# Patient Record
Sex: Female | Born: 1950 | Race: Asian | Hispanic: No | Marital: Married | Smoking: Never smoker
Health system: Southern US, Community
[De-identification: ages and names within clinical notes are randomized; demographics above are authoritative.]

## PROBLEM LIST (undated history)

## (undated) DIAGNOSIS — I272 Pulmonary hypertension, unspecified: Secondary | ICD-10-CM

## (undated) DIAGNOSIS — I2510 Atherosclerotic heart disease of native coronary artery without angina pectoris: Secondary | ICD-10-CM

## (undated) DIAGNOSIS — IMO0002 Reserved for concepts with insufficient information to code with codable children: Secondary | ICD-10-CM

## (undated) DIAGNOSIS — I5022 Chronic systolic (congestive) heart failure: Secondary | ICD-10-CM

## (undated) DIAGNOSIS — I10 Essential (primary) hypertension: Secondary | ICD-10-CM

## (undated) DIAGNOSIS — E1142 Type 2 diabetes mellitus with diabetic polyneuropathy: Secondary | ICD-10-CM

## (undated) DIAGNOSIS — D696 Thrombocytopenia, unspecified: Secondary | ICD-10-CM

## (undated) DIAGNOSIS — D649 Anemia, unspecified: Secondary | ICD-10-CM

## (undated) DIAGNOSIS — R609 Edema, unspecified: Secondary | ICD-10-CM

## (undated) DIAGNOSIS — I38 Endocarditis, valve unspecified: Secondary | ICD-10-CM

## (undated) DIAGNOSIS — R188 Other ascites: Secondary | ICD-10-CM

## (undated) DIAGNOSIS — I509 Heart failure, unspecified: Secondary | ICD-10-CM

## (undated) DIAGNOSIS — E46 Unspecified protein-calorie malnutrition: Secondary | ICD-10-CM

## (undated) DIAGNOSIS — E1165 Type 2 diabetes mellitus with hyperglycemia: Secondary | ICD-10-CM

## (undated) DIAGNOSIS — I472 Ventricular tachycardia: Secondary | ICD-10-CM

## (undated) DIAGNOSIS — N390 Urinary tract infection, site not specified: Secondary | ICD-10-CM

## (undated) DIAGNOSIS — N184 Chronic kidney disease, stage 4 (severe): Secondary | ICD-10-CM

## (undated) DIAGNOSIS — Z659 Problem related to unspecified psychosocial circumstances: Secondary | ICD-10-CM

## (undated) DIAGNOSIS — Z609 Problem related to social environment, unspecified: Secondary | ICD-10-CM

## (undated) DIAGNOSIS — N189 Chronic kidney disease, unspecified: Secondary | ICD-10-CM

## (undated) DIAGNOSIS — I255 Ischemic cardiomyopathy: Secondary | ICD-10-CM

## (undated) DIAGNOSIS — J189 Pneumonia, unspecified organism: Secondary | ICD-10-CM

## (undated) HISTORY — PX: TUBAL LIGATION: SHX77

---

## 2011-04-10 ENCOUNTER — Emergency Department (HOSPITAL_COMMUNITY)
Admission: EM | Admit: 2011-04-10 | Discharge: 2011-04-10 | Disposition: A | Discharge status: Home / Self Care | Payer: Self-pay | Attending: Emergency Medicine | Admitting: Emergency Medicine

## 2011-04-10 ENCOUNTER — Encounter (HOSPITAL_COMMUNITY): Payer: Self-pay | Admitting: Emergency Medicine

## 2011-04-10 ENCOUNTER — Emergency Department (HOSPITAL_COMMUNITY): Payer: Self-pay

## 2011-04-10 DIAGNOSIS — E119 Type 2 diabetes mellitus without complications: Secondary | ICD-10-CM | POA: Insufficient documentation

## 2011-04-10 DIAGNOSIS — J40 Bronchitis, not specified as acute or chronic: Secondary | ICD-10-CM | POA: Insufficient documentation

## 2011-04-10 DIAGNOSIS — B349 Viral infection, unspecified: Secondary | ICD-10-CM

## 2011-04-10 DIAGNOSIS — R05 Cough: Secondary | ICD-10-CM | POA: Insufficient documentation

## 2011-04-10 DIAGNOSIS — R739 Hyperglycemia, unspecified: Secondary | ICD-10-CM

## 2011-04-10 DIAGNOSIS — B9789 Other viral agents as the cause of diseases classified elsewhere: Secondary | ICD-10-CM | POA: Insufficient documentation

## 2011-04-10 DIAGNOSIS — R059 Cough, unspecified: Secondary | ICD-10-CM | POA: Insufficient documentation

## 2011-04-10 DIAGNOSIS — R0602 Shortness of breath: Secondary | ICD-10-CM | POA: Insufficient documentation

## 2011-04-10 DIAGNOSIS — Z794 Long term (current) use of insulin: Secondary | ICD-10-CM | POA: Insufficient documentation

## 2011-04-10 HISTORY — DX: Essential (primary) hypertension: I10

## 2011-04-10 LAB — DIFFERENTIAL
Basophils Absolute: 0.1 10*3/uL (ref 0.0–0.1)
Eosinophils Absolute: 0.1 10*3/uL (ref 0.0–0.7)
Monocytes Absolute: 0.5 10*3/uL (ref 0.1–1.0)
Neutrophils Relative %: 74 % (ref 43–77)

## 2011-04-10 LAB — POCT I-STAT, CHEM 8
Creatinine, Ser: 0.9 mg/dL (ref 0.50–1.10)
HCT: 44 % (ref 36.0–46.0)
Hemoglobin: 15 g/dL (ref 12.0–15.0)
Sodium: 132 mEq/L — ABNORMAL LOW (ref 135–145)
TCO2: 25 mmol/L (ref 0–100)

## 2011-04-10 LAB — CBC
MCH: 29.7 pg (ref 26.0–34.0)
MCHC: 34.9 g/dL (ref 30.0–36.0)
Platelets: 217 10*3/uL (ref 150–400)
RDW: 12 % (ref 11.5–15.5)

## 2011-04-10 LAB — GLUCOSE, CAPILLARY
Glucose-Capillary: 302 mg/dL — ABNORMAL HIGH (ref 70–99)
Glucose-Capillary: 256 mg/dL — ABNORMAL HIGH (ref 70–99)

## 2011-04-10 MED ORDER — METFORMIN HCL 1000 MG PO TABS: 500.0000 mg | ORAL_TABLET | Freq: Every day | ORAL | Status: DC

## 2011-04-10 MED ORDER — ONDANSETRON 4 MG PO TBDP
4.0000 mg | ORAL_TABLET | Freq: Once | ORAL | Status: AC
  Administered 2011-04-10: 4 mg via ORAL
  Filled 2011-04-10: qty 1

## 2011-04-10 MED ORDER — SODIUM CHLORIDE 0.9 % IV SOLN
INTRAVENOUS | Status: DC
  Administered 2011-04-10: 100 mL via INTRAVENOUS

## 2011-04-10 MED ORDER — DM-GUAIFENESIN ER 30-600 MG PO TB12
1.0000 | ORAL_TABLET | Freq: Two times a day (BID) | ORAL | Status: DC
  Administered 2011-04-10 (×2): 1 via ORAL
  Filled 2011-04-10 (×4): qty 1

## 2011-04-10 MED ORDER — IBUPROFEN 200 MG PO TABS
400.0000 mg | ORAL_TABLET | Freq: Once | ORAL | Status: AC
  Administered 2011-04-10: 400 mg via ORAL
  Filled 2011-04-10: qty 2

## 2011-04-10 MED ORDER — SODIUM CHLORIDE 0.9 % IV BOLUS (SEPSIS)
750.0000 mL | INTRAVENOUS | Status: AC
  Administered 2011-04-10: 750 mL via INTRAVENOUS

## 2011-04-10 MED ORDER — INSULIN ASPART 100 UNIT/ML ~~LOC~~ SOLN
8.0000 [IU] | Freq: Once | SUBCUTANEOUS | Status: AC
  Administered 2011-04-10: 8 [IU] via SUBCUTANEOUS
  Filled 2011-04-10: qty 1

## 2011-04-10 NOTE — ED Notes (Signed)
Patient returned from X-ray 

## 2011-04-10 NOTE — ED Notes (Signed)
Pt presented to the ER with c/o shortness of breath, generalize body aches, pt states s/s started couple of days ago. Pt with hx of HTN, not on any medications, states using diet to gain control.

## 2011-04-10 NOTE — ED Provider Notes (Cosign Needed)
History     CSN: [id number]  Arrival date & time 04/10/11  0348   First MD Initiated Contact with Patient 04/10/11 [phone]      Chief Complaint  Patient presents with  . Shortness of Breath  . Cough  . Generalized Body Aches   Patient's son interprets, patient has limited English  (Consider location/radiation/quality/duration/timing/severity/associated sxs/prior treatment) HPI  Patient relates 2 days ago she started having a burning all over her body. She has had a cough with green mucus. She states her chest hurts when she coughs. She also feels short of breath. She denies fever or shortness of breath. She's had nausea and also had some posttussive vomiting. She denies diarrhea. They report her grandson has had similar symptoms recently.  PCP none  Past Medical History  Diagnosis Date  . Diabetes mellitus   . Hypertension     Past Surgical History  Procedure Date  . Cesarean section     History reviewed. No pertinent family history.  History  Substance Use Topics  . Smoking status: Never Smoker   . Smokeless tobacco: Not on file  . Alcohol Use: Yes   lives with spouse  OB History    Grav Para Term Preterm Abortions TAB SAB Ect Mult Living                  Review of Systems  All other systems reviewed and are negative.    Allergies  Review of patient's allergies indicates no known allergies.  Home Medications   Current Outpatient Rx  Name Route Sig Dispense Refill  . METFORMIN HCL 1000 MG PO TABS Oral Take 0.5 tablets (500 mg total) by mouth daily with breakfast. 30 tablet 0    BP 183/89  Pulse 86  Temp(Src) 97.8 F (37.7 C) (Oral)  Resp 18  SpO2 98%  Physical Exam  Nursing note and vitals reviewed. Constitutional: She is oriented to person, place, and time. She appears well-developed and well-nourished.  Non-toxic appearance. She does not appear ill. No distress.       Patient looks like she feels bad she has cough at times  HENT:  Head:  Normocephalic and atraumatic.  Right Ear: External ear normal.  Left Ear: External ear normal.  Nose: Nose normal. No mucosal edema or rhinorrhea.  Mouth/Throat: Oropharynx is clear and moist and mucous membranes are normal. No dental abscesses or uvula swelling.  Eyes: Conjunctivae and EOM are normal. Pupils are equal, round, and reactive to light.  Neck: Normal range of motion and full passive range of motion without pain. Neck supple.  Cardiovascular: Normal rate, regular rhythm and normal heart sounds.  Exam reveals no gallop and no friction rub.   No murmur heard. Pulmonary/Chest: Effort normal and breath sounds normal. No respiratory distress. She has no wheezes. She has no rhonchi. She has no rales. She exhibits no tenderness and no crepitus.  Abdominal: Soft. Normal appearance and bowel sounds are normal. She exhibits no distension. There is no tenderness. There is no rebound and no guarding.  Musculoskeletal: Normal range of motion. She exhibits no edema and no tenderness.       Moves all extremities well.   Neurological: She is alert and oriented to person, place, and time. She has normal strength. No cranial nerve deficit.  Skin: Skin is warm, dry and intact. No rash noted. No erythema. No pallor.  Psychiatric: Her speech is normal and behavior is normal. Her mood appears not anxious.  Flat affect    ED Course  Procedures (including critical care time)   Medications  metFORMIN (GLUCOPHAGE) 1000 MG tablet (not administered)  ondansetron (ZOFRAN-ODT) disintegrating tablet 4 mg (4 mg Oral Given 04/10/11 0520)  ibuprofen (ADVIL,MOTRIN) tablet 400 mg (400 mg Oral Given 04/10/11 0653)  sodium chloride 0.9 % bolus 750 mL (750 mL Intravenous Given 04/10/11 0804)  insulin aspart (novoLOG) injection 8 Units (8 Units Subcutaneous Given 04/10/11 1002)  sodium chloride 0.9 % bolus 750 mL (750 mL Intravenous Given 04/10/11 1001)   Patient reported feeling burning all over. Her temperature was  rechecked and had gone up to 99.8 her blood pressure improved to 153/75.  Pt was diagnosed with diabetes 2 years ago and has only been on diet control.   Pt given IV fluids and ISTAT 8 and CBC pending.   Turned over to Dr Freida Busman at change of shift to stabilize her blood glucose.    Results for orders placed during the hospital encounter of 04/10/11  GLUCOSE, CAPILLARY      Component Value Range   Glucose-Capillary 398 (*) 70 - 99 (mg/dL)   Comment 1 Documented in Chart     Comment 2 Notify RN    GLUCOSE, CAPILLARY      Component Value Range   Glucose-Capillary 407 (*) 70 - 99 (mg/dL)  CBC      Component Value Range   WBC 8.4  4.0 - 10.5 (K/uL)   RBC 4.08  3.87 - 5.11 (MIL/uL)   Hemoglobin 12.1  12.0 - 15.0 (g/dL)   HCT 62.1 (*) 30.8 - 46.0 (%)   MCV 85.0  78.0 - 100.0 (fL)   MCH 29.7  26.0 - 34.0 (pg)   MCHC 34.9  30.0 - 36.0 (g/dL)   RDW 65.7  84.6 - 96.2 (%)   Platelets 217  150 - 400 (K/uL)  DIFFERENTIAL      Component Value Range   Neutrophils Relative 74  43 - 77 (%)   Lymphocytes Relative 18  12 - 46 (%)   Monocytes Relative 6  3 - 12 (%)   Eosinophils Relative 1  0 - 5 (%)   Basophils Relative 1  0 - 1 (%)   Neutro Abs 6.2  1.7 - 7.7 (K/uL)   Lymphs Abs 1.5  0.7 - 4.0 (K/uL)   Monocytes Absolute 0.5  0.1 - 1.0 (K/uL)   Eosinophils Absolute 0.1  0.0 - 0.7 (K/uL)   Basophils Absolute 0.1  0.0 - 0.1 (K/uL)   WBC Morphology TOXIC GRANULATION    POCT I-STAT, CHEM 8      Component Value Range   Sodium 132 (*) 135 - 145 (mEq/L)   Potassium 4.8  3.5 - 5.1 (mEq/L)   Chloride 100  96 - 112 (mEq/L)   BUN 18  6 - 23 (mg/dL)   Creatinine, Ser 9.52  0.50 - 1.10 (mg/dL)   Glucose, Bld 841 (*) 70 - 99 (mg/dL)   Calcium, Ion 3.24  4.01 - 1.32 (mmol/L)   TCO2 25  0 - 100 (mmol/L)   Hemoglobin 15.0  12.0 - 15.0 (g/dL)   HCT 02.7  25.3 - 66.4 (%)  GLUCOSE, CAPILLARY      Component Value Range   Glucose-Capillary 302 (*) 70 - 99 (mg/dL)  GLUCOSE, CAPILLARY      Component  Value Range   Glucose-Capillary 256 (*) 70 - 99 (mg/dL)   Dg Chest 2 View  4/0/3474  *RADIOLOGY REPORT*  Clinical Data: Cough.  CHEST - 2 VIEW  Comparison: None.  Findings: The lungs are well-aerated and clear.  There is no evidence of focal opacification, pleural effusion or pneumothorax.  The heart is borderline normal in size; the mediastinal contour is within normal limits.  No acute osseous abnormalities are seen.  IMPRESSION: No acute cardiopulmonary process seen.  Original Report Authenticated By: Tonia Ghent, M.D.      1. Bronchitis   2. Viral illness   3. Hyperglycemia     Plan probable discharge by Dr Freida Busman after stabilizing her serum glucose  New Prescriptions   METFORMIN (GLUCOPHAGE) 1000 MG TABLET    Take 0.5 tablets (500 mg total) by mouth daily with breakfast.    Devoria Albe, MD, FACEP   MDM          Ward Givens, MD 04/10/11 1800

## 2011-04-10 NOTE — ED Provider Notes (Signed)
Pt signed out to me by dr. Lynelle Doctor, pt given insulin and iv fluids, repeat cbgs show improvement, will start meds for hyperglycemia and she has f/u at [facility]  Toy Baker, MD 04/10/11 1137

## 2011-04-10 NOTE — ED Notes (Signed)
Pt given fluid 

## 2012-08-07 DIAGNOSIS — I509 Heart failure, unspecified: Secondary | ICD-10-CM

## 2012-08-07 DIAGNOSIS — D649 Anemia, unspecified: Secondary | ICD-10-CM

## 2012-08-07 HISTORY — DX: Heart failure, unspecified: I50.9

## 2012-08-07 HISTORY — DX: Anemia, unspecified: D64.9

## 2012-08-22 ENCOUNTER — Emergency Department (HOSPITAL_COMMUNITY): Payer: Self-pay

## 2012-08-22 ENCOUNTER — Inpatient Hospital Stay (HOSPITAL_COMMUNITY)
Admission: EM | Admit: 2012-08-22 | Discharge: 2012-08-23 | DRG: 292 | Disposition: A | Discharge status: Home / Self Care | Payer: Medicaid Other | Attending: Internal Medicine | Admitting: Internal Medicine

## 2012-08-22 ENCOUNTER — Encounter (HOSPITAL_COMMUNITY): Payer: Self-pay | Admitting: Emergency Medicine

## 2012-08-22 DIAGNOSIS — Z9119 Patient's noncompliance with other medical treatment and regimen: Secondary | ICD-10-CM

## 2012-08-22 DIAGNOSIS — E1142 Type 2 diabetes mellitus with diabetic polyneuropathy: Secondary | ICD-10-CM | POA: Diagnosis present

## 2012-08-22 DIAGNOSIS — I509 Heart failure, unspecified: Secondary | ICD-10-CM

## 2012-08-22 DIAGNOSIS — T502X5A Adverse effect of carbonic-anhydrase inhibitors, benzothiadiazides and other diuretics, initial encounter: Secondary | ICD-10-CM | POA: Diagnosis present

## 2012-08-22 DIAGNOSIS — D649 Anemia, unspecified: Secondary | ICD-10-CM | POA: Diagnosis present

## 2012-08-22 DIAGNOSIS — E875 Hyperkalemia: Secondary | ICD-10-CM | POA: Diagnosis present

## 2012-08-22 DIAGNOSIS — I059 Rheumatic mitral valve disease, unspecified: Secondary | ICD-10-CM

## 2012-08-22 DIAGNOSIS — I5033 Acute on chronic diastolic (congestive) heart failure: Principal | ICD-10-CM | POA: Diagnosis present

## 2012-08-22 DIAGNOSIS — E1149 Type 2 diabetes mellitus with other diabetic neurological complication: Secondary | ICD-10-CM | POA: Diagnosis present

## 2012-08-22 DIAGNOSIS — D638 Anemia in other chronic diseases classified elsewhere: Secondary | ICD-10-CM | POA: Diagnosis present

## 2012-08-22 DIAGNOSIS — A498 Other bacterial infections of unspecified site: Secondary | ICD-10-CM | POA: Diagnosis present

## 2012-08-22 DIAGNOSIS — R319 Hematuria, unspecified: Secondary | ICD-10-CM | POA: Diagnosis present

## 2012-08-22 DIAGNOSIS — E119 Type 2 diabetes mellitus without complications: Secondary | ICD-10-CM | POA: Diagnosis present

## 2012-08-22 DIAGNOSIS — N179 Acute kidney failure, unspecified: Secondary | ICD-10-CM

## 2012-08-22 DIAGNOSIS — Z91199 Patient's noncompliance with other medical treatment and regimen due to unspecified reason: Secondary | ICD-10-CM

## 2012-08-22 DIAGNOSIS — N390 Urinary tract infection, site not specified: Secondary | ICD-10-CM

## 2012-08-22 DIAGNOSIS — I10 Essential (primary) hypertension: Secondary | ICD-10-CM | POA: Diagnosis present

## 2012-08-22 HISTORY — DX: Type 2 diabetes mellitus with diabetic polyneuropathy: E11.42

## 2012-08-22 HISTORY — DX: Heart failure, unspecified: I50.9

## 2012-08-22 HISTORY — DX: Urinary tract infection, site not specified: N39.0

## 2012-08-22 HISTORY — DX: Pneumonia, unspecified organism: J18.9

## 2012-08-22 HISTORY — DX: Anemia, unspecified: D64.9

## 2012-08-22 LAB — RETICULOCYTES
Retic Count, Absolute: 95.7 10*3/uL (ref 19.0–186.0)
Retic Ct Pct: 3.3 % — ABNORMAL HIGH (ref 0.4–3.1)

## 2012-08-22 LAB — TROPONIN I: Troponin I: <0.3 ng/mL (ref <0.30)

## 2012-08-22 LAB — FERRITIN: Ferritin: 242 ng/mL (ref 10–291)

## 2012-08-22 LAB — COMPREHENSIVE METABOLIC PANEL
BUN: 22 mg/dL (ref 6–23)
CO2: 23 mEq/L (ref 19–32)
Calcium: 9.4 mg/dL (ref 8.4–10.5)
Chloride: 104 mEq/L (ref 96–112)
Creatinine, Ser: 1.05 mg/dL (ref 0.50–1.10)
GFR calc non Af Amer: 56 mL/min — ABNORMAL LOW (ref >90)
Total Bilirubin: 0.4 mg/dL (ref 0.3–1.2)

## 2012-08-22 LAB — PRO B NATRIURETIC PEPTIDE: Pro B Natriuretic peptide (BNP): 6331 pg/mL — ABNORMAL HIGH (ref 0–125)

## 2012-08-22 LAB — GLUCOSE, CAPILLARY: Glucose-Capillary: 149 mg/dL — ABNORMAL HIGH (ref 70–99)

## 2012-08-22 LAB — CBC
HCT: 25.9 % — ABNORMAL LOW (ref 36.0–46.0)
Hemoglobin: 8.8 g/dL — ABNORMAL LOW (ref 12.0–15.0)
MCH: 29.9 pg (ref 26.0–34.0)
MCHC: 34 g/dL (ref 30.0–36.0)
MCV: 88.1 fL (ref 78.0–100.0)
Platelets: 188 10*3/uL (ref 150–400)
RBC: 2.94 MIL/uL — ABNORMAL LOW (ref 3.87–5.11)
RDW: 14.2 % (ref 11.5–15.5)
WBC: 4.5 10*3/uL (ref 4.0–10.5)

## 2012-08-22 LAB — POCT I-STAT, CHEM 8
Chloride: 109 mEq/L (ref 96–112)
Glucose, Bld: 114 mg/dL — ABNORMAL HIGH (ref 70–99)
HCT: 27 % — ABNORMAL LOW (ref 36.0–46.0)
Hemoglobin: 9.2 g/dL — ABNORMAL LOW (ref 12.0–15.0)
Potassium: 5.2 mEq/L — ABNORMAL HIGH (ref 3.5–5.1)

## 2012-08-22 LAB — URINALYSIS, ROUTINE W REFLEX MICROSCOPIC
Bilirubin Urine: NEGATIVE
Ketones, ur: NEGATIVE mg/dL
Nitrite: POSITIVE — AB
Specific Gravity, Urine: 1.01 (ref 1.005–1.030)
Urobilinogen, UA: 0.2 mg/dL (ref 0.0–1.0)

## 2012-08-22 LAB — SODIUM, URINE, RANDOM: Sodium, Ur: 68 mEq/L

## 2012-08-22 LAB — OCCULT BLOOD, POC DEVICE: Fecal Occult Bld: NEGATIVE

## 2012-08-22 LAB — MAGNESIUM: Magnesium: 1.9 mg/dL (ref 1.5–2.5)

## 2012-08-22 LAB — PHOSPHORUS: Phosphorus: 4.9 mg/dL — ABNORMAL HIGH (ref 2.3–4.6)

## 2012-08-22 LAB — IRON AND TIBC: Iron: 47 ug/dL (ref 42–135)

## 2012-08-22 LAB — VITAMIN B12: Vitamin B-12: 453 pg/mL (ref 211–911)

## 2012-08-22 MED ORDER — DEXTROSE 5 % IV SOLN
1.0000 g | Freq: Once | INTRAVENOUS | Status: AC
  Administered 2012-08-22: 1 g via INTRAVENOUS
  Filled 2012-08-22: qty 10

## 2012-08-22 MED ORDER — ACETAMINOPHEN 650 MG RE SUPP: 650.0000 mg | Freq: Four times a day (QID) | RECTAL | Status: DC | PRN

## 2012-08-22 MED ORDER — ACETAMINOPHEN 325 MG PO TABS: 650.0000 mg | ORAL_TABLET | Freq: Four times a day (QID) | ORAL | Status: DC | PRN

## 2012-08-22 MED ORDER — CEFTRIAXONE SODIUM 1 G IJ SOLR: 1.0000 g | Freq: Once | INTRAMUSCULAR | Status: DC

## 2012-08-22 MED ORDER — ONDANSETRON HCL 4 MG/2ML IJ SOLN: 4.0000 mg | Freq: Four times a day (QID) | INTRAMUSCULAR | Status: DC | PRN

## 2012-08-22 MED ORDER — ONDANSETRON HCL 4 MG PO TABS: 4.0000 mg | ORAL_TABLET | Freq: Four times a day (QID) | ORAL | Status: DC | PRN

## 2012-08-22 MED ORDER — AMLODIPINE BESYLATE 5 MG PO TABS
5.0000 mg | ORAL_TABLET | Freq: Every day | ORAL | Status: DC
  Administered 2012-08-22 – 2012-08-23 (×2): 5 mg via ORAL
  Filled 2012-08-22 (×2): qty 1

## 2012-08-22 MED ORDER — HYDRALAZINE HCL 20 MG/ML IJ SOLN
5.0000 mg | Freq: Once | INTRAMUSCULAR | Status: AC
  Administered 2012-08-22: 5 mg via INTRAVENOUS
  Filled 2012-08-22: qty 1

## 2012-08-22 MED ORDER — INSULIN ASPART 100 UNIT/ML ~~LOC~~ SOLN: 0.0000 [IU] | Freq: Three times a day (TID) | SUBCUTANEOUS | Status: DC

## 2012-08-22 MED ORDER — FUROSEMIDE 10 MG/ML IJ SOLN
40.0000 mg | Freq: Once | INTRAMUSCULAR | Status: AC
  Administered 2012-08-22: 40 mg via INTRAVENOUS
  Filled 2012-08-22: qty 4

## 2012-08-22 MED ORDER — CIPROFLOXACIN HCL 500 MG PO TABS
500.0000 mg | ORAL_TABLET | Freq: Two times a day (BID) | ORAL | Status: DC
  Administered 2012-08-23: 500 mg via ORAL
  Filled 2012-08-22 (×3): qty 1

## 2012-08-22 MED ORDER — FUROSEMIDE 40 MG PO TABS
40.0000 mg | ORAL_TABLET | Freq: Two times a day (BID) | ORAL | Status: DC
  Administered 2012-08-22: 40 mg via ORAL
  Filled 2012-08-22 (×4): qty 1

## 2012-08-22 MED ORDER — ENOXAPARIN SODIUM 40 MG/0.4ML ~~LOC~~ SOLN
40.0000 mg | SUBCUTANEOUS | Status: DC
  Administered 2012-08-22: 40 mg via SUBCUTANEOUS
  Filled 2012-08-22 (×2): qty 0.4

## 2012-08-22 MED ORDER — HYDRALAZINE HCL 20 MG/ML IJ SOLN
10.0000 mg | INTRAMUSCULAR | Status: DC | PRN
  Administered 2012-08-22: 10 mg via INTRAVENOUS
  Filled 2012-08-22: qty 1

## 2012-08-22 MED ORDER — SODIUM CHLORIDE 0.9 % IJ SOLN
3.0000 mL | Freq: Two times a day (BID) | INTRAMUSCULAR | Status: DC
  Administered 2012-08-22: 3 mL via INTRAVENOUS

## 2012-08-22 NOTE — ED Notes (Signed)
PA Gershon Mussel at bedside for hemoccult.

## 2012-08-22 NOTE — ED Notes (Signed)
Internal medicine consult at bedside.

## 2012-08-22 NOTE — ED Notes (Signed)
Late entry: PA Gershon Mussel made aware of pt's BP.

## 2012-08-22 NOTE — ED Notes (Signed)
MD at bedside. 

## 2012-08-22 NOTE — Consult Note (Signed)
CARDIOLOGY CONSULT NOTE  Patient ID: [name] [name], MRN: [id number], DOB/AGE: [birth year] 62 y.o. Admit date: 08/22/2012   Date of Consult: 08/22/2012 Primary Physician: No primary provider on file. Primary Cardiologist: New to [place], being seen by Dr. Elease Hashimoto  Chief Complaint: SOB and cough Reason for Consult: ?CHF  HPI: [name] [name] is a 62 y/o [place] F with a h/o DM and HTN and no prior cardiac history who presents today c/o worsened SOB and cough x 2 weeks. Much of history is provided by patient's daughter due to language barrier; information provided by patient was translated by her adult daughter, which patient consented to.   Her symptoms date back to last year, but have been worse over the last 2 weeks. The patient was seen in ED on 04/2011 c/o productive cough, SOB, and posttussive vomiting. During this visit, DM and HTN were addressed as she was started on Metformin and Lisinopril. Her respiratory symptoms did not improve so soon after the ED visit she went to urgent care and was diagnosed with pneumonia. Apparently since this time she has had intermittent SOB with cough and has experienced difficulty performing ADLs such as grocery shopping dt fatigue and SOB. She continues to take Metformin which daughter pays for, but has not taken Lisinopril in at least 1 year due to financial status. For the past 2 months, she has noted mild LE edema R>L after standing or walking for a period of time. For the last 2 weeks, her SOB and cough have worsened and become more persistent. Walking increases SOB and rest alleviates dyspnea. For past 1 week, patient has had orthopnea. Does not weigh herself at home. Denies chest pain, palpitations, PND, dizziness, abdominal pain, n/v, hematuria, dark urine, or dysuria. Attests to frequent urination (Q 1hr) with only small amount of urine voided, but this is unchanged in past year. Also reports vision changes and sensitivity to light, which daughter attributes to diabetes.  She has not been able to afford to see ophthalmologist or endocrinologist.   Past Medical History  Diagnosis Date  . Diabetes mellitus     Reportedly diagnosed 2011 but no medication initiated until 04/2011 (Metformin)  . Hypertension   . Pneumonia   . Diabetic peripheral neuropathy     Since 2013      Most Recent Cardiac Studies: None   Surgical History:  Past Surgical History  Procedure Laterality Date  . Cesarean section       Home Meds: Prior to Admission medications   Medication Sig Start Date End Date Taking? Authorizing Provider  metFORMIN (GLUCOPHAGE) 1000 MG tablet Take 0.5 tablets (500 mg total) by mouth daily with breakfast. 04/10/11 08/22/12 Yes Toy Baker, MD  OVER THE COUNTER MEDICATION Take 1 tablet by mouth 2 (two) times daily. One a day vitamin for diabetics   Yes Historical Provider, MD  OVER THE COUNTER MEDICATION Take 15 mLs by mouth daily as needed (cough). Over the counter cough syrup   Yes Historical Provider, MD    Inpatient Medications:     Allergies: No Known Allergies  History   Social History  . Marital Status: Married    Spouse Name: N/A    Number of Children: N/A  . Years of Education: N/A   Occupational History  . Not on file.   Social History Main Topics  . Smoking status: Never Smoker   . Smokeless tobacco: Not on file  . Alcohol Use: No  . Drug Use: No  . Sexually Active:  Not on file   Other Topics Concern  . Not on file   Social History Narrative   Patient lives with husband and son. Currently without insurance and therefore tries not seek medical care dt financial concern.      Family History  Problem Relation Age of Onset  . Diabetes Brother     deceased in 20s dt DM complications   Review of Systems: General: negative for chills, fever.  Cardiovascular: +++ orthopnea and SOB. negative for chest pain, palpitations, paroxysmal nocturnal dyspnea. Respiratory: +++ cough. Urologic: negative for hematuria or  dysuria. Abdominal: negative for nausea, vomiting, diarrhea, bright red blood per rectum, melena. Neurologic: negative for syncope or dizziness. All other systems reviewed and are otherwise negative except as noted above.  Labs:  Recent Labs  08/22/12 0930  TROPONINI <0.30   Pro-BNP: 6331.0  Lab Results  Component Value Date   WBC 4.5 08/22/2012   HGB 8.8* 08/22/2012   HCT 25.9* 08/22/2012   MCV 88.1 08/22/2012   PLT 188 08/22/2012     Recent Labs Lab 08/22/12 0917  NA 141  K 5.2*  CL 109  BUN 23  CREATININE 1.30*  GLUCOSE 114*   Radiology/Studies:  Dg Chest 2 View  08/22/2012   *RADIOLOGY REPORT*  Clinical Data: Shortness of breath.  CHEST - 2 VIEW  Comparison: 04/10/2011  Findings: There is evidence of mild acute interstitial edema without significant pleural effusion.  The heart is mildly enlarged.  No focal pulmonary consolidation is seen.  IMPRESSION: Interstitial edema.   Original Report Authenticated By: Irish Lack, M.D.    EKG: NSR 72bpm minimal ST depression II, V5-V6 no prior to compare to  Physical Exam: Blood pressure 209/79, pulse 67, temperature 98.3 F (36.8 C), temperature source Oral, resp. rate 13, height 5' (1.524 m), weight 124 lb (56.246 kg), SpO2 97.00%. General: Well developed, well nourished thin asian F in no acute distress. Head: Normocephalic, atraumatic. Neck: Negative for carotid bruits. Mild JVD. Lungs: Diminished breath sounds at bases with bibasilar rales initially but after lasix has no rales. No wheezing or rhonchi. Breathing is unlabored. Heart: RRR with S1 S2. No murmurs, rubs, or gallops appreciated. Abdomen: Soft, non-tender, non-distended with normoactive bowel sounds. Extremities: No clubbing or cyanosis. No edema.  Distal pedal pulses are 1+ and equal bilaterally. Neuro: Alert and oriented X 3. No facial asymmetry. No focal deficit.  Psych:  Responds to questions appropriately (with daughter translating) with a normal affect.       Assessment and Plan:  1. Acute heart failure, type unknown: Patient to be admitted by internal medicine and cardiology will continue to consult. Suspect dyspnea is multifactorial with anemia also playing a role. Plan echo and improved blood pressure control (?diastolic CHF). She already got 1 dose of IV Lasix in the ER and will follow clinically before dosing additional diuretic. May need ischemic workup depending on echo results. Recommend strict I&O's, daily weights, TSH. Address hypertension with addition of Norvasc today. Avoid beta blocker at present given HR in the 60s - it is actually surprising that she is NOT tachycardic in the setting of her anemia/CHF.  2. HTN: Untreated and likely long-standing. Monitor BP trend after Lasix initiated. Would avoid ACEI acutely given elevated creatinine and hyperkalemia. Add Norvasc as above.  3. Acute anemia: Hgb: 8.8 (was 15g/dL on 02/13/08). Hemoccult negative but moderate Hgb on UA. May be contributing to increased SOB. Anemia panel pending. Consider blood transfusion. Internal medicine to follow. Surprisingly she is  not tachycardic with this, so question slower loss.  4. Acute renal insufficiency: Creatinine 1.3. Watch with diuresis. Further w/u per IM given abnormal UA with proteinuria and hematuria.  5. Hyperkalemia: Potassium 5.2 (was 4.8 on 04/10/11). No peaked T waves on EKG. This will likely correct with diuresis via Lasix. Plan to reassess with BMET. Not sure she is a candidate for ACEI in the future as prior K was also on high range.  6. DM: Appears well-controlled, especially considering glucose on CMET was 433 on last hospital visit 04/10/2011. Daughter reports BG levels at home are 100s-120s. CBG this morning 114. Consider discontinuing Metformin acutely while renal insufficiency is present. Recommend sliding-scale insulin.    7. Abnormal UA: Nitrite positive indicating UTI thus ceftriaxone initiated. Pt also has significant proteinuria as  well as hematuria. Further workup per IM.   Signed, Nyra Jabs PA-S, Patient seen with Ronie Spies PA-C 08/22/2012, 3:39 PM  Attending Note:   The patient was seen and examined.  Agree with assessment and plan as noted above.  Changes made to the above note as needed.  The patient has had progressive DOE for the past year.  In addition, she had become significantly anemic.  She has had HTN for at least a year - I suspect much longer.    Will get the results of the echo.  I expect her to have diastolic dysfunction and possible some systolic dysfunction - she does not have an S3 on exam.    Continue with BP control.  I would not want to necessarily decrease her BP to the  120 range - I suspect she is more accustomed to the 180-200 range.  We should aim for a goal of 150 in the next day or so.    Vesta Mixer, Montez Hageman., MD, [facility] 08/22/2012, 5:38 PM

## 2012-08-22 NOTE — ED Provider Notes (Signed)
History    CSN: [id number] Arrival date & time 08/22/12  2956  First MD Initiated Contact with Patient 08/22/12 [phone]     Chief Complaint  Patient presents with  . Cough  . Shortness of Breath   (Consider location/radiation/quality/duration/timing/severity/associated sxs/prior Treatment) Patient is a 62 y.o. female presenting with cough and shortness of breath. The history is provided by the patient and a relative. The history is limited by a language barrier. Language interpreter used: adult daughter.  Cough Associated symptoms: no chest pain, no chills, no diaphoresis, no fever and no shortness of breath   Shortness of Breath Associated symptoms: cough   Associated symptoms: no chest pain, no diaphoresis, no fever and no vomiting    Pt is a 61yo female with hx of chronic intermittent cough BIB adult daughter who states she has c/o worsening cough over the past 2-3 days and worsening SOB, making sleep difficult at night, orthopnea.  Pt was seen at urgent care 1-29mo ago for same, given cough medicine which has not helped. Daughter states she was advised to try OTC cough syrup which also does not help.  Pt tried medicinal coining last night w/o relief.  Denies fever, nausea, vomiting or diarrhea.  Denies recent travel.  No known specific lung problems besides intermittent chronic cough.  Pt has never smoked.  Pt is diabetic and has hx of HTN.  Daughter states CBG is in 100s-120s.  Pt does not take BP meds.      Past Medical History  Diagnosis Date  . Diabetes mellitus   . Hypertension   . Pneumonia    Past Surgical History  Procedure Laterality Date  . Cesarean section     History reviewed. No pertinent family history. History  Substance Use Topics  . Smoking status: Never Smoker   . Smokeless tobacco: Not on file  . Alcohol Use: No   OB History   Grav Para Term Preterm Abortions TAB SAB Ect Mult Living                 Review of Systems  Constitutional: Negative for  fever, chills, diaphoresis and fatigue.  Respiratory: Positive for cough. Negative for shortness of breath.   Cardiovascular: Negative for chest pain and palpitations.  Gastrointestinal: Negative for nausea, vomiting and diarrhea.  All other systems reviewed and are negative.    Allergies  Review of patient's allergies indicates no known allergies.  Home Medications   Current Outpatient Rx  Name  Route  Sig  Dispense  Refill  . metFORMIN (GLUCOPHAGE) 1000 MG tablet   Oral   Take 0.5 tablets (500 mg total) by mouth daily with breakfast.   30 tablet   0   . OVER THE COUNTER MEDICATION   Oral   Take 1 tablet by mouth 2 (two) times daily. One a day vitamin for diabetics         . OVER THE COUNTER MEDICATION   Oral   Take 15 mLs by mouth daily as needed (cough). Over the counter cough syrup          BP 205/81  Pulse 69  Temp(Src) 98.2 F (36.8 C) (Oral)  Resp 19  Ht 5' (1.524 m)  Wt 124 lb (56.246 kg)  BMI 24.22 kg/m2  SpO2 97% Physical Exam  Nursing note and vitals reviewed. Constitutional: She appears well-developed and well-nourished.  Pt lying comfortably in exam bed. Appears mildly fatigued. NAD  HENT:  Head: Normocephalic and atraumatic.  Right Ear:  Hearing, tympanic membrane, external ear and ear canal normal.  Left Ear: Hearing, tympanic membrane, external ear and ear canal normal.  Nose: Nose normal.  Mouth/Throat: Uvula is midline, oropharynx is clear and moist and mucous membranes are normal. No oropharyngeal exudate, posterior oropharyngeal edema, posterior oropharyngeal erythema or tonsillar abscesses.  Eyes: Conjunctivae are normal. No scleral icterus.  Neck: Normal range of motion. Neck supple.  Cardiovascular: Normal rate, regular rhythm and normal heart sounds.   Pulmonary/Chest: Effort normal and breath sounds normal. No respiratory distress. She has no wheezes. She has no rales. She exhibits no tenderness.  Abdominal: Soft. Bowel sounds are  normal. She exhibits no distension and no mass. There is tenderness ( mild, diffuse). There is no rebound and no guarding.  Genitourinary: Rectum normal. Rectal exam shows no external hemorrhoid, no internal hemorrhoid, no fissure, no mass, no tenderness and anal tone normal. Guaiac negative stool.  Musculoskeletal: Normal range of motion.  Neurological: She is alert.  Skin: Skin is warm and dry.  Diffuse, splotchy erythema and hyerpigmentation on upper back (result of 'coining')     ED Course  Procedures (including critical care time) Labs Reviewed  CBC - Abnormal; Notable for the following:    RBC 2.94 (*)    Hemoglobin 8.8 (*)    HCT 25.9 (*)    All other components within normal limits  GLUCOSE, CAPILLARY - Abnormal; Notable for the following:    Glucose-Capillary 105 (*)    All other components within normal limits  PRO B NATRIURETIC PEPTIDE - Abnormal; Notable for the following:    Pro B Natriuretic peptide (BNP) 6331.0 (*)    All other components within normal limits  RETICULOCYTES - Abnormal; Notable for the following:    Retic Ct Pct 3.3 (*)    RBC. 2.90 (*)    All other components within normal limits  POCT I-STAT, CHEM 8 - Abnormal; Notable for the following:    Potassium 5.2 (*)    Creatinine, Ser 1.30 (*)    Glucose, Bld 114 (*)    Hemoglobin 9.2 (*)    HCT 27.0 (*)    All other components within normal limits  TROPONIN I  OCCULT BLOOD X 1 CARD TO LAB, STOOL  VITAMIN B12  FOLATE  IRON AND TIBC  FERRITIN  OCCULT BLOOD, POC DEVICE   Dg Chest 2 View  08/22/2012   *RADIOLOGY REPORT*  Clinical Data: Shortness of breath.  CHEST - 2 VIEW  Comparison: 04/10/2011  Findings: There is evidence of mild acute interstitial edema without significant pleural effusion.  The heart is mildly enlarged.  No focal pulmonary consolidation is seen.  IMPRESSION: Interstitial edema.   Original Report Authenticated By: Irish Lack, M.D.    Date: 08/22/2012  Rate: 72  Rhythm: normal  sinus rhythm  QRS Axis: normal  Intervals: normal  ST/T Wave abnormalities: normal  Conduction Disutrbances:none  Narrative Interpretation:   Old EKG Reviewed: none available   1. CHF (congestive heart failure), unspecified failure chronicity, unspecified type   2. Hypertension   3. Anemia     MDM  Pt has hx of chronic cough but also reported hx of pneumonia.  Hx not concerning for TB.  Will get CXR.  If negative, will refer to pulmonology due to chronic nature of cough, affecting pt's sleep.  Pt does have elevated BP but has hx of HTN and does not take medication.  CXR: interstitial edema.  Due to reported chronic nature of cough, believe pt needs  to f/u with cardiologist and/or pulmonologist.  Added labs for more thorough cardiac workup.     CBC: Hgb: 8.8 (was 15g/dL on 02/13/08), Hct 96.0, (was 44.0% on 04/10/11)  istat chem 8: potassium 5.2 (was 4.8 on 04/10/11) Troponin: 0.00 BNP: 6331.0 Glucose: 105 Hemoccult: neg   Special anemia labs: see above   Will consult cardiology to admit pt for new onset CHF, likely due to uncontrolled HTN and new onset anemia, placing more strain on pt's heart.  Pt does not have PCP or cardiologist.  Spoke with [facility] cardiology, Dr. Leonard Downing agreed to come see pt.    Pt is being admitted by [facility] cardiology for CHF  Junius Finner, PA-C 08/22/12 1240  Addendum: Pt still being evaluated by [facility] cardiology.  In meantime, was found to have UTI.  Will give 1g Rocephin.  [place] is requesting internal medicine admit pt.  Spoke with Dr. Manson Passey with teaching service who agreed to come see pt,  Agreed to admit.   Junius Finner, PA-C 08/22/12 [phone]

## 2012-08-22 NOTE — Progress Notes (Signed)
Patient transferred from ED via stretcher to room 4E05. Patient and daughter oriented to room and call bell system with the help of daughter translating english to [place]. Daughter states that patient can understand English but can not answer in [place]. Patient complains of no pain or discomfort, will continue to monitor to end of shift.

## 2012-08-22 NOTE — H&P (Signed)
Date: 08/22/2012               Patient Name:  [name] [name] MRN: [id number]  DOB: 09-09-1950 Age / Sex: 62 y.o., female   PCP: No primary provider on file.              Medical Service: Internal Medicine Teaching Service              Attending Physician: Dr. Rocco Serene, MD    First Contact: Dr. Darci Needle Pager: [phone]  Second Contact: Dr. Manson Passey  Pager: [phone]            After Hours (After 5p/  First Contact Pager: [phone]  weekends / holidays): Second Contact Pager: [phone]   Chief Complaint:   Shortness of breath  History of Present Illness:   [name] [name] is a 62 year old [place] female with a history of diabetes and uncontrolled hypertension, who presents with shortness of breath.  Per her daughter, [name] [name] reports progressively worsening, intermittent, dyspnea on exertion with an associated non productive cough, over the past year. She reports an acute decline over the past week, experiencing constant shortness of breath, which precipitated her admission today. Patient reports dsypnea occurs after 5 minutes of activity and is relieved by rest, significantly interfering with daily activities. The daughter confirms a noticeable decline. The dyspnea is worse when lying flat and patient admits to 2 pillow orthopnea as well as paroxysmal nocturnal dyspnea that wakes her up 2-3 times a night.  She reports minimal associated swelling in her feet and legs at the end of the day that resolves by morning.  [name] [name] denies any associated chest pain, sweating, dizziness or nausea/ vomiting or wheezing. She denies abdominal pain or acid reflux. She denies any problems with her sinuses, or post nasal drip, and reports having no allergies. She reports well controlled diabetes, but non compliance with her lisinopril due to financial constraints. Additionally, [name] [name] complains of increased frequency and hesitancy in urination for the past several months, but denies dysuria or hematuria.   On  admission [name] [name] was maintaining systolic pressures in the 200s, diastolic pressures in the 80s.CBC showed decreased Hb 8.8, Hct 25.9, and TIBC 235, with normal levels of iron, ferritin, and B12. BMET showed hyperkalemia K 5.1 and increase in creatinine Cr 1.3. Cardiac workup showed elevated BNP 6331 and EKG with non specific ST depression in leads II, V5,and V6.CXR showed slight cardiac enlargement with evidence of mild interstitial edema in the lungs. Urinalysis was positive for nitrates, leukocyte esterase and 7-10 WBCs.    Meds: Current Facility-Administered Medications  Medication Dose Route Frequency Provider Last Rate Last Dose  . acetaminophen (TYLENOL) tablet 650 mg  650 mg Oral Q6H PRN Linward Headland, MD       Or  . acetaminophen (TYLENOL) suppository 650 mg  650 mg Rectal Q6H PRN Linward Headland, MD      . amLODipine (NORVASC) tablet 5 mg  5 mg Oral Daily Dayna N Dunn, PA-C      . [START ON 08/23/2012] ciprofloxacin (CIPRO) tablet 500 mg  500 mg Oral BID Linward Headland, MD      . enoxaparin (LOVENOX) injection 40 mg  40 mg Subcutaneous Q24H Linward Headland, MD      . hydrALAZINE (APRESOLINE) injection 10 mg  10 mg Intravenous Q4H PRN Linward Headland, MD      . Melene Muller ON 08/23/2012] insulin aspart (novoLOG) injection 0-9 Units  0-9  Units Subcutaneous TID WC Linward Headland, MD      . ondansetron [facility]) tablet 4 mg  4 mg Oral Q6H PRN Linward Headland, MD       Or  . ondansetron [facility]) injection 4 mg  4 mg Intravenous Q6H PRN Linward Headland, MD      . sodium chloride 0.9 % injection 3 mL  3 mL Intravenous Q12H Linward Headland, MD        Allergies: Allergies as of 08/22/2012  . (No Known Allergies)   Past Medical History  Diagnosis Date  . Diabetes mellitus     Reportedly diagnosed 2011 but no medication initiated until 04/2011 (Metformin)  . Hypertension   . Pneumonia   . Diabetic peripheral neuropathy     Since 2013  . CHF (congestive heart failure) 08/2012  . Anemia 08/2012  . UTI (urinary tract  infection) 08/22/2012   Past Surgical History  Procedure Laterality Date  . Cesarean section     Family History  Problem Relation Age of Onset  . Diabetes Brother     deceased in 83s dt DM complications   History   Social History  . Marital Status: Married    Spouse Name: N/A    Number of Children: N/A  . Years of Education: N/A   Occupational History  . Not on file.   Social History Main Topics  . Smoking status: Never Smoker   . Smokeless tobacco: Never Used  . Alcohol Use: No  . Drug Use: No  . Sexually Active: Not on file   Other Topics Concern  . Not on file   Social History Narrative   Patient lives with husband and son. Currently without insurance and therefore tries not seek medical care dt financial concern.     Review of Systems: As per HPI, in addition to  General: decreased appetite and weight over the past year, fatigue and weakness  Head: headache associated with shortness of breath and cough Eyes: changes in vision over the past year, blurriness, mild photosensitivity, no pain or itching Ears: no changes in hearing, objective tinnitus, no vertigo  Nose, sinuses: no rhinorrhea, stuffiness, sneezing or allergy  Cardiac: no chest pain, palpitations, as per HPI Pulmonary: as per HPI Neurologic: no epsiodes of syncope, reports tingling in her feet bilaterally, denies any numbness GI: reports some chronic diarrhea, otherwise no change in color, frequency, no blood in stool  Urinary: as per HPI  Physical Exam: Blood pressure 188/75, pulse 71, temperature 98.2 F (36.8 C), temperature source Oral, resp. rate 18, height 5' (1.524 m), weight 54.3 kg (119 lb 11.4 oz), SpO2 97.00%. General: well appearing, alert and engaged elderly woman, in no acute distress HEENT: pupils round and reactive to light, vision grossly intact, cataracts in both eyes, moist mucous membranes noted Neck:  Left carotid bruit, moderate JVD, JVP +8  Heart: regular rate and rhythm with  strong split S2,  non aortic-stenosis holosystolic murmur heard at the left sternal border, increasing with inspiration, no aortic insufficiency murmur, no gallops or rubs Lungs: mild crackles bilaterally in bases of the lung, normal work or respiration, no use of accessory muscles, no wheezing Abdomen: normal bowel sounds, soft, non tender, no rebound or guarding Extremities: strong bilateral lower extremity pulses, no bilateral edema, no cyanosis or clubbing Neurologic: alert and oriented to person, place and time, cranial nerves II-XII intact, strength and sensation grossly intact   Lab results: @LABTEST @  Imaging results:  Dg Chest  2 View  08/22/2012   *RADIOLOGY REPORT*  Clinical Data: Shortness of breath.  CHEST - 2 VIEW  Comparison: 04/10/2011  Findings: There is evidence of mild acute interstitial edema without significant pleural effusion.  The heart is mildly enlarged.  No focal pulmonary consolidation is seen.  IMPRESSION: Interstitial edema.   Original Report Authenticated By: Irish Lack, M.D.    Other results: EKG: unchanged from previous tracings.  Assessment & Plan by Problem:  [name] [name] is a 62 year old [place] female with a history of diabetes and uncontrolled hypertension, who presents with an acute exacerbation of progressive dyspnea on exertion, 2 pillow orthopnea, PND and associated cough.   #Acute exacerbation of CHF Patient presents with 1 year history of progressively worsening dyspnea on exertion, 2 pillow orthopnea, PND and associated cough, which has acutely worsened. On exam patient shows signs of volume overload such as peripheral edema, elevated JVD/ JVP, non aortic stenosis holosystolic flow murmur, and bilateral basilar crackles. Labs show significantly elevated BNP (6331) and evidence of pre renal azotemia (creat 1.3). CXR reveals mild interstitial edema with slight enlargement of the heart. Constellation of signs and symptoms are most consistent with an  acute exacerbation of CHF. CHF likely caused by history of uncontrolled hypertension however systolic versus diastolic dysfunction should be eluicidated. Other diagnoses on the differential include chronic lung disease, such as COPD, and asthma. Although these are both chronic and intermittent in nature, they are less likely due to progression of symptoms. Additionally, the patient does not have strong risk factors associated with such diagnoses. The patient is a never smoker and reports no environmental triggers associated with the episodes. Infectious cause, such as pneumonia, can be ruled out based on chronic nature of symptoms, absence of systemic symptoms and non productive cough. Based on the working diagnosis of acute exacerbation of CHF, the plan is as follows: - Echocardiogram to distinguish systolic vs diastolic dysfunction  -consult cardiology regarding new diagnosis of CHF  - Diuresis with lasix 40mg  IV once, lasix 40mg  PO bid  -Monitor daily weights for diuresis - Replete electrolytes  -Monitor labs, BMET, CBC, K, Mg - Monitor telemetry  # HTN Patient presents with BP of 220/83 on admission. Reported medication non compliance on lisinopril in addition to the patients volume overloaded status due acute CHF exacerbation are likely the contributing factors to the elevated pressure.  - Diuresis with lasix will likely decrease the pressure -Hydralazine 5mg  IV once with 10mg  hydralazine Q4 PRN for systolic pressure >180 -Amlodipine 5mg  PO daily as per cardiology recommendation   # Acute Kidney Injury Creatinine on admission was 1.30, BUN 23. In the context of volume overloaded status, these values indicate a pre renal azotemia, most likely a result of the acute exacerbation of CHF. Systolic or diastolic dysfunction decrease the cardiac output, which decreases the blood flow to the kidneys and therefore GFR, in turn increasing the BUN creatinine. As the patient is diuresed, we should see  decrease in creatinine until the patient becomes dry at which point the creatinine will trend back up.  -Diuresis with lasix should volume deplete and show a decrease -Monitor BMET, expect correction with   #Anemia   Decreased Hb 8.8 and Hct 25.9 on admission indicate significant anemia. Normal iron, ferritin, and transferrin rule out iron deficiency anemia. Anemia is most likely anemia of chronic disease due to the patients history of DM and worsening CHF.  - Continue to monitor CBC, expect hemoconcentration with diuresis   #Hyperkalemia  K on admission was 5.2. Most likely cause of the hyperkalemia was increase in the context of her acute kidney injury. Additionally, it could be a falsely elevated level due to hemolysis at the time of the blood draw.  - Monitor BMET, expect correction with correction of AKI  #Urinary tract infection  Patient reports increased frequency and hesitancy in urination, but denies dysuria and hematuria. UA was positive for nitrite and leukocytes, with WBC too numerous without squamous cell contamination. Due to symptomatic presentation, patient will be treated for UTI.  -1 dose ceftriaxone given in ED -Cipro 500mg  BID for 6 days     This is a Psychologist, occupational Note.  The care of the patient was discussed with Dr. Darci Needle and the assessment and plan was formulated with their assistance.  Please see their note for official documentation of the patient encounter.   Signed: Everardo All, Med Student 08/22/2012, 6:15 PM

## 2012-08-22 NOTE — ED Notes (Signed)
Family at bedside. 

## 2012-08-22 NOTE — ED Notes (Signed)
Patient transported to X-ray 

## 2012-08-22 NOTE — Progress Notes (Signed)
Patient at end of shift, had blood pressure of 202/72. Patient shakes head left to right to indicate "NO" when asked if she has a headache or feels dizzy. Hydralazine via intravenously administered per PRN order at 1904. Notified oncoming nurse and advise oncoming nurse to recheck blood pressure in an hour. If blood pressure continues to be above 180, reported and advised oncoming nurse to call the doctor. Will continue to monitor to end of shift. Nurse now signing off.

## 2012-08-22 NOTE — ED Notes (Signed)
Pt has been having a cough for the past year. Problem comes and goes. Been treated by urgent care with cough medicine.  Last time was seen with this similar problem was dx with PNA.  Shortness of breath at times.

## 2012-08-22 NOTE — H&P (Signed)
Date: 08/22/2012               Patient Name:  [name] [name] MRN: [id number]  DOB: August 11, 1950 Age / Sex: 62 y.o., female   PCP: No primary provider on file.         Medical Service: Internal Medicine Teaching Service         Attending Physician: Dr. Josem Kaufmann    First Contact: Dr. Darci Needle Pager: [phone]  Second Contact: Dr. Manson Passey Pager: [phone]       After Hours (After 5p/  First Contact Pager: [phone]  weekends / holidays): Second Contact Pager: [phone]   Chief Complaint: shortness of breath  History of Present Illness:  [name] [name] is a 62yo [place] woman w/ h/o HTN and DM who presents with worsening SOB over the past couple of weeks. Per daughter, patient has had intermittent episodes of DOE with nonproductive cough over the past year, but the current episode has lasted much longer than usual. Patient's SOB is worsened with lying flat and she endorses 2 pillow orthopnea as well as intermittent episodes of paroxysmal nocturnal dyspnea. Patient tires easily with daily activities and her SOB improves with rest. Patient has had a dry cough x 1 week as well as some BLE edema recently. Patient c/o increased urinary frequency and hesitancy, but denies dysuria or hematuria. She reports having some diarrhea, but notes this is chronic (she thinks due to her metformin), denies blood in her stool. Denies abdominal pain, CP. Daughter thinks patient used to be on lisinopril for her HTN, but has not been taking this or any other antihypertensive for over a year since she is uninsured.  In the ED, patient had BMet showing K of 5.1, Cr 1.3, CBC with Hb 8.8, negative troponin, dirty UA with 7-10 WBC/hpf, large leukocyte esterase, +nitrites. BNP 6331 w/ CXR showing mild interstitial edema and enlarged heart. FOBT negative. EKG NSR minimal ST depression in lead II, V5-V6 no prior to compare to.   Meds: No current facility-administered medications on file prior to encounter.   Current Outpatient Prescriptions on  File Prior to Encounter  Medication Sig Dispense Refill  . metFORMIN (GLUCOPHAGE) 1000 MG tablet Take 0.5 tablets (500 mg total) by mouth daily with breakfast.  30 tablet  0   Allergies: Allergies as of 08/22/2012  . (No Known Allergies)   Past Medical History  Diagnosis Date  . Diabetes mellitus     Reportedly diagnosed 2011 but no medication initiated until 04/2011 (Metformin)  . Hypertension   . Pneumonia   . Diabetic peripheral neuropathy     Since 2013  . CHF (congestive heart failure) 08/2012  . Anemia 08/2012  . UTI (urinary tract infection) 08/22/2012   Past Surgical History  Procedure Laterality Date  . Cesarean section     Family History  Problem Relation Age of Onset  . Diabetes Brother     deceased in 4s dt DM complications   History   Social History  . Marital Status: Married    Spouse Name: N/A    Number of Children: N/A  . Years of Education: N/A   Occupational History  . Not on file.   Social History Main Topics  . Smoking status: Never Smoker   . Smokeless tobacco: Never Used  . Alcohol Use: No  . Drug Use: No  . Sexually Active: Not on file   Other Topics Concern  . Not on file   Social History Narrative   Patient lives with  husband and son. Currently without insurance and therefore tries not seek medical care dt financial concern.     Review of Systems: General: no fevers, chills, changes in weight, changes in appetite Skin: no rash HEENT: no blurry vision, hearing changes, sore throat Pulm: See HPI CV: See HPI Abd: no abdominal pain, nausea/vomiting, diarrhea/constipation GU: See HPI Ext: no arthralgias, myalgias Neuro: some BLE tingling at baseline   Physical Exam: Blood pressure 209/79, pulse 71, temperature 98.2 F (36.8 C), temperature source Oral, resp. rate 18, height 5' (1.524 m), weight 119 lb 11.4 oz (54.3 kg), SpO2 97.00%. General: tired appearing elderly woman who is alert, cooperative, and in no apparent distress-  patient does not speak [place] well HEENT: pupils equal round and reactive to light, vision grossly intact, oropharynx clear and non-erythematous, moist mucous membranes  Neck: supple, JVD to angle of mandible Lungs: crackles to bilateral lungs from base to midway up lung fields; normal work of respiration, no accessory muscle use, no wheezes Heart: regular rate and rhythm with loud S2, no murmurs, gallops, or rubs Abdomen: soft, non-tender, non-distended, normal bowel sounds Extremities: warm extremities, 1+ BLE edema, no cyanosis, clubbing Neurologic: alert & oriented X3, cranial nerves II-XII intact, strength grossly intact, sensation intact to light touch  Lab results: Basic Metabolic Panel:  Recent Labs  16/10/96 0917  NA 141  K 5.2*  CL 109  GLUCOSE 114*  BUN 23  CREATININE 1.30*   CBC:  Recent Labs  08/22/12 0917 08/22/12 0930  WBC  --  4.5  HGB 9.2* 8.8*  HCT 27.0* 25.9*  MCV  --  88.1  PLT  --  188   Cardiac Enzymes:  Recent Labs  08/22/12 0930  TROPONINI <0.30   BNP:  Recent Labs  08/22/12 0930  PROBNP 6331.0*   CBG:  Recent Labs  08/22/12 0818 08/22/12 1658  GLUCAP 105* 101*   Anemia Panel:  Recent Labs  08/22/12 0907  VITAMINB12 453  FOLATE >20.0  FERRITIN 242  TIBC 235*  IRON 47  RETICCTPCT 3.3*   Urinalysis:  Recent Labs  08/22/12 1314  COLORURINE YELLOW  LABSPEC 1.010  PHURINE 7.0  GLUCOSEU NEGATIVE  HGBUR MODERATE*  BILIRUBINUR NEGATIVE  KETONESUR NEGATIVE  PROTEINUR >300*  UROBILINOGEN 0.2  NITRITE POSITIVE*  LEUKOCYTESUR LARGE*   Misc. Labs: FOBT- negative  Imaging results:  Dg Chest 2 View  08/22/2012   *RADIOLOGY REPORT*  Clinical Data: Shortness of breath.  CHEST - 2 VIEW  Comparison: 04/10/2011  Findings: There is evidence of mild acute interstitial edema without significant pleural effusion.  The heart is mildly enlarged.  No focal pulmonary consolidation is seen.  IMPRESSION: Interstitial edema.    Original Report Authenticated By: Irish Lack, M.D.   Other results: EKG: NSR minimal ST depression in lead II, V5-V6 no prior to compare to.   Assessment & Plan by Problem: [name] [name] is a 62yo woman w/ h/o HTN and DM who presents with DOE and dry cough, found to have elevated BNP and heart enlargement and interstitial edema on CXR, consistent with acute CHF exacerbation.   # Acute CHF exacerbation: Given patient's history of increasing exertional SOB, PND, 2 pillow orthopnea along with elevated BNP and CXR showing heart enlargement and interstitial edema, I believe this is an acute CHF exacerbation. It is unclear whether this is diastolic or systolic CHF, however, given her h/o uncontrolled HTN, patient my have diastolic failure. Patient is not compliant with her medications and she is not followed  by a PCP, so it is possible that patient may have some systolic failure 2/2 underlying coronary disease. Other things to consider with new onset CHF hyper/hypothyroidsim, HIV- will rule these out. -Echo -lasix 40mg  IV once, lasix 40mg  PO bid -TSH -HIV ab -replete electrolytes prn -BMet and CBC in am, Mg level, Phos level -tele -daily weights -cardiology aware- will follow recs  #HTN: Patient with elevated BP to 209/79 on my exam. Patient has been off all antihypertensives for at least a year.  -BP will likely decrease with lasix; will treat with hydralazine 5mg  IV once with hydralazine 10mg  q4prn for SBP>180 -per cardiology, will add amlodipine 5mg  PO daily  # Diabetes mellitus: Patient takes metformin at home. Unknown HbA1c. She does complain of some BLE peripheral neuropathy that she used to take something for, but she cannot remember the name of this medication.  -hold home metformin -SSI sensitive -HbA1c -monitor CBG  # AKI: Likely prerenal 2/2 decreased cardiac output and intravascular depletion 2/2 third spacing of fluids. May also be previously undiagnosed chronic kidney disease given  she has DM w/ known neuropathy. Patient's BP is also very high, which can also cause kidney damage and increased Cr. Will follow her renal function.  -will diurese, so need to monitor kidney function closely.  # UTI: Patient with dirty UA on admission. Patient also with increased urinary frequencyGiven 1 dose ceftriaxone in ED.  -cipro 500mg  bid x 6 days  # Anemia of chronic disease: Patient with Hgb 8.8 on admission. MCV is normal. Iron, ferritin, folate, and B12 levels are wnl; TIBC is low. Anemia panel and patient's history of chronic disease (DM, CHF) are consistent with the diagnosis of anemia of chronic disease.  Hyperkalemia: Patient presented with K of 5.1, no EKG changes. Likely secondary to AKI. Will likely resolve with lasix treatment. Will continue to monitor.  # VTE: lovenox  # Diet: carb mod  Code status: full  Dispo: Disposition is deferred at this time, awaiting improvement of current medical problems. Anticipated discharge in approximately 2 day(s).   The patient does not have a current PCP (No primary provider on file.) and does need an [facility] hospital follow-up appointment after discharge.  The patient does not have transportation limitations that hinder transportation to clinic appointments.  Signed: Windell Hummingbird, MD 08/22/2012, 5:33 PM

## 2012-08-22 NOTE — ED Notes (Signed)
Report received from Lisa R., RN

## 2012-08-23 DIAGNOSIS — I509 Heart failure, unspecified: Secondary | ICD-10-CM

## 2012-08-23 LAB — CBC
MCH: 30 pg (ref 26.0–34.0)
MCHC: 34.4 g/dL (ref 30.0–36.0)
MCV: 87.1 fL (ref 78.0–100.0)
Platelets: 198 10*3/uL (ref 150–400)
RBC: 3.17 MIL/uL — ABNORMAL LOW (ref 3.87–5.11)
RDW: 14.1 % (ref 11.5–15.5)

## 2012-08-23 LAB — GLUCOSE, CAPILLARY
Glucose-Capillary: 101 mg/dL — ABNORMAL HIGH (ref 70–99)
Glucose-Capillary: 175 mg/dL — ABNORMAL HIGH (ref 70–99)

## 2012-08-23 LAB — BASIC METABOLIC PANEL
BUN: 26 mg/dL — ABNORMAL HIGH (ref 6–23)
CO2: 24 mEq/L (ref 19–32)
Calcium: 9 mg/dL (ref 8.4–10.5)
Creatinine, Ser: 1.31 mg/dL — ABNORMAL HIGH (ref 0.50–1.10)
Glucose, Bld: 98 mg/dL (ref 70–99)

## 2012-08-23 MED ORDER — AMLODIPINE BESYLATE 10 MG PO TABS: 10.0000 mg | ORAL_TABLET | Freq: Every day | ORAL | Status: DC

## 2012-08-23 MED ORDER — METFORMIN HCL 1000 MG PO TABS: 500.0000 mg | ORAL_TABLET | Freq: Every day | ORAL | Status: DC

## 2012-08-23 MED ORDER — CIPROFLOXACIN HCL 500 MG PO TABS: 500.0000 mg | ORAL_TABLET | Freq: Two times a day (BID) | ORAL | Status: DC

## 2012-08-23 MED ORDER — FUROSEMIDE 20 MG PO TABS: 20.0000 mg | ORAL_TABLET | Freq: Every day | ORAL | Status: DC | PRN

## 2012-08-23 NOTE — Progress Notes (Signed)
Went over all discharge instructions with pt's daughter. Pt primary language is from [place]. Speaks very little [place].

## 2012-08-23 NOTE — H&P (Signed)
I have seen the patient and reviewed the daily progress note by Roxanne Mins, MS3 and discussed the care of the patient with them. Please see my note for documentation of my findings, assessment, and plans.  [facility], PGY1 Pager [phone] 08/23/2012, 5:16 PM

## 2012-08-23 NOTE — Progress Notes (Signed)
Pt discharged per w/c accompanied by nurse tech and daughter. Has all info about follow up appts, home meds and when to call MD. Has all personal belongings.

## 2012-08-23 NOTE — Discharge Summary (Signed)
Name: [name] [name] MRN: [id number] DOB: Mar 16, 1950 62 y.o. PCP: No primary provider on file.  Date of Admission: 08/22/2012  7:53 AM Date of Discharge: 08/24/2012 Attending Physician: Dr. Josem Kaufmann  Discharge Diagnosis: Principal Problem:   Acute diastolic CHF- likely due to chronic uncontrolled HTN Active Problems:   AKI (acute kidney injury)- likely prerenal 2/2 diuresis   Anemia- likely 2/2 chronic disease   Diabetes mellitus, type 2- on metformin   Complicated UTI  Discharge Medications:   Medication List         amLODipine 10 MG tablet  Commonly known as:  NORVASC  Take 1 tablet (10 mg total) by mouth daily.     ciprofloxacin 500 MG tablet  Commonly known as:  CIPRO  Take 1 tablet (500 mg total) by mouth 2 (two) times daily.     furosemide 20 MG tablet  Commonly known as:  LASIX  Take 1 tablet (20 mg total) by mouth daily as needed. For weight 3 pounds above your dry weight.     metFORMIN 1000 MG tablet  Commonly known as:  GLUCOPHAGE  Take 0.5 tablets (500 mg total) by mouth daily with breakfast.     OVER THE COUNTER MEDICATION  Take 1 tablet by mouth 2 (two) times daily. One a day vitamin for diabetics     OVER THE COUNTER MEDICATION  Take 15 mLs by mouth daily as needed (cough). Over the counter cough syrup        Disposition and follow-up:   [name] [name] was discharged from [facility] in Stable condition.  At the hospital follow up visit please address:  1.  Diastolic CHF and HTN- Newly diagnosed this admission, EF 50-55%, likely due to uncontrolled HTN. Please follow up on medication regimen for both CHF and HTN and make sure patient is taking her medications as prescribed. She has h/o medication noncompliance and uncontrolled HTN. Assess whether or not patient is measuring her weight at home.   2.  Labs / imaging needed at time of follow-up: BMP to evaluate whether or not AKI resolved  3.  Pending labs/ test needing follow-up:  none  Follow-up Appointments: Follow-up Information   Follow up with Levy Pupa, PA-C On 08/27/2012. (10:00am)    Contact information:   [address]  [place], [place] [address]  [phone]      Discharge Instructions: Discharge Orders   Future Orders Complete By Expires     (HEART FAILURE PATIENTS) Call MD:  Anytime you have any of the following symptoms: 1) 3 pound weight gain in 24 hours or 5 pounds in 1 week 2) shortness of breath, with or without a dry hacking cough 3) swelling in the hands, feet or stomach 4) if you have to sleep on extra pillows at night in order to breathe.  As directed     Call MD for:  persistant dizziness or light-headedness  As directed     Call MD for:  temperature >100.4  As directed     Diet - low sodium heart healthy  As directed     Increase activity slowly  As directed        Consultations:  Cardiology- Dr. Kristeen Miss  Procedures Performed:  Dg Chest 2 View  08/22/2012   *RADIOLOGY REPORT*  Clinical Data: Shortness of breath.  CHEST - 2 VIEW  Comparison: 04/10/2011  Findings: There is evidence of mild acute interstitial edema without significant pleural effusion.  The heart is mildly enlarged.  No focal pulmonary consolidation is seen.  IMPRESSION: Interstitial edema.   Original Report Authenticated By: Irish Lack, M.D.   2D Echo: 7/16- - Left ventricle: Mild diffuse hypokinesis worse in distal septum The cavity size was mildly dilated. Wall thickness was increased in a pattern of mild LVH. Systolic function was normal. The estimated ejection fraction was in the range of 50% to 55%. - Aortic valve: Mild regurgitation. - Mitral valve: Calcified annulus. Mild to moderate regurgitation. - Left atrium: The atrium was mildly dilated. - Atrial septum: No defect or patent foramen ovale was identified.  Admission HPI:  [name] is a 62yo [place] woman w/ h/o HTN and DM who presents with worsening SOB over the past couple of weeks. Per  daughter, patient has had intermittent episodes of DOE with nonproductive cough over the past year, but the current episode has lasted much longer than usual. Patient's SOB is worsened with lying flat and she endorses 2 pillow orthopnea as well as intermittent episodes of paroxysmal nocturnal dyspnea. Patient tires easily with daily activities and her SOB improves with rest. Patient has had a dry cough x 1 week as well as some BLE edema recently. Patient c/o increased urinary frequency and hesitancy, but denies dysuria or hematuria. She reports having some diarrhea, but notes this is chronic (she thinks due to her metformin), denies blood in her stool. Denies abdominal pain, CP. Daughter thinks patient used to be on lisinopril for her HTN, but has not been taking this or any other antihypertensive for over a year since she is uninsured.   In the ED, patient had BMet showing K of 5.1, Cr 1.3, CBC with Hb 8.8, negative troponin, dirty UA with 7-10 WBC/hpf, large leukocyte esterase, +nitrites. BNP 6331 w/ CXR showing mild interstitial edema and enlarged heart. FOBT negative. EKG NSR minimal ST depression in lead II, V5-V6 no prior to compare to.   Hospital Course by problem list:   1. Acute Diastolic CHF exacerbation- Patient presented with DOE, PND, orthopnea with BNP 6331 and JVD to angle of mandible with crackles on lung exam bilaterally. Echo showed preserved systolic function, EF 50-55%m, consistent with diagnosis of acute diastolic CHF exacerbation. Patient with poor blood pressure control and has been off all antihypertensives for at least 1 year, so this episode is thought to be due to uncontrolled HTN causing diastolic dysfunction. Patient was managed with one dose of lasix 40mg  IV and since she responded so rapidly, she was switched to lasix 40mg  PO, but she only required one dose before she was feeling just about back to her baseline and her Cr increased slightly, indicating she was dry. Per  cardiology, we added amlodipine 10mg  po daily to manage her HTN. However, might consider adding ACEi/ARB or B blocker to her regimen if her BP is still uncontrolled on amlodipine. Patient and daughter were instructed to take a dry weight immediately when they got home after discharge and to write this number down, then to take daily weights in the morning on the same scale. If weight is above dry weight by 3lbs or more, patient was instructed to take lasix 20mg  once daily prn only, she is not to take lasix daily otherwise. She was instructed to return to the hospital or call her PCP if she has worsening SOB or her weight is consistently higher than her dry weight. No ACE at this time 2/2 AKI. Follow up scheduled with her PCP on Monday.   2. Uncontrolled HTN- Patient has not  been on any medication for over 1 year given financial constraints. Patient with SBP in 220s upon admission. She was managed on amlodipine 5mg  po daily and received a one time dose of hydralazine 10mg  IV for SBP of 202. Patient's BP ranged from 160s-180s/60s-70s during her admission. She was discharged on amlodipine 10mg  po daily. Will need close outpatient follow up for management of her HTN given this is likely causing her dCHF. We did not add ACEi/ARB 2/2 AKI. She may benefit from addition of beta blocker given her diastolic dysfunction.  3. DM type 2- Patient appears to have good blood glucose control, HbA1c 6.4. She takes metformin at home regularly. She was managed on sensitive sliding scale during this hospitalization and discharged home with her home dose of metformin.  4. Acute kidney injury- Thought to be due to poor kidney perfusion 2/2 dCHF exacerbation, Cr 1.30 on admission- last Cr in our system was .9 on 04/2011. This improved with diuresis to 1.05, then as we took off fluid, the Cr increased again to 1.3, which was expected given we were volume depleting the patient. Therefore, when patient was discharged, her AKI was likely  prerenal, but this should be followed up. It is hard to know what patient's baseline kidney function is since she has DM with some peripheral neuropathy and does not follow with PCP regularly. Therefore, there may be a component of chronic kidney disease and this should be further investigated in the outpatient setting.  4. Complicated UTI- Patient with dirty UA on admission and patient endorsed increased urinary frequency as well as recent increased urinary hesitancy. UCx returned after patient was discharged and showed growth of pansensitive E coli. This was deemed a complicated UTI given h/o DM. Patient received 1 dose of ceftriaxone in ED and was discharged with ciprofloxacin 500mg  bid x 6 days for a total course of 7 days.   Discharge Vitals:   BP 162/60  Pulse 74  Temp(Src) 98.5 F (36.9 C) (Oral)  Resp 18  Ht 5' (1.524 m)  Wt 116 lb 12.8 oz (52.98 kg)  BMI 22.81 kg/m2  SpO2 98%  Discharge Labs:  Results for orders placed during the hospital encounter of 08/22/12 (from the past 24 hour(s))  GLUCOSE, CAPILLARY     Status: Abnormal   Collection Time    08/23/12 11:20 AM      Result Value Range   Glucose-Capillary 175 (*) 70 - 99 mg/dL   Comment 1 Notify RN      Signed: Windell Hummingbird, MD 08/24/2012, 11:14 AM   Time Spent on Discharge: 45 minutes Services Ordered on Discharge: none Equipment Ordered on Discharge: none

## 2012-08-23 NOTE — Progress Notes (Signed)
Subjective:   [name] [name] shows significant clinical improvement and is ready for discharge today. She responded extremely well to her first dose of IV lasix yesterday as evidenced by improved pulmonary edema, peripheral edema and JVD. Immediate drop in creatinine following lasix and up trend this morning support her dry status. This morning she continues to report slight shortness of breath but denies orthopnea or paroxysmal nocturnal dyspnea. Additionally she continues to deny any chest or abdominal pain. Discharge and follow up instructions were relayed to the patient via the daughter.  Objective: Vital signs in last 24 hours: Filed Vitals:   08/22/12 1853 08/22/12 2013 08/23/12 0546 08/23/12 0740  BP: 202/72 168/64 162/60   Pulse:  70 74   Temp:  98 F (36.7 C) 98.5 F (36.9 C)   TempSrc:  Oral Oral   Resp:  18 18 18   Height:      Weight:   52.98 kg (116 lb 12.8 oz)   SpO2:  98% 99% 98%   Weight change:   Intake/Output Summary (Last 24 hours) at 08/23/12 1312 Last data filed at 08/23/12 1610  Gross per 24 hour  Intake      0 ml  Output   1900 ml  Net  -1900 ml   Physical Exam  General: elderly female alert and oriented, well appearing and in no acute distress   HHENT: pupils equal, round and reactive to light, moist mucous membranes  Heart: regular rate and rhythm, non aortic-stenosis holosystolic murmur at left sternal border with a loud split S2, left carotid bruits, 7+JVP, no JVD, no heaves or thrills, normal PMI  Lungs: good respiratory effort, lungs clear to ascultation bilaterally,no wheezes, rales or rhonchi Abdomen: soft, non-tender abdomen, normal active bowel sounds, no rebound or guarding  Extremities: no peripheral edema, 2+ bilateral lower extremity pulses Neuro: alert and oriented x3, cranial nerves II-XII grossly intact, strength and sensation grossly intact bilaterally in upper and lower extremities  Lab Results: @LABTEST2 @ Micro Results: No results found for  this or any previous visit (from the past 240 hour(s)). Studies/Results: Dg Chest 2 View  08/22/2012   *RADIOLOGY REPORT*  Clinical Data: Shortness of breath.  CHEST - 2 VIEW  Comparison: 04/10/2011  Findings: There is evidence of mild acute interstitial edema without significant pleural effusion.  The heart is mildly enlarged.  No focal pulmonary consolidation is seen.  IMPRESSION: Interstitial edema.   Original Report Authenticated By: Irish Lack, M.D.   Medications: I have reviewed the patient's current medications. Scheduled Meds: . [START ON 08/24/2012] amLODipine  10 mg Oral Daily  . ciprofloxacin  500 mg Oral BID  . enoxaparin (LOVENOX) injection  40 mg Subcutaneous Q24H  . insulin aspart  0-9 Units Subcutaneous TID WC  . sodium chloride  3 mL Intravenous Q12H   Continuous Infusions:  PRN Meds:.acetaminophen, acetaminophen, hydrALAZINE, ondansetron (ZOFRAN) IV, ondansetron  Assessment/Plan:   [name] [name] is a 62 year old [place] female with a history of diabetes and uncontrolled hypertension, who presents with an acute exacerbation of progressive dyspnea on exertion, 2 pillow orthopnea, PND and associated cough.   #Acute exacerbation of CHF   Patient presented with 1 year history of progressively worsening dyspnea on exertion, 2 pillow orthopnea, PND and associated cough, which has acutely worsened. On exam patient showed signs of volume overload such as peripheral edema, elevated JVD/ JVP, non aortic stenosis holosystolic flow murmur, and bilateral basilar crackles. Labs showed significantly elevated BNP (6331) and evidence of pre renal azotemia (  creat 1.3). CXR reveals mild interstitial edema with slight enlargement of the heart. Constellation of signs and symptoms are most consistent with an acute exacerbation of CHF.  Significant clinical improvement with diuresis further supports our diagnosis. Patient is "dry" today and is ready for discharge. -Lasix 20mg  PRN with 3lb+ weight  gain from baseline "dry" weight -Amlodipine 5mg  Qday -Follow up with PCP, discuss starting and ACE inhibitor and or beta blocker for CHF once kidney function is stable -Echo pending   # HTN   BP of 220/83 on admission, current BP 162/60. Reported medication non compliance on lisinopril in addition to the patients volume overloaded status due acute CHF exacerbation are likely the contributing factors to the elevated pressure on admission. Diuresis with lasix decreased the pressure as expected.  -Amlodipine 5mg  Qday   # Acute Kidney Injury  Creatinine on admission was 1.30. Following diuresis creatinine decreased to 1.05. After a second round of diuresis creatinine trended up again indicating the patients "dry" status was achieved with treatment.  This response supports that CHF likely precipitated pre renal AKI. Kidney function should continue to improve after discharge.   #Anemia  Decreased Hb 8.8 and Hct 25.9 on admission indicate significant anemia. Normal iron, ferritin, and transferrin rule out iron deficiency anemia. Anemia is most likely anemia of chronic disease due to the patients history of DM and worsening CHF. Today  -Follow up CBC with PCP  #Hyperkalemia  K on admission was 5.2. Most likely cause of the hyperkalemia was increase in the context of her acute kidney injury. Additionally, it could be a falsely elevated level due to hemolysis at the time of the blood draw. Hyperkalemia resolved with diuresis.   #Urinary tract infection  Patient reports increased frequency and hesitancy in urination, but denies dysuria and hematuria. UA was positive for nitrite and leukocytes, with WBC too numerous without squamous cell contamination. Due to symptomatic presentation, patient will continue to be treated for UTI.  -Cipro 500mg  BID for 6 days   This is a Psychologist, occupational Note.  The care of the patient was discussed with Dr. Darci Needle and the assessment and plan formulated with their  assistance.  Please see their attached note for official documentation of the daily encounter.   LOS: 1 day   [name] [name], Med Student 08/23/2012, 1:12 PM

## 2012-08-23 NOTE — H&P (Signed)
I saw and evaluated the patient. I reviewed the resident's note and confirmed the resident's findings.  I agree with the assessment and plan as documented in the resident's note.  [name] [name] is a 62 year old woman with a history of diabetes and hypertension who presents with a one-year history of slowly progressive dyspnea on exertion, two-pillow orthopnea, and progressive paroxysmal nocturnal dyspnea. The orthopnea and paroxysmal nocturnal dyspnea have developed more recently but have also been progressive and affecting her ability to sleep. Despite having a history of hypertension she has not taken her lisinopril for over a year secondary to concerns about cost. She also notes a dry cough over the last week and some bilateral foot swelling that resolves overnight. She presented to the emergency department with progressive dyspnea and is admitted to the internal medicine teaching service for further evaluation and care. She was found to have a jugular venous pressure of approximately 9 cm of water and rare basilar crackles after receiving a dose of Lasix. Her BNP was over 6000. An echocardiogram revealed left ventricular ejection fraction of 50-55% with mild hypertrophy and left ventricular dilatation. The study is consistent with diastolic dysfunction. She was therefore diagnosed with acute on chronic diastolic heart failure and was treated with a dose of IV Lasix. She responded exceptionally well to this therapy putting out nearly 2 L. Last night she slept much better with no episodes of paroxysmal nocturnal dyspnea. She feels she is ready to go home. We will manage her afterload and have her followup early next week in hopes of initiating lisinopril and/or beta blocker therapy. The reason why the lisinopril is not being started today is that she is now got some prerenal azotemia after her diuresis and we do not want to exacerbate her kidney dysfunction. We explained to her the need to check her weight on a daily  basis after obtaining a scale and writing down her weight today which will be considered "dry weight".  If her weight should increase by 3 pounds over her dry weight she should take a 20 mg tablet of Lasix as needed. If her weight does not decline to within 3 pounds over her dry weight after a couple of days of extra Lasix she should call her primary care physician to be evaluated so further changes could be made in her regimen. I agree with the housestaff's plan to discharge her home today with followup in the [facility] early next week.

## 2012-08-23 NOTE — Progress Notes (Signed)
CARDIOLOGY CONSULT NOTE  Patient ID: [name] [name], MRN: [id number], DOB/AGE: 1950/11/22 62 y.o. Admit date: 08/22/2012   Date of Consult: 08/23/2012 Primary Physician: No primary provider on file. Primary Cardiologist: New to Dr. Elease Hashimoto  Chief Complaint: SOB and cough Reason for Consult: ?CHF  HPI: [name] [name] is a 62 y/o [place] F with a h/o DM and HTN and no prior cardiac history who presents today c/o worsened SOB and cough x 2 weeks. Much of history is provided by patient's daughter due to language barrier; information provided by patient was translated by her adult daughter, which patient consented to.   Her symptoms date back to last year, but have been worse over the last 2 weeks. The patient was seen in ED on 04/2011 c/o productive cough, SOB, and posttussive vomiting. During this visit, DM and HTN were addressed as she was started on Metformin and Lisinopril. Her respiratory symptoms did not improve so soon after the ED visit she went to urgent care and was diagnosed with pneumonia. Apparently since this time she has had intermittent SOB with cough and has experienced difficulty performing ADLs such as grocery shopping dt fatigue and SOB. She continues to take Metformin which daughter pays for, but has not taken Lisinopril in at least 1 year due to financial status. For the past 2 months, she has noted mild LE edema R>L after standing or walking for a period of time. For the last 2 weeks, her SOB and cough have worsened and become more persistent. Walking increases SOB and rest alleviates dyspnea. For past 1 week, patient has had orthopnea. Does not weigh herself at home. Denies chest pain, palpitations, PND, dizziness, abdominal pain, n/v, hematuria, dark urine, or dysuria. Attests to frequent urination (Q 1hr) with only small amount of urine voided, but this is unchanged in past year. Also reports vision changes and sensitivity to light, which daughter attributes to diabetes. She has not been able  to afford to see ophthalmologist or endocrinologist.   She feels OK this am.    Past Medical History  Diagnosis Date  . Diabetes mellitus     Reportedly diagnosed 2011 but no medication initiated until 04/2011 (Metformin)  . Hypertension   . Pneumonia   . Diabetic peripheral neuropathy     Since 2013  . CHF (congestive heart failure) 08/2012  . Anemia 08/2012  . UTI (urinary tract infection) 08/22/2012      Most Recent Cardiac Studies: None   Surgical History:  Past Surgical History  Procedure Laterality Date  . Cesarean section       Home Meds: Prior to Admission medications   Medication Sig Start Date End Date Taking? Authorizing Provider  metFORMIN (GLUCOPHAGE) 1000 MG tablet Take 0.5 tablets (500 mg total) by mouth daily with breakfast. 04/10/11 08/22/12 Yes Toy Baker, MD  OVER THE COUNTER MEDICATION Take 1 tablet by mouth 2 (two) times daily. One a day vitamin for diabetics   Yes Historical Provider, MD  OVER THE COUNTER MEDICATION Take 15 mLs by mouth daily as needed (cough). Over the counter cough syrup   Yes Historical Provider, MD    Inpatient Medications:     Allergies: No Known Allergies  History   Social History  . Marital Status: Married    Spouse Name: N/A    Number of Children: N/A  . Years of Education: N/A   Occupational History  . Not on file.   Social History Main Topics  . Smoking status: Never Smoker   .  Smokeless tobacco: Never Used  . Alcohol Use: No  . Drug Use: No  . Sexually Active: Not on file   Other Topics Concern  . Not on file   Social History Narrative   Patient lives with husband and son. Currently without insurance and therefore tries not seek medical care dt financial concern.      Family History  Problem Relation Age of Onset  . Diabetes Brother     deceased in 43s dt DM complications    Labs:  Recent Labs  08/22/12 0930  TROPONINI <0.30   Pro-BNP: 6331.0  Lab Results  Component Value Date   WBC 4.7  08/23/2012   HGB 9.5* 08/23/2012   HCT 27.6* 08/23/2012   MCV 87.1 08/23/2012   PLT 198 08/23/2012     Recent Labs Lab 08/22/12 1825 08/23/12 0540  NA 136 137  K 4.6 4.1  CL 104 102  CO2 23 24  BUN 22 26*  CREATININE 1.05 1.31*  CALCIUM 9.4 9.0  PROT 8.0  --   BILITOT 0.4  --   ALKPHOS 86  --   ALT 18  --   AST 23  --   GLUCOSE 123* 98   Radiology/Studies:  Dg Chest 2 View  08/22/2012   *RADIOLOGY REPORT*  Clinical Data: Shortness of breath.  CHEST - 2 VIEW  Comparison: 04/10/2011  Findings: There is evidence of mild acute interstitial edema without significant pleural effusion.  The heart is mildly enlarged.  No focal pulmonary consolidation is seen.  IMPRESSION: Interstitial edema.   Original Report Authenticated By: Irish Lack, M.D.    EKG: NSR 72bpm minimal ST depression II, V5-V6 no prior to compare to  Physical Exam: Blood pressure 162/60, pulse 74, temperature 98.5 F (36.9 C), temperature source Oral, resp. rate 18, height 5' (1.524 m), weight 116 lb 12.8 oz (52.98 kg), SpO2 99.00%. General: Well developed, well nourished thin asian F in no acute distress. Head: Normocephalic, atraumatic. Neck: Negative for carotid bruits. Mild JVD. Lungs: Diminished breath sounds at bases with bibasilar rales initially but after lasix has no rales. No wheezing or rhonchi. Breathing is unlabored. Heart: RRR with S1 S2. No murmurs, rubs, or gallops appreciated. Abdomen: Soft, non-tender, non-distended with normoactive bowel sounds. Extremities: No clubbing or cyanosis. No edema.  Distal pedal pulses are 1+ and equal bilaterally. Neuro: Alert and oriented X 3. No facial asymmetry. No focal deficit.  Psych:  Responds to questions appropriately (with daughter translating) with a normal affect.     Assessment and Plan:  1. Acute heart failure, type unknown: echo is not yet reported.   Continue current meds. .   2. HTN: Untreated and likely long-standing. Monitor BP trend after  Lasix initiated. Would avoid ACEI acutely given elevated creatinine and hyperkalemia.    3. anemia: Hgb: 8.8 (was 15g/dL on 02/13/08). Hemoccult negative but moderate Hgb on UA. May be contributing to increased SOB. Anemia panel pending. Consider blood transfusion. Internal medicine to follow. Surprisingly she is not tachycardic with this, so question slower loss.  4. Acute renal insufficiency: Creatinine 1.3. Watch with diuresis. Further w/u per IM given abnormal UA with proteinuria and hematuria.  5. DM: Appears well-controlled, especially considering glucose on CMET was 433 on last hospital visit 04/10/2011. Daughter reports BG levels at home are 100s-120s. CBG this morning 114. Consider discontinuing Metformin acutely while renal insufficiency is present. Recommend sliding-scale insulin.     Vesta Mixer, Montez Hageman., MD, [facility] 08/23/2012, 7:40 AM Office -  [phone] Pager [phone]

## 2012-08-23 NOTE — Progress Notes (Signed)
Utilization review completed. Alphonza Tramell, RN, BSN. 

## 2012-08-23 NOTE — Progress Notes (Signed)
   I have seen the patient and reviewed the daily progress note by Roxanne Mins, MS3 and discussed the care of the patient with them. Please see my note for documentation of my findings, assessment, and plans.  [facility], PGY1 Pager [phone] 08/23/2012, 5:14 PM

## 2012-08-23 NOTE — Progress Notes (Signed)
Subjective: Ms. Ditto is much improved this morning. She slept fairly well and denies any PND. She has some mild SOB, no CP, palpitations, abd pain. She received amlodipine 5mg  once last night as well as one dose of hydralazine 10mg  for BP 202/72. Patient's blood pressure is more well controlled than it was yesterday and was approx. 162/60 most of last night. Discussed with patient and daughter how to take a dry weight at home and when to take a lasix pill. Discussed importance of keeping her blood pressure under control. She follows in the [facility] clinic. She feels ready to go home and agrees to follow up with [facility] on Monday.  Objective: Vital signs in last 24 hours: Filed Vitals:   08/22/12 1853 08/22/12 2013 08/23/12 0546 08/23/12 0740  BP: 202/72 168/64 162/60   Pulse:  70 74   Temp:  98 F (36.7 C) 98.5 F (36.9 C)   TempSrc:  Oral Oral   Resp:  18 18 18   Height:      Weight:   116 lb 12.8 oz (52.98 kg)   SpO2:  98% 99% 98%    Intake/Output Summary (Last 24 hours) at 08/23/12 1102 Last data filed at 08/23/12 0721  Gross per 24 hour  Intake      0 ml  Output   1900 ml  Net  -1900 ml   Physical Exam: General: alert, cooperative, and in no apparent distress- appears to be in better spirits this morning HEENT: vision grossly intact, moist mucous membranes Neck: supple, JVD not visible on R today Lungs: clear to ascultation bilaterally, normal work of respiration, no wheezes, rales, ronchi Heart: regular rate and rhythm, 2/6 systolic murmur, no gallops or rubs Abdomen: soft, non-tender, non-distended, normal bowel sounds Extremities: warm extremities, 1+ BLE edema Neurologic: alert & oriented X3, cranial nerves grossly II-XII intact, strength grossly intact, sensation intact to light touch  Lab Results: Basic Metabolic Panel:  Recent Labs Lab 08/22/12 0917 08/22/12 1825 08/23/12 0540  NA 141 136 137  K 5.2* 4.6 4.1  CL 109 104 102  CO2  --  23 24    GLUCOSE 114* 123* 98  BUN 23 22 26*  CREATININE 1.30* 1.05 1.31*  CALCIUM  --  9.4 9.0  MG  --  1.9  --   PHOS  --  4.9*  --    Liver Function Tests:  Recent Labs Lab 08/22/12 1825  AST 23  ALT 18  ALKPHOS 86  BILITOT 0.4  PROT 8.0  ALBUMIN 3.1*   CBC:  Recent Labs Lab 08/22/12 0930 08/23/12 0540  WBC 4.5 4.7  HGB 8.8* 9.5*  HCT 25.9* 27.6*  MCV 88.1 87.1  PLT 188 198   Cardiac Enzymes:  Recent Labs Lab 08/22/12 0930  TROPONINI <0.30   BNP:  Recent Labs Lab 08/22/12 0930  PROBNP 6331.0*   CBG:  Recent Labs Lab 08/22/12 0818 08/22/12 1658 08/22/12 2035 08/23/12 0607  GLUCAP 105* 101* 149* 101*   Hemoglobin A1C:  Recent Labs Lab 08/22/12 1825  HGBA1C 6.4*   Thyroid Function Tests:  Recent Labs Lab 08/22/12 1825  TSH 1.849   Anemia Panel:  Recent Labs Lab 08/22/12 0907  VITAMINB12 453  FOLATE >20.0  FERRITIN 242  TIBC 235*  IRON 47  RETICCTPCT 3.3*   Urinalysis:  Recent Labs Lab 08/22/12 1314  COLORURINE YELLOW  LABSPEC 1.010  PHURINE 7.0  GLUCOSEU NEGATIVE  HGBUR MODERATE*  BILIRUBINUR NEGATIVE  KETONESUR NEGATIVE  PROTEINUR >300*  UROBILINOGEN 0.2  NITRITE POSITIVE*  LEUKOCYTESUR LARGE*   Studies/Results: Dg Chest 2 View  08/22/2012   *RADIOLOGY REPORT*  Clinical Data: Shortness of breath.  CHEST - 2 VIEW  Comparison: 04/10/2011  Findings: There is evidence of mild acute interstitial edema without significant pleural effusion.  The heart is mildly enlarged.  No focal pulmonary consolidation is seen.  IMPRESSION: Interstitial edema.   Original Report Authenticated By: Irish Lack, M.D.   Medications: I have reviewed the patient's current medications. Scheduled Meds: . [START ON 08/24/2012] amLODipine  10 mg Oral Daily  . ciprofloxacin  500 mg Oral BID  . enoxaparin (LOVENOX) injection  40 mg Subcutaneous Q24H  . insulin aspart  0-9 Units Subcutaneous TID WC  . sodium chloride  3 mL Intravenous Q12H   PRN  Meds:.acetaminophen, acetaminophen, hydrALAZINE, ondansetron (ZOFRAN) IV, ondansetron  Assessment/Plan: Ms. [name] [name] is a 62yo woman w/ h/o HTN and DM who presents with DOE and dry cough, found to have elevated BNP and heart enlargement and interstitial edema on CXR, consistent with acute CHF exacerbation.   # Acute CHF exacerbation: Given patient's history of increasing exertional SOB, PND, 2 pillow orthopnea along with elevated BNP and CXR showing heart enlargement and interstitial edema, I believe this is an acute CHF exacerbation. It is unclear whether this is diastolic or systolic CHF, however, given her h/o uncontrolled HTN, patient my have diastolic failure. Patient is not compliant with her medications, so it is possible that patient may have some systolic failure 2/2 underlying coronary disease. -Echo- result pending -lasix 40mg  IV once, lasix 40mg  PO bid  -TSH wnl -HIV ab pending -K decreased appropriately with diuresis  -daily weights- patient and daughter educated on how to do this at home  #HTN: Patient with BP hovering around 162-188/60-75 overnight, one reading of 202/72 for which hydralazine 10mg  was given.  -Will discharge patient with amlodipine 10mg  po daily. Will hold off on ACEi for now since her Cr is 1.3 this morning, but patient will likely benefit long term from ACEi therapy. B blocker should also be considered especially if this is systolic CHF (awaiting Echo results).   # Diabetes mellitus: Patient takes metformin at home. HbA1c during this admission 6.4%. She does complain of some BLE peripheral neuropathy that she used to take something for, but she cannot remember the name of this medication.  -hold home metformin - will continue upon discharge -SSI sensitive   # AKI: Likely due to volume depletion since we diuresed her over night (Cr went down, then bumped again this morning). May also be previously undiagnosed chronic kidney disease given she has DM w/ known neuropathy.  Patient's BP is also very high, which can also cause kidney damage and increased Cr.  -will need follow up to make sure AKI resolves  # UTI: Patient with dirty UA on admission. Patient also with increased urinary frequencyGiven 1 dose ceftriaxone in ED.  -cipro 500mg  bid x 6 days  -Ucx pending  # Anemia of chronic disease: Patient with Hgb 8.8 on admission. MCV is normal. Iron, ferritin, folate, and B12 levels are wnl; TIBC is low. Anemia panel and patient's history of chronic disease (DM, CHF) are consistent with the diagnosis of anemia of chronic disease.   Hyperkalemia: Patient presented with K of 5.1, no EKG changes. Likely secondary to AKI. Resolved with lasix treatment.   # VTE: lovenox   # Diet: carb mod   Code status: full  Dispo: Discharge to home  today.  The patient does have a current PCP (No primary provider on file.) and does not need an [facility] hospital follow-up appointment after discharge.  The patient does not have transportation limitations that hinder transportation to clinic appointments.  .Services Needed at time of discharge: Y = Yes, Blank = No PT:   OT:   RN:   Equipment:   Other:     LOS: 1 day   Windell Hummingbird, MD 08/23/2012, 11:02 AM

## 2012-08-24 LAB — URINE CULTURE: Colony Count: >=100000

## 2012-08-27 NOTE — ED Provider Notes (Signed)
Medical screening examination/treatment/procedure(s) were conducted as a shared visit with non-physician practitioner(s) and myself.  I personally evaluated the patient during the encounter Pt with cough. Sob. chf on cxr/labs. Card consult, med admit.   Suzi Roots, MD 08/27/12 [phone]

## 2013-01-19 ENCOUNTER — Ambulatory Visit: Payer: Self-pay | Admitting: Family Medicine

## 2013-01-19 ENCOUNTER — Ambulatory Visit: Payer: Self-pay

## 2013-01-19 VITALS — BP 142/86 | HR 83 | Temp 97.4°F | Resp 17 | Ht 60.0 in | Wt 139.0 lb

## 2013-01-19 DIAGNOSIS — N179 Acute kidney failure, unspecified: Secondary | ICD-10-CM

## 2013-01-19 DIAGNOSIS — D649 Anemia, unspecified: Secondary | ICD-10-CM

## 2013-01-19 DIAGNOSIS — I10 Essential (primary) hypertension: Secondary | ICD-10-CM

## 2013-01-19 DIAGNOSIS — R05 Cough: Secondary | ICD-10-CM

## 2013-01-19 DIAGNOSIS — R319 Hematuria, unspecified: Secondary | ICD-10-CM

## 2013-01-19 DIAGNOSIS — R600 Localized edema: Secondary | ICD-10-CM

## 2013-01-19 DIAGNOSIS — E119 Type 2 diabetes mellitus without complications: Secondary | ICD-10-CM

## 2013-01-19 DIAGNOSIS — R609 Edema, unspecified: Secondary | ICD-10-CM

## 2013-01-19 DIAGNOSIS — Z79899 Other long term (current) drug therapy: Secondary | ICD-10-CM

## 2013-01-19 DIAGNOSIS — I509 Heart failure, unspecified: Secondary | ICD-10-CM

## 2013-01-19 LAB — POCT CBC
HCT, POC: 35.9 % — AB (ref 37.7–47.9)
Hemoglobin: 10.8 g/dL — AB (ref 12.2–16.2)
Lymph, poc: 1.2 (ref 0.6–3.4)
MCH, POC: 29.1 pg (ref 27–31.2)
MCHC: 30.1 g/dL — AB (ref 31.8–35.4)
MCV: 96.9 fL (ref 80–97)
POC LYMPH PERCENT: 29.7 %L (ref 10–50)
WBC: 4.2 10*3/uL — AB (ref 4.6–10.2)

## 2013-01-19 LAB — POCT URINALYSIS DIPSTICK
Glucose, UA: NEGATIVE
Nitrite, UA: NEGATIVE
Urobilinogen, UA: 0.2

## 2013-01-19 LAB — BASIC METABOLIC PANEL
BUN: 33 mg/dL — ABNORMAL HIGH (ref 6–23)
CO2: 23 mEq/L (ref 19–32)
Calcium: 8.5 mg/dL (ref 8.4–10.5)
Creat: 1.44 mg/dL — ABNORMAL HIGH (ref 0.50–1.10)
Glucose, Bld: 135 mg/dL — ABNORMAL HIGH (ref 70–99)
Sodium: 138 mEq/L (ref 135–145)

## 2013-01-19 LAB — POCT UA - MICROSCOPIC ONLY: Yeast, UA: NEGATIVE

## 2013-01-19 MED ORDER — FUROSEMIDE 40 MG PO TABS: 40.0000 mg | ORAL_TABLET | Freq: Two times a day (BID) | ORAL | Status: DC

## 2013-01-19 MED ORDER — AMLODIPINE BESYLATE 10 MG PO TABS: 5.0000 mg | ORAL_TABLET | Freq: Every day | ORAL | Status: DC

## 2013-01-19 MED ORDER — METFORMIN HCL 1000 MG PO TABS: 500.0000 mg | ORAL_TABLET | Freq: Every day | ORAL | Status: DC

## 2013-01-19 MED ORDER — POTASSIUM CHLORIDE CRYS ER 20 MEQ PO TBCR: 20.0000 meq | EXTENDED_RELEASE_TABLET | Freq: Two times a day (BID) | ORAL | Status: DC

## 2013-01-19 MED ORDER — CIPROFLOXACIN HCL 500 MG PO TABS: 500.0000 mg | ORAL_TABLET | Freq: Two times a day (BID) | ORAL | Status: DC

## 2013-01-19 NOTE — Patient Instructions (Signed)
Please call the clinic below to get an appointment asap.  They are a clinic set up by [facility] to care for people without health insurance so may be able to get you care at much cheaper cost with a large discount on labs, imaging, and medication. [facility] [address] [place], [place] [address] Hours of Operation Mon - Fri: 9 a.m. - 6 p.m. Main: [phone]  Heart Failure Heart failure means your heart has trouble pumping blood. This makes it hard for your body to work well. Heart failure is usually a long-term (chronic) condition. You must take good care of yourself and follow your doctor's treatment plan. HOME CARE  Take your heart medicine as told by your doctor.  Do not stop taking medicine unless your doctor tells you to.  Do not skip any dose of medicine.  Refill your medicines before they run out.  Take other medicines only as told by your doctor or pharmacist.  Stay active if told by your doctor. The elderly and people with severe heart failure should talk with a doctor about physical activity.  Eat heart healthy foods. Choose foods that are without trans fat and are low in saturated fat, cholesterol, and salt (sodium). This includes fresh or frozen fruits and vegetables, fish, lean meats, fat-free or low-fat dairy foods, whole grains, and high-fiber foods. Lentils and dried peas and beans (legumes) are also good choices.  Limit salt if told by your doctor.  Cook in a healthy way. Roast, grill, broil, bake, poach, steam, or stir-fry foods.  Limit fluids as told by your doctor.  Weigh yourself every morning. Do this after you pee (urinate) and before you eat breakfast. Write down your weight to give to your doctor.  Take your blood pressure and write it down if your doctor tell you to.  Ask your doctor how to check your pulse. Check your pulse as told.  Lose weight if told by your doctor.  Stop smoking or chewing  tobacco. Do not use gum or patches that help you quit without your doctor's approval.  Schedule and go to doctor visits as told.  Nonpregnant women should have no more than 1 drink a day. Men should have no more than 2 drinks a day. Talk to your doctor about drinking alcohol.  Stop illegal drug use.  Stay current with shots (immunizations).  Manage your health conditions as told by your doctor.  Learn to manage your stress.  Rest when you are tired.  If it is really hot outside:  Avoid intense activities.  Use air conditioning or fans, or get in a cooler place.  Avoid caffeine and alcohol.  Wear loose-fitting, lightweight, and light-colored clothing.  If it is really cold outside:  Avoid intense activities.  Layer your clothing.  Wear mittens or gloves, a hat, and a scarf when going outside.  Avoid alcohol.  Learn about heart failure and get support as needed.  Get help to maintain or improve your quality of life and your ability to care for yourself as needed. GET HELP IF:   You gain 03 lb/1.4 kg or more in 1 day or 05 lb/2.3 kg in a week.  You are more short of breath than usual.  You cannot do your normal activities.  You tire easily.  You cough more than normal, especially with activity.  You have any or more puffiness (swelling) in areas such as your hands, feet, ankles, or belly (abdomen).  You cannot sleep because it is hard to breathe.  You feel like your heart is beating fast (palpitations).  You get dizzy or lightheaded when you stand up. GET HELP RIGHT AWAY IF:   You have trouble breathing.  There is a change in mental status, such as becoming less alert or not being able to focus.  You have chest pain or discomfort.  You faint. MAKE SURE YOU:   Understand these instructions.  Will watch your condition.  Will get help right away if you are not doing well or get worse. Document Released: 11/03/2007 Document Revised: 05/21/2012  Document Reviewed: 08/25/2011 [facility] Patient Information 2014 [place], [place].

## 2013-01-19 NOTE — Progress Notes (Addendum)
Subjective:  This chart was scribed for Norberto Sorenson, MD at [facility] by Yevette Edwards, Scribe. This pt's care was started at 12:17 PM.  Chief Complaint  Patient presents with  . Shortness of Breath  . Fatigue  . Anorexia  . Leg Swelling  . Headache     Patient ID: [name] [name], female    DOB: October 30, 1950, 62 y.o.   MRN: [id number]  HPI  [name] [name] is a 62 y.o. female, with a h/o untreated DM and HTN, who presents to [facility] complaining of bilateral lower extremity swelling which began five days ago. She has a h/o CHF and renal failure, and her last hospital admission was five months ago due to CHF exacerbation with acute renal failure. As associated symptoms, the pt has also experienced a sensation of coldness to her legs, fatigue, a persistent cough, SOB, orthopnea, paroxysmal nocturnal dyspnea, a decreased appetite, and a headache. The pt states the SOB has been occurring for several weeks. She states that she is able to urinate, though she reports it takes approximately an hour to void.   She has a h/o medication non-compliance. Her grandson reports that the pt has been taking metformin and Vit B12 and a DM vitamin as prescribed. He also reports that she had been taking her lasix medication 10 qd until it ran out a month ago. She checks her CBGs daily, and her CBGs this morning were 143; her grandson states that the her CBGs have increased in the past several weeks.  He reports that her CBGs were in the 120's prior to the escalation.   She has not been eating very much lately and c/o abd pain.  Her PCP is at [facility]. Her last appointment was at least three months ago.   The pt speaks [place], and her grandson translated for her.   She is a non-smoker.   Past Medical History  Diagnosis Date  . Diabetes mellitus     Reportedly diagnosed 2011 but no medication initiated until 04/2011 (Metformin)  . Hypertension   . Pneumonia   . Diabetic peripheral  neuropathy     Since 2013  . CHF (congestive heart failure) 08/2012  . Anemia 08/2012  . UTI (urinary tract infection) 08/22/2012   Current Outpatient Prescriptions on File Prior to Visit  Medication Sig Dispense Refill  . amLODipine (NORVASC) 10 MG tablet Take 1 tablet (10 mg total) by mouth daily.  30 tablet  2  . ciprofloxacin (CIPRO) 500 MG tablet Take 1 tablet (500 mg total) by mouth 2 (two) times daily.  12 tablet  0  . furosemide (LASIX) 20 MG tablet Take 1 tablet (20 mg total) by mouth daily as needed. For weight 3 pounds above your dry weight.  30 tablet  1  . metFORMIN (GLUCOPHAGE) 1000 MG tablet Take 0.5 tablets (500 mg total) by mouth daily with breakfast.  30 tablet  0  . OVER THE COUNTER MEDICATION Take 1 tablet by mouth 2 (two) times daily. One a day vitamin for diabetics      . OVER THE COUNTER MEDICATION Take 15 mLs by mouth daily as needed (cough). Over the counter cough syrup       No current facility-administered medications on file prior to visit.    No Known Allergies  Review of Systems  Constitutional: Positive for appetite change and fatigue.  Respiratory: Positive for cough and shortness of breath.   Cardiovascular: Positive for leg swelling. Negative for chest  pain.  Genitourinary: Positive for difficulty urinating.  Neurological: Positive for headaches.  Psychiatric/Behavioral: Positive for sleep disturbance.    BP 142/86  Pulse 83  Temp(Src) 97.4 F (36.3 C) (Oral)  Resp 17  Ht 5' (1.524 m)  Wt 139 lb (63.05 kg)  BMI 27.15 kg/m2  SpO2 96%    Objective:   Physical Exam  Nursing note and vitals reviewed. Constitutional: She is oriented to person, place, and time. She appears well-developed and well-nourished. No distress.  HENT:  Head: Normocephalic and atraumatic.  Eyes: EOM are normal.  Neck: Neck supple. No tracheal deviation present.  Cardiovascular: Normal rate, regular rhythm, normal heart sounds and intact distal pulses.   No murmur  heard. 3+ pitting edema to knees bilaterally.   Pulmonary/Chest: Effort normal. No respiratory distress. She has rales.  Mild, fine, left greater than right, inspiratory rales.   Abdominal: Soft. She exhibits no mass. Bowel sounds are decreased. There is tenderness in the right upper quadrant and left upper quadrant. There is no CVA tenderness.  Hypoactive bowel sounds.  LUQ and RUQ tenderness to palpation.  No masses.   Musculoskeletal: Normal range of motion. She exhibits edema.  2+ sacral pitting edema to top of lumbar spine.  No CVA tenderness.   Neurological: She is alert and oriented to person, place, and time.  Skin: Skin is warm and dry.  Psychiatric: She has a normal mood and affect. Her behavior is normal.  Pt walked in clinic on O2 sat - O2 sat remained 98-100% w/ exertion, HR nml.    1:15 PM- Chest x-ray as interpreted by me: Large bibasilar effusions.   Results for orders placed in visit on 01/19/13  POCT CBC      Result Value Range   WBC 4.2 (*) 4.6 - 10.2 K/uL   Lymph, poc 1.2  0.6 - 3.4   POC LYMPH PERCENT 29.7  10 - 50 %L   MID (cbc) 0.6  0 - 0.9   POC MID % 14.4 (*) 0 - 12 %M   POC Granulocyte 2.3  2 - 6.9   Granulocyte percent 55.9  37 - 80 %G   RBC 3.71 (*) 4.04 - 5.48 M/uL   Hemoglobin 10.8 (*) 12.2 - 16.2 g/dL   HCT, POC 16.1 (*) 09.6 - 47.9 %   MCV 96.9  80 - 97 fL   MCH, POC 29.1  27 - 31.2 pg   MCHC 30.1 (*) 31.8 - 35.4 g/dL   RDW, POC 04.5     Platelet Count, POC 124 (*) 142 - 424 K/uL   MPV 8.9  0 - 99.8 fL  POCT UA - MICROSCOPIC ONLY      Result Value Range   WBC, Ur, HPF, POC 2-4     RBC, urine, microscopic 5-10     Bacteria, U Microscopic small     Mucus, UA small     Epithelial cells, urine per micros 3-5     Crystals, Ur, HPF, POC neg     Casts, Ur, LPF, POC hyaline     Yeast, UA neg    POCT URINALYSIS DIPSTICK      Result Value Range   Color, UA yellow     Clarity, UA cloudy     Glucose, UA neg     Bilirubin, UA neg     Ketones, UA  neg     Spec Grav, UA 1.025     Blood, UA moderate     pH, UA  5.5     Protein, UA 3+     Urobilinogen, UA 0.2     Nitrite, UA neg     Leukocytes, UA Trace    POCT GLYCOSYLATED HEMOGLOBIN (HGB A1C)      Result Value Range   Hemoglobin A1C 5.9     Assessment & Plan:  AKI (acute kidney injury) - UA likely dirty but due to abd pain and h/o UTIs will go ahead and cover w/ cipro 500 bid due to hematuria. Bmp pending  Anemia - stable, 2/2 ACD from DM.  Diabetes mellitus, type 2 - Plan: Basic metabolic panel, POCT UA - Microscopic Only, POCT urinalysis dipstick, POCT glycosylated hemoglobin (Hb A1C) - very well controlled. Many handouts in [place] on DM diet given, refilled metformin 500 qam.  Edema of both legs - Plan: DG Chest 2 View, POCT CBC, Basic metabolic panel, POCT UA - Microscopic Only  Cough - Plan: DG Chest 2 View, POCT CBC, POCT UA - Microscopic Only  HTN - restart amlodipine but decrease to 5 qd while on high dose lasix.  Acute CHF - 12:25 PM- Discussed treatment plan with patient which includes that she would initially start on a high dose of Lasix medication to expedite the process 40 bid w/ K20 bid, and advised the pt that she would need to be rechecked on Monday, in two days, because the high dosage of Lasix requires careful monitoring of her kidneys. Advised pt that she should limit salt intake and she should limit her consumption of fluids to 2 liters of water a day. Encouraged ambulation and exercise. Tried to explain nature and chronicity of illness - importance of reg dr visits to prevent exacerbations as well as poss poor prognosis.  Advised pt establish w/ [facility] for more cost-efficient care.  Meds ordered this encounter  Medications  . ciprofloxacin (CIPRO) 500 MG tablet    Sig: Take 1 tablet (500 mg total) by mouth 2 (two) times daily.    Dispense:  6 tablet    Refill:  0  . amLODipine (NORVASC) 10 MG tablet    Sig: Take 0.5 tablets (5 mg total)  by mouth daily.    Dispense:  30 tablet    Refill:  2  . metFORMIN (GLUCOPHAGE) 1000 MG tablet    Sig: Take 0.5 tablets (500 mg total) by mouth daily with breakfast.    Dispense:  30 tablet    Refill:  2  . furosemide (LASIX) 40 MG tablet    Sig: Take 1 tablet (40 mg total) by mouth 2 (two) times daily. For weight 3 pounds above your dry weight.    Dispense:  60 tablet    Refill:  0  . potassium chloride SA (K-DUR,KLOR-CON) 20 MEQ tablet    Sig: Take 1 tablet (20 mEq total) by mouth 2 (two) times daily. With lasix    Dispense:  60 tablet    Refill:  0    I personally performed the services described in this documentation, which was scribed in my presence. The recorded information has been reviewed and considered, and addended by me as needed.  Norberto Sorenson, MD MPH

## 2013-01-21 ENCOUNTER — Other Ambulatory Visit: Payer: Self-pay | Admitting: Internal Medicine

## 2013-01-21 ENCOUNTER — Ambulatory Visit: Payer: Self-pay | Admitting: Emergency Medicine

## 2013-01-21 VITALS — BP 128/66 | HR 69 | Temp 97.8°F | Resp 18 | Ht 59.5 in | Wt 128.2 lb

## 2013-01-21 DIAGNOSIS — D649 Anemia, unspecified: Secondary | ICD-10-CM

## 2013-01-21 DIAGNOSIS — I509 Heart failure, unspecified: Secondary | ICD-10-CM

## 2013-01-21 DIAGNOSIS — E119 Type 2 diabetes mellitus without complications: Secondary | ICD-10-CM

## 2013-01-21 MED ORDER — FUROSEMIDE 40 MG PO TABS: 40.0000 mg | ORAL_TABLET | Freq: Two times a day (BID) | ORAL | Status: DC

## 2013-01-21 MED ORDER — POTASSIUM CHLORIDE CRYS ER 20 MEQ PO TBCR: 20.0000 meq | EXTENDED_RELEASE_TABLET | Freq: Two times a day (BID) | ORAL | Status: DC

## 2013-01-21 NOTE — Patient Instructions (Signed)
Heart Failure °Heart failure is a condition in which the heart has trouble pumping blood. This means your heart does not pump blood efficiently for your body to work well. In some cases of heart failure, fluid may back up into your lungs or you may have swelling (edema) in your lower legs. Heart failure is usually a long-term (chronic) condition. It is important for you to take good care of yourself and follow your caregiver's treatment plan. °CAUSES  °Some health conditions can cause heart failure. Those health conditions include: °· High blood pressure (hypertension) causes the heart muscle to work harder than normal. When pressure in the blood vessels is high, the heart needs to pump (contract) with more force in order to circulate blood throughout the body. High blood pressure eventually causes the heart to become stiff and weak. °· Coronary artery disease (CAD) is the buildup of cholesterol and fat (plaque) in the arteries of the heart. The blockage in the arteries deprives the heart muscle of oxygen and blood. This can cause chest pain and may lead to a heart attack. High blood pressure can also contribute to CAD. °· Heart attack (myocardial infarction) occurs when 1 or more arteries in the heart become blocked. The loss of oxygen damages the muscle tissue of the heart. When this happens, part of the heart muscle dies. The injured tissue does not contract as well and weakens the heart's ability to pump blood. °· Abnormal heart valves can cause heart failure when the heart valves do not open and close properly. This makes the heart muscle pump harder to keep the blood flowing. °· Heart muscle disease (cardiomyopathy or myocarditis) is damage to the heart muscle from a variety of causes. These can include drug or alcohol abuse, infections, or unknown reasons. These can increase the risk of heart failure. °· Lung disease makes the heart work harder because the lungs do not work properly. This can cause a strain  on the heart, leading it to fail. °· Diabetes increases the risk of heart failure. High blood sugar contributes to high fat (lipid) levels in the blood. Diabetes can also cause slow damage to tiny blood vessels that carry important nutrients to the heart muscle. When the heart does not get enough oxygen and food, it can cause the heart to become weak and stiff. This leads to a heart that does not contract efficiently. °· Other conditions can contribute to heart failure. These include abnormal heart rhythms, thyroid problems, and low blood counts (anemia). °Certain unhealthy behaviors can increase the risk of heart failure. Those unhealthy behaviors include: °· Being overweight. °· Smoking or chewing tobacco. °· Eating foods high in fat and cholesterol. °· Abusing illicit drugs or alcohol. °· Lacking physical activity. °SYMPTOMS  °Heart failure symptoms may vary and can be hard to detect. Symptoms may include: °· Shortness of breath with activity, such as climbing stairs. °· Persistent cough. °· Swelling of the feet, ankles, legs, or abdomen. °· Unexplained weight gain. °· Difficulty breathing when lying flat (orthopnea). °· Waking from sleep because of the need to sit up and get more air. °· Rapid heartbeat. °· Fatigue and loss of energy. °· Feeling lightheaded, dizzy, or close to fainting. °· Loss of appetite. °· Nausea. °· Increased urination during the night (nocturia). °DIAGNOSIS  °A diagnosis of heart failure is based on your history, symptoms, physical examination, and diagnostic tests. °Diagnostic tests for heart failure may include: °· Echocardiography. °· Electrocardiography. °· Chest X-ray. °· Blood tests. °· Exercise   stress test. °· Cardiac angiography. °· Radionuclide scans. °TREATMENT  °Treatment is aimed at managing the symptoms of heart failure. Medicines, behavioral changes, or surgical intervention may be necessary to treat heart failure. °· Medicines to help treat heart failure may  include: °· Angiotensin-converting enzyme (ACE) inhibitors. This type of medicine blocks the effects of a blood protein called angiotensin-converting enzyme. ACE inhibitors relax (dilate) the blood vessels and help lower blood pressure. °· Angiotensin receptor blockers. This type of medicine blocks the actions of a blood protein called angiotensin. Angiotensin receptor blockers dilate the blood vessels and help lower blood pressure. °· Water pills (diuretics). Diuretics cause the kidneys to remove salt and water from the blood. The extra fluid is removed through urination. This loss of extra fluid lowers the volume of blood the heart pumps. °· Beta blockers. These prevent the heart from beating too fast and improve heart muscle strength. °· Digitalis. This increases the force of the heartbeat. °· Healthy behavior changes include: °· Obtaining and maintaining a healthy weight. °· Stopping smoking or chewing tobacco. °· Eating heart healthy foods. °· Limiting or avoiding alcohol. °· Stopping illicit drug use. °· Physical activity as directed by your caregiver. °· Surgical treatment for heart failure may include: °· A procedure to open blocked arteries, repair damaged heart valves, or remove damaged heart muscle tissue. °· A pacemaker to improve heart muscle function and control certain abnormal heart rhythms. °· An internal cardioverter defibrillator to treat certain serious abnormal heart rhythms. °· A left ventricular assist device to assist the pumping ability of the heart. °HOME CARE INSTRUCTIONS  °· Take your medicine as directed by your caregiver. Medicines are important in reducing the workload of your heart, slowing the progression of heart failure, and improving your symptoms. °· Do not stop taking your medicine unless directed by your caregiver. °· Do not skip any dose of medicine. °· Refill your prescriptions before you run out of medicine. Your medicines are needed every day. °· Take over-the-counter  medicine only as directed by your caregiver or pharmacist. °· Engage in moderate physical activity if directed by your caregiver. Moderate physical activity can benefit some people. The elderly and people with severe heart failure should consult with a caregiver for physical activity recommendations. °· Eat heart healthy foods. Food choices should be free of trans fat and low in saturated fat, cholesterol, and salt (sodium). Healthy choices include fresh or frozen fruits and vegetables, fish, lean meats, legumes, fat-free or low-fat dairy products, and whole grain or high fiber foods. Talk to a dietitian to learn more about heart healthy foods. °· Limit sodium if directed by your caregiver. Sodium restriction may reduce symptoms of heart failure in some people. Talk to a dietitian to learn more about heart healthy seasonings. °· Use healthy cooking methods. Healthy cooking methods include roasting, grilling, broiling, baking, poaching, steaming, or stir-frying. Talk to a dietitian to learn more about healthy cooking methods. °· Limit fluids if directed by your caregiver. Fluid restriction may reduce symptoms of heart failure in some people. °· Weigh yourself every day. Daily weights are important in the early recognition of excess fluid. You should weigh yourself every morning after you urinate and before you eat breakfast. Wear the same amount of clothing each time you weigh yourself. Record your daily weight. Provide your caregiver with your weight record. °· Monitor and record your blood pressure if directed by your caregiver. °· Check your pulse if directed by your caregiver. °· Lose weight if directed   by your caregiver. Weight loss may reduce symptoms of heart failure in some people. °· Stop smoking or chewing tobacco. Nicotine makes your heart work harder by causing your blood vessels to constrict. Do not use nicotine gum or patches before talking to your caregiver. °· Schedule and attend follow-up visits as  directed by your caregiver. It is important to keep all your appointments. °· Limit alcohol intake to no more than 1 drink per day for nonpregnant women and 2 drinks per day for men. Drinking more than that is harmful to your heart. Tell your caregiver if you drink alcohol several times a week. Talk with your caregiver about whether alcohol is safe for you. If your heart has already been damaged by alcohol or you have severe heart failure, drinking alcohol should be stopped completely. °· Stop illicit drug use. °· Stay up-to-date with immunizations. It is especially important to prevent respiratory infections through current pneumococcal and influenza immunizations. °· Manage other health conditions such as hypertension, diabetes, thyroid disease, or abnormal heart rhythms as directed by your caregiver. °· Learn to manage stress. °· Plan rest periods when fatigued. °· Learn strategies to manage high temperatures. If the weather is extremely hot: °· Avoid vigorous physical activity. °· Use air conditioning or fans or seek a cooler location. °· Avoid caffeine and alcohol. °· Wear loose-fitting, lightweight, and light-colored clothing. °· Learn strategies to manage cold temperatures. If the weather is extremely cold: °· Avoid vigorous physical activity. °· Layer clothes. °· Wear mittens or gloves, a hat, and a scarf when going outside. °· Avoid alcohol. °· Obtain ongoing education and support as needed. °· Participate or seek rehabilitation as needed to maintain or improve independence and quality of life. °SEEK MEDICAL CARE IF:  °· Your weight increases by 03 lb/1.4 kg in 1 day or 05 lb/2.3 kg in a week. °· You have increasing shortness of breath that is unusual for you. °· You are unable to participate in your usual physical activities. °· You tire easily. °· You cough more than normal, especially with physical activity. °· You have any or more swelling in areas such as your hands, feet, ankles, or abdomen. °· You  are unable to sleep because it is hard to breathe. °· You feel like your heart is beating fast (palpitations). °· You become dizzy or lightheaded upon standing up. °SEEK IMMEDIATE MEDICAL CARE IF:  °· You have difficulty breathing. °· There is a change in mental status such as decreased alertness or difficulty with concentration. °· You have a pain or discomfort in your chest. °· You have an episode of fainting (syncope). °MAKE SURE YOU:  °· Understand these instructions. °· Will watch your condition. °· Will get help right away if you are not doing well or get worse. °Document Released: 01/24/2005 Document Revised: 05/21/2012 Document Reviewed: 02/16/2012 °ExitCare® Patient Information ©2014 [employer]. ° °

## 2013-01-21 NOTE — Progress Notes (Addendum)
[facility] [address], [place] [address] [phone]  Date:  01/21/2013   Name:  [name] [name]   DOB:  August 14, 1950   MRN:  [id number]  PCP:  No primary provider on file.   [place] speaker translated by her son.  Chief Complaint: Follow-up   History of Present Illness:  [name] [name] is a 62 y.o. very pleasant female patient who presents with the following:  Last seen a week ago by Dr Clelia Croft.  She has improved.  Has still marked edema in legs to mid thigh but she has diuresed 11 pounds.  She has no orthopnea or PND or chest pain.  With the decrease in her symptoms she has a better appetite.  She has no fever or chills, no nausea or vomiting.  No stool change.  Sleep has improved.  Denies other complaint or health concern today.   Patient Active Problem List   Diagnosis Date Noted  . Acute CHF 08/22/2012  . AKI (acute kidney injury) 08/22/2012  . Anemia 08/22/2012  . Diabetes mellitus, type 2 08/22/2012    Past Medical History  Diagnosis Date  . Diabetes mellitus     Reportedly diagnosed 2011 but no medication initiated until 04/2011 (Metformin)  . Hypertension   . Pneumonia   . Diabetic peripheral neuropathy     Since 2013  . CHF (congestive heart failure) 08/2012  . Anemia 08/2012  . UTI (urinary tract infection) 08/22/2012    Past Surgical History  Procedure Laterality Date  . Cesarean section      History  Substance Use Topics  . Smoking status: Never Smoker   . Smokeless tobacco: Never Used  . Alcohol Use: No    Family History  Problem Relation Age of Onset  . Diabetes Brother     deceased in 27s dt DM complications    No Known Allergies  Medication list has been reviewed and updated.  Current Outpatient Prescriptions on File Prior to Visit  Medication Sig Dispense Refill  . amLODipine (NORVASC) 10 MG tablet Take 0.5 tablets (5 mg total) by mouth daily.  30 tablet  2  . ciprofloxacin (CIPRO) 500 MG tablet Take 1 tablet (500 mg total)  by mouth 2 (two) times daily.  6 tablet  0  . furosemide (LASIX) 40 MG tablet Take 1 tablet (40 mg total) by mouth 2 (two) times daily. For weight 3 pounds above your dry weight.  60 tablet  0  . metFORMIN (GLUCOPHAGE) 1000 MG tablet Take 0.5 tablets (500 mg total) by mouth daily with breakfast.  30 tablet  2  . OVER THE COUNTER MEDICATION Take 1 tablet by mouth 2 (two) times daily. One a day vitamin for diabetics      . OVER THE COUNTER MEDICATION Take 15 mLs by mouth daily as needed (cough). Over the counter cough syrup      . potassium chloride SA (K-DUR,KLOR-CON) 20 MEQ tablet Take 1 tablet (20 mEq total) by mouth 2 (two) times daily. With lasix  60 tablet  0   No current facility-administered medications on file prior to visit.    Review of Systems:  As per HPI, otherwise negative.    Physical Examination: Filed Vitals:   01/21/13 1934  BP: 128/66  Pulse: 69  Temp: 97.8 F (36.6 C)  Resp: 18   Filed Vitals:   01/21/13 1934  Height: 4' 11.5" (1.511 m)  Weight: 128 lb 3.2 oz (58.151 kg)   Body mass  index is 25.47 kg/(m^2). Ideal Body Weight: Weight in (lb) to have BMI = 25: 125.6  GEN: WDWN, NAD, Non-toxic, A & O x 3 HEENT: Atraumatic, Normocephalic. Neck supple. No masses, No LAD. Ears and Nose: No external deformity. CV: RRR, No M/G/R. No JVD. No thrill. No extra heart sounds. PULM: CTA B, no wheezes, crackles, rhonchi. No retractions. No resp. distress. No accessory muscle use. ABD: S, NT, ND, +BS. No rebound. No HSM. EXTR: No c/c  Has +4 pitting to knee NEURO Normal gait.  PSYCH: Normally interactive. Conversant. Not depressed or anxious appearing.  Calm demeanor.    Assessment and Plan: CHF improving NIDDM Refill lasix Continue aggressive lasix treatment Follow up in two weeks with Dr Clelia Croft   Signed,  Phillips Odor, MD   Results for orders placed in visit on 01/21/13  BASIC METABOLIC PANEL      Result Value Range   Sodium 137  135 - 145 mEq/L    Potassium 4.8  3.5 - 5.3 mEq/L   Chloride 102  96 - 112 mEq/L   CO2 28  19 - 32 mEq/L   Glucose, Bld 132 (*) 70 - 99 mg/dL   BUN 45 (*) 6 - 23 mg/dL   Creat 1.61 (*) 0.96 - 1.10 mg/dL   Calcium 8.7  8.4 - 04.5 mg/dL

## 2013-01-22 LAB — BASIC METABOLIC PANEL
BUN: 45 mg/dL — ABNORMAL HIGH (ref 6–23)
Calcium: 8.7 mg/dL (ref 8.4–10.5)
Glucose, Bld: 132 mg/dL — ABNORMAL HIGH (ref 70–99)
Sodium: 137 mEq/L (ref 135–145)

## 2013-01-23 MED ORDER — POTASSIUM CHLORIDE CRYS ER 20 MEQ PO TBCR: 20.0000 meq | EXTENDED_RELEASE_TABLET | Freq: Two times a day (BID) | ORAL | Status: DC

## 2013-01-23 NOTE — Addendum Note (Signed)
Addended by: Norberto Sorenson on: 01/23/2013 11:41 AM   Modules accepted: Orders

## 2013-01-23 NOTE — Addendum Note (Signed)
Addended by: Carmelina Dane on: 01/23/2013 05:07 PM   Modules accepted: Orders

## 2013-01-27 ENCOUNTER — Other Ambulatory Visit: Payer: Self-pay

## 2013-01-27 DIAGNOSIS — R609 Edema, unspecified: Secondary | ICD-10-CM

## 2013-01-27 DIAGNOSIS — I509 Heart failure, unspecified: Secondary | ICD-10-CM

## 2013-01-27 DIAGNOSIS — Z79899 Other long term (current) drug therapy: Secondary | ICD-10-CM

## 2013-01-27 DIAGNOSIS — R600 Localized edema: Secondary | ICD-10-CM

## 2013-01-27 DIAGNOSIS — N179 Acute kidney failure, unspecified: Secondary | ICD-10-CM

## 2013-01-28 LAB — BASIC METABOLIC PANEL
Calcium: 8.9 mg/dL (ref 8.4–10.5)
Sodium: 139 mEq/L (ref 135–145)

## 2013-02-07 DIAGNOSIS — I472 Ventricular tachycardia: Secondary | ICD-10-CM

## 2013-02-07 DIAGNOSIS — I4729 Other ventricular tachycardia: Secondary | ICD-10-CM

## 2013-02-07 HISTORY — PX: CARDIAC CATHETERIZATION: SHX172

## 2013-02-07 HISTORY — DX: Other ventricular tachycardia: I47.29

## 2013-02-07 HISTORY — DX: Ventricular tachycardia: I47.2

## 2013-03-05 ENCOUNTER — Emergency Department (HOSPITAL_COMMUNITY): Payer: Medicaid Other

## 2013-03-05 ENCOUNTER — Inpatient Hospital Stay (HOSPITAL_COMMUNITY)
Admission: EM | Admit: 2013-03-05 | Discharge: 2013-03-19 | DRG: 286 | Disposition: A | Discharge status: Home / Self Care | Payer: Medicaid Other | Attending: Family Medicine | Admitting: Family Medicine

## 2013-03-05 ENCOUNTER — Encounter (HOSPITAL_COMMUNITY): Payer: Self-pay | Admitting: Emergency Medicine

## 2013-03-05 ENCOUNTER — Ambulatory Visit: Payer: Self-pay | Admitting: Family Medicine

## 2013-03-05 ENCOUNTER — Ambulatory Visit: Payer: Self-pay

## 2013-03-05 VITALS — BP 170/80 | HR 74 | Temp 98.1°F | Resp 20 | Ht 59.0 in | Wt 122.6 lb

## 2013-03-05 DIAGNOSIS — I080 Rheumatic disorders of both mitral and aortic valves: Secondary | ICD-10-CM | POA: Diagnosis present

## 2013-03-05 DIAGNOSIS — R0602 Shortness of breath: Secondary | ICD-10-CM

## 2013-03-05 DIAGNOSIS — E872 Acidosis, unspecified: Secondary | ICD-10-CM | POA: Diagnosis not present

## 2013-03-05 DIAGNOSIS — R570 Cardiogenic shock: Secondary | ICD-10-CM | POA: Diagnosis not present

## 2013-03-05 DIAGNOSIS — R609 Edema, unspecified: Secondary | ICD-10-CM | POA: Diagnosis present

## 2013-03-05 DIAGNOSIS — Z9119 Patient's noncompliance with other medical treatment and regimen: Secondary | ICD-10-CM

## 2013-03-05 DIAGNOSIS — N879 Dysplasia of cervix uteri, unspecified: Secondary | ICD-10-CM | POA: Diagnosis present

## 2013-03-05 DIAGNOSIS — I4729 Other ventricular tachycardia: Secondary | ICD-10-CM | POA: Diagnosis present

## 2013-03-05 DIAGNOSIS — T44905A Adverse effect of unspecified drugs primarily affecting the autonomic nervous system, initial encounter: Secondary | ICD-10-CM | POA: Diagnosis not present

## 2013-03-05 DIAGNOSIS — I130 Hypertensive heart and chronic kidney disease with heart failure and stage 1 through stage 4 chronic kidney disease, or unspecified chronic kidney disease: Principal | ICD-10-CM | POA: Diagnosis present

## 2013-03-05 DIAGNOSIS — R188 Other ascites: Secondary | ICD-10-CM | POA: Diagnosis present

## 2013-03-05 DIAGNOSIS — G589 Mononeuropathy, unspecified: Secondary | ICD-10-CM | POA: Diagnosis present

## 2013-03-05 DIAGNOSIS — I472 Ventricular tachycardia, unspecified: Secondary | ICD-10-CM | POA: Diagnosis present

## 2013-03-05 DIAGNOSIS — E119 Type 2 diabetes mellitus without complications: Secondary | ICD-10-CM

## 2013-03-05 DIAGNOSIS — I2589 Other forms of chronic ischemic heart disease: Secondary | ICD-10-CM | POA: Diagnosis present

## 2013-03-05 DIAGNOSIS — E871 Hypo-osmolality and hyponatremia: Secondary | ICD-10-CM | POA: Diagnosis not present

## 2013-03-05 DIAGNOSIS — N179 Acute kidney failure, unspecified: Secondary | ICD-10-CM

## 2013-03-05 DIAGNOSIS — R05 Cough: Secondary | ICD-10-CM | POA: Diagnosis present

## 2013-03-05 DIAGNOSIS — K5900 Constipation, unspecified: Secondary | ICD-10-CM | POA: Diagnosis present

## 2013-03-05 DIAGNOSIS — T50995A Adverse effect of other drugs, medicaments and biological substances, initial encounter: Secondary | ICD-10-CM | POA: Diagnosis not present

## 2013-03-05 DIAGNOSIS — I2582 Chronic total occlusion of coronary artery: Secondary | ICD-10-CM | POA: Diagnosis present

## 2013-03-05 DIAGNOSIS — K449 Diaphragmatic hernia without obstruction or gangrene: Secondary | ICD-10-CM | POA: Diagnosis present

## 2013-03-05 DIAGNOSIS — Z833 Family history of diabetes mellitus: Secondary | ICD-10-CM

## 2013-03-05 DIAGNOSIS — Z91199 Patient's noncompliance with other medical treatment and regimen due to unspecified reason: Secondary | ICD-10-CM | POA: Diagnosis not present

## 2013-03-05 DIAGNOSIS — D649 Anemia, unspecified: Secondary | ICD-10-CM | POA: Diagnosis present

## 2013-03-05 DIAGNOSIS — I2789 Other specified pulmonary heart diseases: Secondary | ICD-10-CM | POA: Diagnosis present

## 2013-03-05 DIAGNOSIS — I509 Heart failure, unspecified: Secondary | ICD-10-CM

## 2013-03-05 DIAGNOSIS — E1142 Type 2 diabetes mellitus with diabetic polyneuropathy: Secondary | ICD-10-CM | POA: Diagnosis present

## 2013-03-05 DIAGNOSIS — I272 Pulmonary hypertension, unspecified: Secondary | ICD-10-CM

## 2013-03-05 DIAGNOSIS — R5381 Other malaise: Secondary | ICD-10-CM | POA: Diagnosis present

## 2013-03-05 DIAGNOSIS — R059 Cough, unspecified: Secondary | ICD-10-CM | POA: Diagnosis present

## 2013-03-05 DIAGNOSIS — K219 Gastro-esophageal reflux disease without esophagitis: Secondary | ICD-10-CM | POA: Diagnosis present

## 2013-03-05 DIAGNOSIS — I2510 Atherosclerotic heart disease of native coronary artery without angina pectoris: Secondary | ICD-10-CM | POA: Diagnosis present

## 2013-03-05 DIAGNOSIS — I5043 Acute on chronic combined systolic (congestive) and diastolic (congestive) heart failure: Secondary | ICD-10-CM | POA: Diagnosis present

## 2013-03-05 DIAGNOSIS — E1149 Type 2 diabetes mellitus with other diabetic neurological complication: Secondary | ICD-10-CM | POA: Diagnosis present

## 2013-03-05 DIAGNOSIS — R1011 Right upper quadrant pain: Secondary | ICD-10-CM | POA: Diagnosis present

## 2013-03-05 DIAGNOSIS — R34 Anuria and oliguria: Secondary | ICD-10-CM | POA: Diagnosis not present

## 2013-03-05 DIAGNOSIS — I255 Ischemic cardiomyopathy: Secondary | ICD-10-CM

## 2013-03-05 DIAGNOSIS — J90 Pleural effusion, not elsewhere classified: Secondary | ICD-10-CM | POA: Diagnosis present

## 2013-03-05 DIAGNOSIS — R5383 Other fatigue: Secondary | ICD-10-CM

## 2013-03-05 DIAGNOSIS — I5023 Acute on chronic systolic (congestive) heart failure: Secondary | ICD-10-CM

## 2013-03-05 DIAGNOSIS — N183 Chronic kidney disease, stage 3 unspecified: Secondary | ICD-10-CM | POA: Diagnosis present

## 2013-03-05 DIAGNOSIS — N189 Chronic kidney disease, unspecified: Principal | ICD-10-CM

## 2013-03-05 LAB — POCT I-STAT, CHEM 8
BUN: 37 mg/dL — AB (ref 6–23)
CREATININE: 1.3 mg/dL — AB (ref 0.50–1.10)
Calcium, Ion: 1.15 mmol/L (ref 1.13–1.30)
Chloride: 107 mEq/L (ref 96–112)
GLUCOSE: 128 mg/dL — AB (ref 70–99)
HCT: 35 % — ABNORMAL LOW (ref 36.0–46.0)
HEMOGLOBIN: 11.9 g/dL — AB (ref 12.0–15.0)
POTASSIUM: 4.8 meq/L (ref 3.7–5.3)
Sodium: 137 mEq/L (ref 137–147)
TCO2: 21 mmol/L (ref 0–100)

## 2013-03-05 LAB — POCT CBC
GRANULOCYTE PERCENT: 59.4 % (ref 37–80)
HEMATOCRIT: 37.5 % — AB (ref 37.7–47.9)
HEMOGLOBIN: 11.3 g/dL — AB (ref 12.2–16.2)
LYMPH, POC: 1.3 (ref 0.6–3.4)
MCH, POC: 28.6 pg (ref 27–31.2)
MCHC: 30.1 g/dL — AB (ref 31.8–35.4)
MCV: 95 fL (ref 80–97)
MID (cbc): 0.8 (ref 0–0.9)
MPV: 9.2 fL (ref 0–99.8)
POC GRANULOCYTE: 3.1 (ref 2–6.9)
POC LYMPH PERCENT: 24.3 %L (ref 10–50)
POC MID %: 16.3 % — AB (ref 0–12)
Platelet Count, POC: 112 10*3/uL — AB (ref 142–424)
RBC: 3.95 M/uL — AB (ref 4.04–5.48)
RDW, POC: 16.5 %
WBC: 5.2 10*3/uL (ref 4.6–10.2)

## 2013-03-05 LAB — PRO B NATRIURETIC PEPTIDE: Pro B Natriuretic peptide (BNP): 21512 pg/mL — ABNORMAL HIGH (ref 0–125)

## 2013-03-05 LAB — TROPONIN I

## 2013-03-05 LAB — D-DIMER, QUANTITATIVE: D-Dimer, Quant: 1.89 ug/mL-FEU — ABNORMAL HIGH (ref 0.00–0.48)

## 2013-03-05 MED ORDER — FUROSEMIDE 10 MG/ML IJ SOLN
40.0000 mg | Freq: Once | INTRAMUSCULAR | Status: AC
Start: 2013-03-06 — End: 2013-03-06
  Administered 2013-03-06: 40 mg via INTRAVENOUS
  Filled 2013-03-05: qty 4

## 2013-03-05 NOTE — ED Notes (Signed)
Patient transported to X-ray 

## 2013-03-05 NOTE — Progress Notes (Signed)
   Subjective:    Patient ID: [name] [name], female    DOB: Aug 13, 1950, 63 y.o.   MRN: [id number]  HPI Pt presents to clinic with SOB and cough for the last week.  She has know CHF and kidney failure and was last seen 12/15 and then she stopped her medication (Lasix, KCl and Norvasc) several days after that visit.  She has been taking her Metformin since then.  She has not had cold symptoms recently.  She is unable to lay down because of the SOB and she has been trying to sleep on the couch sitting up but has not been successful.  The cough is dry.  Son interprets the visit and gives a large portion of the history.  Review of Systems  Constitutional: Negative for fever and chills.  HENT: Negative for congestion, postnasal drip and rhinorrhea.   Respiratory: Positive for cough and shortness of breath. Negative for wheezing.   Cardiovascular: Positive for leg swelling (states not terrible for her). Negative for chest pain.  Neurological: Negative for headaches.       Objective:   Physical Exam  Vitals reviewed. Constitutional: She is oriented to person, place, and time. She appears well-developed and well-nourished.  HENT:  Head: Normocephalic and atraumatic.  Right Ear: External ear normal.  Left Ear: External ear normal.  Cardiovascular: Normal rate, regular rhythm and normal heart sounds.   No murmur heard. 2+ pitting edema bilaterally to her knees.  Pre-sacral pitting edema.  Pulmonary/Chest: Effort normal.  Neurological: She is alert and oriented to person, place, and time.  Skin: Skin is warm and dry.  Psychiatric: She has a normal mood and affect. Her behavior is normal. Judgment and thought content normal.   [facility] reading (PRIMARY) by  Dr. Joseph Art.  Bilateral pleural effusion.  Cardiomegaly.     Assessment & Plan:  SOB (shortness of breath) - Plan: DG Chest 2 View  Cough - Plan: DG Chest 2 View, POCT CBC  CHF (congestive heart failure) - Plan: DG Chest 2 View  Due to  her uncontrolled CHF and current fluid overload - to ED for diuresis.  Family Practice service is capped so direct admit is not possible.  D/w pt's son the importance on continuity care for her chronic complicated medical problems.  Windell Hummingbird PA-C 03/05/2013 8:35 PM

## 2013-03-05 NOTE — Patient Instructions (Signed)
Patient needs to go directly to the Emergency Department at [facility].  [address] [place], [place]

## 2013-03-05 NOTE — ED Notes (Signed)
Patient speaks [name]; Pt son at bedside who speaks [place]

## 2013-03-05 NOTE — ED Provider Notes (Signed)
CSN: [id number]     Arrival date & time 03/05/13  2026 History   First MD Initiated Contact with Patient 03/05/13 2136     Chief Complaint  Patient presents with  . Shortness of Breath   (Consider location/radiation/quality/duration/timing/severity/associated sxs/prior Treatment) Patient is a 63 y.o. female presenting with shortness of breath. The history is provided by the patient and a relative. A language interpreter was used (Son).  Shortness of Breath  She presents for evaluation of orthopnea present for several days. She stopped taking amlodipine and Lasix about 2 months ago because they seem to be bothering her. She has had decreased appetite for several days. Her son thinks that she may be depressed and stressed because her husband moved to [place] last year. She was seen by her PCP, today, who did an x-ray that showed worsening pleural effusions, so she was sent here for evaluation. There has been no chest pain. She has a mild, nonproductive cough. There are no other known modifying factors.  Past Medical History  Diagnosis Date  . Diabetes mellitus     Reportedly diagnosed 2011 but no medication initiated until 04/2011 (Metformin)  . Hypertension   . Pneumonia   . Diabetic peripheral neuropathy     Since 2013  . CHF (congestive heart failure) 08/2012  . Anemia 08/2012  . UTI (urinary tract infection) 08/22/2012   Past Surgical History  Procedure Laterality Date  . Cesarean section     Family History  Problem Relation Age of Onset  . Diabetes Brother     deceased in [id number] dt DM complications   History  Substance Use Topics  . Smoking status: Never Smoker   . Smokeless tobacco: Never Used  . Alcohol Use: No   OB History   Grav Para Term Preterm Abortions TAB SAB Ect Mult Living                 Review of Systems  Respiratory: Positive for shortness of breath.   All other systems reviewed and are negative.    Allergies  Review of patient's allergies indicates no  known allergies.  Home Medications   Current Outpatient Rx  Name  Route  Sig  Dispense  Refill  . metFORMIN (GLUCOPHAGE) 500 MG tablet   Oral   Take 250 mg by mouth 2 (two) times daily with a meal.          . OVER THE COUNTER MEDICATION   Oral   Take 1 tablet by mouth 2 (two) times daily. One a day vitamin for diabetics         . OVER THE COUNTER MEDICATION   Oral   Take 15 mLs by mouth daily as needed (cough). Over the counter cough syrup          BP 153/74  Pulse 70  Temp(Src) 97.6 F (36.4 C) (Oral)  Resp 23  SpO2 97% Physical Exam  Nursing note and vitals reviewed. Constitutional: She is oriented to person, place, and time. She appears well-developed.  Elderly frail  HENT:  Head: Normocephalic and atraumatic.  Eyes: Conjunctivae and EOM are normal. Pupils are equal, round, and reactive to light.  Neck: Normal range of motion and phonation normal. Neck supple.  Cardiovascular: Normal rate, regular rhythm and intact distal pulses.   Pulmonary/Chest: Effort normal and breath sounds normal. She exhibits no tenderness.  Persistent nonproductive cough.  Abdominal: Soft. She exhibits no distension. There is no tenderness. There is no guarding.  Musculoskeletal: Normal range  of motion. She exhibits edema (2 post bilateral legs).  Neurological: She is alert and oriented to person, place, and time. She exhibits normal muscle tone.  Skin: Skin is warm and dry.  Psychiatric: Her behavior is normal. Judgment and thought content normal.  She appears depressed    ED Course  Procedures (including critical care time) Medications  furosemide (LASIX) injection 40 mg (not administered)    Patient Vitals for the past 24 hrs:  BP Temp Temp src Pulse Resp SpO2  03/05/13 2330 153/74 mmHg - - 70 23 97 %  03/05/13 2245 167/79 mmHg - - 70 23 96 %  03/05/13 2130 159/70 mmHg - - 73 23 96 %  03/05/13 2046 180/87 mmHg - - 76 22 99 %  03/05/13 2038 181/84 mmHg 97.6 F (36.4 C) Oral  77 24 98 %    10:01 PM Reevaluation with update and discussion. After initial assessment and treatment, an updated evaluation reveals no change in status. Emon Lance L   11:16 PM-Consult complete with Dr, Ernestina Patches. Patient case explained and discussed. He agrees to admit patient for further evaluation and treatment. Call ended at [facility] - Abnormal; Notable for the following:    Pro B Natriuretic peptide (BNP) 21512.0 (*)    All other components within normal limits  D-DIMER, QUANTITATIVE - Abnormal; Notable for the following:    D-Dimer, Quant 1.89 (*)    All other components within normal limits  POCT I-STAT, CHEM 8 - Abnormal; Notable for the following:    BUN 37 (*)    Creatinine, Ser 1.30 (*)    Glucose, Bld 128 (*)    Hemoglobin 11.9 (*)    HCT 35.0 (*)    All other components within normal limits  TROPONIN I   Imaging Review Dg Chest 2 View  03/05/2013   CLINICAL DATA:  Bilateral pleural effusions and cardiomegaly. Cough without CHF.  EXAM: CHEST  2 VIEW  COMPARISON:  01/19/2013.  FINDINGS: Little change in the chest radiograph compared to prior. The cardiopericardial silhouette is enlarged and partially obscured by would appear to be bilateral subpulmonic pleural effusions. There is blunting of both costophrenic angles. The effusions may be loculated anteriorly. The pulmonary vascularity is mildly increased. Aortic arch atherosclerosis.  IMPRESSION: Cardiomegaly. Likely anteriorly loculated bilateral chronic pleural effusions, little changed from 01/19/2013.   Electronically Signed   By: Dereck Ligas M.D.   On: 03/05/2013 20:14   Dg Chest Right Decubitus  03/05/2013   CLINICAL DATA:  Evaluate effusions.  EXAM: CHEST - RIGHT DECUBITUS  COMPARISON:  Same day left-sided down radiograph and PA and lateral radiographs of the chest.  FINDINGS: With the right side down, the right pleural effusion layers dependently. The majority of  the left pleural effusion layers however there is a portion that persists along the periphery of the left chest wall.  IMPRESSION: Layering right pleural effusion.  Layering and partially loculated left pleural effusion along the left chest wall.   Electronically Signed   By: Carlos Levering M.D.   On: 03/05/2013 22:51   Dg Chest Left Decubitus  03/05/2013   CLINICAL DATA:  Evaluate effusions  EXAM: CHEST - LEFT DECUBITUS  COMPARISON:  03/05/2013 chest radiograph  FINDINGS: Left side down radiograph shows the pleural effusions to layer dependently. No appreciable pneumothorax. Cardiac enlargement. Central vascular congestion. Perihilar consolidation on the right appear  IMPRESSION: Layering pleural effusions with left side down.  Right perihilar  vascular congestion and consolidation; pneumonia or atelectasis. Recommend follow up to document resolution.   Electronically Signed   By: Carlos Levering M.D.   On: 03/05/2013 22:49    EKG Interpretation    Date/Time:  Tuesday March 05 2013 20:31:09 EST Ventricular Rate:  77 PR Interval:  194 QRS Duration: 98 QT Interval:  390 QTC Calculation: 441 R Axis:   108 Text Interpretation:  Normal sinus rhythm Rightward axis Low voltage QRS Cannot rule out Anterior infarct , age undetermined Abnormal ECG since last tracing no significant change Confirmed by Eulis Foster  MD, Jerrod Damiano ([id number]) on 03/05/2013 10:02:26 PM            MDM  No diagnosis found.   Pleural effusions, source unclear. She is hemodynamically stable. She is a markedly elevated BNP and a slightly elevated d-dimer. She needs to be admitted for further diagnostic evaluation, and treatment   Nursing Notes Reviewed/ Care Coordinated, and agree without changes. Applicable Imaging Reviewed. Radiologic imaging report reviewed and images by radiography  - viewed, by me. Interpretation of Laboratory Data incorporated into ED treatment   Plan: admit  Richarda Blade, MD 03/05/13 2356

## 2013-03-05 NOTE — ED Notes (Signed)
Pt. reports progressing SOB with productive cough and chest tightness for several days , seen at an urgent care today chest x-ray done and CBC with diff done . Son translating ( [name] ) for pt. History of CHF .

## 2013-03-06 DIAGNOSIS — I509 Heart failure, unspecified: Secondary | ICD-10-CM

## 2013-03-06 DIAGNOSIS — J90 Pleural effusion, not elsewhere classified: Secondary | ICD-10-CM

## 2013-03-06 DIAGNOSIS — I359 Nonrheumatic aortic valve disorder, unspecified: Secondary | ICD-10-CM

## 2013-03-06 LAB — CBC WITH DIFFERENTIAL/PLATELET
BASOS ABS: 0.1 10*3/uL (ref 0.0–0.1)
Basophils Relative: 1 % (ref 0–1)
EOS ABS: 1.3 10*3/uL — AB (ref 0.0–0.7)
EOS PCT: 24 % — AB (ref 0–5)
HCT: 31.8 % — ABNORMAL LOW (ref 36.0–46.0)
HEMOGLOBIN: 10.8 g/dL — AB (ref 12.0–15.0)
Lymphocytes Relative: 27 % (ref 12–46)
Lymphs Abs: 1.5 10*3/uL (ref 0.7–4.0)
MCH: 30.2 pg (ref 26.0–34.0)
MCHC: 34 g/dL (ref 30.0–36.0)
MCV: 88.8 fL (ref 78.0–100.0)
MONO ABS: 0.4 10*3/uL (ref 0.1–1.0)
Monocytes Relative: 8 % (ref 3–12)
NEUTROS ABS: 2.2 10*3/uL (ref 1.7–7.7)
Neutrophils Relative %: 40 % — ABNORMAL LOW (ref 43–77)
Platelets: 127 10*3/uL — ABNORMAL LOW (ref 150–400)
RBC: 3.58 MIL/uL — AB (ref 3.87–5.11)
RDW: 16.4 % — ABNORMAL HIGH (ref 11.5–15.5)
WBC: 5.5 10*3/uL (ref 4.0–10.5)

## 2013-03-06 LAB — COMPREHENSIVE METABOLIC PANEL
ALBUMIN: 2.8 g/dL — AB (ref 3.5–5.2)
ALT: 13 U/L (ref 0–35)
AST: 20 U/L (ref 0–37)
Alkaline Phosphatase: 87 U/L (ref 39–117)
BUN: 38 mg/dL — AB (ref 6–23)
CHLORIDE: 105 meq/L (ref 96–112)
CO2: 23 mEq/L (ref 19–32)
CREATININE: 1.35 mg/dL — AB (ref 0.50–1.10)
Calcium: 8.4 mg/dL (ref 8.4–10.5)
GFR calc Af Amer: 47 mL/min — ABNORMAL LOW (ref >90)
GFR calc non Af Amer: 41 mL/min — ABNORMAL LOW (ref >90)
GLUCOSE: 116 mg/dL — AB (ref 70–99)
Potassium: 4.8 mEq/L (ref 3.7–5.3)
Sodium: 140 mEq/L (ref 137–147)
Total Bilirubin: 0.4 mg/dL (ref 0.3–1.2)
Total Protein: 7 g/dL (ref 6.0–8.3)

## 2013-03-06 LAB — TROPONIN I
Troponin I: <0.3 ng/mL (ref <0.30)
Troponin I: <0.3 ng/mL (ref <0.30)
Troponin I: <0.3 ng/mL (ref <0.30)

## 2013-03-06 LAB — GLUCOSE, CAPILLARY
GLUCOSE-CAPILLARY: 123 mg/dL — AB (ref 70–99)
GLUCOSE-CAPILLARY: 112 mg/dL — AB (ref 70–99)
Glucose-Capillary: 99 mg/dL (ref 70–99)

## 2013-03-06 LAB — HEMOGLOBIN A1C
Hgb A1c MFr Bld: 6.8 % — ABNORMAL HIGH (ref <5.7)
Mean Plasma Glucose: 148 mg/dL — ABNORMAL HIGH (ref <117)

## 2013-03-06 MED ORDER — FUROSEMIDE 10 MG/ML IJ SOLN
20.0000 mg | Freq: Two times a day (BID) | INTRAMUSCULAR | Status: DC
  Administered 2013-03-06 – 2013-03-07 (×3): 20 mg via INTRAVENOUS
  Filled 2013-03-06 (×4): qty 2

## 2013-03-06 MED ORDER — AMLODIPINE BESYLATE 5 MG PO TABS
5.0000 mg | ORAL_TABLET | Freq: Every day | ORAL | Status: DC
  Administered 2013-03-06: 5 mg via ORAL
  Filled 2013-03-06: qty 1

## 2013-03-06 MED ORDER — SODIUM CHLORIDE 0.9 % IV SOLN: 250.0000 mL | INTRAVENOUS | Status: DC | PRN

## 2013-03-06 MED ORDER — SODIUM CHLORIDE 0.9 % IJ SOLN
3.0000 mL | Freq: Two times a day (BID) | INTRAMUSCULAR | Status: DC
  Administered 2013-03-06 – 2013-03-14 (×10): 3 mL via INTRAVENOUS

## 2013-03-06 MED ORDER — INSULIN ASPART 100 UNIT/ML ~~LOC~~ SOLN
0.0000 [IU] | Freq: Three times a day (TID) | SUBCUTANEOUS | Status: DC
  Administered 2013-03-06 – 2013-03-07 (×2): 1 [IU] via SUBCUTANEOUS
  Administered 2013-03-09: 2 [IU] via SUBCUTANEOUS
  Administered 2013-03-09: 1 [IU] via SUBCUTANEOUS
  Administered 2013-03-10: 2 [IU] via SUBCUTANEOUS
  Administered 2013-03-10: 1 [IU] via SUBCUTANEOUS
  Administered 2013-03-11: 2 [IU] via SUBCUTANEOUS
  Administered 2013-03-12 – 2013-03-13 (×2): 1 [IU] via SUBCUTANEOUS
  Administered 2013-03-13: 2 [IU] via SUBCUTANEOUS
  Administered 2013-03-13 – 2013-03-15 (×4): 1 [IU] via SUBCUTANEOUS
  Administered 2013-03-15: 2 [IU] via SUBCUTANEOUS
  Administered 2013-03-16: 1 [IU] via SUBCUTANEOUS
  Administered 2013-03-16 (×2): 3 [IU] via SUBCUTANEOUS
  Administered 2013-03-17 (×2): 1 [IU] via SUBCUTANEOUS
  Administered 2013-03-17: 2 [IU] via SUBCUTANEOUS
  Administered 2013-03-18: 1 [IU] via SUBCUTANEOUS
  Administered 2013-03-18: 2 [IU] via SUBCUTANEOUS
  Administered 2013-03-18: 1 [IU] via SUBCUTANEOUS

## 2013-03-06 MED ORDER — ENOXAPARIN SODIUM 60 MG/0.6ML ~~LOC~~ SOLN
60.0000 mg | Freq: Once | SUBCUTANEOUS | Status: AC
  Administered 2013-03-06: 60 mg via SUBCUTANEOUS
  Filled 2013-03-06: qty 0.6

## 2013-03-06 MED ORDER — SODIUM CHLORIDE 0.9 % IJ SOLN: 3.0000 mL | INTRAMUSCULAR | Status: DC | PRN

## 2013-03-06 MED ORDER — MAGNESIUM SULFATE 40 MG/ML IJ SOLN
2.0000 g | Freq: Once | INTRAMUSCULAR | Status: AC
  Administered 2013-03-06: 2 g via INTRAVENOUS
  Filled 2013-03-06: qty 50

## 2013-03-06 MED ORDER — METOPROLOL TARTRATE 50 MG PO TABS
50.0000 mg | ORAL_TABLET | Freq: Two times a day (BID) | ORAL | Status: DC
  Administered 2013-03-06 – 2013-03-13 (×9): 50 mg via ORAL
  Filled 2013-03-06 (×15): qty 1

## 2013-03-06 MED ORDER — SODIUM CHLORIDE 0.9 % IJ SOLN
3.0000 mL | Freq: Two times a day (BID) | INTRAMUSCULAR | Status: DC
  Administered 2013-03-06 – 2013-03-17 (×11): 3 mL via INTRAVENOUS
  Administered 2013-03-18: 22:00:00 via INTRAVENOUS
  Administered 2013-03-18: 3 mL via INTRAVENOUS

## 2013-03-06 MED ORDER — INFLUENZA VAC SPLIT QUAD 0.5 ML IM SUSP
0.5000 mL | INTRAMUSCULAR | Status: AC
  Administered 2013-03-07: 0.5 mL via INTRAMUSCULAR
  Filled 2013-03-06: qty 0.5

## 2013-03-06 NOTE — H&P (Signed)
Hospitalist Admission History and Physical  Patient name: [name] [name] Medical record number: [id number] Date of birth: 11/04/1950 Age: 63 y.o. Gender: female  Primary Care Provider: [facility]  Chief Complaint: cough, CHF exacerbation  History of Present Illness:This is a 63 y.o. year old female with significant past medical history of CHF (ECHO 08/2012 with diffuse hypokinesis, LVH, EF 50-55%, mild aortic regurg, mild-moderate mitral regurg- no formal dx systolic/diastolic dysfunction). Pt initially seen at [facility] for CHF exacerbation 01/2013. Clinically improved with lasix. Lasix had to be held 2/2 renal failure. Per grandson, pt has been off of diuretic for approx  3 weeks. Pt has had progressive cough over past week with worsening LE edema. Was seen at [facility] again today with sxs. Had bilateral chronic pleural effusions with layering on CXR. Was subsequently sent to ER.  In ER, pro BNP @ 21K. Trop neg x1. D-dimer also increased @ 1.89. No tachycardia, hypoxia or CP on presentation. L decubitus CXR with perihilar vascular congestion and consolidation concerning for atelectasis vs. PNA. R decubitus CXR w/ layering R pleural effusion w/ partially loculated L pleural effusion.    Patient Active Problem List   Diagnosis Date Noted  . CHF exacerbation 03/06/2013  . Pleural effusion, bilateral 03/05/2013  . Acute CHF 08/22/2012  . AKI (acute kidney injury) 08/22/2012  . Anemia 08/22/2012  . Diabetes mellitus, type 2 08/22/2012   Past Medical History: Past Medical History  Diagnosis Date  . Diabetes mellitus     Reportedly diagnosed 2011 but no medication initiated until 04/2011 (Metformin)  . Hypertension   . Pneumonia   . Diabetic peripheral neuropathy     Since 2013  . CHF (congestive heart failure) 08/2012  . Anemia 08/2012  . UTI (urinary tract infection) 08/22/2012    Past Surgical History: Past Surgical History  Procedure Laterality Date  . Cesarean section      Social History: History    Social History  . Marital Status: Married    Spouse Name: N/A    Number of Children: N/A  . Years of Education: N/A   Social History Main Topics  . Smoking status: Never Smoker   . Smokeless tobacco: Never Used  . Alcohol Use: No  . Drug Use: No  . Sexual Activity: None   Other Topics Concern  . None   Social History Narrative   Patient lives with husband and son. Currently without insurance and therefore tries not seek medical care dt financial concern.     Family History: Family History  Problem Relation Age of Onset  . Diabetes Brother     deceased in 99B dt DM complications    Allergies: No Known Allergies  Current Facility-Administered Medications  Medication Dose Route Frequency Provider Last Rate Last Dose  . 0.9 %  sodium chloride infusion  250 mL Intravenous PRN Shanda Howells, MD      . furosemide (LASIX) injection 20 mg  20 mg Intravenous Q12H Shanda Howells, MD      . furosemide (LASIX) injection 40 mg  40 mg Intravenous Once Richarda Blade, MD      . insulin aspart (novoLOG) injection 0-9 Units  0-9 Units Subcutaneous TID WC Shanda Howells, MD      . sodium chloride 0.9 % injection 3 mL  3 mL Intravenous Q12H Shanda Howells, MD      . sodium chloride 0.9 % injection 3 mL  3 mL Intravenous Q12H Shanda Howells, MD      . sodium chloride 0.9 %  injection 3 mL  3 mL Intravenous PRN Shanda Howells, MD       Current Outpatient Prescriptions  Medication Sig Dispense Refill  . metFORMIN (GLUCOPHAGE) 500 MG tablet Take 250 mg by mouth 2 (two) times daily with a meal.       . OVER THE COUNTER MEDICATION Take 1 tablet by mouth 2 (two) times daily. One a day vitamin for diabetics      . OVER THE COUNTER MEDICATION Take 15 mLs by mouth daily as needed (cough). Over the counter cough syrup       Review Of Systems: 12 point ROS negative except as noted above in HPI.  Physical Exam: Filed Vitals:   03/06/13 0000  BP: 153/81  Pulse: 72  Temp:   Resp: 24    General:  alert and cooperative HEENT: PERRLA and extra ocular movement intact, mild JVD Heart: S1, S2 normal, no murmur, rub or gallop, regular rate and rhythm Lungs: unlabored breathing and bibasilar crackles  Abdomen: abdomen is soft without significant tenderness, masses, organomegaly or guarding Extremities: 1-2 + LE edema bilaterally  Skin:no rashes Neurology: normal without focal findings  Labs and Imaging: Lab Results  Component Value Date/Time   NA 137 03/05/2013 11:50 PM   K 4.8 03/05/2013 11:50 PM   CL 107 03/05/2013 11:50 PM   CO2 29 01/27/2013  1:15 PM   BUN 37* 03/05/2013 11:50 PM   CREATININE 1.30* 03/05/2013 11:50 PM   CREATININE 1.57* 01/27/2013  1:15 PM   GLUCOSE 128* 03/05/2013 11:50 PM   Lab Results  Component Value Date   WBC 5.2 03/05/2013   HGB 11.9* 03/05/2013   HCT 35.0* 03/05/2013   MCV 95.0 03/05/2013   PLT 198 08/23/2012   Pro BNP: 21K   Dg Chest 2 View  03/05/2013   CLINICAL DATA:  Bilateral pleural effusions and cardiomegaly. Cough without CHF.  EXAM: CHEST  2 VIEW  COMPARISON:  01/19/2013.  FINDINGS: Little change in the chest radiograph compared to prior. The cardiopericardial silhouette is enlarged and partially obscured by would appear to be bilateral subpulmonic pleural effusions. There is blunting of both costophrenic angles. The effusions may be loculated anteriorly. The pulmonary vascularity is mildly increased. Aortic arch atherosclerosis.  IMPRESSION: Cardiomegaly. Likely anteriorly loculated bilateral chronic pleural effusions, little changed from 01/19/2013.   Electronically Signed   By: Dereck Ligas M.D.   On: 03/05/2013 20:14   Dg Chest Right Decubitus  03/05/2013   CLINICAL DATA:  Evaluate effusions.  EXAM: CHEST - RIGHT DECUBITUS  COMPARISON:  Same day left-sided down radiograph and PA and lateral radiographs of the chest.  FINDINGS: With the right side down, the right pleural effusion layers dependently. The majority of the left pleural effusion layers  however there is a portion that persists along the periphery of the left chest wall.  IMPRESSION: Layering right pleural effusion.  Layering and partially loculated left pleural effusion along the left chest wall.   Electronically Signed   By: Carlos Levering M.D.   On: 03/05/2013 22:51   Dg Chest Left Decubitus  03/05/2013   CLINICAL DATA:  Evaluate effusions  EXAM: CHEST - LEFT DECUBITUS  COMPARISON:  03/05/2013 chest radiograph  FINDINGS: Left side down radiograph shows the pleural effusions to layer dependently. No appreciable pneumothorax. Cardiac enlargement. Central vascular congestion. Perihilar consolidation on the right appear  IMPRESSION: Layering pleural effusions with left side down.  Right perihilar vascular congestion and consolidation; pneumonia or atelectasis. Recommend follow up to document  resolution.   Electronically Signed   By: Carlos Levering M.D.   On: 03/05/2013 22:49     Assessment and Plan: [name] [name] is a 63 y.o. year old female presenting with CHF exacerbation, pleural effusions.  Cough: Likely 2/2 CHF exacerbation. Volume overloaded on exam.  Will gently diurese with lasix. Follow Cr closely. Strict Is and Os. Daily weights. Consider thoracentesis if sxs/pleural effusion persist despite treatment. No clinical indications for abx. No fever, leukocytosis. Noted elevated d-dimer. Wells score around 1-2. Full dose lovenox x1. VQ scan likely not of good benefit given pleural effusions. May benefit from CTA chest, but will defer given renal function. Overall stable resp exam currently. Reassess in am. Cycle CEs.   HTN: Elevated BP. Uncontrolled BP and medication non compliance likely major contributor to sxs. Restart norvasc.   DM: A1C. SSI.   FEN/GI: low Na diet.  Prophylaxis: Lovenox full dose x 1. Disposition: pending further evaluation.  Code Status:Full Code.        Shanda Howells MD  Pager: [phone]

## 2013-03-06 NOTE — Consult Note (Signed)
Name: [name] [name] MRN: [id number] DOB: January 10, 1951    ADMISSION DATE:  03/05/2013 CONSULTATION DATE:  1/28  REFERRING MD :  [facility] PRIMARY SERVICE:  [facility]  CHIEF COMPLAINT:  Cough and peripheral edema.  BRIEF PATIENT DESCRIPTION: 63 year old female with PMH of CHF, CKD, and DM. She was admitted to [facility] on 1/27 after developing cough and peripheral edema. On CXR she was found to have bilateral pleural effusions. PCCM has been asked to see.   SIGNIFICANT EVENTS / STUDIES:  08/22/12  2D Echo - EF 50-55%, diffuse hypokinesis of LV. Ware, MR.  03/05/13  Admitted to [facility]   03/06/13  2D Echo >>>  LINES / TUBES: PIV  CULTURES: None  ANTIBIOTICS: None  HISTORY OF PRESENT ILLNESS: This is a 63 y.o. year old female with  past medical history of CHF (ECHO 08/2012 with diffuse hypokinesis, LVH, EF 50-55%, mild aortic regurg, mild-moderate mitral regurg- no formal dx systolic/diastolic dysfunction), CKD, and DM. Pt initially seen at PCP for CHF exacerbation 01/2013. Clinically improved with lasix. Lasix had to be held 2/2 renal failure. Per grandson, pt has been off of diuretic for approx 3 weeks. Pt has had progressive cough over past week with worsening LE edema. Was seen at PCP again 1/27 with sxs. Had bilateral chronic pleural effusions with layering on CXR. Was subsequently sent to ER.   In ER, pro BNP @ 21K. Trop neg x1. D-dimer also increased @ 1.89. No tachycardia, hypoxia or CP on presentation. She reports she has not been able to ambulate for long distances for some time, but was not able to verbalize a time frame. Walking outside of her house would make her short of breath for at least the last month.    PAST MEDICAL HISTORY :  Past Medical History  Diagnosis Date  . Diabetes mellitus     Reportedly diagnosed 2011 but no medication initiated until 04/2011 (Metformin)  . Hypertension   . Pneumonia   . Diabetic peripheral neuropathy     Since 2013  . CHF (congestive heart failure)  08/2012  . Anemia 08/2012  . UTI (urinary tract infection) 08/22/2012   Past Surgical History  Procedure Laterality Date  . Cesarean section     Prior to Admission medications   Medication Sig Start Date End Date Taking? Authorizing Provider  metFORMIN (GLUCOPHAGE) 500 MG tablet Take 250 mg by mouth 2 (two) times daily with a meal.    Yes Historical Provider, MD  OVER THE COUNTER MEDICATION Take 1 tablet by mouth 2 (two) times daily. One a day vitamin for diabetics   Yes Historical Provider, MD  OVER THE COUNTER MEDICATION Take 15 mLs by mouth daily as needed (cough). Over the counter cough syrup   Yes Historical Provider, MD   No Known Allergies  FAMILY HISTORY:  Family History  Problem Relation Age of Onset  . Diabetes Brother     deceased in 66A dt DM complications   SOCIAL HISTORY:  reports that she has never smoked. She has never used smokeless tobacco. She reports that she does not drink alcohol or use illicit drugs.  Review of Systems:   Bolds are positive  Constitutional: weight loss, gain, night sweats, Fevers, chills, fatigue .  HEENT: headaches, Sore throat, sneezing, nasal congestion, post nasal drip, Difficulty swallowing, Tooth/dental problems, visual complaints visual changes, ear ache CV:  chest pain, radiates: ,Orthopnea, PND, swelling in lower extremities, dizziness, palpitations, syncope.  GI  heartburn, indigestion, abdominal pain,  nausea, vomiting, diarrhea, change in bowel habits, loss of appetite, bloody stools.  Resp: cough, non-productive: , hemoptysis, dyspnea, chest pain, pleuritic.  Skin: rash or itching or icterus GU: dysuria, change in color of urine, urgency or frequency. flank pain, hematuria  MS: joint pain or swelling. decreased range of motion  Psych: change in mood or affect. depression or anxiety.  Neuro: difficulty with speech, weakness, numbness, ataxia    SUBJECTIVE: States that she is not currently short of breath.   VITAL SIGNS: Temp:   [97.6 F (36.4 C)-98.1 F (36.7 C)] 97.7 F (36.5 C) (01/28 0207) Pulse Rate:  [69-77] 69 (01/28 1052) Resp:  [18-24] 18 (01/28 0207) BP: (153-181)/(70-89) 159/89 mmHg (01/28 1052) SpO2:  [96 %-99 %] 98 % (01/28 0207) Weight:  [55.611 kg (122 lb 9.6 oz)-56 kg (123 lb 7.3 oz)] 56 kg (123 lb 7.3 oz) (01/28 0100)  PHYSICAL EXAMINATION: General:  63 year old female in mild respiratory distress.  Neuro:  Awake Alert Oriented.  HEENT:  NCAT Neck:  Supple, no JVD noted Cardiovascular:  RRR, 1+ bilateral LE edema. Lungs:  Fine bibasilar crackles Abdomen:  Soft, non-tender, non-distended. Musculoskeletal:  Intact Skin:  Intact   Recent Labs Lab 03/05/13 2350 03/06/13 0532  NA 137 140  K 4.8 4.8  CL 107 105  CO2  --  23  BUN 37* 38*  CREATININE 1.30* 1.35*  GLUCOSE 128* 116*    Recent Labs Lab 03/05/13 1916 03/05/13 2350 03/06/13 0532  HGB 11.3* 11.9* 10.8*  HCT 37.5* 35.0* 31.8*  WBC 5.2  --  5.5  PLT  --   --  127*   Dg Chest 2 View  03/05/2013   CLINICAL DATA:  Bilateral pleural effusions and cardiomegaly. Cough without CHF.  EXAM: CHEST  2 VIEW  COMPARISON:  01/19/2013.  FINDINGS: Little change in the chest radiograph compared to prior. The cardiopericardial silhouette is enlarged and partially obscured by would appear to be bilateral subpulmonic pleural effusions. There is blunting of both costophrenic angles. The effusions may be loculated anteriorly. The pulmonary vascularity is mildly increased. Aortic arch atherosclerosis.  IMPRESSION: Cardiomegaly. Likely anteriorly loculated bilateral chronic pleural effusions, little changed from 01/19/2013.   Electronically Signed   By: Dereck Ligas M.D.   On: 03/05/2013 20:14   Dg Chest Right Decubitus  03/05/2013   CLINICAL DATA:  Evaluate effusions.  EXAM: CHEST - RIGHT DECUBITUS  COMPARISON:  Same day left-sided down radiograph and PA and lateral radiographs of the chest.  FINDINGS: With the right side down, the right  pleural effusion layers dependently. The majority of the left pleural effusion layers however there is a portion that persists along the periphery of the left chest wall.  IMPRESSION: Layering right pleural effusion.  Layering and partially loculated left pleural effusion along the left chest wall.   Electronically Signed   By: Carlos Levering M.D.   On: 03/05/2013 22:51   Dg Chest Left Decubitus  03/05/2013   CLINICAL DATA:  Evaluate effusions  EXAM: CHEST - LEFT DECUBITUS  COMPARISON:  03/05/2013 chest radiograph  FINDINGS: Left side down radiograph shows the pleural effusions to layer dependently. No appreciable pneumothorax. Cardiac enlargement. Central vascular congestion. Perihilar consolidation on the right appear  IMPRESSION: Layering pleural effusions with left side down.  Right perihilar vascular congestion and consolidation; pneumonia or atelectasis. Recommend follow up to document resolution.   Electronically Signed   By: Carlos Levering M.D.   On: 03/05/2013 22:49    ASSESSMENT /  PLAN:  Acute on [place] CHF - Management per primary team.   Bilateral Pleural Effusions - via CXR Right pleural effusion appears free flowing and Left is potentially loculated. Most likely cause is exacerbation of CHF given pro-BNP was 21512 on admission.      - Supplemental O2 as needed. - F/u Echo - Diuresis as tolerated in setting of kidney disease - She is a potential candidate for thoracentesis, will evaluate with ultrasound. If performed, will evaluate pleural fluid for definitive etiology.   Georgann Housekeeper, ACNP [facility] Pager [phone] or [phone]  Will return back and evaluate effusion with U/S but agree patient needs diureses.  However, given that effusion has never been evaluated in the past may benefit from a thoracentesis.  Patient seen and examined, agree with above note.  I dictated the care and orders written for this patient under my direction.  Rush Farmer, MD [phone]

## 2013-03-06 NOTE — Progress Notes (Signed)
Pt assisted up to BR at this time; pt had BM, also voided; measuring hat in toilet, yet pt voided in toilet, missed hat; this is the second time this has happened today; will cont. To attempt to measure urine output.

## 2013-03-06 NOTE — Progress Notes (Signed)
ANTICOAGULATION CONSULT NOTE - Initial Consult  Pharmacy Consult for Lovenox Indication: R/O PE  No Known Allergies  Patient Measurements: Height: 4' 11.06" (150 cm) Weight: 123 lb 7.3 oz (56 kg) IBW/kg (Calculated) : 43.33  Vital Signs: Temp: 97.6 F (36.4 C) (01/27 2038) Temp src: Oral (01/27 2038) BP: 158/74 mmHg (01/28 0100) Pulse Rate: 71 (01/28 0100)  Labs:  Recent Labs  03/05/13 1916 03/05/13 2205 03/05/13 2350  HGB 11.3*  --  11.9*  HCT 37.5*  --  35.0*  CREATININE  --   --  1.30*  TROPONINI  --  <0.30  --     Estimated Creatinine Clearance: 33.8 ml/min (by C-G formula based on Cr of 1.3).   Medical History: Past Medical History  Diagnosis Date  . Diabetes mellitus     Reportedly diagnosed 2011 but no medication initiated until 04/2011 (Metformin)  . Hypertension   . Pneumonia   . Diabetic peripheral neuropathy     Since 2013  . CHF (congestive heart failure) 08/2012  . Anemia 08/2012  . UTI (urinary tract infection) 08/22/2012    Medications:  Norvasc  Metformin    Assessment: 63 yo female with SOB, elevated D-Dimer, poss PE, for Lovenox  Goal of Therapy:  Anti-Xa level 0.6-1 units/ml 4hrs after LMWH dose given Monitor platelets by anticoagulation protocol: Yes   Plan:  Lovenox 60 mg SQ x 1 dose F/U plan for anticoagulation  Bernis Stecher, [place] Curb 03/06/2013,1:51 AM

## 2013-03-06 NOTE — Progress Notes (Signed)
Pt lying in bed; 16 beats Vtach; pt asymptomatic; VSS; will page MD to make aware.

## 2013-03-06 NOTE — Progress Notes (Addendum)
He had a little this morning. Evaluated, pulmonary called for loculated effusion, continue gentle diuresis.   16 beat of NSVT- asymtomatic, Last Ef>50% in 2014 , 2gms Mag IV now, K stable, place on B Blocker, Tele,   repeat lytes in am, cycle Trop.

## 2013-03-06 NOTE — Progress Notes (Signed)
Echocardiogram 2D Echocardiogram has been performed.  [name] [name] 03/06/2013, 4:31 PM

## 2013-03-07 ENCOUNTER — Inpatient Hospital Stay (HOSPITAL_COMMUNITY): Payer: Medicaid Other

## 2013-03-07 LAB — CBC WITH DIFFERENTIAL/PLATELET
Basophils Absolute: 0.1 10*3/uL (ref 0.0–0.1)
Basophils Relative: 2 % — ABNORMAL HIGH (ref 0–1)
EOS PCT: 27 % — AB (ref 0–5)
Eosinophils Absolute: 1.4 10*3/uL — ABNORMAL HIGH (ref 0.0–0.7)
HCT: 31.8 % — ABNORMAL LOW (ref 36.0–46.0)
HEMOGLOBIN: 10.6 g/dL — AB (ref 12.0–15.0)
Lymphocytes Relative: 26 % (ref 12–46)
Lymphs Abs: 1.3 10*3/uL (ref 0.7–4.0)
MCH: 29.6 pg (ref 26.0–34.0)
MCHC: 33.3 g/dL (ref 30.0–36.0)
MCV: 88.8 fL (ref 78.0–100.0)
MONOS PCT: 13 % — AB (ref 3–12)
Monocytes Absolute: 0.7 10*3/uL (ref 0.1–1.0)
NEUTROS ABS: 1.6 10*3/uL — AB (ref 1.7–7.7)
NEUTROS PCT: 32 % — AB (ref 43–77)
Platelets: 104 10*3/uL — ABNORMAL LOW (ref 150–400)
RBC: 3.58 MIL/uL — ABNORMAL LOW (ref 3.87–5.11)
RDW: 16.8 % — ABNORMAL HIGH (ref 11.5–15.5)
WBC: 5.1 10*3/uL (ref 4.0–10.5)

## 2013-03-07 LAB — GLUCOSE, CAPILLARY
GLUCOSE-CAPILLARY: 146 mg/dL — AB (ref 70–99)
Glucose-Capillary: 108 mg/dL — ABNORMAL HIGH (ref 70–99)
Glucose-Capillary: 120 mg/dL — ABNORMAL HIGH (ref 70–99)
Glucose-Capillary: 124 mg/dL — ABNORMAL HIGH (ref 70–99)

## 2013-03-07 LAB — MAGNESIUM: MAGNESIUM: 2.9 mg/dL — AB (ref 1.5–2.5)

## 2013-03-07 LAB — BASIC METABOLIC PANEL
BUN: 38 mg/dL — ABNORMAL HIGH (ref 6–23)
CHLORIDE: 104 meq/L (ref 96–112)
CO2: 24 meq/L (ref 19–32)
Calcium: 8.4 mg/dL (ref 8.4–10.5)
Creatinine, Ser: 1.44 mg/dL — ABNORMAL HIGH (ref 0.50–1.10)
GFR calc non Af Amer: 38 mL/min — ABNORMAL LOW (ref >90)
GFR, EST AFRICAN AMERICAN: 44 mL/min — AB (ref >90)
Glucose, Bld: 110 mg/dL — ABNORMAL HIGH (ref 70–99)
POTASSIUM: 4.5 meq/L (ref 3.7–5.3)
SODIUM: 139 meq/L (ref 137–147)

## 2013-03-07 LAB — LIPASE, BLOOD: Lipase: 44 U/L (ref 11–59)

## 2013-03-07 MED ORDER — ONDANSETRON HCL 4 MG/2ML IJ SOLN
4.0000 mg | Freq: Four times a day (QID) | INTRAMUSCULAR | Status: DC | PRN
  Administered 2013-03-07 – 2013-03-16 (×16): 4 mg via INTRAVENOUS
  Filled 2013-03-07 (×14): qty 2

## 2013-03-07 MED ORDER — HEPARIN SODIUM (PORCINE) 5000 UNIT/ML IJ SOLN
5000.0000 [IU] | Freq: Three times a day (TID) | INTRAMUSCULAR | Status: DC
  Administered 2013-03-07 – 2013-03-08 (×3): 5000 [IU] via SUBCUTANEOUS
  Filled 2013-03-07 (×6): qty 1

## 2013-03-07 MED ORDER — ONDANSETRON HCL 4 MG/2ML IJ SOLN
INTRAMUSCULAR | Status: AC
  Administered 2013-03-07: 14:00:00
  Filled 2013-03-07: qty 2

## 2013-03-07 MED ORDER — SODIUM CHLORIDE 0.9 % IJ SOLN
3.0000 mL | Freq: Two times a day (BID) | INTRAMUSCULAR | Status: DC
Start: 2013-03-07 — End: 2013-03-08
  Administered 2013-03-07: 3 mL via INTRAVENOUS

## 2013-03-07 MED ORDER — HYDROCODONE-ACETAMINOPHEN 5-325 MG PO TABS
1.0000 | ORAL_TABLET | Freq: Four times a day (QID) | ORAL | Status: DC | PRN
  Administered 2013-03-07 – 2013-03-17 (×9): 1 via ORAL
  Filled 2013-03-07 (×6): qty 1
  Filled 2013-03-07: qty 2
  Filled 2013-03-07 (×4): qty 1

## 2013-03-07 MED ORDER — SODIUM CHLORIDE 0.9 % IV SOLN
1.0000 mL/kg/h | INTRAVENOUS | Status: DC
  Administered 2013-03-08: 1 mL/kg/h via INTRAVENOUS

## 2013-03-07 MED ORDER — ONDANSETRON HCL 4 MG/2ML IJ SOLN
4.0000 mg | Freq: Four times a day (QID) | INTRAMUSCULAR | Status: DC | PRN
  Filled 2013-03-07: qty 2

## 2013-03-07 MED ORDER — ASPIRIN 81 MG PO CHEW
81.0000 mg | CHEWABLE_TABLET | ORAL | Status: AC
  Administered 2013-03-08: 81 mg via ORAL
  Filled 2013-03-07: qty 1

## 2013-03-07 MED ORDER — SODIUM CHLORIDE 0.9 % IJ SOLN: 3.0000 mL | INTRAMUSCULAR | Status: DC | PRN

## 2013-03-07 MED ORDER — SODIUM CHLORIDE 0.9 % IV SOLN: 250.0000 mL | INTRAVENOUS | Status: DC | PRN

## 2013-03-07 NOTE — Progress Notes (Signed)
Patient Demographics  [name] [name], is a 63 y.o. female, DOB - [birth year], AST:[id number]  Admit date - 03/05/2013   Admitting Physician Shanda Howells, MD  Outpatient Primary MD for the patient is No PCP Per Patient  LOS - 2   Chief Complaint  Patient presents with  . Shortness of Breath        Assessment & Plan    1.SOB due to Pl effusions and acute on chronic systolic and diastolic CHF - likely transudative due to underlying CHF, improved diuresis, question of loculated effusion for which pulmonary is already following the patient.     2. Acute on chronic systolic and diastolic CHF exacerbation with severe mitral valve stenosis noted on TTE done this admission. Patient's EF has dropped considerably in the last 1 year, currently compensated from CHF standpoint after initial Lasix which is now held, her shortness of breath is remarkably improved, cardiology is following the patient and considering right and left heart catheterization. Continue beta blocker, will resume ACE inhibitor after catheterization is done and creatinine remain stable.     3. Chronic kidney disease stage III. Baseline creatinine between 1.4 and 1.5. She is currently at her baseline, monitor cautiously in the setting of dye exposure due to catheterization. We'll hold ACE/ARB till creatinine appears stable post procedure.     4. 16 beat run of asymptomatic V. tach on 03/06/2013. Troponins trended are negative, she has been placed on better blocker, electrolytes including magnesium stable. Cardiology following.      5. Mild right upper quadrant abdominal pain. Liver enzymes are stable, will check right upper quadrant ultrasound and monitor.      6.DM-2 - A1c at 6.8, glycemic control is acceptable and GERD.   CBG  (last 3)   Recent Labs  03/06/13 1632 03/06/13 2132 03/07/13 0619  GLUCAP 123* 112* 108*     Lab Results  Component Value Date   HGBA1C 6.8* 03/06/2013       Code Status: Full  Family Communication: none present  Disposition Plan: Home   Procedures TTE   Consults  Cards, Renal   Medications  Scheduled Meds: . influenza vac split quadrivalent PF  0.5 mL Intramuscular Tomorrow-1000  . insulin aspart  0-9 Units Subcutaneous TID WC  . metoprolol tartrate  50 mg Oral BID  . sodium chloride  3 mL Intravenous Q12H  . sodium chloride  3 mL Intravenous Q12H   Continuous Infusions:  PRN Meds:.sodium chloride, sodium chloride  DVT Prophylaxis    Heparin    Lab Results  Component Value Date   PLT 104* 03/07/2013    Antibiotics    Anti-infectives   None          Subjective:   [name] today has, No headache, No chest pain,   No Nausea, No new weakness tingling or numbness, No Cough - SOB. Mild RUQ abd pain.  Objective:   Filed Vitals:   03/06/13 1710 03/07/13 0100 03/07/13 0439 03/07/13 0440  BP: 155/84 142/62  145/76  Pulse: 68   55  Temp:    97.9 F (36.6 C)  TempSrc:  Oral  Oral  Resp: 16 18  18   Height:      Weight:   52.8 kg (  116 lb 6.5 oz)   SpO2: 98%   95%    Wt Readings from Last 3 Encounters:  03/07/13 52.8 kg (116 lb 6.5 oz)  03/05/13 55.611 kg (122 lb 9.6 oz)  01/21/13 58.151 kg (128 lb 3.2 oz)     Intake/Output Summary (Last 24 hours) at 03/07/13 1021 Last data filed at 03/07/13 0730  Gross per 24 hour  Intake    710 ml  Output    600 ml  Net    110 ml    Exam Awake Alert, Oriented X 3, No new F.N deficits, Normal affect [place].AT,PERRAL Supple Neck,No JVD, No cervical lymphadenopathy appriciated.  Symmetrical Chest wall movement, Good air movement bilaterally, CTAB RRR,No Gallops,Rubs or new Murmurs, No Parasternal Heave +ve B.Sounds, Abd Soft, mild RUQ tenderness, No organomegaly appriciated, No rebound - guarding or  rigidity. No Cyanosis, Clubbing or edema, No new Rash or bruise      Data Review   Micro Results No results found for this or any previous visit (from the past 240 hour(s)).  Radiology Reports Dg Chest 2 View  03/05/2013   CLINICAL DATA:  Bilateral pleural effusions and cardiomegaly. Cough without CHF.  EXAM: CHEST  2 VIEW  COMPARISON:  01/19/2013.  FINDINGS: Little change in the chest radiograph compared to prior. The cardiopericardial silhouette is enlarged and partially obscured by would appear to be bilateral subpulmonic pleural effusions. There is blunting of both costophrenic angles. The effusions may be loculated anteriorly. The pulmonary vascularity is mildly increased. Aortic arch atherosclerosis.  IMPRESSION: Cardiomegaly. Likely anteriorly loculated bilateral chronic pleural effusions, little changed from 01/19/2013.   Electronically Signed   By: Dereck Ligas M.D.   On: 03/05/2013 20:14   Dg Chest Right Decubitus  03/05/2013   CLINICAL DATA:  Evaluate effusions.  EXAM: CHEST - RIGHT DECUBITUS  COMPARISON:  Same day left-sided down radiograph and PA and lateral radiographs of the chest.  FINDINGS: With the right side down, the right pleural effusion layers dependently. The majority of the left pleural effusion layers however there is a portion that persists along the periphery of the left chest wall.  IMPRESSION: Layering right pleural effusion.  Layering and partially loculated left pleural effusion along the left chest wall.   Electronically Signed   By: Carlos Levering M.D.   On: 03/05/2013 22:51   Dg Chest Left Decubitus  03/05/2013   CLINICAL DATA:  Evaluate effusions  EXAM: CHEST - LEFT DECUBITUS  COMPARISON:  03/05/2013 chest radiograph  FINDINGS: Left side down radiograph shows the pleural effusions to layer dependently. No appreciable pneumothorax. Cardiac enlargement. Central vascular congestion. Perihilar consolidation on the right appear  IMPRESSION: Layering pleural  effusions with left side down.  Right perihilar vascular congestion and consolidation; pneumonia or atelectasis. Recommend follow up to document resolution.   Electronically Signed   By: Carlos Levering M.D.   On: 03/05/2013 22:49    CBC  Recent Labs Lab 03/05/13 1916 03/05/13 2350 03/06/13 0532 03/07/13 0405  WBC 5.2  --  5.5 5.1  HGB 11.3* 11.9* 10.8* 10.6*  HCT 37.5* 35.0* 31.8* 31.8*  PLT  --   --  127* 104*  MCV 95.0  --  88.8 88.8  MCH 28.6  --  30.2 29.6  MCHC 30.1*  --  34.0 33.3  RDW  --   --  16.4* 16.8*  LYMPHSABS  --   --  1.5 1.3  MONOABS  --   --  0.4 0.7  EOSABS  --   --  1.3* 1.4*  BASOSABS  --   --  0.1 0.1    Chemistries   Recent Labs Lab 03/05/13 2350 03/06/13 0532 03/07/13 0405  NA 137 140 139  K 4.8 4.8 4.5  CL 107 105 104  CO2  --  23 24  GLUCOSE 128* 116* 110*  BUN 37* 38* 38*  CREATININE 1.30* 1.35* 1.44*  CALCIUM  --  8.4 8.4  MG  --   --  2.9*  AST  --  20  --   ALT  --  13  --   ALKPHOS  --  87  --   BILITOT  --  0.4  --    ------------------------------------------------------------------------------------------------------------------ estimated creatinine clearance is 29.7 ml/min (by C-G formula based on Cr of 1.44). ------------------------------------------------------------------------------------------------------------------  Recent Labs  03/06/13 0102  HGBA1C 6.8*   ------------------------------------------------------------------------------------------------------------------ No results found for this basename: CHOL, HDL, LDLCALC, TRIG, CHOLHDL, LDLDIRECT,  in the last 72 hours ------------------------------------------------------------------------------------------------------------------ No results found for this basename: TSH, T4TOTAL, FREET3, T3FREE, THYROIDAB,  in the last 72 hours ------------------------------------------------------------------------------------------------------------------ No results found  for this basename: VITAMINB12, FOLATE, FERRITIN, TIBC, IRON, RETICCTPCT,  in the last 72 hours  Coagulation profile No results found for this basename: INR, PROTIME,  in the last 168 hours   Recent Labs  03/05/13 2240  DDIMER 1.89*    Cardiac Enzymes  Recent Labs Lab 03/06/13 0102 03/06/13 0805 03/06/13 1245  TROPONINI <0.30 <0.30 <0.30   ------------------------------------------------------------------------------------------------------------------ No components found with this basename: POCBNP,      Time Spent in minutes   35   Prentiss Hammett K M.D on 03/07/2013 at 10:21 AM  Between 7am to 7pm - Pager - [phone]  After 7pm go to [email or web address] - password [email or web address]  And look for the night coverage person covering for me after hours  [facility] Office  [phone]

## 2013-03-07 NOTE — Consult Note (Signed)
CARDIOLOGY CONSULT NOTE       Patient ID: [name] [name] MRN: [id number] DOB/AGE: 1950/12/21 63 y.o.  Admit date: 03/05/2013 Referring Physician:   Candiss Norse Primary Physician: No PCP Per Patient Primary Cardiologist:   Liam Rogers Reason for Consultation: Recurrent CHF  Active Problems:   Pleural effusion, bilateral   CHF exacerbation   HPI:   History limited by language barrier.  63 yo [name] with poorly controlled DM and HTN  Seen by Dr Acie Fredrickson in July  Had progressive dyspnea over the last few months rx for pneumonia  BNP elevated echo with EF 50-55% mild AR and mild to moderate MR.  No other diagnositic studies and medical Rx recommended Readmitted with increasing dyspnea and CXR with ? Chronic bilateral loculated effusions.  Echo now with EF 30-35%  Moderate AR and estimated PA systolic pressure of 63.  Has mild CRF with Cr about 1.4  No chest pain palpitations or syncope.  No fever, mild cough no sputum.  Mild LE edema.  Last hospitalization no ACE sighted as elevated Cr for reason.  GFR 38  ROS All other systems reviewed and negative except as noted above  Past Medical History  Diagnosis Date  . Diabetes mellitus     Reportedly diagnosed 2011 but no medication initiated until 04/2011 (Metformin)  . Hypertension   . Pneumonia   . Diabetic peripheral neuropathy     Since 2013  . CHF (congestive heart failure) 08/2012  . Anemia 08/2012  . UTI (urinary tract infection) 08/22/2012    Family History  Problem Relation Age of Onset  . Diabetes Brother     deceased in 02V dt DM complications    History   Social History  . Marital Status: Married    Spouse Name: N/A    Number of Children: N/A  . Years of Education: N/A   Occupational History  . Not on file.   Social History Main Topics  . Smoking status: Never Smoker   . Smokeless tobacco: Never Used  . Alcohol Use: No  . Drug Use: No  . Sexual Activity: Not on file   Other Topics Concern  . Not on file    Social History Narrative   Patient lives with husband and son. Currently without insurance and therefore tries not seek medical care dt financial concern.     Past Surgical History  Procedure Laterality Date  . Cesarean section       . influenza vac split quadrivalent PF  0.5 mL Intramuscular Tomorrow-1000  . insulin aspart  0-9 Units Subcutaneous TID WC  . metoprolol tartrate  50 mg Oral BID  . sodium chloride  3 mL Intravenous Q12H  . sodium chloride  3 mL Intravenous Q12H      Physical Exam: Blood pressure 145/76, pulse 55, temperature 97.9 F (36.6 C), temperature source Oral, resp. rate 18, height 4' 11.06" (1.5 m), weight 116 lb 6.5 oz (52.8 kg), SpO2 95.00%.   Affect appropriate Frail camb[place]ian female  HEENT: normal Neck supple with no adenopathy JVP normal no bruits no thyromegaly Lungs decreased basilar breath sounds bilaterally no wheezing and good diaphragmatic motion Heart:  S1/S2 AR  murmur, no rub, gallop or click PMI normal Abdomen: benighn, BS positve, no tenderness, no AAA no bruit.  No HSM or HJR Distal pulses intact with no bruits No edema Neuro non-focal Skin warm and dry No muscular weakness   Labs:   Lab Results  Component Value Date   WBC 5.1  03/07/2013   HGB 10.6* 03/07/2013   HCT 31.8* 03/07/2013   MCV 88.8 03/07/2013   PLT 104* 03/07/2013    Recent Labs Lab 03/06/13 0532 03/07/13 0405  NA 140 139  K 4.8 4.5  CL 105 104  CO2 23 24  BUN 38* 38*  CREATININE 1.35* 1.44*  CALCIUM 8.4 8.4  PROT 7.0  --   BILITOT 0.4  --   ALKPHOS 87  --   ALT 13  --   AST 20  --   GLUCOSE 116* 110*      Radiology: Dg Chest 2 View  03/05/2013   CLINICAL DATA:  Bilateral pleural effusions and cardiomegaly. Cough without CHF.  EXAM: CHEST  2 VIEW  COMPARISON:  01/19/2013.  FINDINGS: Little change in the chest radiograph compared to prior. The cardiopericardial silhouette is enlarged and partially obscured by would appear to be bilateral subpulmonic  pleural effusions. There is blunting of both costophrenic angles. The effusions may be loculated anteriorly. The pulmonary vascularity is mildly increased. Aortic arch atherosclerosis.  IMPRESSION: Cardiomegaly. Likely anteriorly loculated bilateral chronic pleural effusions, little changed from 01/19/2013.   Electronically Signed   By: Geoffrey  Lamke M.D.   On: 03/05/2013 20:14   Dg Chest Right Decubitus  03/05/2013   CLINICAL DATA:  Evaluate effusions.  EXAM: CHEST - RIGHT DECUBITUS  COMPARISON:  Same day left-sided down radiograph and PA and lateral radiographs of the chest.  FINDINGS: With the right side down, the right pleural effusion layers dependently. The majority of the left pleural effusion layers however there is a portion that persists along the periphery of the left chest wall.  IMPRESSION: Layering right pleural effusion.  Layering and partially loculated left pleural effusion along the left chest wall.   Electronically Signed   By: Andrew  DelGaizo M.D.   On: 03/05/2013 22:51   Dg Chest Left Decubitus  03/05/2013   CLINICAL DATA:  Evaluate effusions  EXAM: CHEST - LEFT DECUBITUS  COMPARISON:  03/05/2013 chest radiograph  FINDINGS: Left side down radiograph shows the pleural effusions to layer dependently. No appreciable pneumothorax. Cardiac enlargement. Central vascular congestion. Perihilar consolidation on the right appear  IMPRESSION: Layering pleural effusions with left side down.  Right perihilar vascular congestion and consolidation; pneumonia or atelectasis. Recommend follow up to document resolution.   Electronically Signed   By: Andrew  DelGaizo M.D.   On: 03/05/2013 22:49    EKG:  SR rate 72 poor R wave progression cannot r/o anterior MI no acute changes    ASSESSMENT AND PLAN:  1) CHF:  BNP 21,512  This does not appear to be just diatolic CHF.  Since July EF has gone done and degree of AR and TR has gone up with evidence of moderate pulmonary hypertension.  Favor right and  left cath with aortic root injection.  Patient seems to understand but may need to confirm consent with daughter/son when available.  Hold lasix and repeat Cr in am   2) DM:  Poorly controlled needs more education hold metformin for cath 3) HTN:  With decreased EF  With GFR close to 40 would think ACE is ok  Primary service can confer with nephrology but would consider starting 48 hours after cath if no contrast nephropathy  Signed: Kalan Yeley 03/07/2013, 9:53 AM    

## 2013-03-07 NOTE — Progress Notes (Signed)
[place] PCCM   Name: [name] [name] MRN: [id number] DOB: December 09, 1950    ADMISSION DATE:  03/05/2013 CONSULTATION DATE:  1/28  REFERRING MD :  [facility] PRIMARY SERVICE:  Family [facility]  CHIEF COMPLAINT:  Cough and peripheral edema.  BRIEF PATIENT DESCRIPTION: 63 year old [name] with PMH of CHF, CKD, and DM. She was admitted to [facility] on 1/27 after developing cough and peripheral edema. On CXR she was found to have bilateral pleural effusions. PCCM has been asked to see.   SIGNIFICANT EVENTS / STUDIES:  08/22/12  2D Echo - EF 50-55%, diffuse hypokinesis of LV. [place], MR.  03/05/13  Admitted to [facility]   03/06/13  2D Echo >>>  LINES / TUBES: PIV  CULTURES: None  ANTIBIOTICS: None  SUBJECTIVE:  Denies SOB, chest pain.    VITAL SIGNS: Temp:  [97.7 F (36.5 C)-97.9 F (36.6 C)] 97.9 F (36.6 C) (01/29 0440) Pulse Rate:  [55-69] 55 (01/29 0440) Resp:  [16-18] 18 (01/29 0440) BP: (139-159)/(62-89) 145/76 mmHg (01/29 0440) SpO2:  [95 %-98 %] 95 % (01/29 0440) Weight:  [116 lb 6.5 oz (52.8 kg)] 116 lb 6.5 oz (52.8 kg) (01/29 0439) On RA  PHYSICAL EXAMINATION: General:  63 year old female NAD Neuro:  Awake Alert Oriented, limited english   HEENT:  NCAT Neck:  Supple, no JVD noted Cardiovascular:  RRR, no m/r/g Lungs: resps even non labored on room air, few bibasilar crackles Abdomen:  Soft, non-tender, non-distended. Musculoskeletal:  Warm and dry, scant BLE edema     Recent Labs Lab 03/05/13 2350 03/06/13 0532 03/07/13 0405  NA 137 140 139  K 4.8 4.8 4.5  CL 107 105 104  CO2  --  23 24  BUN 37* 38* 38*  CREATININE 1.30* 1.35* 1.44*  GLUCOSE 128* 116* 110*    Recent Labs Lab 03/05/13 1916 03/05/13 2350 03/06/13 0532 03/07/13 0405  HGB 11.3* 11.9* 10.8* 10.6*  HCT 37.5* 35.0* 31.8* 31.8*  WBC 5.2  --  5.5 5.1  PLT  --   --  127* 104*   Dg Chest 2 View  03/05/2013   CLINICAL DATA:  Bilateral pleural effusions and cardiomegaly. Cough without CHF.  EXAM: CHEST  2  VIEW  COMPARISON:  01/19/2013.  FINDINGS: Little change in the chest radiograph compared to prior. The cardiopericardial silhouette is enlarged and partially obscured by would appear to be bilateral subpulmonic pleural effusions. There is blunting of both costophrenic angles. The effusions may be loculated anteriorly. The pulmonary vascularity is mildly increased. Aortic arch atherosclerosis.  IMPRESSION: Cardiomegaly. Likely anteriorly loculated bilateral chronic pleural effusions, little changed from 01/19/2013.   Electronically Signed   By: Dereck Ligas M.D.   On: 03/05/2013 20:14   Dg Chest Right Decubitus  03/05/2013   CLINICAL DATA:  Evaluate effusions.  EXAM: CHEST - RIGHT DECUBITUS  COMPARISON:  Same day left-sided down radiograph and PA and lateral radiographs of the chest.  FINDINGS: With the right side down, the right pleural effusion layers dependently. The majority of the left pleural effusion layers however there is a portion that persists along the periphery of the left chest wall.  IMPRESSION: Layering right pleural effusion.  Layering and partially loculated left pleural effusion along the left chest wall.   Electronically Signed   By: Carlos Levering M.D.   On: 03/05/2013 22:51   Dg Chest Left Decubitus  03/05/2013   CLINICAL DATA:  Evaluate effusions  EXAM: CHEST - LEFT DECUBITUS  COMPARISON:  03/05/2013  chest radiograph  FINDINGS: Left side down radiograph shows the pleural effusions to layer dependently. No appreciable pneumothorax. Cardiac enlargement. Central vascular congestion. Perihilar consolidation on the right appear  IMPRESSION: Layering pleural effusions with left side down.  Right perihilar vascular congestion and consolidation; pneumonia or atelectasis. Recommend follow up to document resolution.   Electronically Signed   By: Carlos Levering M.D.   On: 03/05/2013 22:49    ASSESSMENT / PLAN:  Acute on Chronic CHF -- worsening EF on echo with increased AR and TR with  mod pulm HTN ?pulm HTN REC-  -continue diuresis per primary team/ cardiology as renal function allows  -likely cath this admit per cards    Bilateral Pleural Effusions - appear to be free flowing on decub films. These are almost certainly transudates and due to cardiac and volume status REC -  - Supplemental O2 as needed > on RA - Diuresis as above  - f/u CXR in am 1/30 to assess for improvement w diuresis - hold on thoracentesis, look for resolution on CXR - further cardiac w/u and likely R and L cath per cards   [facility], NP 03/07/2013  10:11 AM Pager: [phone] or [phone]  *Care during the described time interval was provided by me and/or other providers on the critical care team. I have reviewed this patient's available data, including medical history, events of note, physical examination and test results as part of my evaluation.  Baltazar Apo, MD, PhD 03/07/2013, 10:53 AM [facility] [phone] or if no answer [phone]

## 2013-03-08 ENCOUNTER — Inpatient Hospital Stay (HOSPITAL_COMMUNITY): Payer: Medicaid Other

## 2013-03-08 ENCOUNTER — Encounter (HOSPITAL_COMMUNITY)
Admission: EM | Disposition: A | Discharge status: Home / Self Care | Payer: Self-pay | Source: Home / Self Care | Attending: Family Medicine

## 2013-03-08 DIAGNOSIS — I359 Nonrheumatic aortic valve disorder, unspecified: Secondary | ICD-10-CM

## 2013-03-08 DIAGNOSIS — I2510 Atherosclerotic heart disease of native coronary artery without angina pectoris: Secondary | ICD-10-CM

## 2013-03-08 HISTORY — PX: LEFT AND RIGHT HEART CATHETERIZATION WITH CORONARY ANGIOGRAM: SHX5449

## 2013-03-08 LAB — GLUCOSE, CAPILLARY
GLUCOSE-CAPILLARY: 93 mg/dL (ref 70–99)
GLUCOSE-CAPILLARY: 128 mg/dL — AB (ref 70–99)
Glucose-Capillary: 85 mg/dL (ref 70–99)
Glucose-Capillary: 110 mg/dL — ABNORMAL HIGH (ref 70–99)
Glucose-Capillary: 136 mg/dL — ABNORMAL HIGH (ref 70–99)

## 2013-03-08 LAB — POCT I-STAT 3, ART BLOOD GAS (G3+)
Acid-base deficit: 2 mmol/L (ref 0.0–2.0)
Bicarbonate: 22.5 mEq/L (ref 20.0–24.0)
O2 Saturation: 89 %
PCO2 ART: 37.5 mmHg (ref 35.0–45.0)
PO2 ART: 57 mmHg — AB (ref 80.0–100.0)
TCO2: 24 mmol/L (ref 0–100)
pH, Arterial: 7.387 (ref 7.350–7.450)

## 2013-03-08 LAB — CREATININE, SERUM
Creatinine, Ser: 1.57 mg/dL — ABNORMAL HIGH (ref 0.50–1.10)
GFR calc Af Amer: 39 mL/min — ABNORMAL LOW (ref >90)
GFR, EST NON AFRICAN AMERICAN: 34 mL/min — AB (ref >90)

## 2013-03-08 LAB — COMPREHENSIVE METABOLIC PANEL
ALT: 13 U/L (ref 0–35)
AST: 23 U/L (ref 0–37)
Albumin: 2.7 g/dL — ABNORMAL LOW (ref 3.5–5.2)
Alkaline Phosphatase: 82 U/L (ref 39–117)
BUN: 39 mg/dL — ABNORMAL HIGH (ref 6–23)
CALCIUM: 8.5 mg/dL (ref 8.4–10.5)
CO2: 22 meq/L (ref 19–32)
CREATININE: 1.5 mg/dL — AB (ref 0.50–1.10)
Chloride: 103 mEq/L (ref 96–112)
GFR calc Af Amer: 42 mL/min — ABNORMAL LOW (ref >90)
GFR, EST NON AFRICAN AMERICAN: 36 mL/min — AB (ref >90)
Glucose, Bld: 112 mg/dL — ABNORMAL HIGH (ref 70–99)
Potassium: 5.1 mEq/L (ref 3.7–5.3)
SODIUM: 141 meq/L (ref 137–147)
TOTAL PROTEIN: 6.9 g/dL (ref 6.0–8.3)
Total Bilirubin: 0.5 mg/dL (ref 0.3–1.2)

## 2013-03-08 LAB — POCT I-STAT 3, VENOUS BLOOD GAS (G3P V)
Acid-base deficit: 1 mmol/L (ref 0.0–2.0)
Bicarbonate: 25 mEq/L — ABNORMAL HIGH (ref 20.0–24.0)
O2 Saturation: 64 %
PO2 VEN: 34 mmHg (ref 30.0–45.0)
TCO2: 26 mmol/L (ref 0–100)
pCO2, Ven: 43.6 mmHg — ABNORMAL LOW (ref 45.0–50.0)
pH, Ven: 7.367 — ABNORMAL HIGH (ref 7.250–7.300)

## 2013-03-08 LAB — CBC WITH DIFFERENTIAL/PLATELET
BASOS ABS: 0.1 10*3/uL (ref 0.0–0.1)
Basophils Relative: 1 % (ref 0–1)
EOS ABS: 0 10*3/uL (ref 0.0–0.7)
EOS PCT: 1 % (ref 0–5)
HCT: 32.2 % — ABNORMAL LOW (ref 36.0–46.0)
Hemoglobin: 10.8 g/dL — ABNORMAL LOW (ref 12.0–15.0)
Lymphocytes Relative: 31 % (ref 12–46)
Lymphs Abs: 1.1 10*3/uL (ref 0.7–4.0)
MCH: 30 pg (ref 26.0–34.0)
MCHC: 33.5 g/dL (ref 30.0–36.0)
MCV: 89.4 fL (ref 78.0–100.0)
MONO ABS: 0.3 10*3/uL (ref 0.1–1.0)
Monocytes Relative: 8 % (ref 3–12)
Neutro Abs: 2.2 10*3/uL (ref 1.7–7.7)
Neutrophils Relative %: 60 % (ref 43–77)
Platelets: 117 10*3/uL — ABNORMAL LOW (ref 150–400)
RBC: 3.6 MIL/uL — ABNORMAL LOW (ref 3.87–5.11)
RDW: 17.1 % — AB (ref 11.5–15.5)
WBC: 3.7 10*3/uL — ABNORMAL LOW (ref 4.0–10.5)

## 2013-03-08 LAB — CBC
HEMATOCRIT: 34.6 % — AB (ref 36.0–46.0)
HEMOGLOBIN: 11.6 g/dL — AB (ref 12.0–15.0)
MCH: 30.1 pg (ref 26.0–34.0)
MCHC: 33.5 g/dL (ref 30.0–36.0)
MCV: 89.6 fL (ref 78.0–100.0)
Platelets: 125 10*3/uL — ABNORMAL LOW (ref 150–400)
RBC: 3.86 MIL/uL — ABNORMAL LOW (ref 3.87–5.11)
RDW: 17.1 % — ABNORMAL HIGH (ref 11.5–15.5)
WBC: 4.4 10*3/uL (ref 4.0–10.5)

## 2013-03-08 LAB — PROTIME-INR
INR: 1.12 (ref 0.00–1.49)
PROTHROMBIN TIME: 14.2 s (ref 11.6–15.2)

## 2013-03-08 SURGERY — LEFT AND RIGHT HEART CATHETERIZATION WITH CORONARY ANGIOGRAM
Anesthesia: LOCAL

## 2013-03-08 MED ORDER — FENTANYL CITRATE 0.05 MG/ML IJ SOLN
INTRAMUSCULAR | Status: AC
  Filled 2013-03-08: qty 2

## 2013-03-08 MED ORDER — ONDANSETRON HCL 4 MG/2ML IJ SOLN
INTRAMUSCULAR | Status: AC
  Filled 2013-03-08: qty 2

## 2013-03-08 MED ORDER — HYDRALAZINE HCL 20 MG/ML IJ SOLN
20.0000 mg | Freq: Four times a day (QID) | INTRAMUSCULAR | Status: DC | PRN
  Administered 2013-03-15: 20 mg via INTRAVENOUS
  Filled 2013-03-08: qty 1

## 2013-03-08 MED ORDER — LIDOCAINE HCL (PF) 1 % IJ SOLN
INTRAMUSCULAR | Status: AC
  Filled 2013-03-08: qty 30

## 2013-03-08 MED ORDER — MIDAZOLAM HCL 2 MG/2ML IJ SOLN
INTRAMUSCULAR | Status: AC
  Filled 2013-03-08: qty 2

## 2013-03-08 MED ORDER — HEPARIN (PORCINE) IN NACL 2-0.9 UNIT/ML-% IJ SOLN
INTRAMUSCULAR | Status: AC
  Filled 2013-03-08: qty 1000

## 2013-03-08 MED ORDER — HYDRALAZINE HCL 20 MG/ML IJ SOLN
INTRAMUSCULAR | Status: AC
Start: 2013-03-08 — End: 2013-03-08
  Filled 2013-03-08: qty 1

## 2013-03-08 MED ORDER — SODIUM CHLORIDE 0.9 % IV SOLN: INTRAVENOUS | Status: AC

## 2013-03-08 MED ORDER — NITROGLYCERIN 0.2 MG/ML ON CALL CATH LAB
INTRAVENOUS | Status: AC
  Filled 2013-03-08: qty 1

## 2013-03-08 MED ORDER — LISINOPRIL 5 MG PO TABS
5.0000 mg | ORAL_TABLET | Freq: Every day | ORAL | Status: DC
  Filled 2013-03-08: qty 1

## 2013-03-08 MED ORDER — HEPARIN SODIUM (PORCINE) 5000 UNIT/ML IJ SOLN
5000.0000 [IU] | Freq: Three times a day (TID) | INTRAMUSCULAR | Status: DC
  Administered 2013-03-09 – 2013-03-19 (×30): 5000 [IU] via SUBCUTANEOUS
  Filled 2013-03-08 (×34): qty 1

## 2013-03-08 NOTE — Progress Notes (Signed)
[place] PCCM   Name: [name] [name] MRN: [id number] DOB: 1950/03/21    ADMISSION DATE:  03/05/2013 CONSULTATION DATE:  1/28  REFERRING MD :  Family [facility] PRIMARY SERVICE:  Family [facility]  CHIEF COMPLAINT:  Cough and peripheral edema.  BRIEF PATIENT DESCRIPTION: 63 year old [name] with PMH of CHF, CKD, and DM. She was admitted to [facility] on 1/27 after developing cough and peripheral edema. On CXR she was found to have bilateral pleural effusions. PCCM has been asked to see.   SIGNIFICANT EVENTS / STUDIES:  08/22/12  2D Echo - EF 50-55%, diffuse hypokinesis of LV. [place], MR.  03/05/13  Admitted to [facility]   03/06/13  2D Echo > EF 30-35%, diffuse hypokinesis, PA peak pressure 63. AR, MR  LINES / TUBES: PIV   CULTURES: None  ANTIBIOTICS: None  SUBJECTIVE:  Questioned with interpreted present, SOB is improving no chest pain  VITAL SIGNS: Temp:  [97.4 F (36.3 C)-98.7 F (37.1 C)] 98.7 F (37.1 C) (01/30 0927) Pulse Rate:  [60-70] 70 (01/30 0927) Resp:  [16-18] 16 (01/30 0927) BP: (142-184)/(72-82) 172/78 mmHg (01/30 0940) SpO2:  [95 %-100 %] 99 % (01/30 0927) Weight:  [51.7 kg (113 lb 15.7 oz)] 51.7 kg (113 lb 15.7 oz) (01/30 0214) On RA  PHYSICAL EXAMINATION: General:  63 year old female NAD Neuro:  Awake Alert Oriented, limited english   HEENT:  NCAT Neck:  Supple, no JVD noted Cardiovascular:  RRR, no m/r/g Lungs: resps even non labored on room air, few bibasilar crackles Abdomen:  Soft, non-tender, non-distended. Musculoskeletal:  Warm and dry, scant BLE edema     Recent Labs Lab 03/06/13 0532 03/07/13 0405 03/08/13 0339  NA 140 139 141  K 4.8 4.5 5.1  CL 105 104 103  CO2 23 24 22   BUN 38* 38* 39*  CREATININE 1.35* 1.44* 1.50*  GLUCOSE 116* 110* 112*    Recent Labs Lab 03/06/13 0532 03/07/13 0405 03/08/13 0339  HGB 10.8* 10.6* 10.8*  HCT 31.8* 31.8* 32.2*  WBC 5.5 5.1 3.7*  PLT 127* 104* 117*   Dg Chest 2 View  03/08/2013   CLINICAL DATA:  Pleural  effusions.  EXAM: CHEST  2 VIEW  COMPARISON:  03/05/2013  FINDINGS: Cardiomegaly and slight pulmonary vascular congestion persist. Bilateral pleural effusions persist, slightly diminished on the left. The fusions are loculated anteriorly and along the fissures. On decubitus views some of the fluid does layer laterally.  IMPRESSION: Slight decrease in the left pleural effusion. Persistent cardiomegaly and pulmonary vascular congestion.   Electronically Signed   By: Rozetta Nunnery M.D.   On: 03/08/2013 07:24   Dg Abd Portable 1v  03/07/2013   CLINICAL DATA:  Right-sided abdominal pain and emesis.  EXAM: PORTABLE ABDOMEN - 1 VIEW  COMPARISON:  None available for comparison at time of study interpretation.  FINDINGS: Bowel gas pattern is nondilated and nonobstructive. No intra-abdominal mass effect; limited assessment for free air on this supine view. Soft tissue planes and included osseous structures are nonsuspicious. Mild degenerative change of the thoracic spine. Apparent surgical clips in the left upper quadrant versus calcifications.  Included view of the lung bases demonstrates partially imaged pleural effusions.  IMPRESSION: Surgical clips versus calcifications of the left upper quadrant.  Nonspecific bowel gas pattern.   Electronically Signed   By: Elon Alas   On: 03/07/2013 16:02   US Abdomen Limited Ruq  03/07/2013   CLINICAL DATA:  Abdominal pain, diabetes, hypertension  EXAM: US ABDOMEN LIMITED -  RIGHT UPPER QUADRANT  COMPARISON:  None.  FINDINGS: Gallbladder:  Minimal gallbladder wall thickening measuring 3.4 mm. Negative for gallstones or associated Murphy sign. No pericholecystic fluid.  Common bile duct:  Diameter: 3.3 mm  Liver:  No focal lesion identified. Within normal limits in parenchymal echogenicity. Patent portal vein with normal hepatopetal flow.  Additional Large right pleural effusion noted.  IMPRESSION: Minimal gallbladder wall thickening but no associated cholelithiasis or  definite Murphy sign. Gallbladder appears remains nonspecific.  No biliary dilatation  Large right pleural effusion   Electronically Signed   By: Daryll Brod M.D.   On: 03/07/2013 21:00    ASSESSMENT / PLAN:  Acute on Chronic CHF -- worsening EF on echo with increased AR and TR with mod pulm HTN ?pulm HTN REC-  -continue diuresis per primary team/ cardiology as renal function allows  -R and L heart cath today per cards.    Bilateral Pleural Effusions - appear to be free flowing on decub films. These are almost certainly transudates and due to cardiac and volume status. 1/30 appear improved on CXR REC -  - Supplemental O2 as needed > on RA - Diuresis as above  - Some improvement in films with diuresis. - will hold on thoracentesis - if renal function post cath limits diuresis may require thora.  - PCCM will be available PRN    Corey Harold, NP 03/08/2013  12:21 PM Pager: [phone] or [phone]   Baltazar Apo, MD, PhD 03/08/2013, 1:05 PM [facility] [phone] or if no answer [phone]

## 2013-03-08 NOTE — Interval H&P Note (Signed)
History and Physical Interval Note:  03/08/2013 9:15 AM  [name] [name]  has presented today for surgery, with the diagnosis of New onset Systolic HF with Moderate Aortic Insufficiency.  The various methods of treatment have been discussed with the patient and family. After consideration of risks, benefits and other options for treatment, the patient has consented to  Procedure(s): LEFT AND RIGHT HEART CATHETERIZATION WITH CORONARY ANGIOGRAM (N/A) +/- PCI as a surgical intervention .    The patient's history has been reviewed, patient examined, no change in status, stable for surgery.  I have reviewed the patient's chart and labs.  Questions were answered to the patient's satisfaction.     HARDING,DAVID W

## 2013-03-08 NOTE — CV Procedure (Signed)
CARDIAC CATHETERIZATION REPORT  NAME:  [name] [name]   MRN: [id number] DOB:  [birth year]   ADMIT DATE: 03/05/2013 Procedure Date: 03/08/2013  INTERVENTIONAL CARDIOLOGIST: Leonie Man, M.D., MS PRIMARY CARE PROVIDER: No PCP Per Patient PRIMARY CARDIOLOGIST:  Grayland Jack, MD  PATIENT:  [name] [name] is a 63 y.o. [name]  female, who speaks very little [place]. She is poorly controlled diabetes and hypertension, and has been followed by Dr. Mertie Moores, last seen in July of 2014.  At that time she is not have a low-normal EF of 50-55% with mild AR, mild-moderate MR and possible diastolic dysfunction. She was readmitted on January 27 with the new onset of worsening dyspnea, PND orthopnea. She was found to have bilateral loculated effusions on chest x-ray and symptoms consistent with CHF with elevated proBNP. She had an echocardiogram that revealed an EF now 30 and 35% with global hypokinesis. There is also suggestive of moderate aortic valve regurgitation and elevated pulmonary pressures. She has moderate PR and TR as well as mild MR. With the new onset of systolic heart failure in the setting of patient with significant cardiac risk factors, she is referred for invasive ischemic evaluation would r catheterization. Also asked to perform right heart catheterization based on the elevated pulmonary pressures. I was also asked to perform an aortic root angiogram to evaluate the severity of her aortic regurgitation.  PRE-OPERATIVE DIAGNOSIS:    New Onset Systolic Heart Failure With Previous History of Diastolic Heart Failure  Class 3 heart failure symptoms on presentation  Cardiac Risk Factors of Poorly Controlled Hypertension and Diabetes.  Valvular Regurgitation on echo  Pulmonary Hypertension on Echo  PROCEDURES PERFORMED:    Right and Left Heart Catheterization with Coronary Angiography.  PROCEDURE:Consent:  Risks of procedure as well as the alternatives and risks of each were explained to  the (patient/caregiver).  Consent for procedure obtained. I used a [name] language interpreter, who is present to discuss the entire procedure as well as risks benefits alternatives and indications. The gentleman was also present following the procedure to discuss the results. Consent for signed by MD and patient with RN witness -- placed on chart.   PROCEDURE: The patient was brought to the [facility] in the fasting state and prepped and draped in the usual sterile fashion for Right groin or radial access. Radial access was not attempted due to the patient's body habitus being very small with very very small wrists.  Sterile technique was used including antiseptics, cap, gloves, gown, hand hygiene, mask and sheet.  Skin prep: Chlorhexidine.  Time Out: Verified patient identification, verified procedure, site/side was marked, verified correct patient position, special equipment/implants available, medications/allergies/relevent history reviewed, required imaging and test results available.  Performed  Access:  Right Common Femoral Artery: 5 Fr Sheath - fluoroscopically guided modified Seldinger Technique  Right Common Femoral Vein: 7 Fr Sheath - Seldinger Technique.  Right Heart Catheterization: 7Fr Gordy Councilman catheter advanced under fluoroscopy with balloon inflated to the RA, RV, then PCWP-PA for hemodynamic measurement. Intermittent measurements were made during the Left Heart Catheterization following administration of intracoronary as well as intra-aortic nitroglycerin and IV hydralazine. Simultaneous FA & PA blood gases checked for SaO2% to calculate FICK CO/CI  Thermodilution Injections performed to calculate CO/CI  Simultaneous PCWP/LV & RV/LV pressures monitored with Angled Pigtail in LV.  Catheter removed completely out of the body with balloon deflated.  Left Heart Catheterization: 5 Fr Catheters advanced or exchanged over a J-wire;  angled pigtail  catheter advanced first.  LV Hemodynamics (LV Gram), Aortic Root Angiography: Angled pigtail Left Coronary Artery Cineangiography: JL4 Catheter  Right Coronary Artery Cineangiography: JR 4 Catheter   Sheaths removed in the holding area with manual pressure for hemostasis.   Hemodynamic Findings:    SaO2%   initial pressure  mmHg Pressures mmHg  Following blood pressure IV Hydralazine & IA/IC NTG Mean P  mmHg  EDP  mmHg   Right Atrium     15/15    15   Right Ventricle     80/8    30   Pulmonary Artery   54   80/38 ; 46   75/36   45    PCWP     27/36  (large V wave )  30    Central Aortic   89   185/86 ; mean 125 mmHg  143/65   95    Left Ventricle    184/25 ; EDP 31   146/15    23           Cardiac Output:    Cardiac Index:    Fick   3.8     2.59    Thermodilution   2.97     2.2     Left Ventriculography:  EF roughly 30 and 35%; global hypokinesis worse in the inferolateral and basal inferior wall; mild MR  Aortic Root Angiogram:  Mild aortic calcification, most notable in the aortic knob. 3 cusps with 1+ at worst AI  Coronary Anatomy: Overall small caliber, diffusely diseased vessels and  Left Main: Moderate-caliber vessel, bifurcates into the LAD, and Circumflex. Calcified, but angiographically patent LAD: Small to moderate caliber vessel (2.5-2.25 mm greatest diameter) it wraps around the apex with a roughly 40% stenosis at the takeoff of a small D1 and another 40% take off at the takeoff of D2. Both D1 and D2 are very small-caliber, diffusely diseased vessels  There are collaterals from the extensive septal perforators and the distal LAD to do RPDA; all of collaterals only reaches the bifurcation of the RCA, does not go retrograde up the native RCA  Left Circumflex: Smaller moderate caliber vessel that basically trifurcates into OM 1, OM 2 and the AV groove circumflex.  OM1: Small caliber vessel, diffusely diseased but not significantly stenosed  OM 2: Small to moderate  caliber vessel (perhaps 2 mm) that is diffusely diseased, subtotally occluded with ranging from 80-99% stenosis covering a roughly 30-40 mm distribution. Distally the vessel reconstitutes into a moderate caliber vessel for short segment.  AV Groove Circumflex: Small caliber vessel that terminates as several tiny left posterior lateral branches that gives collaterals to the distal RCA/RPL system   RCA: Moderate to-large-caliber vessel, there is minimal luminal irregularities until it is 100% stump occluded at an RV marginal branch. There are bridging collaterals distally but minimal reconstitution.  RPDA: Fills via collaterals from septal perforator and distal LAD, is a very small caliber diffusely but diseased vessel  RPL Sysytem: Multiple tiny branches that are perfused via left to right collaterals from the circumflex system.  MEDICATIONS:  Anesthesia:  Local Lidocaine 18 ml  Sedation:   1 mg IV Versed, 25 mcg IV fentanyl ;   Omnipaque Contrast: 95 ml  Nitroglycerin: IA 200 mcg; IC 200 mcg  IV Hydralazine: 20 mg  PATIENT DISPOSITION:    The patient was transferred to the PACU holding area in a hemodynamicaly stable, chest pain free condition.  The patient tolerated the  procedure well, and there were no complications.  EBL:   < 10 ml  The patient was stable before, during, and after the procedure.  POST-OPERATIVE DIAGNOSIS:    Severe 2 Vessel CAD - 100% RCA mid with L-R collaterals; Diffuse 80-95% subtotal occlusion of OM2 (~1.5-2.0 mm vessel - ? PCI target)  -- I reviewed these images with Dr. Daneen Schick, who agrees with my decision that the OM2 is simply to diffusely diseased and small caliber to attempt PCI, with likely minimal benefit based on the global hypokinesis. This vessel does not provide collaterals to the RCA.  Overall relatively small caliber, diffusely diseased coronary arteries consistent with poorly controlled diabetes.  Severe PA HTN 75-80/30 mmHg (Mean 45) mm  Hg; PCWP ~ 30 mmHg Systemic HTN - improved after IA/IC NTG plus IV Hydralazine, without notable improvement of LVEDP (~25-30 mmHg) & PCWP (~30).  PLAN OF CARE:  Returned to nursing unit for standard post catheterization care.  She will need continued aggressive diuresis and afterload reduction. I recommended consult the Advanced Heart Failure Service to arrange outpatient followup based on her significant heart failure as well as pulmonary hypertension. It does appear to be some complement of pulmonary involvement based on the lack of significant reduction in pulmonary pressures despite reduction in systolic pressures.  Would welcome input from other interventional colleagues as to the feasibility helpfulness of attending PCI on OM 2, otherwise would recommend medical therapy for coronary disease. With no severe lesions in the LAD, I would not recommend CABG, unless there is severe valvular dysfunction.   Leonie Man, M.D., M.S. [facility] [address]. [place], [place]  [address]  [phone]  03/08/2013 11:33 AM

## 2013-03-08 NOTE — Care Management Note (Unsigned)
    Page 1 of 1   03/14/2013     4:11:05 PM   CARE MANAGEMENT NOTE 03/14/2013  Patient:  [name] [name], [name] [name]   Account Number:  [id number]  Date Initiated:  03/08/2013  Documentation initiated by:  Stephie Xu  Subjective/Objective Assessment:   PT ADM ON 1/27 WITH CHF, PLEURAL EFFUSIONS.  PTA, PT LIVES AT HOME WITH FAMILY.  SHE IS [place]; SPEAKS LITTLE ENGLISH.     Action/Plan:   PT HAS NO PCP;  APPT MADE AT [place] FOR SAT 2/7 AT 10:00.  WILL CONT TO FOLLOW FOR DC NEEDS AS PT PROGRESSES.  MAY BE ELIGIBLE FOR MED ASSIST THROUGH [facility] MATCH PROGRAM.   Anticipated DC Date:     Anticipated DC Plan:  [place]  CM consult      Choice offered to / List presented to:             Status of service:  In process, will continue to follow Medicare Important Message given?   (If response is "NO", the following Medicare IM given date fields will be blank) Date Medicare IM given:   Date Additional Medicare IM given:    Discharge Disposition:    Per UR Regulation:  Reviewed for med. necessity/level of care/duration of stay  If discussed at [place] of Stay Meetings, dates discussed:   03/12/2013  03/14/2013    Comments:  03/14/13 Bettylou Frew,RN,BSN [phone] PT TRANSFERRED TO SDU;  ON IV MILRINONE DRIP.

## 2013-03-08 NOTE — Clinical Documentation Improvement (Signed)
Possible Clinical Conditions?   Accelerated Hypertension Malignant Hypertension Or Other Condition Cannot Clinically Determine    Risk Factors: Patient with uncontrolled blood pressure, per 01/28 progress notes.  SBP range: 159-184 DBP range: 70-89  Thank You, Theron Arista, Clinical Documentation Specialist:  [facility]

## 2013-03-08 NOTE — H&P (View-Only) (Signed)
CARDIOLOGY CONSULT NOTE       Patient ID: [name] [name] MRN: [id number] DOB/AGE: 1950/12/21 63 y.o.  Admit date: 03/05/2013 Referring Physician:   Candiss Norse Primary Physician: No PCP Per Patient Primary Cardiologist:   Liam Rogers Reason for Consultation: Recurrent CHF  Active Problems:   Pleural effusion, bilateral   CHF exacerbation   HPI:   History limited by language barrier.  63 yo [name] with poorly controlled DM and HTN  Seen by Dr Acie Fredrickson in July  Had progressive dyspnea over the last few months rx for pneumonia  BNP elevated echo with EF 50-55% mild AR and mild to moderate MR.  No other diagnositic studies and medical Rx recommended Readmitted with increasing dyspnea and CXR with ? Chronic bilateral loculated effusions.  Echo now with EF 30-35%  Moderate AR and estimated PA systolic pressure of 63.  Has mild CRF with Cr about 1.4  No chest pain palpitations or syncope.  No fever, mild cough no sputum.  Mild LE edema.  Last hospitalization no ACE sighted as elevated Cr for reason.  GFR 38  ROS All other systems reviewed and negative except as noted above  Past Medical History  Diagnosis Date  . Diabetes mellitus     Reportedly diagnosed 2011 but no medication initiated until 04/2011 (Metformin)  . Hypertension   . Pneumonia   . Diabetic peripheral neuropathy     Since 2013  . CHF (congestive heart failure) 08/2012  . Anemia 08/2012  . UTI (urinary tract infection) 08/22/2012    Family History  Problem Relation Age of Onset  . Diabetes Brother     deceased in 02V dt DM complications    History   Social History  . Marital Status: Married    Spouse Name: N/A    Number of Children: N/A  . Years of Education: N/A   Occupational History  . Not on file.   Social History Main Topics  . Smoking status: Never Smoker   . Smokeless tobacco: Never Used  . Alcohol Use: No  . Drug Use: No  . Sexual Activity: Not on file   Other Topics Concern  . Not on file    Social History Narrative   Patient lives with husband and son. Currently without insurance and therefore tries not seek medical care dt financial concern.     Past Surgical History  Procedure Laterality Date  . Cesarean section       . influenza vac split quadrivalent PF  0.5 mL Intramuscular Tomorrow-1000  . insulin aspart  0-9 Units Subcutaneous TID WC  . metoprolol tartrate  50 mg Oral BID  . sodium chloride  3 mL Intravenous Q12H  . sodium chloride  3 mL Intravenous Q12H      Physical Exam: Blood pressure 145/76, pulse 55, temperature 97.9 F (36.6 C), temperature source Oral, resp. rate 18, height 4' 11.06" (1.5 m), weight 116 lb 6.5 oz (52.8 kg), SpO2 95.00%.   Affect appropriate Frail [place] female  HEENT: normal Neck supple with no adenopathy JVP normal no bruits no thyromegaly Lungs decreased basilar breath sounds bilaterally no wheezing and good diaphragmatic motion Heart:  S1/S2 AR  murmur, no rub, gallop or click PMI normal Abdomen: benighn, BS positve, no tenderness, no AAA no bruit.  No HSM or HJR Distal pulses intact with no bruits No edema Neuro non-focal Skin warm and dry No muscular weakness   Labs:   Lab Results  Component Value Date   WBC 5.1  03/07/2013   HGB 10.6* 03/07/2013   HCT 31.8* 03/07/2013   MCV 88.8 03/07/2013   PLT 104* 03/07/2013    Recent Labs Lab 03/06/13 0532 03/07/13 0405  NA 140 139  K 4.8 4.5  CL 105 104  CO2 23 24  BUN 38* 38*  CREATININE 1.35* 1.44*  CALCIUM 8.4 8.4  PROT 7.0  --   BILITOT 0.4  --   ALKPHOS 87  --   ALT 13  --   AST 20  --   GLUCOSE 116* 110*      Radiology: Dg Chest 2 View  03/05/2013   CLINICAL DATA:  Bilateral pleural effusions and cardiomegaly. Cough without CHF.  EXAM: CHEST  2 VIEW  COMPARISON:  01/19/2013.  FINDINGS: Little change in the chest radiograph compared to prior. The cardiopericardial silhouette is enlarged and partially obscured by would appear to be bilateral subpulmonic  pleural effusions. There is blunting of both costophrenic angles. The effusions may be loculated anteriorly. The pulmonary vascularity is mildly increased. Aortic arch atherosclerosis.  IMPRESSION: Cardiomegaly. Likely anteriorly loculated bilateral chronic pleural effusions, little changed from 01/19/2013.   Electronically Signed   By: Dereck Ligas M.D.   On: 03/05/2013 20:14   Dg Chest Right Decubitus  03/05/2013   CLINICAL DATA:  Evaluate effusions.  EXAM: CHEST - RIGHT DECUBITUS  COMPARISON:  Same day left-sided down radiograph and PA and lateral radiographs of the chest.  FINDINGS: With the right side down, the right pleural effusion layers dependently. The majority of the left pleural effusion layers however there is a portion that persists along the periphery of the left chest wall.  IMPRESSION: Layering right pleural effusion.  Layering and partially loculated left pleural effusion along the left chest wall.   Electronically Signed   By: Carlos Levering M.D.   On: 03/05/2013 22:51   Dg Chest Left Decubitus  03/05/2013   CLINICAL DATA:  Evaluate effusions  EXAM: CHEST - LEFT DECUBITUS  COMPARISON:  03/05/2013 chest radiograph  FINDINGS: Left side down radiograph shows the pleural effusions to layer dependently. No appreciable pneumothorax. Cardiac enlargement. Central vascular congestion. Perihilar consolidation on the right appear  IMPRESSION: Layering pleural effusions with left side down.  Right perihilar vascular congestion and consolidation; pneumonia or atelectasis. Recommend follow up to document resolution.   Electronically Signed   By: Carlos Levering M.D.   On: 03/05/2013 22:49    EKG:  SR rate 72 poor R wave progression cannot r/o anterior MI no acute changes    ASSESSMENT AND PLAN:  1) CHF:  BNP 21,512  This does not appear to be just diatolic CHF.  Since July EF has gone done and degree of AR and TR has gone up with evidence of moderate pulmonary hypertension.  Favor right and  left cath with aortic root injection.  Patient seems to understand but may need to confirm consent with daughter/son when available.  Hold lasix and repeat Cr in am   2) DM:  Poorly controlled needs more education hold metformin for cath 3) HTN:  With decreased EF  With GFR close to 40 would think ACE is ok  Primary service can confer with nephrology but would consider starting 48 hours after cath if no contrast nephropathy  Signed: Jenkins Rouge 03/07/2013, 9:53 AM

## 2013-03-08 NOTE — Brief Op Note (Signed)
03/05/2013 - 03/08/2013  11:36 AM  PATIENT:  [name] [name]  63 y.o. female referred for R&LHC for AcuteCombined Systolic & Diastolic HF with newly reduced EF along with Mod AI.    PRE-OPERATIVE DIAGNOSIS:  Acute Systolic / Diastolic HF; AI  POST-OPERATIVE DIAGNOSIS:    Severe 2 Vessel CAD - 100% RCA mid with L-R collaterals; Diffuse 80-95% subtotal occlusion of OM2 (~1.5-2.0 mm vessel - ? PCI target)  Generalized small vessel caliber   Severe PA HTN 75-80/30 mmHg (Mean 45) mm Hg; PCWP ~ 30 mmHg  Systemic HTN - improved after IA/IC NTG plus IV Hydralazine, without notable improvement of LVEDP (~25-30 mmHg) & PCWP (~30).   PROCEDURE:  Procedure(s): LEFT AND RIGHT HEART CATHETERIZATION WITH CORONARY ANGIOGRAM (N/A)  SURGEON:  Surgeon(s) and Role:    * Leonie Man, MD - Primary  ANESTHESIA:   local and IV sedation; SQ Lidocaine, 1 mg Versed, 25 mcg Fentanyl  EBL:    < 10 ml  LHC: 5 Fr RFA - Pigtail, JL4, JR 4  RHC: 7 Fr RFV Sheath -- 7 Fr Swan; removed at completion of procedure   SHEATHS:  Removed in PACU Holding area.  DICTATION: .Note written in [place]: Return to Nursing Unit.  Continue to titrate medial therapy.  Consider Pulm HTN Rx; Will review films with Interventional colleagues to determine if PCI on OM2 would be feasible.  PATIENT DISPOSITION:  PACU - hemodynamically stable.   Delay start of Pharmacological VTE agent (>24hrs) due to surgical blood loss or risk of bleeding: not applicable  HARDING,DAVID W, M.D., M.S. [facility] [address]. [place], [place]  [address]  [phone] Pager # [phone] 03/08/2013 12:01 PM

## 2013-03-08 NOTE — Progress Notes (Signed)
Patient Demographics  [name] [name], is a 63 y.o. female, DOB - [birth year], [id number]  Admit date - 03/05/2013   Admitting Physician Shanda Howells, MD  Outpatient Primary MD for the patient is No PCP Per Patient  LOS - 3   Chief Complaint  Patient presents with  . Shortness of Breath        Assessment & Plan    1.SOB due to Pl effusions and acute on chronic systolic and diastolic CHF - likely transudative due to underlying CHF, improved diuresis, question of loculated effusion for which pulmonary is already following the patient.     2. Acute on chronic systolic and diastolic CHF exacerbation with severe mitral valve stenosis noted on TTE done this admission. Patient's EF has dropped considerably in the last 1 year, currently compensated from CHF standpoint after initial Lasix which is now held, her shortness of breath is remarkably improved, cardiology is following the patient and considering right and left heart catheterization. Continue beta blocker, will resume ACE inhibitor after catheterization is done and creatinine remain stable.     3. Chronic kidney disease stage III. Baseline creatinine between 1.4 and 1.5. She is currently at her baseline, monitor cautiously in the setting of dye exposure due to catheterization. We'll hold ACE/ARB till creatinine appears stable post procedure.     4. 16 beat run of asymptomatic V. tach on 03/06/2013. Troponins trended are negative, she has been placed on better blocker, electrolytes including magnesium stable. Cardiology following.      5. Mild right upper quadrant abdominal pain. Liver enzymes are stable, normal penis resolved and stable right upper quadrant ultrasound, doubt this is acute cholecystitis, will check a HIDA scan to complete  workup.        6.DM-2 - A1c at 6.8, glycemic control is acceptable and GERD.   CBG (last 3)   Recent Labs  03/07/13 1634 03/07/13 2125 03/08/13 0623  GLUCAP 146* 124* 93     Lab Results  Component Value Date   HGBA1C 6.8* 03/06/2013       Code Status: Full  Family Communication: none present  Disposition Plan: Home   Procedures TTE   Consults  Cards, Renal   Medications  Scheduled Meds: . [facility] HOLD] heparin subcutaneous  5,000 Units Subcutaneous Q8H  . [MAR HOLD] insulin aspart  0-9 Units Subcutaneous TID WC  . [facility] HOLD] metoprolol tartrate  50 mg Oral BID  . [MAR HOLD] sodium chloride  3 mL Intravenous Q12H  . [facility] HOLD] sodium chloride  3 mL Intravenous Q12H  . sodium chloride  3 mL Intravenous Q12H   Continuous Infusions: . sodium chloride 1 mL/kg/hr (03/08/13 0406)   PRN Meds:.[MAR HOLD] sodium chloride, sodium chloride, [MAR HOLD] HYDROcodone-acetaminophen, [MAR HOLD] ondansetron, [MAR HOLD] sodium chloride, sodium chloride  DVT Prophylaxis    Heparin    Lab Results  Component Value Date   PLT 117* 03/08/2013    Antibiotics    Anti-infectives   None          Subjective:   [name] [name] today has, No headache, No chest pain,   No Nausea, No new weakness tingling or numbness, No Cough - SOB. Mild RUQ abd pain.  Objective:   Filed Vitals:  03/08/13 0214 03/08/13 0323 03/08/13 0927 03/08/13 0940  BP:  142/81 184/82 172/78  Pulse:  65 70   Temp:  98.1 F (36.7 C) 98.7 F (37.1 C)   TempSrc:  Oral Oral   Resp:  18 16   Height:      Weight: 51.7 kg (113 lb 15.7 oz)     SpO2:  98% 99%     Wt Readings from Last 3 Encounters:  03/08/13 51.7 kg (113 lb 15.7 oz)  03/08/13 51.7 kg (113 lb 15.7 oz)  03/05/13 55.611 kg (122 lb 9.6 oz)     Intake/Output Summary (Last 24 hours) at 03/08/13 1121 Last data filed at 03/08/13 0214  Gross per 24 hour  Intake      0 ml  Output    200 ml  Net   -200 ml    Exam Awake Alert, Oriented  X 3, No new F.N deficits, Normal affect [place].AT,PERRAL Supple Neck,No JVD, No cervical lymphadenopathy appriciated.  Symmetrical Chest wall movement, Good air movement bilaterally, CTAB RRR,No Gallops,Rubs or new Murmurs, No Parasternal Heave +ve B.Sounds, Abd Soft, mild RUQ tenderness, No organomegaly appriciated, No rebound - guarding or rigidity. No Cyanosis, Clubbing or edema, No new Rash or bruise      Data Review   Micro Results No results found for this or any previous visit (from the past 240 hour(s)).  Radiology Reports Dg Chest 2 View  03/05/2013   CLINICAL DATA:  Bilateral pleural effusions and cardiomegaly. Cough without CHF.  EXAM: CHEST  2 VIEW  COMPARISON:  01/19/2013.  FINDINGS: Little change in the chest radiograph compared to prior. The cardiopericardial silhouette is enlarged and partially obscured by would appear to be bilateral subpulmonic pleural effusions. There is blunting of both costophrenic angles. The effusions may be loculated anteriorly. The pulmonary vascularity is mildly increased. Aortic arch atherosclerosis.  IMPRESSION: Cardiomegaly. Likely anteriorly loculated bilateral chronic pleural effusions, little changed from 01/19/2013.   Electronically Signed   By: Dereck Ligas M.D.   On: 03/05/2013 20:14   Dg Chest Right Decubitus  03/05/2013   CLINICAL DATA:  Evaluate effusions.  EXAM: CHEST - RIGHT DECUBITUS  COMPARISON:  Same day left-sided down radiograph and PA and lateral radiographs of the chest.  FINDINGS: With the right side down, the right pleural effusion layers dependently. The majority of the left pleural effusion layers however there is a portion that persists along the periphery of the left chest wall.  IMPRESSION: Layering right pleural effusion.  Layering and partially loculated left pleural effusion along the left chest wall.   Electronically Signed   By: Carlos Levering M.D.   On: 03/05/2013 22:51   Dg Chest Left Decubitus  03/05/2013   CLINICAL  DATA:  Evaluate effusions  EXAM: CHEST - LEFT DECUBITUS  COMPARISON:  03/05/2013 chest radiograph  FINDINGS: Left side down radiograph shows the pleural effusions to layer dependently. No appreciable pneumothorax. Cardiac enlargement. Central vascular congestion. Perihilar consolidation on the right appear  IMPRESSION: Layering pleural effusions with left side down.  Right perihilar vascular congestion and consolidation; pneumonia or atelectasis. Recommend follow up to document resolution.   Electronically Signed   By: Carlos Levering M.D.   On: 03/05/2013 22:49    CBC  Recent Labs Lab 03/05/13 1916 03/05/13 2350 03/06/13 0532 03/07/13 0405 03/08/13 0339  WBC 5.2  --  5.5 5.1 3.7*  HGB 11.3* 11.9* 10.8* 10.6* 10.8*  HCT 37.5* 35.0* 31.8* 31.8* 32.2*  PLT  --   --  127* 104* 117*  MCV 95.0  --  88.8 88.8 89.4  MCH 28.6  --  30.2 29.6 30.0  MCHC 30.1*  --  34.0 33.3 33.5  RDW  --   --  16.4* 16.8* 17.1*  LYMPHSABS  --   --  1.5 1.3 1.1  MONOABS  --   --  0.4 0.7 0.3  EOSABS  --   --  1.3* 1.4* 0.0  BASOSABS  --   --  0.1 0.1 0.1    Chemistries   Recent Labs Lab 03/05/13 2350 03/06/13 0532 03/07/13 0405 03/08/13 0339  NA 137 140 139 141  K 4.8 4.8 4.5 5.1  CL 107 105 104 103  CO2  --  23 24 22   GLUCOSE 128* 116* 110* 112*  BUN 37* 38* 38* 39*  CREATININE 1.30* 1.35* 1.44* 1.50*  CALCIUM  --  8.4 8.4 8.5  MG  --   --  2.9*  --   AST  --  20  --  23  ALT  --  13  --  13  ALKPHOS  --  87  --  82  BILITOT  --  0.4  --  0.5   ------------------------------------------------------------------------------------------------------------------ estimated creatinine clearance is 26.2 ml/min (by C-G formula based on Cr of 1.5). ------------------------------------------------------------------------------------------------------------------  Recent Labs  03/06/13 0102  HGBA1C 6.8*    ------------------------------------------------------------------------------------------------------------------ No results found for this basename: CHOL, HDL, LDLCALC, TRIG, CHOLHDL, LDLDIRECT,  in the last 72 hours ------------------------------------------------------------------------------------------------------------------ No results found for this basename: TSH, T4TOTAL, FREET3, T3FREE, THYROIDAB,  in the last 72 hours ------------------------------------------------------------------------------------------------------------------ No results found for this basename: VITAMINB12, FOLATE, FERRITIN, TIBC, IRON, RETICCTPCT,  in the last 72 hours  Coagulation profile  Recent Labs Lab 03/08/13 0339  INR 1.12     Recent Labs  03/05/13 2240  DDIMER 1.89*    Cardiac Enzymes  Recent Labs Lab 03/06/13 0102 03/06/13 0805 03/06/13 1245  TROPONINI <0.30 <0.30 <0.30   ------------------------------------------------------------------------------------------------------------------ No components found with this basename: POCBNP,      Time Spent in minutes   35   Helmut Hennon K M.D on 03/08/2013 at 11:21 AM  Between 7am to 7pm - Pager - [phone]  After 7pm go to [email or web address] - password [email or web address]  And look for the night coverage person covering for me after hours  [facility] Office  [phone]

## 2013-03-09 ENCOUNTER — Inpatient Hospital Stay (HOSPITAL_COMMUNITY): Payer: Medicaid Other

## 2013-03-09 ENCOUNTER — Encounter (HOSPITAL_COMMUNITY): Payer: Self-pay | Admitting: *Deleted

## 2013-03-09 DIAGNOSIS — N179 Acute kidney failure, unspecified: Secondary | ICD-10-CM

## 2013-03-09 LAB — CBC WITH DIFFERENTIAL/PLATELET
Basophils Absolute: 0 10*3/uL (ref 0.0–0.1)
Basophils Relative: 1 % (ref 0–1)
EOS ABS: 0.1 10*3/uL (ref 0.0–0.7)
Eosinophils Relative: 1 % (ref 0–5)
HEMATOCRIT: 34.2 % — AB (ref 36.0–46.0)
HEMOGLOBIN: 11.4 g/dL — AB (ref 12.0–15.0)
Lymphocytes Relative: 22 % (ref 12–46)
Lymphs Abs: 0.9 10*3/uL (ref 0.7–4.0)
MCH: 29.9 pg (ref 26.0–34.0)
MCHC: 33.3 g/dL (ref 30.0–36.0)
MCV: 89.8 fL (ref 78.0–100.0)
MONO ABS: 0.5 10*3/uL (ref 0.1–1.0)
MONOS PCT: 12 % (ref 3–12)
NEUTROS ABS: 2.7 10*3/uL (ref 1.7–7.7)
Neutrophils Relative %: 64 % (ref 43–77)
Platelets: 121 10*3/uL — ABNORMAL LOW (ref 150–400)
RBC: 3.81 MIL/uL — ABNORMAL LOW (ref 3.87–5.11)
RDW: 17.6 % — ABNORMAL HIGH (ref 11.5–15.5)
WBC: 4.2 10*3/uL (ref 4.0–10.5)

## 2013-03-09 LAB — URINALYSIS, ROUTINE W REFLEX MICROSCOPIC
BILIRUBIN URINE: NEGATIVE
Glucose, UA: NEGATIVE mg/dL
HGB URINE DIPSTICK: NEGATIVE
Ketones, ur: NEGATIVE mg/dL
Nitrite: NEGATIVE
PH: 5.5 (ref 5.0–8.0)
Protein, ur: 100 mg/dL — AB
Specific Gravity, Urine: 1.035 — ABNORMAL HIGH (ref 1.005–1.030)
UROBILINOGEN UA: 0.2 mg/dL (ref 0.0–1.0)

## 2013-03-09 LAB — COMPREHENSIVE METABOLIC PANEL
ALT: 13 U/L (ref 0–35)
AST: 22 U/L (ref 0–37)
Albumin: 2.6 g/dL — ABNORMAL LOW (ref 3.5–5.2)
Alkaline Phosphatase: 75 U/L (ref 39–117)
BILIRUBIN TOTAL: 0.5 mg/dL (ref 0.3–1.2)
BUN: 43 mg/dL — AB (ref 6–23)
CHLORIDE: 103 meq/L (ref 96–112)
CO2: 22 mEq/L (ref 19–32)
CREATININE: 1.97 mg/dL — AB (ref 0.50–1.10)
Calcium: 8.3 mg/dL — ABNORMAL LOW (ref 8.4–10.5)
GFR calc non Af Amer: 26 mL/min — ABNORMAL LOW (ref >90)
GFR, EST AFRICAN AMERICAN: 30 mL/min — AB (ref >90)
Glucose, Bld: 152 mg/dL — ABNORMAL HIGH (ref 70–99)
Potassium: 5 mEq/L (ref 3.7–5.3)
Sodium: 139 mEq/L (ref 137–147)
Total Protein: 6.8 g/dL (ref 6.0–8.3)

## 2013-03-09 LAB — GLUCOSE, CAPILLARY
GLUCOSE-CAPILLARY: 160 mg/dL — AB (ref 70–99)
Glucose-Capillary: 125 mg/dL — ABNORMAL HIGH (ref 70–99)
Glucose-Capillary: 166 mg/dL — ABNORMAL HIGH (ref 70–99)
Glucose-Capillary: 126 mg/dL — ABNORMAL HIGH (ref 70–99)

## 2013-03-09 LAB — URINE MICROSCOPIC-ADD ON

## 2013-03-09 MED ORDER — ISOSORBIDE DINITRATE 20 MG PO TABS
20.0000 mg | ORAL_TABLET | Freq: Two times a day (BID) | ORAL | Status: DC
  Administered 2013-03-09 – 2013-03-10 (×4): 20 mg via ORAL
  Filled 2013-03-09 (×6): qty 1

## 2013-03-09 MED ORDER — SODIUM CHLORIDE 0.9 % IV SOLN
INTRAVENOUS | Status: DC
  Administered 2013-03-09: 08:00:00 via INTRAVENOUS

## 2013-03-09 MED ORDER — TECHNETIUM TC 99M MEBROFENIN IV KIT
5.0000 | PACK | Freq: Once | INTRAVENOUS | Status: AC | PRN
  Administered 2013-03-09: 5 via INTRAVENOUS

## 2013-03-09 MED ORDER — IOHEXOL 300 MG/ML  SOLN
25.0000 mL | INTRAMUSCULAR | Status: AC
  Administered 2013-03-09: 25 mL via ORAL

## 2013-03-09 MED ORDER — SODIUM CHLORIDE 0.9 % IV SOLN: INTRAVENOUS | Status: AC

## 2013-03-09 MED ORDER — DEXTROSE 5 % IV SOLN
1.0000 g | INTRAVENOUS | Status: DC
  Administered 2013-03-09 – 2013-03-16 (×8): 1 g via INTRAVENOUS
  Filled 2013-03-09 (×11): qty 10

## 2013-03-09 MED ORDER — HYDRALAZINE HCL 25 MG PO TABS
25.0000 mg | ORAL_TABLET | Freq: Three times a day (TID) | ORAL | Status: DC
  Administered 2013-03-09 – 2013-03-10 (×3): 25 mg via ORAL
  Filled 2013-03-09 (×6): qty 1

## 2013-03-09 NOTE — Progress Notes (Addendum)
Patient Demographics  [name] [name], is a 63 y.o. female, DOB - 18-Apr-1950, [id number]  Admit date - 03/05/2013   Admitting Physician Shanda Howells, MD  Outpatient Primary MD for the patient is No PCP Per Patient  LOS - 4   Chief Complaint  Patient presents with  . Shortness of Breath        Assessment & Plan    1.SOB due to Pl effusions and acute on chronic systolic and diastolic CHF - likely transudative due to underlying CHF, improved with diuresis which has been stopped on 03/07/2013, question of loculated effusion for which pulmonary is already following the patient.     2. Acute on chronic systolic and diastolic CHF exacerbation with severe mitral valve stenosis noted on TTE done this admission. 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   Continue beta blocker, will resume ACE inhibitor acute renal failure resolves.     3. ARF on Chronic kidney disease stage III. Baseline creatinine between 1.4 and 1.5. Likely due to IV contrast during left heart catheterization, withhold ACE/ARB, IV fluids, repeat BMP in the morning.     4. 16 beat run of asymptomatic V. tach on 03/06/2013. Troponins trended are negative, she has been placed on better blocker, electrolytes including magnesium stable. Cardiology following.      5. Mild right upper quadrant abdominal pain. Liver enzymes are stable, continues to have on and off right upper quadrant pain, stable right upper quadrant  ultrasound, question if this is acute cholecystitis, will check a HIDA scan to complete workup.        6.DM-2 - A1c at 6.8, glycemic control is acceptable and GERD.   CBG (last 3)   Recent Labs  03/08/13 1615 03/08/13 2129 03/09/13 0631  GLUCAP 136* 128* 125*     Lab Results  Component Value Date   HGBA1C 6.8* 03/06/2013      7. Hypertension - added as needed hydralazine IV for better control, continue on Lopressor.      Code Status: Full  Family Communication: Discussed with her son in detail 03/09/2013 over the phone  Disposition Plan: Home   Procedures TTE, left heart cath, right upper quadrant ultrasound, HIDA scan   Consults  Cards, Renal   Medications  Scheduled Meds: . heparin subcutaneous  5,000 Units Subcutaneous Q8H  . insulin aspart  0-9 Units Subcutaneous TID WC  . metoprolol tartrate  50 mg Oral BID  . sodium chloride  3 mL Intravenous Q12H  . sodium chloride  3 mL Intravenous Q12H   Continuous Infusions: . sodium chloride     PRN Meds:.hydrALAZINE, HYDROcodone-acetaminophen, ondansetron, sodium chloride  DVT Prophylaxis    Heparin    Lab Results  Component Value Date   PLT 121* 03/09/2013    Antibiotics    Anti-infectives   None          Subjective:   [name] [name] today has, No headache, No chest pain,   No Nausea, No new  weakness tingling or numbness, No Cough - SOB. Mild RUQ abd pain.  Objective:   Filed Vitals:   03/08/13 1419 03/08/13 1616 03/08/13 1900 03/09/13 0449  BP: 151/68 165/73 142/75 161/80  Pulse: 63 68 60 58  Temp: 97.5 F (36.4 C) 97.9 F (36.6 C) 98.5 F (36.9 C) 98.1 F (36.7 C)  TempSrc: Oral Oral Oral Oral  Resp:  18 18 18   Height:      Weight:    52.2 kg (115 lb 1.3 oz)  SpO2: 96% 94% 91% 96%    Wt Readings from Last 3 Encounters:  03/09/13 52.2 kg (115 lb 1.3 oz)  03/09/13 52.2 kg (115 lb 1.3 oz)  03/05/13 55.611 kg (122 lb 9.6 oz)     Intake/Output Summary (Last 24 hours) at  03/09/13 1017 Last data filed at 03/09/13 0900  Gross per 24 hour  Intake    200 ml  Output      0 ml  Net    200 ml    Exam Awake Alert, Oriented X 3, No new F.N deficits, Normal affect [place].AT,PERRAL Supple Neck,No JVD, No cervical lymphadenopathy appriciated.  Symmetrical Chest wall movement, Good air movement bilaterally, CTAB RRR,No Gallops,Rubs or new Murmurs, No Parasternal Heave +ve B.Sounds, Abd Soft, mild RUQ tenderness, No organomegaly appriciated, No rebound - guarding or rigidity. No Cyanosis, Clubbing or edema, No new Rash or bruise      Data Review   Micro Results No results found for this or any previous visit (from the past 240 hour(s)).  Radiology Reports Dg Chest 2 View  03/05/2013   CLINICAL DATA:  Bilateral pleural effusions and cardiomegaly. Cough without CHF.  EXAM: CHEST  2 VIEW  COMPARISON:  01/19/2013.  FINDINGS: Little change in the chest radiograph compared to prior. The cardiopericardial silhouette is enlarged and partially obscured by would appear to be bilateral subpulmonic pleural effusions. There is blunting of both costophrenic angles. The effusions may be loculated anteriorly. The pulmonary vascularity is mildly increased. Aortic arch atherosclerosis.  IMPRESSION: Cardiomegaly. Likely anteriorly loculated bilateral chronic pleural effusions, little changed from 01/19/2013.   Electronically Signed   By: Dereck Ligas M.D.   On: 03/05/2013 20:14   Dg Chest Right Decubitus  03/05/2013   CLINICAL DATA:  Evaluate effusions.  EXAM: CHEST - RIGHT DECUBITUS  COMPARISON:  Same day left-sided down radiograph and PA and lateral radiographs of the chest.  FINDINGS: With the right side down, the right pleural effusion layers dependently. The majority of the left pleural effusion layers however there is a portion that persists along the periphery of the left chest wall.  IMPRESSION: Layering right pleural effusion.  Layering and partially loculated left pleural  effusion along the left chest wall.   Electronically Signed   By: Carlos Levering M.D.   On: 03/05/2013 22:51   Dg Chest Left Decubitus  03/05/2013   CLINICAL DATA:  Evaluate effusions  EXAM: CHEST - LEFT DECUBITUS  COMPARISON:  03/05/2013 chest radiograph  FINDINGS: Left side down radiograph shows the pleural effusions to layer dependently. No appreciable pneumothorax. Cardiac enlargement. Central vascular congestion. Perihilar consolidation on the right appear  IMPRESSION: Layering pleural effusions with left side down.  Right perihilar vascular congestion and consolidation; pneumonia or atelectasis. Recommend follow up to document resolution.   Electronically Signed   By: Carlos Levering M.D.   On: 03/05/2013 22:49    CBC  Recent Labs Lab 03/06/13 0532 03/07/13 0405 03/08/13 [id number] 03/08/13 1335 03/09/13 [id number]  WBC 5.5 5.1 3.7* 4.4 4.2  HGB 10.8* 10.6* 10.8* 11.6* 11.4*  HCT 31.8* 31.8* 32.2* 34.6* 34.2*  PLT 127* 104* 117* 125* 121*  MCV 88.8 88.8 89.4 89.6 89.8  MCH 30.2 29.6 30.0 30.1 29.9  MCHC 34.0 33.3 33.5 33.5 33.3  RDW 16.4* 16.8* 17.1* 17.1* 17.6*  LYMPHSABS 1.5 1.3 1.1  --  0.9  MONOABS 0.4 0.7 0.3  --  0.5  EOSABS 1.3* 1.4* 0.0  --  0.1  BASOSABS 0.1 0.1 0.1  --  0.0    Chemistries   Recent Labs Lab 03/05/13 2350 03/06/13 0532 03/07/13 0405 03/08/13 0339 03/08/13 1335 03/09/13 0437  NA 137 140 139 141  --  139  K 4.8 4.8 4.5 5.1  --  5.0  CL 107 105 104 103  --  103  CO2  --  23 24 22   --  22  GLUCOSE 128* 116* 110* 112*  --  152*  BUN 37* 38* 38* 39*  --  43*  CREATININE 1.30* 1.35* 1.44* 1.50* 1.57* 1.97*  CALCIUM  --  8.4 8.4 8.5  --  8.3*  MG  --   --  2.9*  --   --   --   AST  --  20  --  23  --  22  ALT  --  13  --  13  --  13  ALKPHOS  --  87  --  82  --  75  BILITOT  --  0.4  --  0.5  --  0.5   ------------------------------------------------------------------------------------------------------------------ estimated creatinine clearance is  21.6 ml/min (by C-G formula based on Cr of 1.97). ------------------------------------------------------------------------------------------------------------------ No results found for this basename: HGBA1C,  in the last 72 hours ------------------------------------------------------------------------------------------------------------------ No results found for this basename: CHOL, HDL, LDLCALC, TRIG, CHOLHDL, LDLDIRECT,  in the last 72 hours ------------------------------------------------------------------------------------------------------------------ No results found for this basename: TSH, T4TOTAL, FREET3, T3FREE, THYROIDAB,  in the last 72 hours ------------------------------------------------------------------------------------------------------------------ No results found for this basename: VITAMINB12, FOLATE, FERRITIN, TIBC, IRON, RETICCTPCT,  in the last 72 hours  Coagulation profile  Recent Labs Lab 03/08/13 0339  INR 1.12    No results found for this basename: DDIMER,  in the last 72 hours  Cardiac Enzymes  Recent Labs Lab 03/06/13 0102 03/06/13 0805 03/06/13 1245  TROPONINI <0.30 <0.30 <0.30   ------------------------------------------------------------------------------------------------------------------ No components found with this basename: POCBNP,      Time Spent in minutes   35   SINGH,PRASHANT K M.D on 03/09/2013 at 10:17 AM  Between 7am to 7pm - Pager - [phone]  After 7pm go to [email or web address] - password [email or web address]  And look for the night coverage person covering for me after hours  [facility] Office  [phone]

## 2013-03-09 NOTE — Progress Notes (Signed)
Patient Name: [name] [name] Date of Encounter: 03/09/2013  Active Problems:   Pleural effusion, bilateral   CHF exacerbation   Length of Stay: 4  SUBJECTIVE  She has nausea and anorexia, but no dyspnea at rest or walking to bathroom. No edema. No angina In/out not recorded accurately. Weight unchanged last 2 days.  CURRENT MEDS . heparin subcutaneous  5,000 Units Subcutaneous Q8H  . insulin aspart  0-9 Units Subcutaneous TID WC  . metoprolol tartrate  50 mg Oral BID  . sodium chloride  3 mL Intravenous Q12H  . sodium chloride  3 mL Intravenous Q12H    OBJECTIVE  Filed Vitals:   03/08/13 1419 03/08/13 1616 03/08/13 1900 03/09/13 0449  BP: 151/68 165/73 142/75 161/80  Pulse: 63 68 60 58  Temp: 97.5 F (36.4 C) 97.9 F (36.6 C) 98.5 F (36.9 C) 98.1 F (36.7 C)  TempSrc: Oral Oral Oral Oral  Resp:  18 18 18   Height:      Weight:    52.2 kg (115 lb 1.3 oz)  SpO2: 96% 94% 91% 96%    Intake/Output Summary (Last 24 hours) at 03/09/13 1207 Last data filed at 03/09/13 0900  Gross per 24 hour  Intake    200 ml  Output      0 ml  Net    200 ml   Filed Weights   03/07/13 0439 03/08/13 0214 03/09/13 0449  Weight: 52.8 kg (116 lb 6.5 oz) 51.7 kg (113 lb 15.7 oz) 52.2 kg (115 lb 1.3 oz)    PHYSICAL EXAM   General: Alert, oriented x3, no distress Head: no evidence of trauma, PERRL, EOMI, no exophtalmos or lid lag, no myxedema, no xanthelasma; normal ears, nose and oropharynx Neck: elevated jugular venous pulsations (6-7 cm) and +ve hepatojugular reflux; brisk carotid pulses without delay and no carotid bruits Chest: clear to auscultation, no signs of consolidation by percussion or palpation, normal fremitus, symmetrical and full respiratory excursions Cardiovascular: normal position and quality of the apical impulse, regular rhythm, normal first heart sound, loud second heart sound (rather resonant), no murmurs, rubs or gallops Abdomen: no tenderness or distention, no masses  by palpation, no abnormal pulsatility or arterial bruits, normal bowel sounds, no hepatosplenomegaly Extremities: no clubbing, cyanosis or edema; 2+ radial, ulnar and brachial pulses bilaterally; 2+ right femoral, posterior tibial and dorsalis pedis pulses; 2+ left femoral, posterior tibial and dorsalis pedis pulses; no subclavian or femoral bruits Neurological: grossly nonfocal    CBC  Recent Labs  03/08/13 0339 03/08/13 1335 03/09/13 0437  WBC 3.7* 4.4 4.2  NEUTROABS 2.2  --  2.7  HGB 10.8* 11.6* 11.4*  HCT 32.2* 34.6* 34.2*  MCV 89.4 89.6 89.8  PLT 117* 125* 989*   Basic Metabolic Panel  Recent Labs  03/07/13 0405 03/08/13 0339 03/08/13 1335 03/09/13 0437  NA 139 141  --  139  K 4.5 5.1  --  5.0  CL 104 103  --  103  CO2 24 22  --  22  GLUCOSE 110* 112*  --  152*  BUN 38* 39*  --  43*  CREATININE 1.44* 1.50* 1.57* 1.97*  CALCIUM 8.4 8.5  --  8.3*  MG 2.9*  --   --   --    Liver Function Tests  Recent Labs  03/08/13 0339 03/09/13 0437  AST 23 22  ALT 13 13  ALKPHOS 82 75  BILITOT 0.5 0.5  PROT 6.9 6.8  ALBUMIN 2.7* 2.6*    Recent  Labs  03/07/13 0405  LIPASE 44    TELE NSR  ECG NSR, low voltage in precordial leads   ASSESSMENT AND PLAN She still has signs of hypervolemia, although she has clearly improved. Marked deterioration in renal function may be related top rapid diuresis, but agree with concern re: contrast induced nephrotoxicity. She is very hypertensive and will benefit from vasodilator therapy - not ACEi/ARB until renal function stabilizes. Start hydralazine/nitrates.   Sanda Klein, MD, [facility] [phone] office [phone] pager 03/09/2013 12:07 PM

## 2013-03-10 ENCOUNTER — Inpatient Hospital Stay (HOSPITAL_COMMUNITY): Payer: Medicaid Other

## 2013-03-10 DIAGNOSIS — E119 Type 2 diabetes mellitus without complications: Secondary | ICD-10-CM

## 2013-03-10 DIAGNOSIS — I2510 Atherosclerotic heart disease of native coronary artery without angina pectoris: Secondary | ICD-10-CM

## 2013-03-10 LAB — CBC WITH DIFFERENTIAL/PLATELET
BASOS ABS: 0 10*3/uL (ref 0.0–0.1)
BASOS PCT: 1 % (ref 0–1)
EOS ABS: 0.1 10*3/uL (ref 0.0–0.7)
EOS PCT: 2 % (ref 0–5)
HCT: 32 % — ABNORMAL LOW (ref 36.0–46.0)
Hemoglobin: 10.8 g/dL — ABNORMAL LOW (ref 12.0–15.0)
LYMPHS PCT: 24 % (ref 12–46)
Lymphs Abs: 1.2 10*3/uL (ref 0.7–4.0)
MCH: 30.2 pg (ref 26.0–34.0)
MCHC: 33.8 g/dL (ref 30.0–36.0)
MCV: 89.4 fL (ref 78.0–100.0)
Monocytes Absolute: 0.6 10*3/uL (ref 0.1–1.0)
Monocytes Relative: 12 % (ref 3–12)
Neutro Abs: 3.1 10*3/uL (ref 1.7–7.7)
Neutrophils Relative %: 61 % (ref 43–77)
PLATELETS: 110 10*3/uL — AB (ref 150–400)
RBC: 3.58 MIL/uL — ABNORMAL LOW (ref 3.87–5.11)
RDW: 17.4 % — AB (ref 11.5–15.5)
WBC: 5.1 10*3/uL (ref 4.0–10.5)

## 2013-03-10 LAB — PROTEIN / CREATININE RATIO, URINE
Creatinine, Urine: 80.51 mg/dL
Protein Creatinine Ratio: 1.04 — ABNORMAL HIGH (ref 0.00–0.15)
Total Protein, Urine: 84 mg/dL

## 2013-03-10 LAB — URINALYSIS W MICROSCOPIC + REFLEX CULTURE
Bilirubin Urine: NEGATIVE
Glucose, UA: NEGATIVE mg/dL
Hgb urine dipstick: NEGATIVE
Ketones, ur: NEGATIVE mg/dL
Leukocytes, UA: NEGATIVE
Nitrite: NEGATIVE
Protein, ur: 100 mg/dL — AB
Specific Gravity, Urine: 1.021 (ref 1.005–1.030)
Urobilinogen, UA: 0.2 mg/dL (ref 0.0–1.0)
pH: 5.5 (ref 5.0–8.0)

## 2013-03-10 LAB — COMPREHENSIVE METABOLIC PANEL
ALBUMIN: 2.6 g/dL — AB (ref 3.5–5.2)
ALT: 13 U/L (ref 0–35)
AST: 25 U/L (ref 0–37)
Alkaline Phosphatase: 70 U/L (ref 39–117)
BUN: 47 mg/dL — ABNORMAL HIGH (ref 6–23)
CALCIUM: 8.1 mg/dL — AB (ref 8.4–10.5)
CO2: 21 mEq/L (ref 19–32)
Chloride: 103 mEq/L (ref 96–112)
Creatinine, Ser: 2.67 mg/dL — ABNORMAL HIGH (ref 0.50–1.10)
GFR calc Af Amer: 21 mL/min — ABNORMAL LOW (ref >90)
GFR calc non Af Amer: 18 mL/min — ABNORMAL LOW (ref >90)
Glucose, Bld: 113 mg/dL — ABNORMAL HIGH (ref 70–99)
Potassium: 5 mEq/L (ref 3.7–5.3)
SODIUM: 139 meq/L (ref 137–147)
TOTAL PROTEIN: 6.7 g/dL (ref 6.0–8.3)
Total Bilirubin: 0.4 mg/dL (ref 0.3–1.2)

## 2013-03-10 LAB — GLUCOSE, CAPILLARY
Glucose-Capillary: 100 mg/dL — ABNORMAL HIGH (ref 70–99)
Glucose-Capillary: 130 mg/dL — ABNORMAL HIGH (ref 70–99)
Glucose-Capillary: 173 mg/dL — ABNORMAL HIGH (ref 70–99)
Glucose-Capillary: 117 mg/dL — ABNORMAL HIGH (ref 70–99)

## 2013-03-10 LAB — SODIUM, URINE, RANDOM: Sodium, Ur: 59 mEq/L

## 2013-03-10 LAB — CREATININE, URINE, RANDOM: Creatinine, Urine: 79.26 mg/dL

## 2013-03-10 MED ORDER — FUROSEMIDE 10 MG/ML IJ SOLN
80.0000 mg | Freq: Once | INTRAMUSCULAR | Status: AC
  Administered 2013-03-10: 80 mg via INTRAVENOUS
  Filled 2013-03-10: qty 8

## 2013-03-10 MED ORDER — SODIUM CHLORIDE 0.9 % IV SOLN
INTRAVENOUS | Status: AC
  Administered 2013-03-10: 09:00:00 via INTRAVENOUS

## 2013-03-10 MED ORDER — HYDRALAZINE HCL 25 MG PO TABS
37.5000 mg | ORAL_TABLET | Freq: Three times a day (TID) | ORAL | Status: DC
  Administered 2013-03-10 – 2013-03-15 (×14): 37.5 mg via ORAL
  Filled 2013-03-10 (×18): qty 1.5

## 2013-03-10 NOTE — Progress Notes (Signed)
Subjective:  Markedly oliguric (if records are accurate) Rapid increase in creatinine overnight No dyspnea or angina. Weight stable  Objective:  Temp:  [97.5 F (36.4 C)-97.9 F (36.6 C)] 97.9 F (36.6 C) (02/01 0500) Pulse Rate:  [54-57] 57 (02/01 0500) Resp:  [18-20] 20 (02/01 0500) BP: (132-160)/(65-84) 160/84 mmHg (02/01 1011) SpO2:  [97 %-99 %] 97 % (02/01 0500) Weight:  [53.6 kg (118 lb 2.7 oz)] 53.6 kg (118 lb 2.7 oz) (02/01 0500) Weight change: 1.4 kg (3 lb 1.4 oz)  Intake/Output from previous day: 01/31 0701 - 02/01 0700 In: 512.5 [I.V.:512.5] Out: 75 [Urine:75]  Intake/Output from this shift: Total I/O In: 240 [P.O.:240] Out: -   Medications: Current Facility-Administered Medications  Medication Dose Route Frequency Provider Last Rate Last Dose  . 0.9 %  sodium chloride infusion   Intravenous Continuous Thurnell Lose, MD 100 mL/hr at 03/10/13 0902    . cefTRIAXone (ROCEPHIN) 1 g in dextrose 5 % 50 mL IVPB  1 g Intravenous Q24H Thurnell Lose, MD   1 g at 03/09/13 1949  . heparin injection 5,000 Units  5,000 Units Subcutaneous Q8H Dayna N Dunn, PA-C   5,000 Units at 03/10/13 0531  . hydrALAZINE (APRESOLINE) injection 20 mg  20 mg Intravenous Q6H PRN Leonie Man, MD      . hydrALAZINE (APRESOLINE) tablet 25 mg  25 mg Oral Q8H Lene Mckay, MD   25 mg at 03/10/13 0531  . HYDROcodone-acetaminophen (NORCO/VICODIN) 5-325 MG per tablet 1 tablet  1 tablet Oral Q6H PRN Thurnell Lose, MD   1 tablet at 03/07/13 1301  . insulin aspart (novoLOG) injection 0-9 Units  0-9 Units Subcutaneous TID WC Shanda Howells, MD   1 Units at 03/09/13 1639  . isosorbide dinitrate (ISORDIL) tablet 20 mg  20 mg Oral BID Govani Radloff, MD   20 mg at 03/10/13 1011  . metoprolol (LOPRESSOR) tablet 50 mg  50 mg Oral BID Thurnell Lose, MD   50 mg at 03/10/13 1011  . ondansetron (ZOFRAN) injection 4 mg  4 mg Intravenous Q6H PRN Thurnell Lose, MD   4 mg at 03/09/13 1150  .  sodium chloride 0.9 % injection 3 mL  3 mL Intravenous Q12H Shanda Howells, MD   3 mL at 03/10/13 1031  . sodium chloride 0.9 % injection 3 mL  3 mL Intravenous Q12H Shanda Howells, MD   3 mL at 03/09/13 1150  . sodium chloride 0.9 % injection 3 mL  3 mL Intravenous PRN Shanda Howells, MD        Physical Exam: General: Alert, oriented x3, no distress  Head: no evidence of trauma, PERRL, EOMI, no exophtalmos or lid lag, no myxedema, no xanthelasma; normal ears, nose and oropharynx  Neck: elevated jugular venous pulsations (10 cm) and +ve hepatojugular reflux; brisk carotid pulses without delay and no carotid bruits  Chest: clear to auscultation,reduced BS and dullness to percussion in both lung bases, normal fremitus, symmetrical and full respiratory excursions  Cardiovascular: normal position and quality of the apical impulse, regular rhythm, normal first heart sound, loud second heart sound (rather resonant), no murmurs, rubs or gallops  Abdomen: no tenderness or distention, no masses by palpation, no abnormal pulsatility or arterial bruits, normal bowel sounds, no hepatosplenomegaly  Extremities: no clubbing, cyanosis or edema; 2+ radial, ulnar and brachial pulses bilaterally; 2+ right femoral, posterior tibial and dorsalis pedis pulses; 2+ left femoral, posterior tibial and dorsalis pedis pulses; no subclavian  or femoral bruits  Neurological: grossly nonfocal   Lab Results: Results for orders placed during the hospital encounter of 03/05/13 (from the past 48 hour(s))  GLUCOSE, CAPILLARY     Status: None   Collection Time    03/08/13 11:44 AM      Result Value Range   Glucose-Capillary 85  70 - 99 mg/dL  GLUCOSE, CAPILLARY     Status: Abnormal   Collection Time    03/08/13 12:44 PM      Result Value Range   Glucose-Capillary 110 (*) 70 - 99 mg/dL  CBC     Status: Abnormal   Collection Time    03/08/13  1:35 PM      Result Value Range   WBC 4.4  4.0 - 10.5 K/uL   RBC 3.86 (*) 3.87 -  5.11 MIL/uL   Hemoglobin 11.6 (*) 12.0 - 15.0 g/dL   HCT 34.6 (*) 36.0 - 46.0 %   MCV 89.6  78.0 - 100.0 fL   MCH 30.1  26.0 - 34.0 pg   MCHC 33.5  30.0 - 36.0 g/dL   RDW 17.1 (*) 11.5 - 15.5 %   Platelets 125 (*) 150 - 400 K/uL  CREATININE, SERUM     Status: Abnormal   Collection Time    03/08/13  1:35 PM      Result Value Range   Creatinine, Ser 1.57 (*) 0.50 - 1.10 mg/dL   GFR calc non Af Amer 34 (*) >90 mL/min   GFR calc Af Amer 39 (*) >90 mL/min   Comment: (NOTE)     The eGFR has been calculated using the CKD EPI equation.     This calculation has not been validated in all clinical situations.     eGFR's persistently <90 mL/min signify possible Chronic Kidney     Disease.  GLUCOSE, CAPILLARY     Status: Abnormal   Collection Time    03/08/13  4:15 PM      Result Value Range   Glucose-Capillary 136 (*) 70 - 99 mg/dL  GLUCOSE, CAPILLARY     Status: Abnormal   Collection Time    03/08/13  9:29 PM      Result Value Range   Glucose-Capillary 128 (*) 70 - 99 mg/dL  CBC WITH DIFFERENTIAL     Status: Abnormal   Collection Time    03/09/13  4:37 AM      Result Value Range   WBC 4.2  4.0 - 10.5 K/uL   RBC 3.81 (*) 3.87 - 5.11 MIL/uL   Hemoglobin 11.4 (*) 12.0 - 15.0 g/dL   HCT 34.2 (*) 36.0 - 46.0 %   MCV 89.8  78.0 - 100.0 fL   MCH 29.9  26.0 - 34.0 pg   MCHC 33.3  30.0 - 36.0 g/dL   RDW 17.6 (*) 11.5 - 15.5 %   Platelets 121 (*) 150 - 400 K/uL   Neutrophils Relative % 64  43 - 77 %   Neutro Abs 2.7  1.7 - 7.7 K/uL   Lymphocytes Relative 22  12 - 46 %   Lymphs Abs 0.9  0.7 - 4.0 K/uL   Monocytes Relative 12  3 - 12 %   Monocytes Absolute 0.5  0.1 - 1.0 K/uL   Eosinophils Relative 1  0 - 5 %   Eosinophils Absolute 0.1  0.0 - 0.7 K/uL   Basophils Relative 1  0 - 1 %   Basophils Absolute 0.0  0.0 - 0.1 K/uL  COMPREHENSIVE METABOLIC PANEL     Status: Abnormal   Collection Time    03/09/13  4:37 AM      Result Value Range   Sodium 139  137 - 147 mEq/L   Potassium 5.0   3.7 - 5.3 mEq/L   Chloride 103  96 - 112 mEq/L   CO2 22  19 - 32 mEq/L   Glucose, Bld 152 (*) 70 - 99 mg/dL   BUN 43 (*) 6 - 23 mg/dL   Creatinine, Ser 1.97 (*) 0.50 - 1.10 mg/dL   Calcium 8.3 (*) 8.4 - 10.5 mg/dL   Total Protein 6.8  6.0 - 8.3 g/dL   Albumin 2.6 (*) 3.5 - 5.2 g/dL   AST 22  0 - 37 U/L   ALT 13  0 - 35 U/L   Alkaline Phosphatase 75  39 - 117 U/L   Total Bilirubin 0.5  0.3 - 1.2 mg/dL   GFR calc non Af Amer 26 (*) >90 mL/min   GFR calc Af Amer 30 (*) >90 mL/min   Comment: (NOTE)     The eGFR has been calculated using the CKD EPI equation.     This calculation has not been validated in all clinical situations.     eGFR's persistently <90 mL/min signify possible Chronic Kidney     Disease.  GLUCOSE, CAPILLARY     Status: Abnormal   Collection Time    03/09/13  6:31 AM      Result Value Range   Glucose-Capillary 125 (*) 70 - 99 mg/dL  GLUCOSE, CAPILLARY     Status: Abnormal   Collection Time    03/09/13 11:47 AM      Result Value Range   Glucose-Capillary 166 (*) 70 - 99 mg/dL  GLUCOSE, CAPILLARY     Status: Abnormal   Collection Time    03/09/13  4:08 PM      Result Value Range   Glucose-Capillary 126 (*) 70 - 99 mg/dL  URINALYSIS, ROUTINE W REFLEX MICROSCOPIC     Status: Abnormal   Collection Time    03/09/13  7:16 PM      Result Value Range   Color, Urine YELLOW  YELLOW   APPearance CLEAR  CLEAR   Specific Gravity, Urine 1.035 (*) 1.005 - 1.030   pH 5.5  5.0 - 8.0   Glucose, UA NEGATIVE  NEGATIVE mg/dL   Hgb urine dipstick NEGATIVE  NEGATIVE   Bilirubin Urine NEGATIVE  NEGATIVE   Ketones, ur NEGATIVE  NEGATIVE mg/dL   Protein, ur 100 (*) NEGATIVE mg/dL   Urobilinogen, UA 0.2  0.0 - 1.0 mg/dL   Nitrite NEGATIVE  NEGATIVE   Leukocytes, UA SMALL (*) NEGATIVE  URINE MICROSCOPIC-ADD ON     Status: Abnormal   Collection Time    03/09/13  7:16 PM      Result Value Range   Squamous Epithelial / LPF FEW (*) RARE   WBC, UA 0-2  <3 WBC/hpf  GLUCOSE,  CAPILLARY     Status: Abnormal   Collection Time    03/09/13  9:07 PM      Result Value Range   Glucose-Capillary 160 (*) 70 - 99 mg/dL  CBC WITH DIFFERENTIAL     Status: Abnormal   Collection Time    03/10/13  5:16 AM      Result Value Range   WBC 5.1  4.0 - 10.5 K/uL   RBC 3.58 (*) 3.87 - 5.11 MIL/uL   Hemoglobin 10.8 (*)  12.0 - 15.0 g/dL   HCT 32.0 (*) 36.0 - 46.0 %   MCV 89.4  78.0 - 100.0 fL   MCH 30.2  26.0 - 34.0 pg   MCHC 33.8  30.0 - 36.0 g/dL   RDW 17.4 (*) 11.5 - 15.5 %   Platelets 110 (*) 150 - 400 K/uL   Comment: CONSISTENT WITH PREVIOUS RESULT   Neutrophils Relative % 61  43 - 77 %   Neutro Abs 3.1  1.7 - 7.7 K/uL   Lymphocytes Relative 24  12 - 46 %   Lymphs Abs 1.2  0.7 - 4.0 K/uL   Monocytes Relative 12  3 - 12 %   Monocytes Absolute 0.6  0.1 - 1.0 K/uL   Eosinophils Relative 2  0 - 5 %   Eosinophils Absolute 0.1  0.0 - 0.7 K/uL   Basophils Relative 1  0 - 1 %   Basophils Absolute 0.0  0.0 - 0.1 K/uL  COMPREHENSIVE METABOLIC PANEL     Status: Abnormal   Collection Time    03/10/13  5:16 AM      Result Value Range   Sodium 139  137 - 147 mEq/L   Potassium 5.0  3.7 - 5.3 mEq/L   Chloride 103  96 - 112 mEq/L   CO2 21  19 - 32 mEq/L   Glucose, Bld 113 (*) 70 - 99 mg/dL   BUN 47 (*) 6 - 23 mg/dL   Creatinine, Ser 2.67 (*) 0.50 - 1.10 mg/dL   Calcium 8.1 (*) 8.4 - 10.5 mg/dL   Total Protein 6.7  6.0 - 8.3 g/dL   Albumin 2.6 (*) 3.5 - 5.2 g/dL   AST 25  0 - 37 U/L   ALT 13  0 - 35 U/L   Alkaline Phosphatase 70  39 - 117 U/L   Total Bilirubin 0.4  0.3 - 1.2 mg/dL   GFR calc non Af Amer 18 (*) >90 mL/min   GFR calc Af Amer 21 (*) >90 mL/min   Comment: (NOTE)     The eGFR has been calculated using the CKD EPI equation.     This calculation has not been validated in all clinical situations.     eGFR's persistently <90 mL/min signify possible Chronic Kidney     Disease.    Imaging: Imaging results have been reviewed and Ct Abdomen Pelvis Wo  Contrast  03/09/2013   CLINICAL DATA:  Acute renal failure superimposed upon stage III chronic. Right upper quadrant abdominal pain. Cardiac catheterization yesterday due to new onset systolic heart failure which revealed severe 2 vessel coronary disease (100% right coronary and diffuse 80 is a 95% subtotal occlusion of the second obtuse marginal) and severe pulmonary arterial hypertension.  EXAM: CT ABDOMEN AND PELVIS WITHOUT CONTRAST  TECHNIQUE: Multidetector CT imaging of the abdomen and pelvis was performed following the standard protocol without intravenous contrast. Oral contrast was administered.  COMPARISON:  DG ABD PORTABLE 1V dated 03/07/2013; NM HEPATOBILIARY INCLUDE GB dated 03/09/2013; US ABDOMEN LIMITED RUQ/ASCITES dated 03/07/2013  FINDINGS: Residual contrast material within the renal parenchyma and the renal collecting system, ureters, and bladder related to the cardiac catheterization yesterday, consistent with the given history of acute renal failure. Heterogeneous appearance to the renal parenchyma bilaterally, with scarring involving the lower pole of the right kidney. No hydronephrosis. No filling defects within the opacified collecting systems, ureters, or bladder.  High attenuation material within the gallbladder is likely due to spurious excretion of the intravenous  and intra-arterial contrast into the bile. Normal unenhanced appearance of the liver, spleen, pancreas, and adrenal glands. Moderate aortoiliofemoral atherosclerosis without aneurysm. No significant lymphadenopathy.  Moderate ascites in the abdomen and pelvis. Stomach relatively decompressed and unremarkable. Normal-appearing small bowel. Scattered diverticula involving the relatively decompressed colon. Appendix not clearly visualized, but no pericecal inflammation.  Uterus atrophic consistent with age, containing dystrophic calcifications. No adnexal masses. Phleboliths in both sides of the pelvis.  Generalized body wall edema.  Bone window images demonstrate mild to moderate lower thoracic and lumbar spondylosis. Large right pleural effusion and associated passive atelectasis in the right lower lobe. Small left pleural effusion. Heart markedly enlarged with left ventricular predominance. Small pericardial effusion.  IMPRESSION: 1. No abnormalities are identified to explain right upper quadrant abdominal pain. 2. Residual contrast in the kidneys and in the renal collecting systems, ureters, and bladder related to the cardiac catheterization yesterday. This would be consistent with the given history of acute renal failure. 3. Heterogeneous residual enhancement of the kidneys; in the proper clinical situation, this could represent pyelonephritis, but likely just represents areas of both kidneys that excreted the contrast more quickly. 4. Scarring involving the lower pole of the right kidney. 5. Moderate ascites. 6. Anasarca. 7. Bilateral pleural effusions, large on the right and small on the left. 8. Small pericardial effusion. 9. Spurious excretion of contrast (from yesterday's cardiac catheterization) into the bile, which can be seen in patients with acute renal failure.   Electronically Signed   By: Evangeline Dakin M.D.   On: 03/09/2013 16:21   Nm Hepatobiliary Liver Func  03/09/2013   CLINICAL DATA:  Right upper quadrant pain. Clinical suspicion for cholecystitis.  EXAM: NUCLEAR MEDICINE HEPATOBILIARY IMAGING  TECHNIQUE: Sequential images of the abdomen were obtained out to 60 minutes following intravenous administration of radiopharmaceutical.  COMPARISON:  None.  RADIOPHARMACEUTICALS:  5 mCi [id number]  FINDINGS: Prompt radiopharmaceutical uptake by the liver seen. Liver has a normal appearance.  Prompt biliary excretion of activity is seen. Gallbladder filling is seen initially on the 15 min image, with further filling seen for 60 min. Biliary activity reaches small bowel initially on the 20/5 min image.  IMPRESSION: Normal  study.  No evidence of cystic duct or biliary obstruction.   Electronically Signed   By: JEarle GellM.D.   On: 03/09/2013 11:09    Assessment:  Active Problems:   AKI (acute kidney injury)   Diabetes mellitus, type 2   Pleural effusion, bilateral   CHF exacerbation   CAD (coronary artery disease)   Plan:  1. Continue to titrate hydralazine and nitrates, but avoid excessive hypotension. Bradycardia precludes further adjustment in beta blocker  Time Spent Directly with Patient:  45 minutes  Length of Stay:  LOS: 5 days    [name] [name] 03/10/2013, 11:00 AM

## 2013-03-10 NOTE — Consult Note (Signed)
Renal Service Consult Note [facility]  [name] [name] 03/10/2013 Sol Blazing Requesting Physician:  Dr Candiss Norse  Reason for Consult:  Acute on CKD HPI: The patient is a 63 y.o. year-old w hx of DM, HTN and CHF admitted 1/28 with SOB and CHF exacerbation.  She has been seen Dec 2014 for CHF exacerbation and treated with lasix. Lasix had to be held for renal dysfunction. Then patient developed progressive SOB, cough , DOE and LE edema.  CXR on admit showed layering pleural effusions and vasc congestion.   Seen by cardiology who noted decline in LVEF from prior 50-55% to 30-35% now.  CKD III with creat 1.4.  Noted worsening AR and TR with pulm HTN also. Left and R heart cath were done on 1/30 showing occluded RCA and diffuse disease of OM2, severe pulm HTN 75-80/30, PCWP 30. Recommendations were for referral to [facility] for heart failure mgmnt and medical Rx of CAD.  Creat up after heart cath to 1.97 yesterday and 2.67 today.  CT abd done yest for abd pain showed ascites, anasarca, large R pleural effusion and dye in kidneys from heart cath the day before  Pt says today she "feels better". 75 cc uop recorded yesterday. Wt on admit 56 kg, down to 51.7 on 1/30, up to 53.6 today.  ROS  not available due to language barrier  Past Medical History  Past Medical History  Diagnosis Date  . Diabetes mellitus     Reportedly diagnosed 2011 but no medication initiated until 04/2011 (Metformin)  . Hypertension   . Pneumonia   . Diabetic peripheral neuropathy     Since 2013  . CHF (congestive heart failure) 08/2012  . Anemia 08/2012  . UTI (urinary tract infection) 08/22/2012   Past Surgical History  Past Surgical History  Procedure Laterality Date  . Cesarean section     Family History  Family History  Problem Relation Age of Onset  . Diabetes Brother     deceased in 86V dt DM complications   Social History  reports that she has never smoked. She has never used smokeless tobacco. She  reports that she does not drink alcohol or use illicit drugs. Allergies No Known Allergies Home medications Prior to Admission medications   Medication Sig Start Date End Date Taking? Authorizing Provider  metFORMIN (GLUCOPHAGE) 500 MG tablet Take 250 mg by mouth 2 (two) times daily with a meal.    Yes Historical Provider, MD  OVER THE COUNTER MEDICATION Take 1 tablet by mouth 2 (two) times daily. One a day vitamin for diabetics   Yes Historical Provider, MD  OVER THE COUNTER MEDICATION Take 15 mLs by mouth daily as needed (cough). Over the counter cough syrup   Yes Historical Provider, MD   Liver Function Tests  Recent Labs Lab 03/08/13 0339 03/09/13 0437 03/10/13 0516  AST 23 22 25   ALT 13 13 13   ALKPHOS 82 75 70  BILITOT 0.5 0.5 0.4  PROT 6.9 6.8 6.7  ALBUMIN 2.7* 2.6* 2.6*    Recent Labs Lab 03/07/13 0405  LIPASE 44   CBC  Recent Labs Lab 03/08/13 0339 03/08/13 1335 03/09/13 0437 03/10/13 0516  WBC 3.7* 4.4 4.2 5.1  NEUTROABS 2.2  --  2.7 3.1  HGB 10.8* 11.6* 11.4* 10.8*  HCT 32.2* 34.6* 34.2* 32.0*  MCV 89.4 89.6 89.8 89.4  PLT 117* 125* 121* 784*   Basic Metabolic Panel  Recent Labs Lab 03/05/13 2350 03/06/13 0532 03/07/13 0405 03/08/13 0339 03/08/13  1335 03/09/13 0437 03/10/13 0516  NA 137 140 139 141  --  139 139  K 4.8 4.8 4.5 5.1  --  5.0 5.0  CL 107 105 104 103  --  103 103  CO2  --  23 24 22   --  22 21  GLUCOSE 128* 116* 110* 112*  --  152* 113*  BUN 37* 38* 38* 39*  --  43* 47*  CREATININE 1.30* 1.35* 1.44* 1.50* 1.57* 1.97* 2.67*  CALCIUM  --  8.4 8.4 8.5  --  8.3* 8.1*    Exam  Blood pressure 147/65, pulse 57, temperature 97.9 F (36.6 C), temperature source Oral, resp. rate 20, height 4' 11.06" (1.5 m), weight 53.6 kg (118 lb 2.7 oz), SpO2 97.00%. Alert, no distress No rash, cyanosis or gangrene Sclera anicteric, throat clear +JVD , jvp around 12-14 Chest clear on L, dec'd R base RRR no rub or gallop Abd + ascites, soft,  nondistended Trace edema LE's no UE edema Neuro is nf, ox3  UA 5.5, 100 prot, 0-2 wbc, no rbc   Assessment 1 Acute kidney injury 2/2 dye 2 CKD stage 3 3 Severe syst HF w pulm HTN, ascites, pleural effusion 4 HTN, longstanding 5 DM , longstanding   Plan- supportive care, renal US in am, still vol excess but not symptomatic. Keep BP up, no acei/arb, will give IV lasix x 1, check urine lytes and UPC ratio  Kelly Splinter MD (pgr) [phone]    [phone] 03/10/2013, 9:20 AM

## 2013-03-10 NOTE — Progress Notes (Addendum)
Patient Demographics  [name] [name], is a 63 y.o. female, DOB - March 16, 1950, [id number]  Admit date - 03/05/2013   Admitting Physician Shanda Howells, MD  Outpatient Primary MD for the patient is No PCP Per Patient  LOS - 5   Chief Complaint  Patient presents with  . Shortness of Breath        Assessment & Plan    1.SOB due to Pl effusions and acute on chronic systolic and diastolic CHF - likely transudative due to underlying CHF, improved with diuresis which has been stopped on 03/07/2013, further fluid management per nephrology and cardiology, question of loculated effusion for which pulmonary is already following the patient.      2. Acute on chronic systolic and diastolic CHF exacerbation with severe mitral valve stenosis noted on TTE done this admission. Patient's EF has dropped considerably in the last 1 year, currently compensated from CHF standpoint after initial Lasix which is now held, her shortness of breath is remarkably improved, defer any further diuresis to nephrology and cardiology who are following the patient, she is post considering right and left heart catheterization which shows 100% occluded RCA, 80-95% occlusion of OM2, severe pulmonary hypertension.  Continue beta blocker, will resume ACE inhibitor acute renal failure resolves.      3. ARF on Chronic kidney disease stage III. Baseline creatinine between 1.4 and 1.5. Likely due to IV contrast during left heart catheterization, withhold ACE/ARB, renal consulted.      4. 16 beat run of asymptomatic V. tach on 03/06/2013. Troponins trended are negative, she has been placed on better blocker, electrolytes including magnesium stable. Cardiology following.      5. Mild right upper quadrant abdominal pain. Liver enzymes  are stable - pain now resolved, no nausea, stable right upper quadrant ultrasound, HIDA scan and oral contrast CT scan abdomen pelvis.      6.DM-2 - A1c at 6.8, glycemic control is acceptable and GERD.   CBG (last 3)   Recent Labs  03/09/13 1147 03/09/13 1608 03/09/13 2107  GLUCAP 166* 126* 160*     Lab Results  Component Value Date   HGBA1C 6.8* 03/06/2013       7. Hypertension - stable on combination of nitrates, hydralazine and Lopressor. Will avoid excessive drop in blood pressure.      Code Status: Full  Family Communication: Discussed with her son in detail 03/09/2013 over the phone  Disposition Plan: Home   Procedures TTE, left heart cath, right upper quadrant ultrasound, HIDA scan   Consults  Cards, Renal   Medications  Scheduled Meds: . cefTRIAXone (ROCEPHIN)  IV  1 g Intravenous Q24H  . heparin subcutaneous  5,000 Units Subcutaneous Q8H  . hydrALAZINE  37.5 mg Oral Q8H  . insulin aspart  0-9 Units Subcutaneous TID WC  . isosorbide dinitrate  20 mg Oral BID  . metoprolol tartrate  50 mg Oral BID  . sodium chloride  3 mL Intravenous Q12H  . sodium chloride  3 mL Intravenous Q12H   Continuous Infusions: . sodium chloride 100 mL/hr at 03/10/13 0902   PRN Meds:.hydrALAZINE, HYDROcodone-acetaminophen, ondansetron, sodium chloride  DVT Prophylaxis    Heparin    Lab Results  Component Value Date   PLT 110*  03/10/2013    Antibiotics    Anti-infectives   Start     Dose/Rate Route Frequency Ordered Stop   03/09/13 1900  cefTRIAXone (ROCEPHIN) 1 g in dextrose 5 % 50 mL IVPB     1 g 100 mL/hr over 30 Minutes Intravenous Every 24 hours 03/09/13 1831            Subjective:   [name] [name] today has, No headache, No chest pain,   No Nausea, No new weakness tingling or numbness, No Cough - SOB. Mild RUQ abd pain.  Objective:   Filed Vitals:   03/09/13 1411 03/09/13 2156 03/10/13 0500 03/10/13 1011  BP: 147/67 132/70 147/65 160/84  Pulse:  54 54 57   Temp: 97.5 F (36.4 C)  97.9 F (36.6 C)   TempSrc: Oral  Oral   Resp: 18 20 20    Height:      Weight:   53.6 kg (118 lb 2.7 oz)   SpO2: 99%  97%     Wt Readings from Last 3 Encounters:  03/10/13 53.6 kg (118 lb 2.7 oz)  03/10/13 53.6 kg (118 lb 2.7 oz)  03/05/13 55.611 kg (122 lb 9.6 oz)     Intake/Output Summary (Last 24 hours) at 03/10/13 1120 Last data filed at 03/10/13 0900  Gross per 24 hour  Intake  552.5 ml  Output     75 ml  Net  477.5 ml    Exam Awake Alert, Oriented X 3, No new F.N deficits, Normal affect [place].AT,PERRAL Supple Neck,No JVD, No cervical lymphadenopathy appriciated.  Symmetrical Chest wall movement, Good air movement bilaterally, CTAB RRR,No Gallops,Rubs or new Murmurs, No Parasternal Heave +ve B.Sounds, Abd Soft, mild RUQ tenderness, No organomegaly appriciated, No rebound - guarding or rigidity. No Cyanosis, Clubbing or edema, No new Rash or bruise      Data Review   Micro Results No results found for this or any previous visit (from the past 240 hour(s)).  Radiology Reports Dg Chest 2 View  03/05/2013   CLINICAL DATA:  Bilateral pleural effusions and cardiomegaly. Cough without CHF.  EXAM: CHEST  2 VIEW  COMPARISON:  01/19/2013.  FINDINGS: Little change in the chest radiograph compared to prior. The cardiopericardial silhouette is enlarged and partially obscured by would appear to be bilateral subpulmonic pleural effusions. There is blunting of both costophrenic angles. The effusions may be loculated anteriorly. The pulmonary vascularity is mildly increased. Aortic arch atherosclerosis.  IMPRESSION: Cardiomegaly. Likely anteriorly loculated bilateral chronic pleural effusions, little changed from 01/19/2013.   Electronically Signed   By: Dereck Ligas M.D.   On: 03/05/2013 20:14   Dg Chest Right Decubitus  03/05/2013   CLINICAL DATA:  Evaluate effusions.  EXAM: CHEST - RIGHT DECUBITUS  COMPARISON:  Same day left-sided down  radiograph and PA and lateral radiographs of the chest.  FINDINGS: With the right side down, the right pleural effusion layers dependently. The majority of the left pleural effusion layers however there is a portion that persists along the periphery of the left chest wall.  IMPRESSION: Layering right pleural effusion.  Layering and partially loculated left pleural effusion along the left chest wall.   Electronically Signed   By: Carlos Levering M.D.   On: 03/05/2013 22:51   Dg Chest Left Decubitus  03/05/2013   CLINICAL DATA:  Evaluate effusions  EXAM: CHEST - LEFT DECUBITUS  COMPARISON:  03/05/2013 chest radiograph  FINDINGS: Left side down radiograph shows the pleural effusions to layer dependently. No appreciable  pneumothorax. Cardiac enlargement. Central vascular congestion. Perihilar consolidation on the right appear  IMPRESSION: Layering pleural effusions with left side down.  Right perihilar vascular congestion and consolidation; pneumonia or atelectasis. Recommend follow up to document resolution.   Electronically Signed   By: Carlos Levering M.D.   On: 03/05/2013 22:49    CBC  Recent Labs Lab 03/06/13 0532 03/07/13 0405 03/08/13 0339 03/08/13 1335 03/09/13 0437 03/10/13 0516  WBC 5.5 5.1 3.7* 4.4 4.2 5.1  HGB 10.8* 10.6* 10.8* 11.6* 11.4* 10.8*  HCT 31.8* 31.8* 32.2* 34.6* 34.2* 32.0*  PLT 127* 104* 117* 125* 121* 110*  MCV 88.8 88.8 89.4 89.6 89.8 89.4  MCH 30.2 29.6 30.0 30.1 29.9 30.2  MCHC 34.0 33.3 33.5 33.5 33.3 33.8  RDW 16.4* 16.8* 17.1* 17.1* 17.6* 17.4*  LYMPHSABS 1.5 1.3 1.1  --  0.9 1.2  MONOABS 0.4 0.7 0.3  --  0.5 0.6  EOSABS 1.3* 1.4* 0.0  --  0.1 0.1  BASOSABS 0.1 0.1 0.1  --  0.0 0.0    Chemistries   Recent Labs Lab 03/06/13 0532 03/07/13 0405 03/08/13 0339 03/08/13 1335 03/09/13 0437 03/10/13 0516  NA 140 139 141  --  139 139  K 4.8 4.5 5.1  --  5.0 5.0  CL 105 104 103  --  103 103  CO2 23 24 22   --  22 21  GLUCOSE 116* 110* 112*  --  152* 113*   BUN 38* 38* 39*  --  43* 47*  CREATININE 1.35* 1.44* 1.50* 1.57* 1.97* 2.67*  CALCIUM 8.4 8.4 8.5  --  8.3* 8.1*  MG  --  2.9*  --   --   --   --   AST 20  --  23  --  22 25  ALT 13  --  13  --  13 13  ALKPHOS 87  --  82  --  75 70  BILITOT 0.4  --  0.5  --  0.5 0.4   ------------------------------------------------------------------------------------------------------------------ estimated creatinine clearance is 16.1 ml/min (by C-G formula based on Cr of 2.67). ------------------------------------------------------------------------------------------------------------------ No results found for this basename: HGBA1C,  in the last 72 hours ------------------------------------------------------------------------------------------------------------------ No results found for this basename: CHOL, HDL, LDLCALC, TRIG, CHOLHDL, LDLDIRECT,  in the last 72 hours ------------------------------------------------------------------------------------------------------------------ No results found for this basename: TSH, T4TOTAL, FREET3, T3FREE, THYROIDAB,  in the last 72 hours ------------------------------------------------------------------------------------------------------------------ No results found for this basename: VITAMINB12, FOLATE, FERRITIN, TIBC, IRON, RETICCTPCT,  in the last 72 hours  Coagulation profile  Recent Labs Lab 03/08/13 0339  INR 1.12    No results found for this basename: DDIMER,  in the last 72 hours  Cardiac Enzymes  Recent Labs Lab 03/06/13 0102 03/06/13 0805 03/06/13 1245  TROPONINI <0.30 <0.30 <0.30   ------------------------------------------------------------------------------------------------------------------ No components found with this basename: POCBNP,      Time Spent in minutes   35   Nikhita Mentzel K M.D on 03/10/2013 at 11:20 AM  Between 7am to 7pm - Pager - [phone]  After 7pm go to [email or web address] - password [email or web address]  And look for  the night coverage person covering for me after hours  [facility] Office  [phone]

## 2013-03-10 NOTE — Progress Notes (Signed)
Pt ambulated 200 ft in hall without distress.  Tolerated well. Paulo Fruit

## 2013-03-11 DIAGNOSIS — I472 Ventricular tachycardia: Secondary | ICD-10-CM | POA: Diagnosis not present

## 2013-03-11 DIAGNOSIS — I255 Ischemic cardiomyopathy: Secondary | ICD-10-CM | POA: Diagnosis present

## 2013-03-11 DIAGNOSIS — I272 Pulmonary hypertension, unspecified: Secondary | ICD-10-CM | POA: Diagnosis present

## 2013-03-11 DIAGNOSIS — I4729 Other ventricular tachycardia: Secondary | ICD-10-CM | POA: Diagnosis not present

## 2013-03-11 DIAGNOSIS — I2589 Other forms of chronic ischemic heart disease: Secondary | ICD-10-CM

## 2013-03-11 LAB — GLUCOSE, CAPILLARY
GLUCOSE-CAPILLARY: 137 mg/dL — AB (ref 70–99)
Glucose-Capillary: 134 mg/dL — ABNORMAL HIGH (ref 70–99)
Glucose-Capillary: 185 mg/dL — ABNORMAL HIGH (ref 70–99)
Glucose-Capillary: 160 mg/dL — ABNORMAL HIGH (ref 70–99)
Glucose-Capillary: 149 mg/dL — ABNORMAL HIGH (ref 70–99)

## 2013-03-11 LAB — BASIC METABOLIC PANEL
BUN: 46 mg/dL — ABNORMAL HIGH (ref 6–23)
CALCIUM: 8.1 mg/dL — AB (ref 8.4–10.5)
CO2: 21 mEq/L (ref 19–32)
CREATININE: 3.08 mg/dL — AB (ref 0.50–1.10)
Chloride: 100 mEq/L (ref 96–112)
GFR calc non Af Amer: 15 mL/min — ABNORMAL LOW (ref >90)
GFR, EST AFRICAN AMERICAN: 17 mL/min — AB (ref >90)
GLUCOSE: 131 mg/dL — AB (ref 70–99)
POTASSIUM: 4.2 meq/L (ref 3.7–5.3)
Sodium: 135 mEq/L — ABNORMAL LOW (ref 137–147)

## 2013-03-11 LAB — CBC WITH DIFFERENTIAL/PLATELET
BASOS PCT: 1 % (ref 0–1)
Basophils Absolute: 0 10*3/uL (ref 0.0–0.1)
EOS PCT: 2 % (ref 0–5)
Eosinophils Absolute: 0.1 10*3/uL (ref 0.0–0.7)
HCT: 32.8 % — ABNORMAL LOW (ref 36.0–46.0)
HEMOGLOBIN: 10.9 g/dL — AB (ref 12.0–15.0)
LYMPHS ABS: 1.1 10*3/uL (ref 0.7–4.0)
Lymphocytes Relative: 19 % (ref 12–46)
MCH: 29.8 pg (ref 26.0–34.0)
MCHC: 33.2 g/dL (ref 30.0–36.0)
MCV: 89.6 fL (ref 78.0–100.0)
MONO ABS: 0.6 10*3/uL (ref 0.1–1.0)
MONOS PCT: 11 % (ref 3–12)
NEUTROS PCT: 68 % (ref 43–77)
Neutro Abs: 3.9 10*3/uL (ref 1.7–7.7)
Platelets: 121 10*3/uL — ABNORMAL LOW (ref 150–400)
RBC: 3.66 MIL/uL — AB (ref 3.87–5.11)
RDW: 17.6 % — ABNORMAL HIGH (ref 11.5–15.5)
WBC: 5.8 10*3/uL (ref 4.0–10.5)

## 2013-03-11 MED ORDER — ISOSORBIDE DINITRATE 20 MG PO TABS
20.0000 mg | ORAL_TABLET | Freq: Three times a day (TID) | ORAL | Status: DC
  Administered 2013-03-11 – 2013-03-14 (×11): 20 mg via ORAL
  Filled 2013-03-11 (×15): qty 1

## 2013-03-11 NOTE — Progress Notes (Signed)
Patient Demographics  [name] [name], is a 63 y.o. female, DOB - 10-21-1950, AVW:[id number]  Admit date - 03/05/2013   Admitting Physician Shanda Howells, MD  Outpatient Primary MD for the patient is No PCP Per Patient  LOS - 6   Chief Complaint  Patient presents with  . Shortness of Breath        Assessment & Plan    1.SOB due to Pl effusions and acute on chronic systolic and diastolic CHF - likely transudative due to underlying CHF, improved with diuresis which has been stopped on 03/07/2013, further fluid management per nephrology and cardiology, question of loculated effusion for which pulmonary is already following the patient.      2. Acute on chronic systolic and diastolic CHF exacerbation with severe mitral valve stenosis noted on TTE done this admission. Patient's EF has dropped considerably in the last 1 year, currently compensated from CHF standpoint after initial Lasix which is now held, her shortness of breath is remarkably improved, defer any further diuresis to nephrology and cardiology who are following the patient, she is post considering right and left heart catheterization which shows 100% occluded RCA, 80-95% occlusion of OM2, severe pulmonary hypertension.  Continue beta blocker, will resume ACE inhibitor acute renal failure resolves.      3. ARF on Chronic kidney disease stage III. Baseline creatinine between 1.4 and 1.5. Likely due to IV contrast during left heart catheterization, withhold ACE/ARB, renal consulted, will defer fluid management/diuretic use on renal and cardiology.      4. 16 beat run of asymptomatic V. tach on 03/06/2013. Troponins trended are negative, she has been placed on better blocker, electrolytes including magnesium stable. Cardiology  following.      5. Mild right upper quadrant abdominal pain. Liver enzymes are stable - pain now resolved, no nausea, stable right upper quadrant ultrasound, HIDA scan and oral contrast CT scan abdomen pelvis.      6.DM-2 - A1c at 6.8, glycemic control is acceptable and GERD.   CBG (last 3)   Recent Labs  03/11/13 0548 03/11/13 0810 03/11/13 1115  GLUCAP 137* 134* 185*     Lab Results  Component Value Date   HGBA1C 6.8* 03/06/2013       7. Hypertension - stable on combination of nitrates, hydralazine and Lopressor. Will avoid excessive drop in blood pressure.      Code Status: Full  Family Communication: Discussed with her son in detail 03/09/2013 over the phone  Disposition Plan: Home   Procedures TTE, left heart cath, right upper quadrant ultrasound, HIDA scan   Consults  Cards, Renal   Medications  Scheduled Meds: . cefTRIAXone (ROCEPHIN)  IV  1 g Intravenous Q24H  . heparin subcutaneous  5,000 Units Subcutaneous Q8H  . hydrALAZINE  37.5 mg Oral Q8H  . insulin aspart  0-9 Units Subcutaneous TID WC  . isosorbide dinitrate  20 mg Oral TID  . metoprolol tartrate  50 mg Oral BID  . sodium chloride  3 mL Intravenous Q12H  . sodium chloride  3 mL Intravenous Q12H   Continuous Infusions:   PRN Meds:.hydrALAZINE, HYDROcodone-acetaminophen, ondansetron, sodium chloride  DVT Prophylaxis    Heparin    Lab Results  Component Value Date   PLT  121* 03/11/2013    Antibiotics    Anti-infectives   Start     Dose/Rate Route Frequency Ordered Stop   03/09/13 1900  cefTRIAXone (ROCEPHIN) 1 g in dextrose 5 % 50 mL IVPB     1 g 100 mL/hr over 30 Minutes Intravenous Every 24 hours 03/09/13 1831            Subjective:   [name] [name] today has, No headache, No chest pain,   No Nausea, No new weakness tingling or numbness, No Cough - SOB. Mild RUQ abd pain.  Objective:   Filed Vitals:   03/10/13 1423 03/10/13 2044 03/10/13 2145 03/11/13 0435  BP:  151/76 129/69 140/70 150/69  Pulse: 54 56 61 62  Temp: 97.5 F (36.4 C) 98 F (36.7 C)  98 F (36.7 C)  TempSrc: Oral Oral  Oral  Resp: 20 18  18   Height:      Weight:    54.5 kg (120 lb 2.4 oz)  SpO2: 97% 98%  97%    Wt Readings from Last 3 Encounters:  03/11/13 54.5 kg (120 lb 2.4 oz)  03/11/13 54.5 kg (120 lb 2.4 oz)  03/05/13 55.611 kg (122 lb 9.6 oz)     Intake/Output Summary (Last 24 hours) at 03/11/13 1206 Last data filed at 03/11/13 0400  Gross per 24 hour  Intake    360 ml  Output    950 ml  Net   -590 ml    Exam Awake Alert, Oriented X 3, No new F.N deficits, Normal affect [name] [name],PERRAL Supple Neck,No JVD, No cervical lymphadenopathy appriciated.  Symmetrical Chest wall movement, Good air movement bilaterally, CTAB RRR,No Gallops,Rubs or new Murmurs, No Parasternal Heave +ve B.Sounds, Abd Soft, mild RUQ tenderness, No organomegaly appriciated, No rebound - guarding or rigidity. No Cyanosis, Clubbing or edema, No new Rash or bruise      Data Review   Micro Results No results found for this or any previous visit (from the past 240 hour(s)).  Radiology Reports Dg Chest 2 View  03/05/2013   CLINICAL DATA:  Bilateral pleural effusions and cardiomegaly. Cough without CHF.  EXAM: CHEST  2 VIEW  COMPARISON:  01/19/2013.  FINDINGS: Little change in the chest radiograph compared to prior. The cardiopericardial silhouette is enlarged and partially obscured by would appear to be bilateral subpulmonic pleural effusions. There is blunting of both costophrenic angles. The effusions may be loculated anteriorly. The pulmonary vascularity is mildly increased. Aortic arch atherosclerosis.  IMPRESSION: Cardiomegaly. Likely anteriorly loculated bilateral chronic pleural effusions, little changed from 01/19/2013.   Electronically Signed   By: Dereck Ligas M.D.   On: 03/05/2013 20:14   Dg Chest Right Decubitus  03/05/2013   CLINICAL DATA:  Evaluate effusions.  EXAM: CHEST - RIGHT  DECUBITUS  COMPARISON:  Same day left-sided down radiograph and PA and lateral radiographs of the chest.  FINDINGS: With the right side down, the right pleural effusion layers dependently. The majority of the left pleural effusion layers however there is a portion that persists along the periphery of the left chest wall.  IMPRESSION: Layering right pleural effusion.  Layering and partially loculated left pleural effusion along the left chest wall.   Electronically Signed   By: Carlos Levering M.D.   On: 03/05/2013 22:51   Dg Chest Left Decubitus  03/05/2013   CLINICAL DATA:  Evaluate effusions  EXAM: CHEST - LEFT DECUBITUS  COMPARISON:  03/05/2013 chest radiograph  FINDINGS: Left side down radiograph shows the pleural  effusions to layer dependently. No appreciable pneumothorax. Cardiac enlargement. Central vascular congestion. Perihilar consolidation on the right appear  IMPRESSION: Layering pleural effusions with left side down.  Right perihilar vascular congestion and consolidation; pneumonia or atelectasis. Recommend follow up to document resolution.   Electronically Signed   By: Carlos Levering M.D.   On: 03/05/2013 22:49    CBC  Recent Labs Lab 03/07/13 0405 03/08/13 0339 03/08/13 1335 03/09/13 0437 03/10/13 0516 03/11/13 0544  WBC 5.1 3.7* 4.4 4.2 5.1 5.8  HGB 10.6* 10.8* 11.6* 11.4* 10.8* 10.9*  HCT 31.8* 32.2* 34.6* 34.2* 32.0* 32.8*  PLT 104* 117* 125* 121* 110* 121*  MCV 88.8 89.4 89.6 89.8 89.4 89.6  MCH 29.6 30.0 30.1 29.9 30.2 29.8  MCHC 33.3 33.5 33.5 33.3 33.8 33.2  RDW 16.8* 17.1* 17.1* 17.6* 17.4* 17.6*  LYMPHSABS 1.3 1.1  --  0.9 1.2 1.1  MONOABS 0.7 0.3  --  0.5 0.6 0.6  EOSABS 1.4* 0.0  --  0.1 0.1 0.1  BASOSABS 0.1 0.1  --  0.0 0.0 0.0    Chemistries   Recent Labs Lab 03/06/13 0532 03/07/13 0405 03/08/13 0339 03/08/13 1335 03/09/13 0437 03/10/13 0516 03/11/13 0544  NA 140 139 141  --  139 139 135*  K 4.8 4.5 5.1  --  5.0 5.0 4.2  CL 105 104 103  --   103 103 100  CO2 23 24 22   --  22 21 21   GLUCOSE 116* 110* 112*  --  152* 113* 131*  BUN 38* 38* 39*  --  43* 47* 46*  CREATININE 1.35* 1.44* 1.50* 1.57* 1.97* 2.67* 3.08*  CALCIUM 8.4 8.4 8.5  --  8.3* 8.1* 8.1*  MG  --  2.9*  --   --   --   --   --   AST 20  --  23  --  22 25  --   ALT 13  --  13  --  13 13  --   ALKPHOS 87  --  82  --  75 70  --   BILITOT 0.4  --  0.5  --  0.5 0.4  --    ------------------------------------------------------------------------------------------------------------------ estimated creatinine clearance is 14.1 ml/min (by C-G formula based on Cr of 3.08). ------------------------------------------------------------------------------------------------------------------ No results found for this basename: HGBA1C,  in the last 72 hours ------------------------------------------------------------------------------------------------------------------ No results found for this basename: CHOL, HDL, LDLCALC, TRIG, CHOLHDL, LDLDIRECT,  in the last 72 hours ------------------------------------------------------------------------------------------------------------------ No results found for this basename: TSH, T4TOTAL, FREET3, T3FREE, THYROIDAB,  in the last 72 hours ------------------------------------------------------------------------------------------------------------------ No results found for this basename: VITAMINB12, FOLATE, FERRITIN, TIBC, IRON, RETICCTPCT,  in the last 72 hours  Coagulation profile  Recent Labs Lab 03/08/13 0339  INR 1.12    No results found for this basename: DDIMER,  in the last 72 hours  Cardiac Enzymes  Recent Labs Lab 03/06/13 0102 03/06/13 0805 03/06/13 1245  TROPONINI <0.30 <0.30 <0.30   ------------------------------------------------------------------------------------------------------------------ No components found with this basename: POCBNP,      Time Spent in minutes   35   SINGH,PRASHANT K M.D on  03/11/2013 at 12:06 PM  Between 7am to 7pm - Pager - [phone]  After 7pm go to [email or web address] - password [email or web address]  And look for the night coverage person covering for me after hours  [facility] Office  [phone]

## 2013-03-11 NOTE — Progress Notes (Signed)
Pt ambulated 200 ft in hall. Tolerated well. No distress.

## 2013-03-11 NOTE — Progress Notes (Signed)
Subjective:  Sitting up in bed, denies chest pain or SOB  Objective:  Vital Signs in the last 24 hours: Temp:  [97.5 F (36.4 C)-98 F (36.7 C)] 98 F (36.7 C) (02/02 0435) Pulse Rate:  [54-62] 62 (02/02 0435) Resp:  [18-20] 18 (02/02 0435) BP: (129-160)/(69-90) 150/69 mmHg (02/02 0435) SpO2:  [97 %-98 %] 97 % (02/02 0435) Weight:  [120 lb 2.4 oz (54.5 kg)] 120 lb 2.4 oz (54.5 kg) (02/02 0435)  Intake/Output from previous day:  Intake/Output Summary (Last 24 hours) at 03/11/13 0944 Last data filed at 03/11/13 0400  Gross per 24 hour  Intake    360 ml  Output    950 ml  Net   -590 ml    Physical Exam: General appearance: alert, cooperative and no distress Lungs: clear to auscultation bilaterally Heart: regular rate and rhythm   Rate: 60  Rhythm: normal sinus rhythm and sinus bradycardia  Lab Results:  Recent Labs  03/10/13 0516 03/11/13 0544  WBC 5.1 5.8  HGB 10.8* 10.9*  PLT 110* 121*    Recent Labs  03/10/13 0516 03/11/13 0544  NA 139 135*  K 5.0 4.2  CL 103 100  CO2 21 21  GLUCOSE 113* 131*  BUN 47* 46*  CREATININE 2.67* 3.08*    Imaging: Imaging results have been reviewed  Cardiac Studies:  Assessment/Plan:   Active Problems:   Acute CHF   AKI (acute kidney injury)   CAD - severe 2V at cath 03/08/13   Diabetes mellitus, type 2   Cardiomyopathy, ischemic- EF 30-35% (new LVD)   NSVT (nonsustained ventricular tachycardia)- 16bts 1/28   Pulmonary hypertension- severe, PA 63 mmHg by echo   Anemia   Pleural effusion, bilateral  Scheduled Meds: . cefTRIAXone (ROCEPHIN)  IV  1 g Intravenous Q24H  . heparin subcutaneous  5,000 Units Subcutaneous Q8H  . hydrALAZINE  37.5 mg Oral Q8H  . insulin aspart  0-9 Units Subcutaneous TID WC  . isosorbide dinitrate  20 mg Oral TID  . metoprolol tartrate  50 mg Oral BID  . sodium chloride  3 mL Intravenous Q12H  . sodium chloride  3 mL Intravenous Q12H   Continuous Infusions:  PRN Meds:.hydrALAZINE,  HYDROcodone-acetaminophen, ondansetron, sodium chloride   PLAN: SCr up to 3 today, continue to follow.  Increase Isordil to TID give with Apresoline.  EF at 35%  Timmothy Sours not think life vest is needed but should have outpatient f/u echo/MRI in 3 months.  Will arrange f/u with Dr Acie Fredrickson .     Jenkins Rouge    03/11/2013, 9:44 AM

## 2013-03-11 NOTE — Progress Notes (Signed)
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 rate 18, height 4' 11.06" (1.5 m), weight 54.5 kg (120 lb 2.4 oz), SpO2 97.00%. Lying in bed, flat, breathing comfortably RRR Lungs CTAB, w/o crackles No rashes/lesions ABD mildly TTP Trace LEE b/l Nonfocal Nl mood/affect NCAT   Assessment/Plan 1. 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  K and HCO3 ok.  Breathing well lying flat.   Minimal edema and clear lungs, hold diuretics today.   2. Systolic HF, pulm HTN, Ascites: as above and per cardiology 3. HTN: Stable 4. DM2: per [facility]  [name] MD 03/11/2013, 1:00 PM   Recent Labs Lab 03/09/13 0437 03/10/13 0516 03/11/13 0544  NA 139 139 135*  K 5.0 5.0 4.2  CL 103 103 100  CO2 22 21 21   GLUCOSE 152* 113* 131*  BUN 43* 47* 46*  CREATININE 1.97* 2.67* 3.08*  CALCIUM 8.3* 8.1* 8.1*    Recent Labs Lab 03/09/13 0437 03/10/13 0516 03/11/13 0544  WBC 4.2 5.1 5.8  NEUTROABS 2.7 3.1 3.9  HGB 11.4* 10.8* 10.9*  HCT 34.2* 32.0* 32.8*  MCV 89.8 89.4 89.6  PLT 121* 110* 121*

## 2013-03-12 ENCOUNTER — Inpatient Hospital Stay (HOSPITAL_COMMUNITY): Payer: Medicaid Other

## 2013-03-12 ENCOUNTER — Encounter (HOSPITAL_COMMUNITY): Payer: Self-pay | Admitting: Radiology

## 2013-03-12 DIAGNOSIS — R109 Unspecified abdominal pain: Secondary | ICD-10-CM

## 2013-03-12 LAB — HEPATIC FUNCTION PANEL
ALK PHOS: 71 U/L (ref 39–117)
ALT: 11 U/L (ref 0–35)
AST: 21 U/L (ref 0–37)
Albumin: 2.7 g/dL — ABNORMAL LOW (ref 3.5–5.2)
Total Bilirubin: 0.4 mg/dL (ref 0.3–1.2)
Total Protein: 6.9 g/dL (ref 6.0–8.3)

## 2013-03-12 LAB — GLUCOSE, CAPILLARY
GLUCOSE-CAPILLARY: 147 mg/dL — AB (ref 70–99)
Glucose-Capillary: 147 mg/dL — ABNORMAL HIGH (ref 70–99)
Glucose-Capillary: 154 mg/dL — ABNORMAL HIGH (ref 70–99)
Glucose-Capillary: 163 mg/dL — ABNORMAL HIGH (ref 70–99)
Glucose-Capillary: 144 mg/dL — ABNORMAL HIGH (ref 70–99)

## 2013-03-12 LAB — LIPASE, BLOOD: Lipase: 18 U/L (ref 11–59)

## 2013-03-12 LAB — BASIC METABOLIC PANEL
BUN: 42 mg/dL — AB (ref 6–23)
CO2: 17 mEq/L — ABNORMAL LOW (ref 19–32)
CREATININE: 2.74 mg/dL — AB (ref 0.50–1.10)
Calcium: 8.3 mg/dL — ABNORMAL LOW (ref 8.4–10.5)
Chloride: 102 mEq/L (ref 96–112)
GFR calc Af Amer: 20 mL/min — ABNORMAL LOW (ref >90)
GFR calc non Af Amer: 17 mL/min — ABNORMAL LOW (ref >90)
Glucose, Bld: 154 mg/dL — ABNORMAL HIGH (ref 70–99)
Potassium: 4 mEq/L (ref 3.7–5.3)
Sodium: 138 mEq/L (ref 137–147)

## 2013-03-12 LAB — LACTIC ACID, PLASMA: Lactic Acid, Venous: 1.1 mmol/L (ref 0.5–2.2)

## 2013-03-12 MED ORDER — MORPHINE SULFATE 2 MG/ML IJ SOLN
2.0000 mg | INTRAMUSCULAR | Status: DC | PRN
  Administered 2013-03-12 (×2): 2 mg via INTRAVENOUS
  Filled 2013-03-12 (×3): qty 1

## 2013-03-12 MED ORDER — SODIUM CHLORIDE 0.9 % IV SOLN: INTRAVENOUS | Status: AC

## 2013-03-12 MED ORDER — SODIUM CHLORIDE 0.9 % IV SOLN
8.0000 mg/h | INTRAVENOUS | Status: DC
  Administered 2013-03-12 – 2013-03-13 (×3): 8 mg/h via INTRAVENOUS
  Filled 2013-03-12 (×6): qty 80

## 2013-03-12 MED ORDER — SODIUM CHLORIDE 0.9 % IV SOLN
80.0000 mg | Freq: Once | INTRAVENOUS | Status: AC
  Administered 2013-03-12: 80 mg via INTRAVENOUS
  Filled 2013-03-12: qty 80

## 2013-03-12 MED ORDER — PANTOPRAZOLE SODIUM 40 MG IV SOLR
80.0000 mg | Freq: Once | INTRAVENOUS | Status: DC
  Filled 2013-03-12: qty 80

## 2013-03-12 MED ORDER — PANTOPRAZOLE SODIUM 40 MG IV SOLR
40.0000 mg | Freq: Two times a day (BID) | INTRAVENOUS | Status: DC
Start: 2013-03-15 — End: 2013-03-13

## 2013-03-12 MED ORDER — MORPHINE SULFATE 2 MG/ML IJ SOLN
INTRAMUSCULAR | Status: AC
  Filled 2013-03-12: qty 1

## 2013-03-12 MED ORDER — MORPHINE SULFATE 2 MG/ML IJ SOLN
1.0000 mg | INTRAMUSCULAR | Status: DC | PRN
  Administered 2013-03-12: 1 mg via INTRAVENOUS

## 2013-03-12 NOTE — Consult Note (Addendum)
I agree with above,no indication for surgery right now, agree with continued eval with gi, please call back if any further issues

## 2013-03-12 NOTE — Consult Note (Signed)
[facility] Gastroenterology Consultation Note  Referring Provider:  Dr. Lala Lund [facility]) Primary Care Physician:  No PCP Per Patient  Reason for Consultation:  Abdominal pain  HPI: [name] [name] is a 63 y.o. female whom we've been asked to see for evaluation of abdominal pain.  Patient's English is quite limited, requiring use of [name] language phone interpreter for evaluation.  Patient is unable to answer multiple questions consistently, despite multiple direct questions through the interpreter.  She tells me she started having epigastric and left-sided abdominal pain yesterday, and says it's constant and crampy in nature.  She endorses having some nausea and vomiting, perhaps worse after eating.  No blood in stool.  For the past month or two, she's had some constipation.  Unclear if she's had an endoscopy or colonoscopy.  Denies having abdominal pain before, despite the fact that she had CT scan, HIDA scan and constellation of other studies a few days ago for abdominal pain. After asking her the same question multiple times, she eventually said, yes, she was having abdominal pain a few days ago, but it was different and much less severe than her current pain.   Past Medical History  Diagnosis Date  . Diabetes mellitus     Reportedly diagnosed 2011 but no medication initiated until 04/2011 (Metformin)  . Hypertension   . Pneumonia   . Diabetic peripheral neuropathy     Since 2013  . CHF (congestive heart failure) 08/2012  . Anemia 08/2012  . UTI (urinary tract infection) 08/22/2012    Past Surgical History  Procedure Laterality Date  . Cesarean section      Prior to Admission medications   Medication Sig Start Date End Date Taking? Authorizing Provider  metFORMIN (GLUCOPHAGE) 500 MG tablet Take 250 mg by mouth 2 (two) times daily with a meal.    Yes Historical Provider, MD  OVER THE COUNTER MEDICATION Take 1 tablet by mouth 2 (two) times daily. One a day vitamin for diabetics   Yes  Historical Provider, MD  OVER THE COUNTER MEDICATION Take 15 mLs by mouth daily as needed (cough). Over the counter cough syrup   Yes Historical Provider, MD    Current Facility-Administered Medications  Medication Dose Route Frequency Provider Last Rate Last Dose  . 0.9 %  sodium chloride infusion   Intravenous Continuous Thurnell Lose, MD 50 mL/hr at 03/12/13 1108 50 mL/hr at 03/12/13 1108  . cefTRIAXone (ROCEPHIN) 1 g in dextrose 5 % 50 mL IVPB  1 g Intravenous Q24H Thurnell Lose, MD   1 g at 03/11/13 1958  . heparin injection 5,000 Units  5,000 Units Subcutaneous Q8H Dayna N Dunn, PA-C   5,000 Units at 03/12/13 0541  . hydrALAZINE (APRESOLINE) injection 20 mg  20 mg Intravenous Q6H PRN Leonie Man, MD      . hydrALAZINE (APRESOLINE) tablet 37.5 mg  37.5 mg Oral Q8H Mihai Croitoru, MD   37.5 mg at 03/12/13 0540  . HYDROcodone-acetaminophen (NORCO/VICODIN) 5-325 MG per tablet 1 tablet  1 tablet Oral Q6H PRN Thurnell Lose, MD   1 tablet at 03/12/13 0640  . insulin aspart (novoLOG) injection 0-9 Units  0-9 Units Subcutaneous TID WC Shanda Howells, MD   1 Units at 03/12/13 [phone]  . isosorbide dinitrate (ISORDIL) tablet 20 mg  20 mg Oral TID Erlene Quan, PA-C   20 mg at 03/12/13 1105  . metoprolol (LOPRESSOR) tablet 50 mg  50 mg Oral BID Thurnell Lose, MD  50 mg at 03/12/13 1105  . morphine 2 MG/ML injection 2 mg  2 mg Intravenous Q2H PRN Thurnell Lose, MD   2 mg at 03/12/13 1113  . ondansetron (ZOFRAN) injection 4 mg  4 mg Intravenous Q6H PRN Thurnell Lose, MD   4 mg at 03/12/13 0640  . pantoprazole (PROTONIX) 80 mg in sodium chloride 0.9 % 250 mL infusion  8 mg/hr Intravenous Continuous Thurnell Lose, MD 25 mL/hr at 03/12/13 0855 8 mg/hr at 03/12/13 0855  . [START ON 03/15/2013] pantoprazole (PROTONIX) injection 40 mg  40 mg Intravenous Q12H Thurnell Lose, MD      . sodium chloride 0.9 % injection 3 mL  3 mL Intravenous Q12H Shanda Howells, MD   3 mL at 03/11/13 2130  .  sodium chloride 0.9 % injection 3 mL  3 mL Intravenous Q12H Shanda Howells, MD   3 mL at 03/12/13 1106  . sodium chloride 0.9 % injection 3 mL  3 mL Intravenous PRN Shanda Howells, MD        Allergies as of 03/05/2013  . (No Known Allergies)    Family History  Problem Relation Age of Onset  . Diabetes Brother     deceased in 84Z dt DM complications    History   Social History  . Marital Status: Married    Spouse Name: N/A    Number of Children: N/A  . Years of Education: N/A   Occupational History  . Not on file.   Social History Main Topics  . Smoking status: Never Smoker   . Smokeless tobacco: Never Used  . Alcohol Use: No  . Drug Use: No  . Sexual Activity: Not on file   Other Topics Concern  . Not on file   Social History Narrative   Patient lives with husband and son. Currently without insurance and therefore tries not seek medical care dt financial concern.     Review of Systems: Unable to obtain meaningful ROS due to language barrier difficulties, despite use of language interpreter.  Physical Exam: Vital signs in last 24 hours: Temp:  [96 F (35.6 C)-98 F (36.7 C)] 97.5 F (36.4 C) (02/03 0452) Pulse Rate:  [57-65] 58 (02/03 0452) Resp:  [18-20] 20 (02/03 0452) BP: (162-166)/(68-90) 166/90 mmHg (02/03 0452) SpO2:  [97 %-99 %] 99 % (02/03 0452) Weight:  [55.7 kg (122 lb 12.7 oz)] 55.7 kg (122 lb 12.7 oz) (02/03 0452) Last BM Date: 03/09/13 General:   Alert,  Uncomfortable-appearing, groaning in bed Head:  Normocephalic and atraumatic. Eyes:  Sclera clear, no icterus.   Conjunctiva pink. Ears:  Normal auditory acuity. Nose:  No deformity, discharge,  or lesions. Mouth:  No deformity or lesions.  Oropharynx pink & moist. Neck:  Supple; no masses or thyromegaly. Lungs:  Clear throughout to auscultation.   No wheezes, crackles, or rhonchi. No acute distress. Heart:  Regular rate and rhythm; no murmurs, clicks, rubs,  or gallops. Abdomen:  Soft,  non-distended, active bowel sounds, mild epigastric and left periumbilical tenderness without peritonitis. No masses, hepatosplenomegaly or hernias noted. Normal bowel sounds, without guarding, and without rebound.     Msk:  Symmetrical without gross deformities. Normal posture. Pulses:  Normal pulses noted. Extremities:  Without clubbing or edema. Neurologic:  Alert and  oriented x4;  Diffusely weak, otherwise grossly normal neurologically. Skin:  Intact without significant lesions or rashes. Psych:  Alert and cooperative. Normal mood and affect.   Lab Results:  Recent Labs  03/10/13 0516 03/11/13 0544  WBC 5.1 5.8  HGB 10.8* 10.9*  HCT 32.0* 32.8*  PLT 110* 121*   BMET  Recent Labs  03/10/13 0516 03/11/13 0544 03/12/13 0352  NA 139 135* 138  K 5.0 4.2 4.0  CL 103 100 102  CO2 21 21 17*  GLUCOSE 113* 131* 154*  BUN 47* 46* 42*  CREATININE 2.67* 3.08* 2.74*  CALCIUM 8.1* 8.1* 8.3*   LFT  Recent Labs  03/10/13 0516  PROT 6.7  ALBUMIN 2.6*  AST 25  ALT 13  ALKPHOS 70  BILITOT 0.4   PT/INR No results found for this basename: LABPROT, INR,  in the last 72 hours  Studies/Results: US Renal  03/10/2013   CLINICAL DATA:  Acute on stage III chronic kidney disease  EXAM: RENAL/URINARY TRACT ULTRASOUND COMPLETE  COMPARISON:  CT abdomen and pelvis 03/09/2013  FINDINGS: Right Kidney:  Length: 9.7 cm. Normal cortical thickness. Increased cortical echogenicity. No mass, hydronephrosis or shadowing calcification. Minimal perinephric fluid upper pole. Mild ascites.  Left Kidney:  Length: 11.2 cm. Normal cortical thickness. Increased cortical echogenicity. No solid mass or hydronephrosis. Small cysts are identified, 15 x 10 x 12 mm at mid left kidney and 10 x 7 x 9 mm at inferior pole.  Bladder:  Contains minimal urine, decompressed by Foley catheter.  Ascites and bilateral pleural effusions noted.  IMPRESSION: Medical renal disease changes of both kidneys.  Small amount of ascites.   Bilateral pleural effusions.  No evidence of renal mass or hydronephrosis.   Electronically Signed   By: Lavonia Dana M.D.   On: 03/10/2013 11:56   Dg Abd Portable 1v  03/12/2013   CLINICAL DATA:  Mid abdominal discomfort  EXAM: PORTABLE ABDOMEN - 1 VIEW  COMPARISON:  US RENAL dated 03/10/2013; CT ABD/PELV WO CM dated 03/09/2013; DG ABD PORTABLE 1V dated 03/07/2013  FINDINGS: There is mild gaseous distention of the stomach. There are loops of mildly distended gas-filled small bowel in the lower abdomen. There is contrast within normal calibered loops of the colon from the CT scan of 09 March 2013. There are stable calcifications in the left upper quadrant of the abdomen. There are bilateral pleural effusions.  IMPRESSION: The bowel gas pattern is nonspecific. A mild ileus type pattern is suspected. The colon does not appear abnormally distended.   Electronically Signed   By: David  [place]   On: 03/12/2013 08:51   Impression:  1.  Abdominal pain.  Ill-defined.  Unclear etiology.  Despite extensive discussion with phone language interpreter, it is quite challenging to get patient to answer questions directly.  She appears uncomfortable, groaning, but her abdominal exam is benign-appearing.  Plan:  1.  CT abdomen/pelvis with oral contrast only, noting that she had unrevealing CT a few days ago, but tells me her pain is different and much more severe now. 2.  If CT is unrevealing, would do endoscopy and, possibly, abdominal ultrasound. 3.  Doubt utility of gastric emptying study, as regardless of result, doubt it will appreciably alter management. 4.  If all above tests are unrevealing, would consider laxatives for constipation and see if this helps her pain. 5.  Over 30 minutes spent in direct patient counseling and coordination of care. 6.  Thank you for consult.  Will follow.   LOS: 7 days   [place] Greb M  03/12/2013, 11:14 AM

## 2013-03-12 NOTE — Consult Note (Signed)
Reason for Consult:  Abdominal pain, possible ischemic colon Referring Physician: Dr. Lala Lund  [name] [name] is an 63 y.o. female.  HPI: 63 y/o admitted for progressive dyspnea, lower leg swelling and recurrent CHF.  BNP 21,512.  Film shows bilateral pleural effusions, creatinine was elevated, Troponin is <.30.  Dr. Candiss Norse notes some complaints of abd pain RUQ on 03/10/13, normal LFT's.HIDA SCAN 1/29, was normal.  CT scan, 03/09/13  Shows, moderate ascites in the abdomen and pelvis, decompressed stomach, and normal appearing small bowel, diverticula with a decompressed colon.  Generalized body wall edema/anasarca, large right pleural effusion, small left effusion.  She has undergone cardiac cath showing: Severe 2 Vessel CAD - 100% RCA mid with L-R collaterals; Diffuse 80-95% subtotal occlusion of OM2, Severe PA HTN 75-80/30 mmHg (Mean 45) mm Hg; PCWP ~ 30 mmHg, Systemic HTN - improved after IA/IC NTG plus IV Hydralazine, without notable improvement of LVEDP (~25-30 mmHg) & PCWP (~30). She has had some increased renal failure with cath, and is followed by Renal service.  Pt reports duration of symptoms is 2 days.  Location is epigastric region with radiation to left side. Pain she say is constant.  Last bowel movement was Sunday, diarrhea. She denies constipation prior to that, no BM during this admit.  Denies melena or hematochezia.  She reports dysuria.  No modifying factors.  No aggravating or alleviating factors.   Associated with malaise, anorexia, nausea and vomiting.  She reports throwing up today x2.  She reports daily episodes of emesis for the past 2 months.   At home she feels better after she eats in the morning, and thru the day.  Her symptoms are very vague, and information is thru the interpretor phone service She was seen today by Dr. Candiss Norse, and pt has more pain.  CT scan today without contrast:  1.Progressive anasarca with increasing diffuse soft tissue edema.  The pleural and pericardial  effusions and ascites have not  significantly changed in volume.  2. No specific evidence of bowel ischemia on non contrast imaging.  3. Persistent delayed nephrograms consistent with acute renal failure. The right kidney demonstrates multifocal scarring and non obstructive calyceal calculi. 4. Mildly worsened atelectasis at both lung bases. MRI:  There is 50% narrowing in the proximal SMA and this is not likely to be significant.  There is also narrowing at the origin of the celiac axis, however its origin was obscured. The IMA was obscured. It is either occluded or was not clearly visualized on this study. LFT'S are normal.  GI consult and Surgery consult obtained.    Past Medical History  Diagnosis Date  Diabetes mellitus   Diabetic peripheral neuropathy  Reportedly diagnosed 2011 but no medication initiated until 04/2011 (Metformin)      Hypertension  Pneumonia   CHF (congestive heart failure)  2D Echo 03/06/13:  ejection fraction was in the range of 30% to 35%. Diffuse hypokinesis. - Aortic valve: Moderate regurgitation. - Mitral valve: Mild regurgitation. - Right atrium: The atrium was mildly dilated. - Tricuspid valve: Moderate regurgitation. - Pulmonic valve: Moderate regurgitation. - Pulmonary arteries: Systolic pressure was moderately increased. PA peak pressure: 52m Hg (S).   08/2012  Anemia 08/2012  UTI (urinary tract infection) 08/22/2012    Past Surgical History  Procedure Laterality Date  . Cesarean section      Family History  Problem Relation Age of Onset  . Diabetes Brother     deceased in 702Hdt DM complications  Social History:  reports that she has never smoked. She has never used smokeless tobacco. She reports that she does not drink alcohol or use illicit drugs.  Allergies: No Known Allergies  Medications:  Prior to Admission:  Prescriptions prior to admission  Medication Sig Dispense Refill  . metFORMIN (GLUCOPHAGE) 500 MG tablet Take 250 mg  by mouth 2 (two) times daily with a meal.       . OVER THE COUNTER MEDICATION Take 1 tablet by mouth 2 (two) times daily. One a day vitamin for diabetics      . OVER THE COUNTER MEDICATION Take 15 mLs by mouth daily as needed (cough). Over the counter cough syrup       Scheduled: . cefTRIAXone (ROCEPHIN)  IV  1 g Intravenous Q24H  . heparin subcutaneous  5,000 Units Subcutaneous Q8H  . hydrALAZINE  37.5 mg Oral Q8H  . insulin aspart  0-9 Units Subcutaneous TID WC  . isosorbide dinitrate  20 mg Oral TID  . metoprolol tartrate  50 mg Oral BID  . [START ON 03/15/2013] pantoprazole (PROTONIX) IV  40 mg Intravenous Q12H  . sodium chloride  3 mL Intravenous Q12H  . sodium chloride  3 mL Intravenous Q12H   Continuous: . sodium chloride 50 mL/hr (03/12/13 1108)  . pantoprozole (PROTONIX) infusion 8 mg/hr (03/12/13 0855)   SVX:[id number], HYDROcodone-acetaminophen, morphine injection, ondansetron, sodium chloride Anti-infectives   Start     Dose/Rate Route Frequency Ordered Stop   03/09/13 1900  cefTRIAXone (ROCEPHIN) 1 g in dextrose 5 % 50 mL IVPB     1 g 100 mL/hr over 30 Minutes Intravenous Every 24 hours 03/09/13 1831        Results for orders placed during the hospital encounter of 03/05/13 (from the past 48 hour(s))  GLUCOSE, CAPILLARY     Status: Abnormal   Collection Time    03/10/13  4:15 PM      Result Value Range   Glucose-Capillary 173 (*) 70 - 99 mg/dL  URINALYSIS W MICROSCOPIC + REFLEX CULTURE     Status: Abnormal   Collection Time    03/10/13  8:30 PM      Result Value Range   Color, Urine YELLOW  YELLOW   APPearance CLEAR  CLEAR   Specific Gravity, Urine 1.021  1.005 - 1.030   pH 5.5  5.0 - 8.0   Glucose, UA NEGATIVE  NEGATIVE mg/dL   Hgb urine dipstick NEGATIVE  NEGATIVE   Bilirubin Urine NEGATIVE  NEGATIVE   Ketones, ur NEGATIVE  NEGATIVE mg/dL   Protein, ur 100 (*) NEGATIVE mg/dL   Urobilinogen, UA 0.2  0.0 - 1.0 mg/dL   Nitrite NEGATIVE  NEGATIVE    Leukocytes, UA NEGATIVE  NEGATIVE   WBC, UA 3-6  <3 WBC/hpf   RBC / HPF 0-2  <3 RBC/hpf   Bacteria, UA RARE  RARE   Squamous Epithelial / LPF RARE  RARE   Casts HYALINE CASTS (*) NEGATIVE   Urine-Other MUCOUS PRESENT    PROTEIN / CREATININE RATIO, URINE     Status: Abnormal   Collection Time    03/10/13  8:30 PM      Result Value Range   Creatinine, Urine 80.51     Total Protein, Urine 84     Comment: NO NORMAL RANGE ESTABLISHED FOR THIS TEST   PROTEIN CREATININE RATIO 1.04 (*) 0.00 - 0.15  SODIUM, URINE, RANDOM     Status: None   Collection Time  03/10/13  8:30 PM      Result Value Range   Sodium, Ur 59    CREATININE, URINE, RANDOM     Status: None   Collection Time    03/10/13  8:30 PM      Result Value Range   Creatinine, Urine 79.26    GLUCOSE, CAPILLARY     Status: Abnormal   Collection Time    03/10/13  8:43 PM      Result Value Range   Glucose-Capillary 117 (*) 70 - 99 mg/dL  CBC WITH DIFFERENTIAL     Status: Abnormal   Collection Time    03/11/13  5:44 AM      Result Value Range   WBC 5.8  4.0 - 10.5 K/uL   RBC 3.66 (*) 3.87 - 5.11 MIL/uL   Hemoglobin 10.9 (*) 12.0 - 15.0 g/dL   HCT 32.8 (*) 36.0 - 46.0 %   MCV 89.6  78.0 - 100.0 fL   MCH 29.8  26.0 - 34.0 pg   MCHC 33.2  30.0 - 36.0 g/dL   RDW 17.6 (*) 11.5 - 15.5 %   Platelets 121 (*) 150 - 400 K/uL   Neutrophils Relative % 68  43 - 77 %   Neutro Abs 3.9  1.7 - 7.7 K/uL   Lymphocytes Relative 19  12 - 46 %   Lymphs Abs 1.1  0.7 - 4.0 K/uL   Monocytes Relative 11  3 - 12 %   Monocytes Absolute 0.6  0.1 - 1.0 K/uL   Eosinophils Relative 2  0 - 5 %   Eosinophils Absolute 0.1  0.0 - 0.7 K/uL   Basophils Relative 1  0 - 1 %   Basophils Absolute 0.0  0.0 - 0.1 K/uL  BASIC METABOLIC PANEL     Status: Abnormal   Collection Time    03/11/13  5:44 AM      Result Value Range   Sodium 135 (*) 137 - 147 mEq/L   Potassium 4.2  3.7 - 5.3 mEq/L   Chloride 100  96 - 112 mEq/L   CO2 21  19 - 32 mEq/L   Glucose,  Bld 131 (*) 70 - 99 mg/dL   BUN 46 (*) 6 - 23 mg/dL   Creatinine, Ser 3.08 (*) 0.50 - 1.10 mg/dL   Calcium 8.1 (*) 8.4 - 10.5 mg/dL   GFR calc non Af Amer 15 (*) >90 mL/min   GFR calc Af Amer 17 (*) >90 mL/min   Comment: (NOTE)     The eGFR has been calculated using the CKD EPI equation.     This calculation has not been validated in all clinical situations.     eGFR's persistently <90 mL/min signify possible Chronic Kidney     Disease.  GLUCOSE, CAPILLARY     Status: Abnormal   Collection Time    03/11/13  5:48 AM      Result Value Range   Glucose-Capillary 137 (*) 70 - 99 mg/dL  GLUCOSE, CAPILLARY     Status: Abnormal   Collection Time    03/11/13  8:10 AM      Result Value Range   Glucose-Capillary 134 (*) 70 - 99 mg/dL  GLUCOSE, CAPILLARY     Status: Abnormal   Collection Time    03/11/13 11:15 AM      Result Value Range   Glucose-Capillary 185 (*) 70 - 99 mg/dL  GLUCOSE, CAPILLARY     Status: Abnormal   Collection Time  03/11/13  4:02 PM      Result Value Range   Glucose-Capillary 160 (*) 70 - 99 mg/dL  GLUCOSE, CAPILLARY     Status: Abnormal   Collection Time    03/11/13  9:25 PM      Result Value Range   Glucose-Capillary 149 (*) 70 - 99 mg/dL  BASIC METABOLIC PANEL     Status: Abnormal   Collection Time    03/12/13  3:52 AM      Result Value Range   Sodium 138  137 - 147 mEq/L   Potassium 4.0  3.7 - 5.3 mEq/L   Chloride 102  96 - 112 mEq/L   CO2 17 (*) 19 - 32 mEq/L   Glucose, Bld 154 (*) 70 - 99 mg/dL   BUN 42 (*) 6 - 23 mg/dL   Creatinine, Ser 2.74 (*) 0.50 - 1.10 mg/dL   Calcium 8.3 (*) 8.4 - 10.5 mg/dL   GFR calc non Af Amer 17 (*) >90 mL/min   GFR calc Af Amer 20 (*) >90 mL/min   Comment: (NOTE)     The eGFR has been calculated using the CKD EPI equation.     This calculation has not been validated in all clinical situations.     eGFR's persistently <90 mL/min signify possible Chronic Kidney     Disease.  GLUCOSE, CAPILLARY     Status: Abnormal    Collection Time    03/12/13  6:32 AM      Result Value Range   Glucose-Capillary 147 (*) 70 - 99 mg/dL  LIPASE, BLOOD     Status: None   Collection Time    03/12/13  8:17 AM      Result Value Range   Lipase 18  11 - 59 U/L  HEPATIC FUNCTION PANEL     Status: Abnormal   Collection Time    03/12/13  8:17 AM      Result Value Range   Total Protein 6.9  6.0 - 8.3 g/dL   Albumin 2.7 (*) 3.5 - 5.2 g/dL   AST 21  0 - 37 U/L   ALT 11  0 - 35 U/L   Alkaline Phosphatase 71  39 - 117 U/L   Total Bilirubin 0.4  0.3 - 1.2 mg/dL   Bilirubin, Direct <0.2  0.0 - 0.3 mg/dL   Indirect Bilirubin NOT CALCULATED  0.3 - 0.9 mg/dL  LACTIC ACID, PLASMA     Status: None   Collection Time    03/12/13 11:30 AM      Result Value Range   Lactic Acid, Venous 1.1  0.5 - 2.2 mmol/L  GLUCOSE, CAPILLARY     Status: Abnormal   Collection Time    03/12/13 11:39 AM      Result Value Range   Glucose-Capillary 154 (*) 70 - 99 mg/dL   Comment 1 Notify RN     Comment 2 Documented in Chart      Ct Abdomen Pelvis Wo Contrast  03/12/2013   CLINICAL DATA:  Abdominal pain. Ischemic colitis and acute renal failure.  EXAM: CT ABDOMEN AND PELVIS WITHOUT CONTRAST  TECHNIQUE: Multidetector CT imaging of the abdomen and pelvis was performed following the standard protocol without intravenous contrast.  COMPARISON:  DG ABD PORTABLE 1V dated 03/12/2013; US RENAL dated 03/10/2013; CT ABD/PELV WO CM dated 03/09/2013  FINDINGS: Right-greater-than-left pleural and spinal pericardial effusions are stable. There is worsening atelectasis at both lung bases. There is worsening generalized anasarca with diffuse subcutaneous  and intra-abdominal edema. Ascites has not significantly changed in volume.  The noncontrast appearance of the liver, spleen, pancreas and adrenal glands is stable. There is a small amount of residual contrast material within the gallbladder lumen. Both kidneys demonstrate persistent delayed nephrograms consistent with acute  renal failure. Multifocal renal cortical scarring is present on the right. There are probable small renal calyceal calculi on the right. There is no hydronephrosis.  There is enteric contrast material within the colon which is normal in caliber. There is no colonic wall thickening or pneumatosis. The stomach and small bowel demonstrate no significant findings. There is no extravasated enteric contrast. Aortoiliac atherosclerosis is noted. Vascular assessment is limited without contrast.  The urinary bladder is decompressed by a Foley catheter. Vascular calcifications are present within the uterus. There is no adnexal mass.  There are no worrisome osseous findings.  IMPRESSION: 1. Progressive anasarca with increasing diffuse soft tissue edema. The pleural and pericardial effusions and ascites have not significantly changed in volume. 2. No specific evidence of bowel ischemia on non contrast imaging. 3. Persistent delayed nephrograms consistent with acute renal failure. The right kidney demonstrates multifocal scarring and non obstructive calyceal calculi. 4. Mildly worsened atelectasis at both lung bases.   Electronically Signed   By: Camie Patience M.D.   On: 03/12/2013 12:42   Dg Abd Portable 1v  03/12/2013   CLINICAL DATA:  Mid abdominal discomfort  EXAM: PORTABLE ABDOMEN - 1 VIEW  COMPARISON:  US RENAL dated 03/10/2013; CT ABD/PELV WO CM dated 03/09/2013; DG ABD PORTABLE 1V dated 03/07/2013  FINDINGS: There is mild gaseous distention of the stomach. There are loops of mildly distended gas-filled small bowel in the lower abdomen. There is contrast within normal calibered loops of the colon from the CT scan of 09 March 2013. There are stable calcifications in the left upper quadrant of the abdomen. There are bilateral pleural effusions.  IMPRESSION: The bowel gas pattern is nonspecific. A mild ileus type pattern is suspected. The colon does not appear abnormally distended.   Electronically Signed   By: David  Martinique    On: 03/12/2013 08:51    Review of Systems  Constitutional: Positive for malaise/fatigue. Negative for fever, chills, weight loss and diaphoresis.  Respiratory: Positive for shortness of breath.   Cardiovascular: Positive for leg swelling. Negative for chest pain and palpitations.  Gastrointestinal: Positive for nausea, vomiting, abdominal pain and diarrhea. Negative for constipation, blood in stool and melena.  Genitourinary: Positive for dysuria. Negative for urgency, frequency, hematuria and flank pain.  Neurological: Negative for dizziness, loss of consciousness, weakness and headaches.   Blood pressure 166/90, pulse 58, temperature 97.5 F (36.4 C), temperature source Oral, resp. rate 20, height 4' 11.06" (1.5 m), weight 55.7 kg (122 lb 12.7 oz), SpO2 99.00%. Physical Exam  Constitutional: She is oriented to person, place, and time. She appears well-developed and well-nourished. No distress.  HENT:  Head: Normocephalic and atraumatic.  Eyes: Conjunctivae and EOM are normal. Right eye exhibits no discharge. Left eye exhibits no discharge. No scleral icterus.  Neck: Normal range of motion. Neck supple. No JVD present. No tracheal deviation present. No thyromegaly present.  Cardiovascular: Normal rate, regular rhythm and intact distal pulses.  Exam reveals no gallop.   No murmur heard. Respiratory: Effort normal and breath sounds normal. No respiratory distress. She has no wheezes. She has no rales. She exhibits no tenderness.  GI: Soft. Bowel sounds are normal. She exhibits no distension and no mass. There is  no tenderness. There is no rebound and no guarding.  Musculoskeletal: She exhibits edema (trace).  She has a trace edema now, but they were larger before admit  Lymphadenopathy:    She has no cervical adenopathy.  Neurological: She is alert and oriented to person, place, and time. No cranial nerve deficit.  Skin: Skin is warm and dry. No rash noted. She is not diaphoretic. No  erythema. No pallor.  Psychiatric: She has a normal mood and affect. Her behavior is normal. Judgment and thought content normal.    Assessment/Plan: 1.  Epigastric and LLQ abdominal pain,  2.  Admitted for dyspnea with: Recurrent CHF, with systolic and diastolic dysfunction. EF 30-35% on 2D  Echo, Mild MR and AR   2 Vessel CAD, Pulmonary hypertension. 3.  AODM with reported poor control, Insulin dependant/hx of neuropathy. 4.  Hypertension 5.  Acute on chronic Renal Failure 6.   Anemia   Plan:  On exam she does not have an acute abdomen, she is soft with good BS, she is not tender on palpation and pain is constant not affected by palpation.  I will review with Dr. Donne Hazel and follow, GI is seeing the pt also and may consider EGD, along with treatment for constipation.  [name] [name] 03/12/2013, 1:48 PM

## 2013-03-12 NOTE — Progress Notes (Signed)
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 rate 20, height 4' 11.06" (1.5 m), weight 55.7 kg (122 lb 12.7 oz), SpO2 99.00%. Lying in bed, flat, breathing comfortably RRR Lungs CTAB, w/o crackles No rashes/lesions ABD mildly TTP, soft, no r/g Trace LEE b/l Nonfocal Nl mood/affect NCAT  Assessment/Plan 1. 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  K and HCO3 ok.  Breathing well lying flat.   Minimal edema and clear lungs, hold diuretics again today given that she is now NPO.   2. Systolic HF, pulm HTN, Ascites: as above and per cardiology 3. HTN: Stable 4. DM2: per TRH 5.   N/V, abd pain: GI consult today.  On PPI gtt.   [name] MD 03/12/2013, 9:54 AM   Recent Labs Lab 03/10/13 0516 03/11/13 0544 03/12/13 0352  NA 139 135* 138  K 5.0 4.2 4.0  CL 103 100 102  CO2 21 21 17*  GLUCOSE 113* 131* 154*  BUN 47* 46* 42*  CREATININE 2.67* 3.08* 2.74*  CALCIUM 8.1* 8.1* 8.3*    Recent Labs Lab 03/09/13 0437 03/10/13 0516 03/11/13 0544  WBC 4.2 5.1 5.8  NEUTROABS 2.7 3.1 3.9  HGB 11.4* 10.8* 10.9*  HCT 34.2* 32.0* 32.8*  MCV 89.8 89.4 89.6  PLT 121* 110* 121*

## 2013-03-12 NOTE — Progress Notes (Signed)
Patient ID: [name] [name], female   DOB: 09/09/1950, 63 y.o.   MRN: [id number] Subjective:  Continues to have improved breathing Complains of LLQ abdominal pain   Objective:  Vital Signs in the last 24 hours: Temp:  [96 F (35.6 C)-98 F (36.7 C)] 97.5 F (36.4 C) (02/03 0452) Pulse Rate:  [57-65] 58 (02/03 0452) Resp:  [18-20] 20 (02/03 0452) BP: (162-166)/(68-90) 166/90 mmHg (02/03 0452) SpO2:  [97 %-99 %] 99 % (02/03 0452) Weight:  [122 lb 12.7 oz (55.7 kg)] 122 lb 12.7 oz (55.7 kg) (02/03 0452)  Intake/Output from previous day:  Intake/Output Summary (Last 24 hours) at 03/12/13 0904 Last data filed at 03/12/13 1497  Gross per 24 hour  Intake    240 ml  Output    875 ml  Net   -635 ml    Physical Exam: Affect appropriate Chronically ill appearing  HEENT: normal Neck supple with no adenopathy JVP normal no bruits no thyromegaly Lungs clear with no wheezing and good diaphragmatic motion Heart:  S1/S2 no murmur, no rub, gallop or click PMI normal Abdomen: soft tender mid epigastric area and LLQ no rebound  no bruit.  No HSM or HJR Distal pulses intact with no bruits No edema Neuro non-focal Skin warm and dry No muscular weakness    Rate: 60  Rhythm: normal sinus rhythm and sinus bradycardia  Lab Results:  Recent Labs  03/10/13 0516 03/11/13 0544  WBC 5.1 5.8  HGB 10.8* 10.9*  PLT 110* 121*    Recent Labs  03/11/13 0544 03/12/13 0352  NA 135* 138  K 4.2 4.0  CL 100 102  CO2 21 17*  GLUCOSE 131* 154*  BUN 46* 42*  CREATININE 3.08* 2.74*    Imaging: Imaging results have been reviewed  Cardiac Studies:  Assessment/Plan:   Active Problems:   Acute CHF   AKI (acute kidney injury)   Anemia   Diabetes mellitus, type 2   Pleural effusion, bilateral   CAD - severe 2V at cath 03/08/13   Cardiomyopathy, ischemic- EF 30-35% (new LVD)   NSVT (nonsustained ventricular tachycardia)- 16bts 1/28   Pulmonary hypertension- severe, PA 63 mmHg by  echo  Scheduled Meds: . cefTRIAXone (ROCEPHIN)  IV  1 g Intravenous Q24H  . heparin subcutaneous  5,000 Units Subcutaneous Q8H  . hydrALAZINE  37.5 mg Oral Q8H  . insulin aspart  0-9 Units Subcutaneous TID WC  . isosorbide dinitrate  20 mg Oral TID  . metoprolol tartrate  50 mg Oral BID  . pantoprazole (PROTONIX) IV  80 mg Intravenous Once  . [START ON 03/15/2013] pantoprazole (PROTONIX) IV  40 mg Intravenous Q12H  . sodium chloride  3 mL Intravenous Q12H  . sodium chloride  3 mL Intravenous Q12H   Continuous Infusions: . pantoprozole (PROTONIX) infusion 8 mg/hr (03/12/13 0855)   PRN Meds:.hydrALAZINE, HYDROcodone-acetaminophen, morphine injection, ondansetron, sodium chloride   PLAN: CR slightly improved today 2.74 , continue to follow.  Increase Isordil to  20 TID give with Apresoline.  EF at 35%  Timmothy Sours not think life vest is needed but should have outpatient f/u echo/MRI in 3 months.  Will arrange f/u with Dr Acie Fredrickson .  Getting KUB for abdominal pain Exam not acut  Iv protonix given   Jenkins Rouge    03/12/2013, 9:04 AM

## 2013-03-12 NOTE — Progress Notes (Addendum)
Patient Demographics  [name] [name], is a 63 y.o. female, DOB - Dec 01, 1950, [id number]  Admit date - 03/05/2013   Admitting Physician Shanda Howells, MD  Outpatient Primary MD for the patient is No PCP Per Patient  LOS - 7   Chief Complaint  Patient presents with  . Shortness of Breath      Brief summary  Is and 63 year old [name] with history of chronic combined systolic and diastolic CHF, pleural effusions, chronic kidney disease stage III with baseline creatinine of 1.3, was admitted to the hospital due to acute on chronic combined heart failure causing pleural effusion with some question of loculated effusion. Pulmonary was consulted was requested continue diuresis for now and no tach. Patient has also been seen by cardiology. While in the hospital patient underwent left heart cath after which he developed acute renal failure and renal team was involved, her kidney function is improving after diureses was held.   Her new complaint is now epigastric abdominal pain, she had some right upper quadrant abdominal pain 3 days ago after which he underwent CT scan abdomen pelvis, right upper quadrant ultrasound HIDA scan which were all unremarkable. For epigastric abdominal pain am suspicious that she might have gastritis, and made n.p.o. and placed on IV PPI. Have requested GI to see her urgently.  Assessment & Plan    1.SOB due to Pl effusions and acute on chronic systolic and diastolic CHF - likely transudative due to underlying CHF, improved with diuresis which has been stopped on 03/07/2013, further fluid management per nephrology and cardiology, question of loculated effusion for which pulmonary is already following the patient.      2. Acute on chronic systolic and diastolic CHF  exacerbation with severe mitral valve stenosis noted on TTE done this admission. Patient's EF has dropped considerably in the last 1 year, currently compensated from CHF standpoint after initial Lasix which is now held, her shortness of breath is remarkably improved, defer any further diuresis to nephrology and cardiology who are following the patient, she is post considering right and left heart catheterization which shows 100% occluded RCA, 80-95% occlusion of OM2, severe pulmonary hypertension.  She has been recommended to be placed on full medical treatment for CAD which will be continued, Continue beta blocker, will resume ACE inhibitor acute renal failure resolves.      3. ARF on Chronic kidney disease stage III. Baseline creatinine between 1.4 and 1.5. Likely due to IV contrast during left heart catheterization, withhold ACE/ARB, renal consulted, will defer fluid management/diuretic use on renal and cardiology. Currently Lasix and hold and renal function has improved mildly.      4. 16 beat run of asymptomatic V. tach on 03/06/2013. Troponins trended are negative, she has been placed on better blocker, electrolytes including magnesium stable. Cardiology following.      5. Epigastric abdominal pain. Acute on 03/12/2013, question gastritis, check lipase, since bicarbonate is low will check lactic acid, question ischemic colitis, placed on IV PPI, n.p.o., CCS - GI requested to see the patient urgently. We'll also order CT non-undressed abdomen pelvis to look for secondary signs of ischemia, also ordered MRA abdomen without contrast to look for mesenteric ischemia. Both test ordered a stat basis after discussions with  radiologist.   Previously she had right upper quadrant abdominal pain in her Liver enzymes were stable - pain now resolved, no nausea, stable right upper quadrant ultrasound, HIDA scan and oral contrast CT scan abdomen pelvis.      6.DM-2 - A1c at 6.8, glycemic control is  acceptable and GERD.   CBG (last 3)   Recent Labs  03/11/13 1602 03/11/13 2125 03/12/13 0632  GLUCAP 160* 149* 147*     Lab Results  Component Value Date   HGBA1C 6.8* 03/06/2013       7. Hypertension - stable on combination of nitrates, hydralazine and Lopressor. Will avoid excessive drop in blood pressure.      Code Status: Full  Family Communication: Discussed with her son in detail 03/09/2013 over the phone  Disposition Plan: Home   Procedures TTE, left heart cath, right upper quadrant ultrasound, HIDA scan   Consults  Cards, Renal   Medications  Scheduled Meds: . cefTRIAXone (ROCEPHIN)  IV  1 g Intravenous Q24H  . heparin subcutaneous  5,000 Units Subcutaneous Q8H  . hydrALAZINE  37.5 mg Oral Q8H  . insulin aspart  0-9 Units Subcutaneous TID WC  . isosorbide dinitrate  20 mg Oral TID  . metoprolol tartrate  50 mg Oral BID  . [START ON 03/15/2013] pantoprazole (PROTONIX) IV  40 mg Intravenous Q12H  . sodium chloride  3 mL Intravenous Q12H  . sodium chloride  3 mL Intravenous Q12H   Continuous Infusions: . pantoprozole (PROTONIX) infusion 8 mg/hr (03/12/13 0855)   PRN Meds:.hydrALAZINE, HYDROcodone-acetaminophen, morphine injection, ondansetron, sodium chloride  DVT Prophylaxis    Heparin    Lab Results  Component Value Date   PLT 121* 03/11/2013    Antibiotics    Anti-infectives   Start     Dose/Rate Route Frequency Ordered Stop   03/09/13 1900  cefTRIAXone (ROCEPHIN) 1 g in dextrose 5 % 50 mL IVPB     1 g 100 mL/hr over 30 Minutes Intravenous Every 24 hours 03/09/13 1831            Subjective:   [name] [name] today has, No headache, No chest pain,   No Nausea, No new weakness tingling or numbness, No Cough - SOB. Severe epigastric abdominal pain.  Objective:   Filed Vitals:   03/11/13 1358 03/11/13 2121 03/11/13 2125 03/12/13 0452  BP: 165/72  162/68 166/90  Pulse: 65 57  58  Temp: 98 F (36.7 C) 96 F (35.6 C)  97.5 F (36.4 C)    TempSrc: Oral Axillary  Oral  Resp: 18 19  20   Height:      Weight:    55.7 kg (122 lb 12.7 oz)  SpO2: 97% 99%  99%    Wt Readings from Last 3 Encounters:  03/12/13 55.7 kg (122 lb 12.7 oz)  03/12/13 55.7 kg (122 lb 12.7 oz)  03/05/13 55.611 kg (122 lb 9.6 oz)     Intake/Output Summary (Last 24 hours) at 03/12/13 1027 Last data filed at 03/12/13 2536  Gross per 24 hour  Intake    240 ml  Output    875 ml  Net   -635 ml    Exam Awake Alert, Oriented X 3, No new F.N deficits, Normal affect Media.AT,PERRAL Supple Neck,No JVD, No cervical lymphadenopathy appriciated.  Symmetrical Chest wall movement, Good air movement bilaterally, CTAB RRR,No Gallops,Rubs or new Murmurs, No Parasternal Heave +ve B.Sounds, Abd Soft, mild epigastric tenderness, No organomegaly appriciated, No rebound - guarding or rigidity.  No Cyanosis, Clubbing or edema, No new Rash or bruise      Data Review   Micro Results No results found for this or any previous visit (from the past 240 hour(s)).  Radiology Reports Dg Chest 2 View  03/05/2013   CLINICAL DATA:  Bilateral pleural effusions and cardiomegaly. Cough without CHF.  EXAM: CHEST  2 VIEW  COMPARISON:  01/19/2013.  FINDINGS: Little change in the chest radiograph compared to prior. The cardiopericardial silhouette is enlarged and partially obscured by would appear to be bilateral subpulmonic pleural effusions. There is blunting of both costophrenic angles. The effusions may be loculated anteriorly. The pulmonary vascularity is mildly increased. Aortic arch atherosclerosis.  IMPRESSION: Cardiomegaly. Likely anteriorly loculated bilateral chronic pleural effusions, little changed from 01/19/2013.   Electronically Signed   By: Dereck Ligas M.D.   On: 03/05/2013 20:14   Dg Chest Right Decubitus  03/05/2013   CLINICAL DATA:  Evaluate effusions.  EXAM: CHEST - RIGHT DECUBITUS  COMPARISON:  Same day left-sided down radiograph and PA and lateral radiographs of  the chest.  FINDINGS: With the right side down, the right pleural effusion layers dependently. The majority of the left pleural effusion layers however there is a portion that persists along the periphery of the left chest wall.  IMPRESSION: Layering right pleural effusion.  Layering and partially loculated left pleural effusion along the left chest wall.   Electronically Signed   By: Carlos Levering M.D.   On: 03/05/2013 22:51   Dg Chest Left Decubitus  03/05/2013   CLINICAL DATA:  Evaluate effusions  EXAM: CHEST - LEFT DECUBITUS  COMPARISON:  03/05/2013 chest radiograph  FINDINGS: Left side down radiograph shows the pleural effusions to layer dependently. No appreciable pneumothorax. Cardiac enlargement. Central vascular congestion. Perihilar consolidation on the right appear  IMPRESSION: Layering pleural effusions with left side down.  Right perihilar vascular congestion and consolidation; pneumonia or atelectasis. Recommend follow up to document resolution.   Electronically Signed   By: Carlos Levering M.D.   On: 03/05/2013 22:49    CBC  Recent Labs Lab 03/07/13 0405 03/08/13 0339 03/08/13 1335 03/09/13 0437 03/10/13 0516 03/11/13 0544  WBC 5.1 3.7* 4.4 4.2 5.1 5.8  HGB 10.6* 10.8* 11.6* 11.4* 10.8* 10.9*  HCT 31.8* 32.2* 34.6* 34.2* 32.0* 32.8*  PLT 104* 117* 125* 121* 110* 121*  MCV 88.8 89.4 89.6 89.8 89.4 89.6  MCH 29.6 30.0 30.1 29.9 30.2 29.8  MCHC 33.3 33.5 33.5 33.3 33.8 33.2  RDW 16.8* 17.1* 17.1* 17.6* 17.4* 17.6*  LYMPHSABS 1.3 1.1  --  0.9 1.2 1.1  MONOABS 0.7 0.3  --  0.5 0.6 0.6  EOSABS 1.4* 0.0  --  0.1 0.1 0.1  BASOSABS 0.1 0.1  --  0.0 0.0 0.0    Chemistries   Recent Labs Lab 03/06/13 0532 03/07/13 0405 03/08/13 0339 03/08/13 1335 03/09/13 0437 03/10/13 0516 03/11/13 0544 03/12/13 0352  NA 140 139 141  --  139 139 135* 138  K 4.8 4.5 5.1  --  5.0 5.0 4.2 4.0  CL 105 104 103  --  103 103 100 102  CO2 23 24 22   --  22 21 21  17*  GLUCOSE 116* 110*  112*  --  152* 113* 131* 154*  BUN 38* 38* 39*  --  43* 47* 46* 42*  CREATININE 1.35* 1.44* 1.50* 1.57* 1.97* 2.67* 3.08* 2.74*  CALCIUM 8.4 8.4 8.5  --  8.3* 8.1* 8.1* 8.3*  MG  --  2.9*  --   --   --   --   --   --   AST 20  --  23  --  22 25  --   --   ALT 13  --  13  --  13 13  --   --   ALKPHOS 87  --  82  --  75 70  --   --   BILITOT 0.4  --  0.5  --  0.5 0.4  --   --    ------------------------------------------------------------------------------------------------------------------ estimated creatinine clearance is 16 ml/min (by C-G formula based on Cr of 2.74). ------------------------------------------------------------------------------------------------------------------ No results found for this basename: HGBA1C,  in the last 72 hours ------------------------------------------------------------------------------------------------------------------ No results found for this basename: CHOL, HDL, LDLCALC, TRIG, CHOLHDL, LDLDIRECT,  in the last 72 hours ------------------------------------------------------------------------------------------------------------------ No results found for this basename: TSH, T4TOTAL, FREET3, T3FREE, THYROIDAB,  in the last 72 hours ------------------------------------------------------------------------------------------------------------------ No results found for this basename: VITAMINB12, FOLATE, FERRITIN, TIBC, IRON, RETICCTPCT,  in the last 72 hours  Coagulation profile  Recent Labs Lab 03/08/13 0339  INR 1.12    No results found for this basename: DDIMER,  in the last 72 hours  Cardiac Enzymes  Recent Labs Lab 03/06/13 0102 03/06/13 0805 03/06/13 1245  TROPONINI <0.30 <0.30 <0.30   ------------------------------------------------------------------------------------------------------------------ No components found with this basename: POCBNP,      Time Spent in minutes   35   Kristion Holifield K M.D on 03/12/2013 at 10:27  AM  Between 7am to 7pm - Pager - [phone]  After 7pm go to [email or web address] - password [email or web address]  And look for the night coverage person covering for me after hours  [facility] Office  [phone]

## 2013-03-13 ENCOUNTER — Encounter (HOSPITAL_COMMUNITY): Payer: Self-pay | Admitting: *Deleted

## 2013-03-13 ENCOUNTER — Encounter (HOSPITAL_COMMUNITY)
Admission: EM | Disposition: A | Discharge status: Home / Self Care | Payer: Medicaid Other | Source: Home / Self Care | Attending: Family Medicine

## 2013-03-13 ENCOUNTER — Inpatient Hospital Stay (HOSPITAL_COMMUNITY): Payer: Medicaid Other

## 2013-03-13 ENCOUNTER — Encounter (HOSPITAL_COMMUNITY)
Admission: EM | Disposition: A | Discharge status: Home / Self Care | Payer: Self-pay | Source: Home / Self Care | Attending: Family Medicine

## 2013-03-13 DIAGNOSIS — I5023 Acute on chronic systolic (congestive) heart failure: Secondary | ICD-10-CM

## 2013-03-13 DIAGNOSIS — D649 Anemia, unspecified: Secondary | ICD-10-CM

## 2013-03-13 HISTORY — PX: ESOPHAGOGASTRODUODENOSCOPY: SHX5428

## 2013-03-13 LAB — CBC
HEMATOCRIT: 33.8 % — AB (ref 36.0–46.0)
Hemoglobin: 11.2 g/dL — ABNORMAL LOW (ref 12.0–15.0)
MCH: 29.8 pg (ref 26.0–34.0)
MCHC: 33.1 g/dL (ref 30.0–36.0)
MCV: 89.9 fL (ref 78.0–100.0)
Platelets: 131 10*3/uL — ABNORMAL LOW (ref 150–400)
RBC: 3.76 MIL/uL — ABNORMAL LOW (ref 3.87–5.11)
RDW: 17.8 % — AB (ref 11.5–15.5)
WBC: 4.8 10*3/uL (ref 4.0–10.5)

## 2013-03-13 LAB — COMPREHENSIVE METABOLIC PANEL
ALBUMIN: 2.6 g/dL — AB (ref 3.5–5.2)
ALT: 9 U/L (ref 0–35)
AST: 14 U/L (ref 0–37)
Alkaline Phosphatase: 62 U/L (ref 39–117)
BUN: 42 mg/dL — AB (ref 6–23)
CO2: 18 mEq/L — ABNORMAL LOW (ref 19–32)
Calcium: 8.1 mg/dL — ABNORMAL LOW (ref 8.4–10.5)
Chloride: 104 mEq/L (ref 96–112)
Creatinine, Ser: 2.78 mg/dL — ABNORMAL HIGH (ref 0.50–1.10)
GFR calc Af Amer: 20 mL/min — ABNORMAL LOW (ref >90)
GFR calc non Af Amer: 17 mL/min — ABNORMAL LOW (ref >90)
Glucose, Bld: 132 mg/dL — ABNORMAL HIGH (ref 70–99)
Potassium: 4.2 mEq/L (ref 3.7–5.3)
SODIUM: 138 meq/L (ref 137–147)
TOTAL PROTEIN: 6.6 g/dL (ref 6.0–8.3)
Total Bilirubin: 0.3 mg/dL (ref 0.3–1.2)

## 2013-03-13 LAB — GLUCOSE, CAPILLARY
GLUCOSE-CAPILLARY: 132 mg/dL — AB (ref 70–99)
Glucose-Capillary: 139 mg/dL — ABNORMAL HIGH (ref 70–99)
Glucose-Capillary: 169 mg/dL — ABNORMAL HIGH (ref 70–99)
Glucose-Capillary: 136 mg/dL — ABNORMAL HIGH (ref 70–99)

## 2013-03-13 SURGERY — EGD (ESOPHAGOGASTRODUODENOSCOPY)
Anesthesia: Moderate Sedation

## 2013-03-13 MED ORDER — FENTANYL CITRATE 0.05 MG/ML IJ SOLN
INTRAMUSCULAR | Status: AC
  Filled 2013-03-13: qty 2

## 2013-03-13 MED ORDER — FENTANYL CITRATE 0.05 MG/ML IJ SOLN
INTRAMUSCULAR | Status: DC | PRN
  Administered 2013-03-13 (×2): 25 ug via INTRAVENOUS

## 2013-03-13 MED ORDER — SODIUM CHLORIDE 0.9 % IV SOLN
INTRAVENOUS | Status: DC
  Administered 2013-03-13: 10:00:00 via INTRAVENOUS

## 2013-03-13 MED ORDER — MILRINONE IN DEXTROSE 20 MG/100ML IV SOLN
0.1250 ug/kg/min | INTRAVENOUS | Status: DC
  Administered 2013-03-13 – 2013-03-15 (×3): 0.25 ug/kg/min via INTRAVENOUS
  Administered 2013-03-16: 0.125 ug/kg/min via INTRAVENOUS
  Filled 2013-03-13 (×4): qty 100

## 2013-03-13 MED ORDER — MIDAZOLAM HCL 10 MG/2ML IJ SOLN
INTRAMUSCULAR | Status: DC | PRN
  Administered 2013-03-13 (×2): 2 mg via INTRAVENOUS

## 2013-03-13 MED ORDER — PANTOPRAZOLE SODIUM 40 MG PO TBEC
40.0000 mg | DELAYED_RELEASE_TABLET | Freq: Two times a day (BID) | ORAL | Status: DC
  Administered 2013-03-14 – 2013-03-19 (×11): 40 mg via ORAL
  Filled 2013-03-13 (×12): qty 1

## 2013-03-13 MED ORDER — BUTAMBEN-TETRACAINE-BENZOCAINE 2-2-14 % EX AERO
INHALATION_SPRAY | CUTANEOUS | Status: DC | PRN
  Administered 2013-03-13: 2 via TOPICAL

## 2013-03-13 MED ORDER — FUROSEMIDE 10 MG/ML IJ SOLN
80.0000 mg | Freq: Two times a day (BID) | INTRAMUSCULAR | Status: DC
  Administered 2013-03-13 – 2013-03-15 (×5): 80 mg via INTRAVENOUS
  Filled 2013-03-13 (×8): qty 8

## 2013-03-13 MED ORDER — DIPHENHYDRAMINE HCL 50 MG/ML IJ SOLN
INTRAMUSCULAR | Status: AC
  Filled 2013-03-13: qty 1

## 2013-03-13 MED ORDER — SODIUM BICARBONATE 650 MG PO TABS
1300.0000 mg | ORAL_TABLET | Freq: Two times a day (BID) | ORAL | Status: DC
  Administered 2013-03-13 – 2013-03-16 (×7): 1300 mg via ORAL
  Filled 2013-03-13 (×11): qty 2

## 2013-03-13 MED ORDER — MIDAZOLAM HCL 5 MG/ML IJ SOLN
INTRAMUSCULAR | Status: AC
  Filled 2013-03-13: qty 2

## 2013-03-13 MED ORDER — SODIUM CHLORIDE 0.9 % IV SOLN: INTRAVENOUS | Status: DC

## 2013-03-13 NOTE — Op Note (Signed)
[name] [place], [id number]   ENDOSCOPY PROCEDURE REPORT  PATIENT: [name], [name]  MR#: [id number] BIRTHDATE: [birth year] , 73  yrs. old GENDER: Female ENDOSCOPIST:Janayah Zavada Oletta Lamas, MD REFERRED BY:  [facility] PROCEDURE DATE:  03/13/2013 PROCEDURE:      EGD ASA CLASS:     class III INDICATIONS:   abdominal pain CT and MRI failing to reveal calls. Patient does have an acidic fluid she said recent renal insufficiency and liver grossly normal  un contrasted CT MEDICATION:  fentanyl 50 mcg versed 4 mg IV TOPICAL ANESTHETIC:    cetacaine spray  DESCRIPTION OF PROCEDURE:   The Pentax adults in was inserted into the esophagus with swallowing. The esophagus was grossly normal with no ulceration or esophagitis. There was a moderate hiatal hernia. Notably, there was no signs of esophageal varices. The stomach was entered and examined in the forward and the retroflex view and was normal. Pyloric channel was normal and the duodenal bulb and 2nd duodenum worsening well and were normal. The scope is withdrawn from the patient tolerated procedure well. She had a brief period of low O2 saturation that came back to hundred percent with increasing her 02 flow rate. There were no immediate complications.     COMPLICATIONS: None  ENDOSCOPIC IMPRESSION: 1. Epigastric Abdominal Pain. No explanation found on EGD. There were no signs of esophageal varices.  RECOMMENDATIONS: 1. will place patient on heart healthy diet and follow her clinically    _______________________________ eSignedLaurence Spates, MD 03/13/2013 11:52 AM

## 2013-03-13 NOTE — Progress Notes (Signed)
Patient ID: [name] [name], female   DOB: [birth year], 63 y.o.   MRN: [id number] Subjective:  Continues to have abdominal pain   Objective:  Vital Signs in the last 24 hours: Temp:  [97.8 F (36.6 C)-98.1 F (36.7 C)] 98.1 F (36.7 C) (02/04 0440) Pulse Rate:  [55-61] 60 (02/04 0440) Resp:  [16-18] 18 (02/04 0440) BP: (159-188)/(81-91) 166/87 mmHg (02/04 0440) SpO2:  [96 %-97 %] 97 % (02/04 0440) Weight:  [126 lb 15.8 oz (57.6 kg)] 126 lb 15.8 oz (57.6 kg) (02/04 0440)  Intake/Output from previous day:  Intake/Output Summary (Last 24 hours) at 03/13/13 0912 Last data filed at 03/13/13 0909  Gross per 24 hour  Intake 1565.58 ml  Output    400 ml  Net 1165.58 ml    Physical Exam: Affect appropriate Chronically ill appearing  HEENT: normal Neck supple with no adenopathy JVP normal no bruits no thyromegaly Lungs clear with no wheezing and good diaphragmatic motion Heart:  S1/S2 no murmur, no rub, gallop or click PMI normal Abdomen: soft tender mid epigastric area and LLQ no rebound  no bruit.  No HSM or HJR Distal pulses intact with no bruits No edema Neuro non-focal Skin warm and dry No muscular weakness    Rate: 60  Rhythm: normal sinus rhythm and sinus bradycardia  Lab Results:  Recent Labs  03/11/13 0544 03/13/13 0250  WBC 5.8 4.8  HGB 10.9* 11.2*  PLT 121* 131*    Recent Labs  03/12/13 0352 03/13/13 0250  NA 138 138  K 4.0 4.2  CL 102 104  CO2 17* 18*  GLUCOSE 154* 132*  BUN 42* 42*  CREATININE 2.74* 2.78*    Imaging: Imaging results have been reviewed  Cardiac Studies:  Assessment/Plan:   Active Problems:   Acute CHF   AKI (acute kidney injury)   Anemia   Diabetes mellitus, type 2   Pleural effusion, bilateral   CAD - severe 2V at cath 03/08/13   Cardiomyopathy, ischemic- EF 30-35% (new LVD)   NSVT (nonsustained ventricular tachycardia)- 16bts 1/28   Pulmonary hypertension- severe, PA 63 mmHg by echo  Scheduled Meds: . cefTRIAXone  (ROCEPHIN)  IV  1 g Intravenous Q24H  . heparin subcutaneous  5,000 Units Subcutaneous Q8H  . hydrALAZINE  37.5 mg Oral Q8H  . insulin aspart  0-9 Units Subcutaneous TID WC  . isosorbide dinitrate  20 mg Oral TID  . metoprolol tartrate  50 mg Oral BID  . [START ON 03/15/2013] pantoprazole (PROTONIX) IV  40 mg Intravenous Q12H  . sodium chloride  3 mL Intravenous Q12H  . sodium chloride  3 mL Intravenous Q12H   Continuous Infusions: . sodium chloride Stopped (03/13/13 0910)  . sodium chloride    . sodium chloride    . pantoprozole (PROTONIX) infusion 8 mg/hr (03/12/13 2134)   PRN Meds:.hydrALAZINE, HYDROcodone-acetaminophen, morphine injection, ondansetron, sodium chloride   PLAN: RF persists with Cr 2.78  , continue to follow.  Increased Isordil to  20 TID and Apresoline for afterload reduction with EF at 35%  Timmothy Sours not think life vest is needed .  CT with anasarca and ascites.  Volume status complicated by renal failure Right heart numbers at cath a bit confusing but high with RA 15, PCWP 30 and EDP 15  Severe pulmonary hypertension.  Will ask CHF team to see if there  Is anything else to do to improve hemodynamics ? Milrinone   Jenkins Rouge    03/13/2013, 9:12 AM

## 2013-03-13 NOTE — Progress Notes (Signed)
Advanced Heart Failure Rounding Note   Subjective:    63 y/o [name] with h/o poorly controlled DM2, CKD and diastolic HF. Admitted 1/28 with recurrent HF.   Echo showed EF 30% with mod MR/TR. 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 Markedly elevated biventricular filling pressures on RHC with low normal index   RA 15 RV 80/8/30  (??) PA  75/36 (45) PCWP 30  LVEDP 31 Fick 3.8/2.6 Themo 3.0/2.2 PVR .9 WU  Started on IV lasix but weight continues to increase (113 -> 126lbs) and renal function worse (1.5->3.1->2.8).  Now with increasing fatigue and ab pain. EGD today normal.   CT abdomen: ascites and anasarca. Mild renal atrophy.  SBP 94-174  Objective:   Weight Range:  Vital Signs:   Temp:  [97.5 F (36.4 C)-98.1 F (36.7 C)] 97.5 F (36.4 C) (02/04 1500) Pulse Rate:  [49-63] 63 (02/04 1500) Resp:  [16-26] 20 (02/04 1500) BP: (97-198)/(46-88) 174/78 mmHg (02/04 1500) SpO2:  [95 %-100 %] 95 % (02/04 1500) Weight:  [57.6 kg (126 lb 15.8 oz)] 57.6 kg (126 lb 15.8 oz) (02/04 0440) Last BM Date: 03/09/13  Weight change: Filed Weights   03/11/13 0435 03/12/13 0452 03/13/13 0440  Weight: 54.5 kg (120 lb 2.4 oz) 55.7 kg (122 lb 12.7 oz) 57.6 kg (126 lb 15.8 oz)    Intake/Output:   Intake/Output Summary (Last 24 hours) at 03/13/13 2013 Last data filed at 03/13/13 1700  Gross per 24 hour  Intake 1057.5 ml  Output    400 ml  Net  657.5 ml     Physical Exam: General:  Fatigued ill-appearing. No resp difficulty HEENT: normal Neck: supple. JVP 9-10. Carotids 2+ bilat; no bruits. No lymphadenopathy or thryomegaly appreciated. Cor: PMI nondisplaced. Regular rate & rhythm. 2/6 MR 2/6 TR Lungs: clear. Decreased at bases Abdomen: soft, nontender, + distended. No hepatosplenomegaly. No bruits or masses. Good bowel sounds. Extremities: warm no cyanosis, clubbing, rash, tr-1+ edema Neuro: alert & orientedx3, cranial  nerves grossly intact. moves all 4 extremities w/o difficulty. Affect pleasant  Telemetry: SR  Labs: Basic Metabolic Panel:  Recent Labs Lab 03/07/13 0405  03/09/13 0437 03/10/13 0516 03/11/13 0544 03/12/13 0352 03/13/13 0250  NA 139  < > 139 139 135* 138 138  K 4.5  < > 5.0 5.0 4.2 4.0 4.2  CL 104  < > 103 103 100 102 104  CO2 24  < > 22 21 21  17* 18*  GLUCOSE 110*  < > 152* 113* 131* 154* 132*  BUN 38*  < > 43* 47* 46* 42* 42*  CREATININE 1.44*  < > 1.97* 2.67* 3.08* 2.74* 2.78*  CALCIUM 8.4  < > 8.3* 8.1* 8.1* 8.3* 8.1*  MG 2.9*  --   --   --   --   --   --   < > = values in this interval not displayed.  Liver Function Tests:  Recent Labs Lab 03/08/13 0339 03/09/13 0437 03/10/13 0516 03/12/13 0817 03/13/13 0250  AST 23 22 25 21 14   ALT 13 13 13 11 9   ALKPHOS 82 75 70 71 62  BILITOT 0.5 0.5 0.4 0.4 0.3  PROT 6.9 6.8 6.7 6.9 6.6  ALBUMIN 2.7* 2.6* 2.6* 2.7* 2.6*    Recent Labs Lab 03/07/13 0405 03/12/13 0817  LIPASE 44 18   No results found for this basename: AMMONIA,  in the last 168 hours  CBC:  Recent Labs Lab  03/07/13 0405 03/08/13 0339 03/08/13 1335 03/09/13 0437 03/10/13 0516 03/11/13 0544 03/13/13 0250  WBC 5.1 3.7* 4.4 4.2 5.1 5.8 4.8  NEUTROABS 1.6* 2.2  --  2.7 3.1 3.9  --   HGB 10.6* 10.8* 11.6* 11.4* 10.8* 10.9* 11.2*  HCT 31.8* 32.2* 34.6* 34.2* 32.0* 32.8* 33.8*  MCV 88.8 89.4 89.6 89.8 89.4 89.6 89.9  PLT 104* 117* 125* 121* 110* 121* 131*    Cardiac Enzymes: No results found for this basename: CKTOTAL, CKMB, CKMBINDEX, TROPONINI,  in the last 168 hours  BNP: BNP (last 3 results)  Recent Labs  08/22/12 0930 03/05/13 2204  PROBNP 6331.0* 21512.0*     Other results:    Imaging: Ct Abdomen Pelvis Wo Contrast  03/12/2013   CLINICAL DATA:  Abdominal pain. Ischemic colitis and acute renal failure.  EXAM: CT ABDOMEN AND PELVIS WITHOUT CONTRAST  TECHNIQUE: Multidetector CT imaging of the abdomen and pelvis was performed  following the standard protocol without intravenous contrast.  COMPARISON:  DG ABD PORTABLE 1V dated 03/12/2013; US RENAL dated 03/10/2013; CT ABD/PELV WO CM dated 03/09/2013  FINDINGS: Right-greater-than-left pleural and spinal pericardial effusions are stable. There is worsening atelectasis at both lung bases. There is worsening generalized anasarca with diffuse subcutaneous and intra-abdominal edema. Ascites has not significantly changed in volume.  The noncontrast appearance of the liver, spleen, pancreas and adrenal glands is stable. There is a small amount of residual contrast material within the gallbladder lumen. Both kidneys demonstrate persistent delayed nephrograms consistent with acute renal failure. Multifocal renal cortical scarring is present on the right. There are probable small renal calyceal calculi on the right. There is no hydronephrosis.  There is enteric contrast material within the colon which is normal in caliber. There is no colonic wall thickening or pneumatosis. The stomach and small bowel demonstrate no significant findings. There is no extravasated enteric contrast. Aortoiliac atherosclerosis is noted. Vascular assessment is limited without contrast.  The urinary bladder is decompressed by a Foley catheter. Vascular calcifications are present within the uterus. There is no adnexal mass.  There are no worrisome osseous findings.  IMPRESSION: 1. Progressive anasarca with increasing diffuse soft tissue edema. The pleural and pericardial effusions and ascites have not significantly changed in volume. 2. No specific evidence of bowel ischemia on non contrast imaging. 3. Persistent delayed nephrograms consistent with acute renal failure. The right kidney demonstrates multifocal scarring and non obstructive calyceal calculi. 4. Mildly worsened atelectasis at both lung bases.   Electronically Signed   By: Camie Patience M.D.   On: 03/12/2013 12:42   Mr Jodene Nam Abdomen Wo Contrast  03/12/2013   CLINICAL  DATA:  Mesenteric ischemia  EXAM: MRA ABDOMEN AND PELVIS WITHOUT CONTRAST  TECHNIQUE: Multiplanar, multiecho pulse sequences of the abdomen and pelvis were obtained WITHOUT intravenous contrast. Angiographic images of abdomen and pelvis were obtained using MRA technique WITHOUT intravenous contrast.  CONTRAST:  Noncontrast study  COMPARISON:  None.  FINDINGS: MRA ABDOMEN FINDINGS  This study is markedly limited secondary to patient motion.  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 Branch vessels of the celiac are grossly patent.  There is long segment 50% narrowing in the proximal SMA including its origin. The origin of the IMA is obscured. Branch vessels are not clearly visualized.  Single renal arteries are patent.  Aorta is non aneurysmal and patent.  Small bilateral pleural effusions are present. Diffuse anasarca is noted.  Free fluid in the abdomen is suspected.  IMPRESSION:  MRA ABDOMEN IMPRESSION  This study is limited secondary to patient motion.  There is 50% narrowing in the proximal SMA and this is not likely to be significant.  There is also narrowing at the origin of the celiac axis, however its origin was obscured.  The IMA was obscured. It is either occluded or was not clearly visualized on this study.   Electronically Signed   By: Maryclare Bean M.D.   On: 03/12/2013 14:50   US Abdomen Complete  03/13/2013   CLINICAL DATA:  Diabetes.  Hypertension.  EXAM: ULTRASOUND ABDOMEN COMPLETE  COMPARISON:  MR MRA ABD W/O CM dated 03/12/2013; CT ABD/PELV WO CM dated 03/12/2013  FINDINGS: Gallbladder:  No gallstones or wall thickening visualized. No sonographic Murphy sign noted.  Common bile duct:  Diameter: 3.2 mm.  Liver:  No focal lesion identified. Within normal limits in parenchymal echogenicity.  IVC:  No abnormality visualized.  Pancreas:  Visualized portion unremarkable.  Spleen:  Size and appearance within normal limits.  Right  Kidney:  Length: 8.6 cm. Right kidney is mildly echogenic and atrophic. 8 mm caliceal stone noted.  Left Kidney:  Length: 10.5 cm. Echogenicity within normal limits. No suggest mass or hydronephrosis visualized. 1.3 x 1.1 x 1.1 cm simple cyst. 0.8 by 0.6 x 0.7 cm simple cyst.  Abdominal aorta:  No aneurysm visualized.  Other findings:  Bilateral pleural effusions and ascites present.  IMPRESSION: 1. Mild right renal atrophy.  8 mm calyceal stone right kidney. 2. Bilateral pleural effusion and ascites. 3. No hepatobiliary disease noted.   Electronically Signed   By: Marcello Moores  Register   On: 03/13/2013 14:06   Dg Abd Portable 1v  03/12/2013   CLINICAL DATA:  Mid abdominal discomfort  EXAM: PORTABLE ABDOMEN - 1 VIEW  COMPARISON:  US RENAL dated 03/10/2013; CT ABD/PELV WO CM dated 03/09/2013; DG ABD PORTABLE 1V dated 03/07/2013  FINDINGS: There is mild gaseous distention of the stomach. There are loops of mildly distended gas-filled small bowel in the lower abdomen. There is contrast within normal calibered loops of the colon from the CT scan of 09 March 2013. There are stable calcifications in the left upper quadrant of the abdomen. There are bilateral pleural effusions.  IMPRESSION: The bowel gas pattern is nonspecific. A mild ileus type pattern is suspected. The colon does not appear abnormally distended.   Electronically Signed   By: David  Martinique   On: 03/12/2013 08:51      Medications:     Scheduled Medications: . cefTRIAXone (ROCEPHIN)  IV  1 g Intravenous Q24H  . furosemide  80 mg Intravenous BID  . heparin subcutaneous  5,000 Units Subcutaneous Q8H  . hydrALAZINE  37.5 mg Oral Q8H  . insulin aspart  0-9 Units Subcutaneous TID WC  . isosorbide dinitrate  20 mg Oral TID  . pantoprazole  40 mg Oral BID  . sodium bicarbonate  1,300 mg Oral BID  . sodium chloride  3 mL Intravenous Q12H  . sodium chloride  3 mL Intravenous Q12H     Infusions: . milrinone 0.25 mcg/kg/min (03/13/13 2011)      PRN Medications:  hydrALAZINE, HYDROcodone-acetaminophen, morphine injection, ondansetron, sodium chloride   Assessment:   1. Acute/chronic systolic HF with probable cardiogenic shock 2. A/c renal failure likely due to cardiorenal syndrome +/- contrast nephropathy 3. Ab pain due to low cardiac output 4. Anasarca 5. 2-V CAD 6. DM2   Plan/Discussion:  I suspect she has low-output physiology/cardiogenic shock. Will start milrinone and IV lasix. May benefit from paracentesis. If renal function worsens may even need short course CVVHD. Hold b-blocker, ACE/ARB.   May need to move to SDU and place central access to follow co-ox and CVP.   HF team will follow.   The patient is critically ill with multiple organ systems failure and requires high complexity decision making for assessment and support, frequent evaluation and titration of therapies, application of advanced monitoring technologies and extensive interpretation of multiple databases.   Critical Care Time devoted to patient care services described in this note is 45 Minutes.   Length of Stay: [place] 03/13/2013, 8:13 PM  Advanced Heart Failure Team Pager [phone] (M-F; 7a - 4p)  Please contact [facility] for night-coverage after hours (4p -7a ) and weekends on [email or web address]

## 2013-03-13 NOTE — Progress Notes (Signed)
[facility] PROGRESS NOTE  [name] [name] JJK:[id number] DOB: [birth year] DOA: 03/05/2013 PCP: No PCP Per Patient  Assessment/Plan:  Active Problems:   Acute CHF - Cardiology on board and managing.    AKI (acute kidney injury) - Nephrology on board and managing.    Diabetes mellitus, type 2 -Currently on SSI -Blood sugars relatively well controlled on this regimen    CAD - severe 2V at cath 03/08/13 -Cardiology on board   Mild right upper quadrant abdominal pain.  -Liver enzymes are stable - pain now resolved, no nausea, stable right upper quadrant ultrasound, HIDA scan and oral contrast CT scan abdomen pelvis. - GI consult at patient status post EGD with no findings to explain symptoms.  Code Status: full Family Communication: No family at bedside  Disposition Plan: Pending further evaluation recommendations from specialist on board   Consultants:  As listed above  Procedures:  EGD  Antibiotics:  Rocephin  HPI/Subjective: No acute issues reported overnight to me.  Objective: Filed Vitals:   03/13/13 1500  BP: 174/78  Pulse: 63  Temp: 97.5 F (36.4 C)  Resp: 20    Intake/Output Summary (Last 24 hours) at 03/13/13 1857 Last data filed at 03/13/13 1700  Gross per 24 hour  Intake 1057.5 ml  Output    400 ml  Net  657.5 ml   Filed Weights   03/11/13 0435 03/12/13 0452 03/13/13 0440  Weight: 54.5 kg (120 lb 2.4 oz) 55.7 kg (122 lb 12.7 oz) 57.6 kg (126 lb 15.8 oz)    Exam:   General:  Pt in NAD, Alert and awake  Cardiovascular: Normal S1 and S2, no murmurs  Respiratory: Breath sounds heard bilaterally, no wheezes, no increased work of breathing  Abdomen: Soft, nondistended  Musculoskeletal: No cyanosis or clubbing  Data Reviewed: Basic Metabolic Panel:  Recent Labs Lab 03/07/13 0405  03/09/13 0437 03/10/13 0516 03/11/13 0544 03/12/13 0352 03/13/13 0250  NA 139  < > 139 139 135* 138 138  K 4.5  < > 5.0 5.0 4.2 4.0 4.2  CL 104  < >  103 103 100 102 104  CO2 24  < > 22 21 21  17* 18*  GLUCOSE 110*  < > 152* 113* 131* 154* 132*  BUN 38*  < > 43* 47* 46* 42* 42*  CREATININE 1.44*  < > 1.97* 2.67* 3.08* 2.74* 2.78*  CALCIUM 8.4  < > 8.3* 8.1* 8.1* 8.3* 8.1*  MG 2.9*  --   --   --   --   --   --   < > = values in this interval not displayed. Liver Function Tests:  Recent Labs Lab 03/08/13 0339 03/09/13 0437 03/10/13 0516 03/12/13 0817 03/13/13 0250  AST 23 22 25 21 14   ALT 13 13 13 11 9   ALKPHOS 82 75 70 71 62  BILITOT 0.5 0.5 0.4 0.4 0.3  PROT 6.9 6.8 6.7 6.9 6.6  ALBUMIN 2.7* 2.6* 2.6* 2.7* 2.6*    Recent Labs Lab 03/07/13 0405 03/12/13 0817  LIPASE 44 18   No results found for this basename: AMMONIA,  in the last 168 hours CBC:  Recent Labs Lab 03/07/13 0405 03/08/13 0339 03/08/13 1335 03/09/13 0437 03/10/13 0516 03/11/13 0544 03/13/13 0250  WBC 5.1 3.7* 4.4 4.2 5.1 5.8 4.8  NEUTROABS 1.6* 2.2  --  2.7 3.1 3.9  --   HGB 10.6* 10.8* 11.6* 11.4* 10.8* 10.9* 11.2*  HCT 31.8* 32.2* 34.6* 34.2* 32.0* 32.8* 33.8*  MCV 88.8 89.4  89.6 89.8 89.4 89.6 89.9  PLT 104* 117* 125* 121* 110* 121* 131*   Cardiac Enzymes: No results found for this basename: CKTOTAL, CKMB, CKMBINDEX, TROPONINI,  in the last 168 hours BNP (last 3 results)  Recent Labs  08/22/12 0930 03/05/13 2204  PROBNP 6331.0* 21512.0*   CBG:  Recent Labs Lab 03/12/13 1613 03/12/13 2054 03/13/13 0616 03/13/13 1213 03/13/13 1751  GLUCAP 144* 147* 139* 132* 169*    No results found for this or any previous visit (from the past 240 hour(s)).   Studies: Ct Abdomen Pelvis Wo Contrast  03/12/2013   CLINICAL DATA:  Abdominal pain. Ischemic colitis and acute renal failure.  EXAM: CT ABDOMEN AND PELVIS WITHOUT CONTRAST  TECHNIQUE: Multidetector CT imaging of the abdomen and pelvis was performed following the standard protocol without intravenous contrast.  COMPARISON:  DG ABD PORTABLE 1V dated 03/12/2013; US RENAL dated 03/10/2013; CT  ABD/PELV WO CM dated 03/09/2013  FINDINGS: Right-greater-than-left pleural and spinal pericardial effusions are stable. There is worsening atelectasis at both lung bases. There is worsening generalized anasarca with diffuse subcutaneous and intra-abdominal edema. Ascites has not significantly changed in volume.  The noncontrast appearance of the liver, spleen, pancreas and adrenal glands is stable. There is a small amount of residual contrast material within the gallbladder lumen. Both kidneys demonstrate persistent delayed nephrograms consistent with acute renal failure. Multifocal renal cortical scarring is present on the right. There are probable small renal calyceal calculi on the right. There is no hydronephrosis.  There is enteric contrast material within the colon which is normal in caliber. There is no colonic wall thickening or pneumatosis. The stomach and small bowel demonstrate no significant findings. There is no extravasated enteric contrast. Aortoiliac atherosclerosis is noted. Vascular assessment is limited without contrast.  The urinary bladder is decompressed by a Foley catheter. Vascular calcifications are present within the uterus. There is no adnexal mass.  There are no worrisome osseous findings.  IMPRESSION: 1. Progressive anasarca with increasing diffuse soft tissue edema. The pleural and pericardial effusions and ascites have not significantly changed in volume. 2. No specific evidence of bowel ischemia on non contrast imaging. 3. Persistent delayed nephrograms consistent with acute renal failure. The right kidney demonstrates multifocal scarring and non obstructive calyceal calculi. 4. Mildly worsened atelectasis at both lung bases.   Electronically Signed   By: Camie Patience M.D.   On: 03/12/2013 12:42   Mr Jodene Nam Abdomen Wo Contrast  03/12/2013   CLINICAL DATA:  Mesenteric ischemia  EXAM: MRA ABDOMEN AND PELVIS WITHOUT CONTRAST  TECHNIQUE: Multiplanar, multiecho pulse sequences of the abdomen  and pelvis were obtained WITHOUT intravenous contrast. Angiographic images of abdomen and pelvis were obtained using MRA technique WITHOUT intravenous contrast.  CONTRAST:  Noncontrast study  COMPARISON:  None.  FINDINGS: MRA ABDOMEN FINDINGS  This study is markedly limited secondary to patient motion.  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 Branch vessels of the celiac are grossly patent.  There is long segment 50% narrowing in the proximal SMA including its origin. The origin of the IMA is obscured. Branch vessels are not clearly visualized.  Single renal arteries are patent.  Aorta is non aneurysmal and patent.  Small bilateral pleural effusions are present. Diffuse anasarca is noted. Free fluid in the abdomen is suspected.  IMPRESSION:  MRA ABDOMEN IMPRESSION  This study is limited secondary to patient motion.  There is 50% narrowing  in the proximal SMA and this is not likely to be significant.  There is also narrowing at the origin of the celiac axis, however its origin was obscured.  The IMA was obscured. It is either occluded or was not clearly visualized on this study.   Electronically Signed   By: Maryclare Bean M.D.   On: 03/12/2013 14:50   US Abdomen Complete  03/13/2013   CLINICAL DATA:  Diabetes.  Hypertension.  EXAM: ULTRASOUND ABDOMEN COMPLETE  COMPARISON:  MR MRA ABD W/O CM dated 03/12/2013; CT ABD/PELV WO CM dated 03/12/2013  FINDINGS: Gallbladder:  No gallstones or wall thickening visualized. No sonographic Murphy sign noted.  Common bile duct:  Diameter: 3.2 mm.  Liver:  No focal lesion identified. Within normal limits in parenchymal echogenicity.  IVC:  No abnormality visualized.  Pancreas:  Visualized portion unremarkable.  Spleen:  Size and appearance within normal limits.  Right Kidney:  Length: 8.6 cm. Right kidney is mildly echogenic and atrophic. 8 mm caliceal stone noted.  Left Kidney:  Length: 10.5 cm.  Echogenicity within normal limits. No suggest mass or hydronephrosis visualized. 1.3 x 1.1 x 1.1 cm simple cyst. 0.8 by 0.6 x 0.7 cm simple cyst.  Abdominal aorta:  No aneurysm visualized.  Other findings:  Bilateral pleural effusions and ascites present.  IMPRESSION: 1. Mild right renal atrophy.  8 mm calyceal stone right kidney. 2. Bilateral pleural effusion and ascites. 3. No hepatobiliary disease noted.   Electronically Signed   By: Marcello Moores  Register   On: 03/13/2013 14:06   Dg Abd Portable 1v  03/12/2013   CLINICAL DATA:  Mid abdominal discomfort  EXAM: PORTABLE ABDOMEN - 1 VIEW  COMPARISON:  US RENAL dated 03/10/2013; CT ABD/PELV WO CM dated 03/09/2013; DG ABD PORTABLE 1V dated 03/07/2013  FINDINGS: There is mild gaseous distention of the stomach. There are loops of mildly distended gas-filled small bowel in the lower abdomen. There is contrast within normal calibered loops of the colon from the CT scan of 09 March 2013. There are stable calcifications in the left upper quadrant of the abdomen. There are bilateral pleural effusions.  IMPRESSION: The bowel gas pattern is nonspecific. A mild ileus type pattern is suspected. The colon does not appear abnormally distended.   Electronically Signed   By: David  Martinique   On: 03/12/2013 08:51    Scheduled Meds: . cefTRIAXone (ROCEPHIN)  IV  1 g Intravenous Q24H  . furosemide  80 mg Intravenous BID  . heparin subcutaneous  5,000 Units Subcutaneous Q8H  . hydrALAZINE  37.5 mg Oral Q8H  . insulin aspart  0-9 Units Subcutaneous TID WC  . isosorbide dinitrate  20 mg Oral TID  . pantoprazole  40 mg Oral BID  . sodium bicarbonate  1,300 mg Oral BID  . sodium chloride  3 mL Intravenous Q12H  . sodium chloride  3 mL Intravenous Q12H   Continuous Infusions: . milrinone      Time spent: > 35 minutes    Velvet Bathe  [facility] Pager [phone]. If 7PM-7AM, please contact night-coverage at [email or web address], password [facility] 03/13/2013, 6:57 PM  LOS: 8 days

## 2013-03-13 NOTE — Interval H&P Note (Signed)
History and Physical Interval Note:  03/13/2013 11:28 AM  [name] [name]  has presented today for surgery, with the diagnosis of abd pain  The various methods of treatment have been discussed with the patient and family. After consideration of risks, benefits and other options for treatment, the patient has consented to  Procedure(s): ESOPHAGOGASTRODUODENOSCOPY (EGD) (N/A) as a surgical intervention .  The patient's history has been reviewed, patient examined, no change in status, stable for surgery.  I have reviewed the patient's chart and labs.  Questions were answered to the patient's satisfaction.     Dia Donate JR,Ryelynn Guedea L

## 2013-03-13 NOTE — Progress Notes (Signed)
Discussed EGD with pt and son who interpreted  Will go ahead with EGD later today. Pt is agreeable.

## 2013-03-13 NOTE — Progress Notes (Signed)
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) in setting of systolic HF, pulm HTN, and cardiac catheterization 03/08/13  Subjective:   GI eval for abd pain yesterday EGD today, unremarkable Had some hydration, now off Furosemide restarted, 80 IV BID Weight up 3kg over past 48h   02/03 0701 - 02/04 0700 In: 528.1 [P.O.:120; I.V.:408.1] Out: 400 [Urine:400]  Filed Weights   03/11/13 0435 03/12/13 0452 03/13/13 0440  Weight: 54.5 kg (120 lb 2.4 oz) 55.7 kg (122 lb 12.7 oz) 57.6 kg (126 lb 15.8 oz)    Current meds: reviewed  Current Labs: reviewed    Physical Exam:  Blood pressure 137/52, pulse 51, temperature 98.1 F (36.7 C), temperature source Oral, resp. rate 16, height 4' 11.06" (1.5 m), weight 57.6 kg (126 lb 15.8 oz), SpO2 97.00%. Lying in bed, flat, breathing comfortably RRR Lungs CTAB, w/o crackles No rashes/lesions ABD mildly TTP, soft, no r/g Trace LEE b/l Nonfocal Nl mood/affect NCAT  Assessment/Plan 1. 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  K ok.  Mild acidosis and will start NaHCO3.  Breathing well lying flat.   Back on diuretics.  Follow along.   2. Systolic HF, pulm HTN, Ascites: as above and per cardiology 3. HTN: Stable 4. DM2: per TR[facility] 5.   N/V, abd pain: GI consult today.  On PPI gtt.   [name] Grippe MD 03/13/2013, 12:43 PM   Recent Labs Lab 03/11/13 0544 03/12/13 0352 03/13/13 0250  NA 135* 138 138  K 4.2 4.0 4.2  CL 100 102 104  CO2 21 17* 18*  GLUCOSE 131* 154* 132*  BUN 46* 42* 42*  CREATININE 3.08* 2.74* 2.78*  CALCIUM 8.1* 8.3* 8.1*    Recent Labs Lab 03/09/13 0437 03/10/13 0516 03/11/13 0544 03/13/13 0250  WBC 4.2 5.1 5.8 4.8  NEUTROABS 2.7 3.1 3.9  --   HGB 11.4* 10.8* 10.9* 11.2*  HCT 34.2* 32.0* 32.8* 33.8*  MCV 89.8 89.4 89.6 89.9  PLT 121* 110* 121* 131*

## 2013-03-13 NOTE — H&P (View-Only) (Signed)
[facility] Gastroenterology Consultation Note  Referring Provider:  Dr. Lala Lund [facility]) Primary Care Physician:  No PCP Per Patient  Reason for Consultation:  Abdominal pain  HPI: [name] [name] is a 63 y.o. female whom we've been asked to see for evaluation of abdominal pain.  Patient's English is quite limited, requiring use of [name] language phone interpreter for evaluation.  Patient is unable to answer multiple questions consistently, despite multiple direct questions through the interpreter.  She tells me she started having epigastric and left-sided abdominal pain yesterday, and says it's constant and crampy in nature.  She endorses having some nausea and vomiting, perhaps worse after eating.  No blood in stool.  For the past month or two, she's had some constipation.  Unclear if she's had an endoscopy or colonoscopy.  Denies having abdominal pain before, despite the fact that she had CT scan, HIDA scan and constellation of other studies a few days ago for abdominal pain. After asking her the same question multiple times, she eventually said, yes, she was having abdominal pain a few days ago, but it was different and much less severe than her current pain.   Past Medical History  Diagnosis Date  . Diabetes mellitus     Reportedly diagnosed 2011 but no medication initiated until 04/2011 (Metformin)  . Hypertension   . Pneumonia   . Diabetic peripheral neuropathy     Since 2013  . CHF (congestive heart failure) 08/2012  . Anemia 08/2012  . UTI (urinary tract infection) 08/22/2012    Past Surgical History  Procedure Laterality Date  . Cesarean section      Prior to Admission medications   Medication Sig Start Date End Date Taking? Authorizing Provider  metFORMIN (GLUCOPHAGE) 500 MG tablet Take 250 mg by mouth 2 (two) times daily with a meal.    Yes Historical Provider, MD  OVER THE COUNTER MEDICATION Take 1 tablet by mouth 2 (two) times daily. One a day vitamin for diabetics   Yes  Historical Provider, MD  OVER THE COUNTER MEDICATION Take 15 mLs by mouth daily as needed (cough). Over the counter cough syrup   Yes Historical Provider, MD    Current Facility-Administered Medications  Medication Dose Route Frequency Provider Last Rate Last Dose  . 0.9 %  sodium chloride infusion   Intravenous Continuous Thurnell Lose, MD 50 mL/hr at 03/12/13 1108 50 mL/hr at 03/12/13 1108  . cefTRIAXone (ROCEPHIN) 1 g in dextrose 5 % 50 mL IVPB  1 g Intravenous Q24H Thurnell Lose, MD   1 g at 03/11/13 1958  . heparin injection 5,000 Units  5,000 Units Subcutaneous Q8H Dayna N Dunn, PA-C   5,000 Units at 03/12/13 0541  . hydrALAZINE (APRESOLINE) injection 20 mg  20 mg Intravenous Q6H PRN Leonie Man, MD      . hydrALAZINE (APRESOLINE) tablet 37.5 mg  37.5 mg Oral Q8H Mihai Croitoru, MD   37.5 mg at 03/12/13 0540  . HYDROcodone-acetaminophen (NORCO/VICODIN) 5-325 MG per tablet 1 tablet  1 tablet Oral Q6H PRN Thurnell Lose, MD   1 tablet at 03/12/13 0640  . insulin aspart (novoLOG) injection 0-9 Units  0-9 Units Subcutaneous TID WC Shanda Howells, MD   1 Units at 03/12/13 [phone]  . isosorbide dinitrate (ISORDIL) tablet 20 mg  20 mg Oral TID Erlene Quan, PA-C   20 mg at 03/12/13 1105  . metoprolol (LOPRESSOR) tablet 50 mg  50 mg Oral BID Thurnell Lose, MD  50 mg at 03/12/13 1105  . morphine 2 MG/ML injection 2 mg  2 mg Intravenous Q2H PRN Thurnell Lose, MD   2 mg at 03/12/13 1113  . ondansetron (ZOFRAN) injection 4 mg  4 mg Intravenous Q6H PRN Thurnell Lose, MD   4 mg at 03/12/13 0640  . pantoprazole (PROTONIX) 80 mg in sodium chloride 0.9 % 250 mL infusion  8 mg/hr Intravenous Continuous Thurnell Lose, MD 25 mL/hr at 03/12/13 0855 8 mg/hr at 03/12/13 0855  . [START ON 03/15/2013] pantoprazole (PROTONIX) injection 40 mg  40 mg Intravenous Q12H Thurnell Lose, MD      . sodium chloride 0.9 % injection 3 mL  3 mL Intravenous Q12H Shanda Howells, MD   3 mL at 03/11/13 2130  .  sodium chloride 0.9 % injection 3 mL  3 mL Intravenous Q12H Shanda Howells, MD   3 mL at 03/12/13 1106  . sodium chloride 0.9 % injection 3 mL  3 mL Intravenous PRN Shanda Howells, MD        Allergies as of 03/05/2013  . (No Known Allergies)    Family History  Problem Relation Age of Onset  . Diabetes Brother     deceased in 84Z dt DM complications    History   Social History  . Marital Status: Married    Spouse Name: N/A    Number of Children: N/A  . Years of Education: N/A   Occupational History  . Not on file.   Social History Main Topics  . Smoking status: Never Smoker   . Smokeless tobacco: Never Used  . Alcohol Use: No  . Drug Use: No  . Sexual Activity: Not on file   Other Topics Concern  . Not on file   Social History Narrative   Patient lives with husband and son. Currently without insurance and therefore tries not seek medical care dt financial concern.     Review of Systems: Unable to obtain meaningful ROS due to language barrier difficulties, despite use of language interpreter.  Physical Exam: Vital signs in last 24 hours: Temp:  [96 F (35.6 C)-98 F (36.7 C)] 97.5 F (36.4 C) (02/03 0452) Pulse Rate:  [57-65] 58 (02/03 0452) Resp:  [18-20] 20 (02/03 0452) BP: (162-166)/(68-90) 166/90 mmHg (02/03 0452) SpO2:  [97 %-99 %] 99 % (02/03 0452) Weight:  [55.7 kg (122 lb 12.7 oz)] 55.7 kg (122 lb 12.7 oz) (02/03 0452) Last BM Date: 03/09/13 General:   Alert,  Uncomfortable-appearing, groaning in bed Head:  Normocephalic and atraumatic. Eyes:  Sclera clear, no icterus.   Conjunctiva pink. Ears:  Normal auditory acuity. Nose:  No deformity, discharge,  or lesions. Mouth:  No deformity or lesions.  Oropharynx pink & moist. Neck:  Supple; no masses or thyromegaly. Lungs:  Clear throughout to auscultation.   No wheezes, crackles, or rhonchi. No acute distress. Heart:  Regular rate and rhythm; no murmurs, clicks, rubs,  or gallops. Abdomen:  Soft,  non-distended, active bowel sounds, mild epigastric and left periumbilical tenderness without peritonitis. No masses, hepatosplenomegaly or hernias noted. Normal bowel sounds, without guarding, and without rebound.     Msk:  Symmetrical without gross deformities. Normal posture. Pulses:  Normal pulses noted. Extremities:  Without clubbing or edema. Neurologic:  Alert and  oriented x4;  Diffusely weak, otherwise grossly normal neurologically. Skin:  Intact without significant lesions or rashes. Psych:  Alert and cooperative. Normal mood and affect.   Lab Results:  Recent Labs  03/10/13 0516 03/11/13 0544  WBC 5.1 5.8  HGB 10.8* 10.9*  HCT 32.0* 32.8*  PLT 110* 121*   BMET  Recent Labs  03/10/13 0516 03/11/13 0544 03/12/13 0352  NA 139 135* 138  K 5.0 4.2 4.0  CL 103 100 102  CO2 21 21 17*  GLUCOSE 113* 131* 154*  BUN 47* 46* 42*  CREATININE 2.67* 3.08* 2.74*  CALCIUM 8.1* 8.1* 8.3*   LFT  Recent Labs  03/10/13 0516  PROT 6.7  ALBUMIN 2.6*  AST 25  ALT 13  ALKPHOS 70  BILITOT 0.4   PT/INR No results found for this basename: LABPROT, INR,  in the last 72 hours  Studies/Results: US Renal  03/10/2013   CLINICAL DATA:  Acute on stage III chronic kidney disease  EXAM: RENAL/URINARY TRACT ULTRASOUND COMPLETE  COMPARISON:  CT abdomen and pelvis 03/09/2013  FINDINGS: Right Kidney:  Length: 9.7 cm. Normal cortical thickness. Increased cortical echogenicity. No mass, hydronephrosis or shadowing calcification. Minimal perinephric fluid upper pole. Mild ascites.  Left Kidney:  Length: 11.2 cm. Normal cortical thickness. Increased cortical echogenicity. No solid mass or hydronephrosis. Small cysts are identified, 15 x 10 x 12 mm at mid left kidney and 10 x 7 x 9 mm at inferior pole.  Bladder:  Contains minimal urine, decompressed by Foley catheter.  Ascites and bilateral pleural effusions noted.  IMPRESSION: Medical renal disease changes of both kidneys.  Small amount of ascites.   Bilateral pleural effusions.  No evidence of renal mass or hydronephrosis.   Electronically Signed   By: Lavonia Dana M.D.   On: 03/10/2013 11:56   Dg Abd Portable 1v  03/12/2013   CLINICAL DATA:  Mid abdominal discomfort  EXAM: PORTABLE ABDOMEN - 1 VIEW  COMPARISON:  US RENAL dated 03/10/2013; CT ABD/PELV WO CM dated 03/09/2013; DG ABD PORTABLE 1V dated 03/07/2013  FINDINGS: There is mild gaseous distention of the stomach. There are loops of mildly distended gas-filled small bowel in the lower abdomen. There is contrast within normal calibered loops of the colon from the CT scan of 09 March 2013. There are stable calcifications in the left upper quadrant of the abdomen. There are bilateral pleural effusions.  IMPRESSION: The bowel gas pattern is nonspecific. A mild ileus type pattern is suspected. The colon does not appear abnormally distended.   Electronically Signed   By: David  Martinique   On: 03/12/2013 08:51   Impression:  1.  Abdominal pain.  Ill-defined.  Unclear etiology.  Despite extensive discussion with phone language interpreter, it is quite challenging to get patient to answer questions directly.  She appears uncomfortable, groaning, but her abdominal exam is benign-appearing.  Plan:  1.  CT abdomen/pelvis with oral contrast only, noting that she had unrevealing CT a few days ago, but tells me her pain is different and much more severe now. 2.  If CT is unrevealing, would do endoscopy and, possibly, abdominal ultrasound. 3.  Doubt utility of gastric emptying study, as regardless of result, doubt it will appreciably alter management. 4.  If all above tests are unrevealing, would consider laxatives for constipation and see if this helps her pain. 5.  Over 30 minutes spent in direct patient counseling and coordination of care. 6.  Thank you for consult.  Will follow.   LOS: 7 days   Analese Sovine M  03/12/2013, 11:14 AM

## 2013-03-14 ENCOUNTER — Inpatient Hospital Stay (HOSPITAL_COMMUNITY): Payer: Medicaid Other

## 2013-03-14 ENCOUNTER — Encounter (HOSPITAL_COMMUNITY): Payer: Self-pay | Admitting: Gastroenterology

## 2013-03-14 DIAGNOSIS — I2510 Atherosclerotic heart disease of native coronary artery without angina pectoris: Secondary | ICD-10-CM

## 2013-03-14 DIAGNOSIS — E119 Type 2 diabetes mellitus without complications: Secondary | ICD-10-CM

## 2013-03-14 DIAGNOSIS — J90 Pleural effusion, not elsewhere classified: Secondary | ICD-10-CM

## 2013-03-14 DIAGNOSIS — N179 Acute kidney failure, unspecified: Secondary | ICD-10-CM

## 2013-03-14 DIAGNOSIS — I2789 Other specified pulmonary heart diseases: Secondary | ICD-10-CM

## 2013-03-14 LAB — GLUCOSE, CAPILLARY
Glucose-Capillary: 125 mg/dL — ABNORMAL HIGH (ref 70–99)
Glucose-Capillary: 143 mg/dL — ABNORMAL HIGH (ref 70–99)
Glucose-Capillary: 137 mg/dL — ABNORMAL HIGH (ref 70–99)
Glucose-Capillary: 195 mg/dL — ABNORMAL HIGH (ref 70–99)

## 2013-03-14 LAB — COMPREHENSIVE METABOLIC PANEL
ALT: 9 U/L (ref 0–35)
AST: 16 U/L (ref 0–37)
Albumin: 2.6 g/dL — ABNORMAL LOW (ref 3.5–5.2)
Alkaline Phosphatase: 63 U/L (ref 39–117)
BUN: 43 mg/dL — ABNORMAL HIGH (ref 6–23)
CALCIUM: 8.4 mg/dL (ref 8.4–10.5)
CO2: 21 meq/L (ref 19–32)
Chloride: 101 mEq/L (ref 96–112)
Creatinine, Ser: 2.7 mg/dL — ABNORMAL HIGH (ref 0.50–1.10)
GFR calc Af Amer: 20 mL/min — ABNORMAL LOW (ref >90)
GFR, EST NON AFRICAN AMERICAN: 18 mL/min — AB (ref >90)
Glucose, Bld: 126 mg/dL — ABNORMAL HIGH (ref 70–99)
Potassium: 3.4 mEq/L — ABNORMAL LOW (ref 3.7–5.3)
Sodium: 139 mEq/L (ref 137–147)
TOTAL PROTEIN: 6.9 g/dL (ref 6.0–8.3)
Total Bilirubin: 0.4 mg/dL (ref 0.3–1.2)

## 2013-03-14 LAB — CBC
HCT: 35.1 % — ABNORMAL LOW (ref 36.0–46.0)
Hemoglobin: 11.7 g/dL — ABNORMAL LOW (ref 12.0–15.0)
MCH: 29.8 pg (ref 26.0–34.0)
MCHC: 33.3 g/dL (ref 30.0–36.0)
MCV: 89.3 fL (ref 78.0–100.0)
PLATELETS: 150 10*3/uL (ref 150–400)
RBC: 3.93 MIL/uL (ref 3.87–5.11)
RDW: 17.7 % — ABNORMAL HIGH (ref 11.5–15.5)
WBC: 4.9 10*3/uL (ref 4.0–10.5)

## 2013-03-14 LAB — HEPATITIS C ANTIBODY: HCV Ab: NEGATIVE

## 2013-03-14 LAB — HEPATITIS B SURFACE ANTIBODY,QUALITATIVE: Hep B S Ab: NEGATIVE

## 2013-03-14 LAB — HEPATITIS B SURFACE ANTIGEN: Hepatitis B Surface Ag: NEGATIVE

## 2013-03-14 LAB — TROPONIN I

## 2013-03-14 MED ORDER — POTASSIUM CHLORIDE CRYS ER 20 MEQ PO TBCR
40.0000 meq | EXTENDED_RELEASE_TABLET | Freq: Once | ORAL | Status: AC
  Administered 2013-03-14: 40 meq via ORAL
  Filled 2013-03-14: qty 2

## 2013-03-14 MED ORDER — GLUCERNA SHAKE PO LIQD
237.0000 mL | Freq: Three times a day (TID) | ORAL | Status: DC
  Administered 2013-03-14 – 2013-03-19 (×10): 237 mL via ORAL

## 2013-03-14 MED ORDER — POTASSIUM CHLORIDE CRYS ER 20 MEQ PO TBCR: 40.0000 meq | EXTENDED_RELEASE_TABLET | Freq: Once | ORAL | Status: DC

## 2013-03-14 MED ORDER — HYDRALAZINE HCL 20 MG/ML IJ SOLN
10.0000 mg | Freq: Once | INTRAMUSCULAR | Status: AC
  Administered 2013-03-14: 10 mg via INTRAVENOUS
  Filled 2013-03-14: qty 1

## 2013-03-14 NOTE — Progress Notes (Signed)
Advanced Heart Failure Rounding Note   Subjective:   63 y/o [name] with h/o poorly controlled DM2, CKD and diastolic HF. Admitted 1/28 with recurrent HF.   Echo showed EF 30% with mod MR/TR. 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 Markedly elevated biventricular filling pressures on RHC with low normal index   RA 15  RV 80/8/30 (??)  PA 75/36 (45)  PCWP 30  LVEDP 31  Fick 3.8/2.6  Themo 3.0/2.2  PVR .9 WU   Started on IV lasix but weight continued to increase (113 -> 126lbs) and renal function continued to decline (1.5->3.1->2.8). Had increasing fatigue and ab pain, EGD nl and CT abdomen showed ascites and anasarca.  Started on milrinone yesterday and IV lasix. Continues to have SOB.   Cr slightly improved 2.7  Objective:   Weight Range:  Vital Signs:   Temp:  [97.2 F (36.2 C)-97.7 F (36.5 C)] 97.7 F (36.5 C) (02/05 0414) Pulse Rate:  [55-68] 68 (02/05 0414) Resp:  [16-20] 16 (02/05 0414) BP: (163-190)/(69-88) 163/69 mmHg (02/05 0414) SpO2:  [95 %-100 %] 100 % (02/05 0414) Weight:  [126 lb 1.7 oz (57.2 kg)] 126 lb 1.7 oz (57.2 kg) (02/05 0414) Last BM Date: 03/09/13  Weight change: Filed Weights   03/12/13 0452 03/13/13 0440 03/14/13 0414  Weight: 122 lb 12.7 oz (55.7 kg) 126 lb 15.8 oz (57.6 kg) 126 lb 1.7 oz (57.2 kg)    Intake/Output:   Intake/Output Summary (Last 24 hours) at 03/14/13 1240 Last data filed at 03/14/13 0517  Gross per 24 hour  Intake    140 ml  Output   2850 ml  Net  -2710 ml     Physical Exam: General: Fatigued ill-appearing. No resp difficulty; lying in bed HEENT: normal  Neck: supple. JVP 9. Carotids 2+ bilat; no bruits. No lymphadenopathy or thryomegaly appreciated.  Cor: PMI nondisplaced. Regular rate & rhythm. 2/6 MR 2/6 TR  Lungs: clear. Decreased at bases  Abdomen: soft, nontender, + distended. No hepatosplenomegaly. No bruits or masses. Good bowel sounds.  Extremities:  warm no cyanosis, clubbing, rash, tr-1+ edema  Neuro: alert & orientedx3, cranial nerves grossly intact. moves all 4 extremities w/o difficulty. Affect pleasant  Telemetry: SR   Labs: Basic Metabolic Panel:  Recent Labs Lab 03/10/13 0516 03/11/13 0544 03/12/13 0352 03/13/13 0250 03/14/13 0443  NA 139 135* 138 138 139  K 5.0 4.2 4.0 4.2 3.4*  CL 103 100 102 104 101  CO2 21 21 17* 18* 21  GLUCOSE 113* 131* 154* 132* 126*  BUN 47* 46* 42* 42* 43*  CREATININE 2.67* 3.08* 2.74* 2.78* 2.70*  CALCIUM 8.1* 8.1* 8.3* 8.1* 8.4    Liver Function Tests:  Recent Labs Lab 03/09/13 0437 03/10/13 0516 03/12/13 0817 03/13/13 0250 03/14/13 0443  AST 22 25 21 14 16   ALT 13 13 11 9 9   ALKPHOS 75 70 71 62 63  BILITOT 0.5 0.4 0.4 0.3 0.4  PROT 6.8 6.7 6.9 6.6 6.9  ALBUMIN 2.6* 2.6* 2.7* 2.6* 2.6*    Recent Labs Lab 03/12/13 0817  LIPASE 18   No results found for this basename: AMMONIA,  in the last 168 hours  CBC:  Recent Labs Lab 03/08/13 0339  03/09/13 0437 03/10/13 0516 03/11/13 0544 03/13/13 0250 03/14/13 0443  WBC 3.7*  < > 4.2 5.1 5.8 4.8 4.9  NEUTROABS 2.2  --  2.7 3.1 3.9  --   --   HGB  10.8*  < > 11.4* 10.8* 10.9* 11.2* 11.7*  HCT 32.2*  < > 34.2* 32.0* 32.8* 33.8* 35.1*  MCV 89.4  < > 89.8 89.4 89.6 89.9 89.3  PLT 117*  < > 121* 110* 121* 131* 150  < > = values in this interval not displayed.  Cardiac Enzymes: No results found for this basename: CKTOTAL, CKMB, CKMBINDEX, TROPONINI,  in the last 168 hours  BNP: BNP (last 3 results)  Recent Labs  08/22/12 0930 03/05/13 2204  PROBNP 6331.0* 21512.0*     Imaging: Mr Jodene Nam Abdomen Wo Contrast  03/12/2013   CLINICAL DATA:  Mesenteric ischemia  EXAM: MRA ABDOMEN AND PELVIS WITHOUT CONTRAST  TECHNIQUE: Multiplanar, multiecho pulse sequences of the abdomen and pelvis were obtained WITHOUT intravenous contrast. Angiographic images of abdomen and pelvis were obtained using MRA technique WITHOUT intravenous  contrast.  CONTRAST:  Noncontrast study  COMPARISON:  None.  FINDINGS: MRA ABDOMEN FINDINGS  This study is markedly limited secondary to patient motion.  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 Branch vessels of the celiac are grossly patent.  There is long segment 50% narrowing in the proximal SMA including its origin. The origin of the IMA is obscured. Branch vessels are not clearly visualized.  Single renal arteries are patent.  Aorta is non aneurysmal and patent.  Small bilateral pleural effusions are present. Diffuse anasarca is noted. Free fluid in the abdomen is suspected.  IMPRESSION:  MRA ABDOMEN IMPRESSION  This study is limited secondary to patient motion.  There is 50% narrowing in the proximal SMA and this is not likely to be significant.  There is also narrowing at the origin of the celiac axis, however its origin was obscured.  The IMA was obscured. It is either occluded or was not clearly visualized on this study.   Electronically Signed   By: Maryclare Bean M.D.   On: 03/12/2013 14:50   US Abdomen Complete  03/13/2013   CLINICAL DATA:  Diabetes.  Hypertension.  EXAM: ULTRASOUND ABDOMEN COMPLETE  COMPARISON:  MR MRA ABD W/O CM dated 03/12/2013; CT ABD/PELV WO CM dated 03/12/2013  FINDINGS: Gallbladder:  No gallstones or wall thickening visualized. No sonographic Murphy sign noted.  Common bile duct:  Diameter: 3.2 mm.  Liver:  No focal lesion identified. Within normal limits in parenchymal echogenicity.  IVC:  No abnormality visualized.  Pancreas:  Visualized portion unremarkable.  Spleen:  Size and appearance within normal limits.  Right Kidney:  Length: 8.6 cm. Right kidney is mildly echogenic and atrophic. 8 mm caliceal stone noted.  Left Kidney:  Length: 10.5 cm. Echogenicity within normal limits. No suggest mass or hydronephrosis visualized. 1.3 x 1.1 x 1.1 cm simple cyst. 0.8 by 0.6 x 0.7 cm simple cyst.  Abdominal  aorta:  No aneurysm visualized.  Other findings:  Bilateral pleural effusions and ascites present.  IMPRESSION: 1. Mild right renal atrophy.  8 mm calyceal stone right kidney. 2. Bilateral pleural effusion and ascites. 3. No hepatobiliary disease noted.   Electronically Signed   By: Marcello Moores  Register   On: 03/13/2013 14:06      Medications:     Scheduled Medications: . cefTRIAXone (ROCEPHIN)  IV  1 g Intravenous Q24H  . furosemide  80 mg Intravenous BID  . heparin subcutaneous  5,000 Units Subcutaneous Q8H  . hydrALAZINE  37.5 mg Oral Q8H  . insulin aspart  0-9 Units Subcutaneous TID  WC  . isosorbide dinitrate  20 mg Oral TID  . pantoprazole  40 mg Oral BID  . sodium bicarbonate  1,300 mg Oral BID  . sodium chloride  3 mL Intravenous Q12H  . sodium chloride  3 mL Intravenous Q12H     Infusions: . milrinone 0.25 mcg/kg/min (03/13/13 2011)     PRN Medications:  hydrALAZINE, HYDROcodone-acetaminophen, morphine injection, ondansetron, sodium chloride   Assessment:   1. Acute/chronic systolic HF with probable cardiogenic shock  2. A/c renal failure likely due to cardiorenal syndrome +/- contrast nephropathy  3. Ab pain due to low cardiac output  4. Anasarca  5. 2-V CAD  6. DM2  Plan/Discussion:    Patient started on milrinone yesterday, however still complains of SOB. We called the translator to talk to the patient and would like to move her to stepdown unit to place PICC to monitor CVPs and co-oxs. Will also place for IR paracentesis. Discussed all of this with the patient through the interpreter and she agreed with moving forward and that she will sign the consent. Also called her son and discussed with him.  Cr slightly improved on milrinone, will continue to follow.   Length of Stay: Leonidas NP-C 03/14/2013, 12:40 PM  Advanced Heart Failure Team Pager [phone] (M-F; [place])  Please contact [facility] for night-coverage after hours (4p -7a ) and  weekends on [email or web address]  Patient seen and examined with Junie Bame, NP. We discussed all aspects of the encounter. I agree with the assessment and plan as stated above.   Spoke with patient through interpreter line. She continues to experience SOB. Denies ab pain. Renal function mildly improved with milrinone but still volume overloaded. Will transfer to [place], place PICC and follow co-ox and CVP. Will also order paracentesis - both procedures explained to her through interpreter and consent obtained.  Staria Birkhead,MD 1:30 PM

## 2013-03-14 NOTE — Procedures (Signed)
Central Venous Catheter Insertion Procedure Note [name] [name] [id number] [birth year]  Procedure: Insertion of Central Venous Catheter Indications: Assessment of intravascular volume  Procedure Details Consent: Risks of procedure as well as the alternatives and risks of each were explained to the (patient/caregiver).  Consent for procedure obtained. Time Out: Verified patient identification, verified procedure, site/side was marked, verified correct patient position, special equipment/implants available, medications/allergies/relevent history reviewed, required imaging and test results available.  Performed  Maximum sterile technique was used including antiseptics, cap, gloves, gown, hand hygiene, mask and sheet. Skin prep: Chlorhexidine; local anesthetic administered A antimicrobial bonded/coated triple lumen catheter was placed in the right internal jugular vein using the Seldinger technique.  Evaluation Blood flow good Complications: No apparent complications Patient did tolerate procedure well. Chest X-ray ordered to verify placement.  CXR: pending.  [name] [name] 03/14/2013, 7:27 PM

## 2013-03-14 NOTE — Progress Notes (Signed)
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) in setting of systolic HF, pulm HTN, and cardiac catheterization 03/08/13  Subjective:  Seen by HF yesterday Started on milrinone gtt Good UOP Pt states she feels improved Remains on BID lasix 80 IV Weight down this AM   02/04 0701 - 02/05 0700 In: 1197.5 [P.O.:100; I.V.:947.5; IV Piggyback:150] Out: 2850 [Urine:2850]  Filed Weights   03/12/13 0452 03/13/13 0440 03/14/13 0414  Weight: 55.7 kg (122 lb 12.7 oz) 57.6 kg (126 lb 15.8 oz) 57.2 kg (126 lb 1.7 oz)    Current meds: reviewed  Current Labs: reviewed    Physical Exam:  Blood pressure 163/69, pulse 68, temperature 97.7 F (36.5 C), temperature source Oral, resp. rate 16, height 4' 11.06" (1.5 m), weight 57.2 kg (126 lb 1.7 oz), SpO2 100.00%. Lying in bed, flat, breathing comfortably RRR Lungs CTAB, w/o crackles No rashes/lesions ABD mildly TTP, soft, no r/g Trace LEE b/l Nonfocal Nl mood/affect NCAT  Assessment/Plan 1. 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 Cardiorenal: stable over past 24h, diuresing nicely.  Cont plan 2. Systolic HF, pulm HTN, Ascites: as above and per cardiology, on milrinone gtt 3. HTN: Stable 4. DM2: per TRH 5.   N/V, abd pain: GI following.  Neg EGD and abd [place].  Lipase was WNL  [name] MD 03/14/2013, 9:38 AM   Recent Labs Lab 03/12/13 0352 03/13/13 0250 03/14/13 0443  NA 138 138 139  K 4.0 4.2 3.4*  CL 102 104 101  CO2 17* 18* 21  GLUCOSE 154* 132* 126*  BUN 42* 42* 43*  CREATININE 2.74* 2.78* 2.70*  CALCIUM 8.3* 8.1* 8.4    Recent Labs Lab 03/09/13 0437 03/10/13 0516 03/11/13 0544 03/13/13 0250 03/14/13 0443  WBC 4.2 5.1 5.8 4.8 4.9  NEUTROABS 2.7 3.1 3.9  --   --   HGB 11.4* 10.8* 10.9* 11.2* 11.7*  HCT 34.2* 32.0* 32.8* 33.8* 35.1*  MCV 89.8 89.4 89.6 89.9 89.3  PLT 121* 110* 121* 131* 150

## 2013-03-14 NOTE — Progress Notes (Signed)
[facility] PROGRESS NOTE  [name] [name] RCV:[id number] DOB: 1950/04/23 DOA: 03/05/2013 PCP: No PCP Per Patient  Assessment/Plan:  Active Problems:   Acute CHF - Cardiology/Heart Failure team on board and managing-pt to be moved to stepdown for invasive line placement and IR paracentesis. - on lasix and milrinone drip for diuresis     AKI (acute kidney injury) on chronic kidney disease - Nephrology on board and managing.    Diabetes mellitus, type 2 -Currently on SSI -Blood sugars relatively well controlled on this regimen    CAD - severe 2V at cath 03/08/13 -Cardiology on board   Mild right upper quadrant abdominal pain.  -Liver enzymes are WNL - pain now resolved, no nausea, stable right upper quadrant ultrasound. - GI consulted- patient status post EGD with no findings to explain symptoms. Suspect pain related to chronic cardiac problems.  Code Status: full Family Communication: No family at bedside  Disposition Plan: Pending further recommendations from specialists on board. Transition to stepdown   Consultants:  Cardiology/Heart failure  Nephrology  Gastroenterology  Procedures:  EGD  [place] Abd  MRI Abd w/o contrast  CT Abd w/o contrast  DG Abd   Antibiotics:  Rocephin- Day 6  HPI/Subjective: No acute issues reported overnight to me.  Objective: Filed Vitals:   03/14/13 1423  BP: 176/67  Pulse: 77  Temp: 98.1 F (36.7 C)  Resp: 16    Intake/Output Summary (Last 24 hours) at 03/14/13 1516 Last data filed at 03/14/13 1500  Gross per 24 hour  Intake   1390 ml  Output   2850 ml  Net  -1460 ml   Filed Weights   03/12/13 0452 03/13/13 0440 03/14/13 0414  Weight: 55.7 kg (122 lb 12.7 oz) 57.6 kg (126 lb 15.8 oz) 57.2 kg (126 lb 1.7 oz)    Exam:   General:  Pt in NAD, Alert and awake  Cardiovascular: Normal S1 and S2, no murmurs  Respiratory: Breath sounds heard bilaterally, no wheezes, no increased work of breathing  Abdomen: Soft,  non-distended, + bowel sounds  Musculoskeletal: No cyanosis or clubbing.  Data Reviewed: Basic Metabolic Panel:  Recent Labs Lab 03/10/13 0516 03/11/13 0544 03/12/13 0352 03/13/13 0250 03/14/13 0443  NA 139 135* 138 138 139  K 5.0 4.2 4.0 4.2 3.4*  CL 103 100 102 104 101  CO2 21 21 17* 18* 21  GLUCOSE 113* 131* 154* 132* 126*  BUN 47* 46* 42* 42* 43*  CREATININE 2.67* 3.08* 2.74* 2.78* 2.70*  CALCIUM 8.1* 8.1* 8.3* 8.1* 8.4   Liver Function Tests:  Recent Labs Lab 03/09/13 0437 03/10/13 0516 03/12/13 0817 03/13/13 0250 03/14/13 0443  AST 22 25 21 14 16   ALT 13 13 11 9 9   ALKPHOS 75 70 71 62 63  BILITOT 0.5 0.4 0.4 0.3 0.4  PROT 6.8 6.7 6.9 6.6 6.9  ALBUMIN 2.6* 2.6* 2.7* 2.6* 2.6*    Recent Labs Lab 03/12/13 0817  LIPASE 18   No results found for this basename: AMMONIA,  in the last 168 hours CBC:  Recent Labs Lab 03/08/13 0339  03/09/13 0437 03/10/13 0516 03/11/13 0544 03/13/13 0250 03/14/13 0443  WBC 3.7*  < > 4.2 5.1 5.8 4.8 4.9  NEUTROABS 2.2  --  2.7 3.1 3.9  --   --   HGB 10.8*  < > 11.4* 10.8* 10.9* 11.2* 11.7*  HCT 32.2*  < > 34.2* 32.0* 32.8* 33.8* 35.1*  MCV 89.4  < > 89.8 89.4 89.6 89.9 89.3  PLT 117*  < > 121* 110* 121* 131* 150  < > = values in this interval not displayed. Cardiac Enzymes: No results found for this basename: CKTOTAL, CKMB, CKMBINDEX, TROPONINI,  in the last 168 hours BNP (last 3 results)  Recent Labs  08/22/12 0930 03/05/13 2204  PROBNP 6331.0* 21512.0*   CBG:  Recent Labs Lab 03/13/13 1213 03/13/13 1751 03/13/13 2129 03/14/13 0636 03/14/13 1125  GLUCAP 132* 169* 136* 125* 143*    No results found for this or any previous visit (from the past 240 hour(s)).   Studies: US Abdomen Complete  03/13/2013   CLINICAL DATA:  Diabetes.  Hypertension.  EXAM: ULTRASOUND ABDOMEN COMPLETE  COMPARISON:  MR MRA ABD W/O CM dated 03/12/2013; CT ABD/PELV WO CM dated 03/12/2013  FINDINGS: Gallbladder:  No gallstones or wall  thickening visualized. No sonographic Murphy sign noted.  Common bile duct:  Diameter: 3.2 mm.  Liver:  No focal lesion identified. Within normal limits in parenchymal echogenicity.  IVC:  No abnormality visualized.  Pancreas:  Visualized portion unremarkable.  Spleen:  Size and appearance within normal limits.  Right Kidney:  Length: 8.6 cm. Right kidney is mildly echogenic and atrophic. 8 mm caliceal stone noted.  Left Kidney:  Length: 10.5 cm. Echogenicity within normal limits. No suggest mass or hydronephrosis visualized. 1.3 x 1.1 x 1.1 cm simple cyst. 0.8 by 0.6 x 0.7 cm simple cyst.  Abdominal aorta:  No aneurysm visualized.  Other findings:  Bilateral pleural effusions and ascites present.  IMPRESSION: 1. Mild right renal atrophy.  8 mm calyceal stone right kidney. 2. Bilateral pleural effusion and ascites. 3. No hepatobiliary disease noted.   Electronically Signed   By: Marcello Moores  Register   On: 03/13/2013 14:06    Scheduled Meds: . cefTRIAXone (ROCEPHIN)  IV  1 g Intravenous Q24H  . furosemide  80 mg Intravenous BID  . heparin subcutaneous  5,000 Units Subcutaneous Q8H  . hydrALAZINE  37.5 mg Oral Q8H  . insulin aspart  0-9 Units Subcutaneous TID WC  . isosorbide dinitrate  20 mg Oral TID  . pantoprazole  40 mg Oral BID  . potassium chloride  40 mEq Oral Once  . sodium bicarbonate  1,300 mg Oral BID  . sodium chloride  3 mL Intravenous Q12H  . sodium chloride  3 mL Intravenous Q12H   Continuous Infusions: . milrinone 0.25 mcg/kg/min (03/13/13 2011)    Time spent: > 35 minutes    Larwance Sachs  [facility] Pager [phone]. If 7PM-7AM, please contact night-coverage at [email or web address], password [email or web address] 03/14/2013, 3:16 PM  LOS: 9 days

## 2013-03-14 NOTE — Progress Notes (Signed)
[facility] PROGRESS NOTE Subjective Pt w/o pain this AM.  US shows no GSs, liver appears normal  Seen by cardiology probably with chronic CHF.  Objective: Vital signs in last 24 hours: Temp:  [97.2 F (36.2 C)-97.7 F (36.5 C)] 97.7 F (36.5 C) (02/05 0414) Pulse Rate:  [49-68] 68 (02/05 0414) Resp:  [16-26] 16 (02/05 0414) BP: (97-198)/(46-88) 163/69 mmHg (02/05 0414) SpO2:  [95 %-100 %] 100 % (02/05 0414) Weight:  [57.2 kg (126 lb 1.7 oz)] 57.2 kg (126 lb 1.7 oz) (02/05 0414) Last BM Date: 03/09/13  Intake/Output from previous day: 02/04 0701 - 02/05 0700 In: 1197.5 [P.O.:100; I.V.:947.5; IV Piggyback:150] Out: 2850 [Urine:2850] Intake/Output this shift:    PE: General--NAD  Abdomen--nontender.  Lab Results:  Recent Labs  03/13/13 0250 03/14/13 0443  WBC 4.8 4.9  HGB 11.2* 11.7*  HCT 33.8* 35.1*  PLT 131* 150   BMET  Recent Labs  03/12/13 0352 03/13/13 0250 03/14/13 0443  NA 138 138 139  K 4.0 4.2 3.4*  CL 102 104 101  CO2 17* 18* 21  CREATININE 2.74* 2.78* 2.70*   LFT  Recent Labs  03/12/13 0817 03/13/13 0250 03/14/13 0443  PROT 6.9 6.6 6.9  AST 21 14 16   ALT 11 9 9   ALKPHOS 71 62 63  BILITOT 0.4 0.3 0.4  BILIDIR <0.2  --   --   IBILI NOT CALCULATED  --   --    PT/INR No results found for this basename: LABPROT, INR,  in the last 72 hours PANCREAS  Recent Labs  03/12/13 0817  LIPASE 18         Studies/Results: Ct Abdomen Pelvis Wo Contrast  03/12/2013   CLINICAL DATA:  Abdominal pain. Ischemic colitis and acute renal failure.  EXAM: CT ABDOMEN AND PELVIS WITHOUT CONTRAST  TECHNIQUE: Multidetector CT imaging of the abdomen and pelvis was performed following the standard protocol without intravenous contrast.  COMPARISON:  DG ABD PORTABLE 1V dated 03/12/2013; US RENAL dated 03/10/2013; CT ABD/PELV WO CM dated 03/09/2013  FINDINGS: Right-greater-than-left pleural and spinal pericardial effusions are stable. There is worsening  atelectasis at both lung bases. There is worsening generalized anasarca with diffuse subcutaneous and intra-abdominal edema. Ascites has not significantly changed in volume.  The noncontrast appearance of the liver, spleen, pancreas and adrenal glands is stable. There is a small amount of residual contrast material within the gallbladder lumen. Both kidneys demonstrate persistent delayed nephrograms consistent with acute renal failure. Multifocal renal cortical scarring is present on the right. There are probable small renal calyceal calculi on the right. There is no hydronephrosis.  There is enteric contrast material within the colon which is normal in caliber. There is no colonic wall thickening or pneumatosis. The stomach and small bowel demonstrate no significant findings. There is no extravasated enteric contrast. Aortoiliac atherosclerosis is noted. Vascular assessment is limited without contrast.  The urinary bladder is decompressed by a Foley catheter. Vascular calcifications are present within the uterus. There is no adnexal mass.  There are no worrisome osseous findings.  IMPRESSION: 1. Progressive anasarca with increasing diffuse soft tissue edema. The pleural and pericardial effusions and ascites have not significantly changed in volume. 2. No specific evidence of bowel ischemia on non contrast imaging. 3. Persistent delayed nephrograms consistent with acute renal failure. The right kidney demonstrates multifocal scarring and non obstructive calyceal calculi. 4. Mildly worsened atelectasis at both lung bases.   Electronically Signed   By: Modesta Messing.D.  On: 03/12/2013 12:42   Mr Jodene Nam Abdomen Wo Contrast  03/12/2013   CLINICAL DATA:  Mesenteric ischemia  EXAM: MRA ABDOMEN AND PELVIS WITHOUT CONTRAST  TECHNIQUE: Multiplanar, multiecho pulse sequences of the abdomen and pelvis were obtained WITHOUT intravenous contrast. Angiographic images of abdomen and pelvis were obtained using MRA technique  WITHOUT intravenous contrast.  CONTRAST:  Noncontrast study  COMPARISON:  None.  FINDINGS: MRA ABDOMEN FINDINGS  This study is markedly limited secondary to patient motion.  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 Branch vessels of the celiac are grossly patent.  There is long segment 50% narrowing in the proximal SMA including its origin. The origin of the IMA is obscured. Branch vessels are not clearly visualized.  Single renal arteries are patent.  Aorta is non aneurysmal and patent.  Small bilateral pleural effusions are present. Diffuse anasarca is noted. Free fluid in the abdomen is suspected.  IMPRESSION:  MRA ABDOMEN IMPRESSION  This study is limited secondary to patient motion.  There is 50% narrowing in the proximal SMA and this is not likely to be significant.  There is also narrowing at the origin of the celiac axis, however its origin was obscured.  The IMA was obscured. It is either occluded or was not clearly visualized on this study.   Electronically Signed   By: Maryclare Bean M.D.   On: 03/12/2013 14:50   US Abdomen Complete  03/13/2013   CLINICAL DATA:  Diabetes.  Hypertension.  EXAM: ULTRASOUND ABDOMEN COMPLETE  COMPARISON:  MR MRA ABD W/O CM dated 03/12/2013; CT ABD/PELV WO CM dated 03/12/2013  FINDINGS: Gallbladder:  No gallstones or wall thickening visualized. No sonographic Murphy sign noted.  Common bile duct:  Diameter: 3.2 mm.  Liver:  No focal lesion identified. Within normal limits in parenchymal echogenicity.  IVC:  No abnormality visualized.  Pancreas:  Visualized portion unremarkable.  Spleen:  Size and appearance within normal limits.  Right Kidney:  Length: 8.6 cm. Right kidney is mildly echogenic and atrophic. 8 mm caliceal stone noted.  Left Kidney:  Length: 10.5 cm. Echogenicity within normal limits. No suggest mass or hydronephrosis visualized. 1.3 x 1.1 x 1.1 cm simple cyst. 0.8 by 0.6 x 0.7 cm simple  cyst.  Abdominal aorta:  No aneurysm visualized.  Other findings:  Bilateral pleural effusions and ascites present.  IMPRESSION: 1. Mild right renal atrophy.  8 mm calyceal stone right kidney. 2. Bilateral pleural effusion and ascites. 3. No hepatobiliary disease noted.   Electronically Signed   By: Marcello Moores  Register   On: 03/13/2013 14:06    Medications: I have reviewed the patient's current medications.  Assessment/Plan: 1. EG Abd Pain/ascites. Does not appear to have GI cause probably due to chronic cardiac problems  Will follow from a distance.   Tadd Holtmeyer JR,Joseangel Nettleton L 03/14/2013, 9:35 AM

## 2013-03-14 NOTE — Progress Notes (Signed)
INITIAL NUTRITION ASSESSMENT  DOCUMENTATION CODES Per approved criteria  -Not Applicable   INTERVENTION: Glucerna Shake PO TID, each supplement provides 220 kcal and 10 grams of protein  NUTRITION DIAGNOSIS: Inadequate oral intake related to poor appetite with nausea as evidenced by poor intake of meals.   Goal: Intake to meet >90% of estimated nutrition needs.  Monitor:  PO intake, labs, weight trend.  Reason for Assessment: MST  63 y.o. female  Admitting Dx: Cough, CHF exacerbation  ASSESSMENT: Patient is a 63 y.o. year old female with significant past medical history of CHF (ECHO 08/2012 with diffuse hypokinesis, LVH, EF 50-55%, mild aortic regurg, mild-moderate mitral regurg- no formal dx systolic/diastolic dysfunction). Pt initially seen at [facility] for CHF exacerbation 01/2013. Clinically improved with lasix. Lasix had to be held 2/2 renal failure. Patient came to the ED with progressive cough over past week with worsening LE edema. Admitted on 1/27 with CHF exacerbation and pleural effusion.  Weight fluctuating with fluid status. Patient has been eating poorly since admission due to poor appetite.   Height: Ht Readings from Last 1 Encounters:  03/06/13 4' 11.06" (1.5 m)    Weight: Wt Readings from Last 1 Encounters:  03/14/13 126 lb 1.7 oz (57.2 kg)    Ideal Body Weight: 44.5 kg  % Ideal Body Weight: 129%  Wt Readings from Last 10 Encounters:  03/14/13 126 lb 1.7 oz (57.2 kg)  03/14/13 126 lb 1.7 oz (57.2 kg)  03/14/13 126 lb 1.7 oz (57.2 kg)  03/14/13 126 lb 1.7 oz (57.2 kg)  03/05/13 122 lb 9.6 oz (55.611 kg)  01/21/13 128 lb 3.2 oz (58.151 kg)  01/19/13 139 lb (63.05 kg)  08/23/12 116 lb 12.8 oz (52.98 kg)    Usual Body Weight: 128 lb  % Usual Body Weight: 98%  BMI:  Body mass index is 25.42 kg/(m^2).  Estimated Nutritional Needs: Kcal: 2595-6387 Protein: 70-80 gm Fluid: 1.4-1.6 L  Skin: no wounds  Diet Order: Cardiac  EDUCATION  NEEDS: -Education not appropriate at this time   Intake/Output Summary (Last 24 hours) at 03/14/13 1300 Last data filed at 03/14/13 0517  Gross per 24 hour  Intake    140 ml  Output   2850 ml  Net  -2710 ml    Last BM: 1/31   Labs:   Recent Labs Lab 03/12/13 0352 03/13/13 0250 03/14/13 0443  NA 138 138 139  K 4.0 4.2 3.4*  CL 102 104 101  CO2 17* 18* 21  BUN 42* 42* 43*  CREATININE 2.74* 2.78* 2.70*  CALCIUM 8.3* 8.1* 8.4  GLUCOSE 154* 132* 126*    CBG (last 3)   Recent Labs  03/13/13 1751 03/13/13 2129 03/14/13 0636  GLUCAP 169* 136* 125*    Scheduled Meds: . cefTRIAXone (ROCEPHIN)  IV  1 g Intravenous Q24H  . furosemide  80 mg Intravenous BID  . heparin subcutaneous  5,000 Units Subcutaneous Q8H  . hydrALAZINE  37.5 mg Oral Q8H  . insulin aspart  0-9 Units Subcutaneous TID WC  . isosorbide dinitrate  20 mg Oral TID  . pantoprazole  40 mg Oral BID  . sodium bicarbonate  1,300 mg Oral BID  . sodium chloride  3 mL Intravenous Q12H  . sodium chloride  3 mL Intravenous Q12H    Continuous Infusions: . milrinone 0.25 mcg/kg/min (03/13/13 2011)    Past Medical History  Diagnosis Date  . Diabetes mellitus     Reportedly diagnosed 2011 but no medication initiated until  04/2011 (Metformin)  . Hypertension   . Pneumonia   . Diabetic peripheral neuropathy     Since 2013  . CHF (congestive heart failure) 08/2012  . Anemia 08/2012  . UTI (urinary tract infection) 08/22/2012    Past Surgical History  Procedure Laterality Date  . Cesarean section    . Tubal ligation    . Esophagogastroduodenoscopy N/A 03/13/2013    Procedure: ESOPHAGOGASTRODUODENOSCOPY (EGD);  Surgeon: Winfield Cunas., MD;  Location: [facility] ENDOSCOPY;  Service: Endoscopy;  Laterality: N/A;    Molli Barrows, RD, LDN, [place] Pager [phone] After Hours Pager [phone]

## 2013-03-15 ENCOUNTER — Inpatient Hospital Stay (HOSPITAL_COMMUNITY): Payer: Medicaid Other

## 2013-03-15 LAB — BODY FLUID CELL COUNT WITH DIFFERENTIAL
EOS FL: 0 %
LYMPHS FL: 16 %
Monocyte-Macrophage-Serous Fluid: 61 % (ref 50–90)
Neutrophil Count, Fluid: 23 % (ref 0–25)
Total Nucleated Cell Count, Fluid: 136 cu mm (ref 0–1000)

## 2013-03-15 LAB — COMPREHENSIVE METABOLIC PANEL
ALK PHOS: 60 U/L (ref 39–117)
ALT: 16 U/L (ref 0–35)
AST: 27 U/L (ref 0–37)
Albumin: 2.3 g/dL — ABNORMAL LOW (ref 3.5–5.2)
BUN: 41 mg/dL — ABNORMAL HIGH (ref 6–23)
CO2: 26 meq/L (ref 19–32)
Calcium: 8 mg/dL — ABNORMAL LOW (ref 8.4–10.5)
Chloride: 101 mEq/L (ref 96–112)
Creatinine, Ser: 2.48 mg/dL — ABNORMAL HIGH (ref 0.50–1.10)
GFR calc Af Amer: 23 mL/min — ABNORMAL LOW (ref >90)
GFR, EST NON AFRICAN AMERICAN: 20 mL/min — AB (ref >90)
GLUCOSE: 130 mg/dL — AB (ref 70–99)
POTASSIUM: 3.2 meq/L — AB (ref 3.7–5.3)
SODIUM: 139 meq/L (ref 137–147)
TOTAL PROTEIN: 6 g/dL (ref 6.0–8.3)
Total Bilirubin: 0.3 mg/dL (ref 0.3–1.2)

## 2013-03-15 LAB — CBC
HCT: 33.8 % — ABNORMAL LOW (ref 36.0–46.0)
HEMOGLOBIN: 11.5 g/dL — AB (ref 12.0–15.0)
MCH: 30.3 pg (ref 26.0–34.0)
MCHC: 34 g/dL (ref 30.0–36.0)
MCV: 88.9 fL (ref 78.0–100.0)
Platelets: 137 10*3/uL — ABNORMAL LOW (ref 150–400)
RBC: 3.8 MIL/uL — ABNORMAL LOW (ref 3.87–5.11)
RDW: 17.6 % — ABNORMAL HIGH (ref 11.5–15.5)
WBC: 4.5 10*3/uL (ref 4.0–10.5)

## 2013-03-15 LAB — CARBOXYHEMOGLOBIN
CARBOXYHEMOGLOBIN: 2.2 % — AB (ref 0.5–1.5)
Carboxyhemoglobin: 2.3 % — ABNORMAL HIGH (ref 0.5–1.5)
METHEMOGLOBIN: 1.2 % (ref 0.0–1.5)
METHEMOGLOBIN: 1.3 % (ref 0.0–1.5)
O2 Saturation: 82.7 %
O2 Saturation: 76.1 %
Total hemoglobin: 12.2 g/dL (ref 12.0–16.0)
Total hemoglobin: 11.7 g/dL — ABNORMAL LOW (ref 12.0–16.0)

## 2013-03-15 LAB — ALBUMIN, FLUID (OTHER): Albumin, Fluid: 1 g/dL

## 2013-03-15 LAB — GLUCOSE, CAPILLARY
GLUCOSE-CAPILLARY: 147 mg/dL — AB (ref 70–99)
GLUCOSE-CAPILLARY: 207 mg/dL — AB (ref 70–99)
Glucose-Capillary: 117 mg/dL — ABNORMAL HIGH (ref 70–99)
Glucose-Capillary: 185 mg/dL — ABNORMAL HIGH (ref 70–99)

## 2013-03-15 LAB — ANA: Anti Nuclear Antibody(ANA): NEGATIVE

## 2013-03-15 MED ORDER — POTASSIUM CHLORIDE CRYS ER 20 MEQ PO TBCR
60.0000 meq | EXTENDED_RELEASE_TABLET | Freq: Once | ORAL | Status: AC
  Administered 2013-03-15: 60 meq via ORAL
  Filled 2013-03-15: qty 3

## 2013-03-15 MED ORDER — CARVEDILOL 3.125 MG PO TABS
3.1250 mg | ORAL_TABLET | Freq: Two times a day (BID) | ORAL | Status: DC
  Administered 2013-03-15 – 2013-03-17 (×5): 3.125 mg via ORAL
  Filled 2013-03-15 (×7): qty 1

## 2013-03-15 MED ORDER — ISOSORBIDE MONONITRATE ER 60 MG PO TB24
60.0000 mg | ORAL_TABLET | Freq: Every day | ORAL | Status: DC
  Administered 2013-03-15: 60 mg via ORAL
  Filled 2013-03-15 (×2): qty 1

## 2013-03-15 MED ORDER — FUROSEMIDE 40 MG PO TABS
40.0000 mg | ORAL_TABLET | Freq: Two times a day (BID) | ORAL | Status: DC
  Administered 2013-03-16 – 2013-03-19 (×7): 40 mg via ORAL
  Filled 2013-03-15 (×9): qty 1

## 2013-03-15 MED ORDER — HYDRALAZINE HCL 50 MG PO TABS
50.0000 mg | ORAL_TABLET | Freq: Three times a day (TID) | ORAL | Status: DC
  Administered 2013-03-15 – 2013-03-16 (×3): 50 mg via ORAL
  Filled 2013-03-15 (×6): qty 1

## 2013-03-15 MED ORDER — SODIUM CHLORIDE 0.9 % IV SOLN
INTRAVENOUS | Status: DC
  Administered 2013-03-14: 20:00:00 via INTRAVENOUS

## 2013-03-15 NOTE — Progress Notes (Signed)
PT Cancellation Note  Patient Details Name: [name] [name] MRN: [id number] DOB: 1950/03/17   Cancelled Treatment:    Reason Eval/Treat Not Completed: Patient at procedure or test/unavailable (pt in paracentesis currently)   Lanetta Inch [facility] 03/15/2013, 12:18 PM Elwyn Reach, [place]

## 2013-03-15 NOTE — Progress Notes (Signed)
Patient was scheduled for a diagnostic and therapeutic paracentesis, upon reviewing images minimal amount of perihepatic fluid seen, not amendable to percutaneous removal. These results were discussed with the patient's RN, the patient via an interpreter and the patient's daughter over the phone.   Tsosie Billing PA-C Interventional Radiology  03/15/13 11:52 AM

## 2013-03-15 NOTE — Progress Notes (Addendum)
Advanced Heart Failure Rounding Note   Subjective:   63 y/o [name] with h/o poorly controlled DM2, CKD and diastolic HF. Admitted 1/28 with recurrent HF.   Echo showed EF 30% with mod MR/TR. 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 Markedly elevated biventricular filling pressures on RHC with low normal index   RA 15  RV 80/8/30 (??)  PA 75/36 (45)  PCWP 30  LVEDP 31  Fick 3.8/2.6  Themo 3.0/2.2  PVR .9 WU  PA sat 64%  Developed worsening dyspnea, ab pain and renal failure so milrinone started and HF team consulted. CT for ab pain by GI showed ascites/anasarca. EGD ok.  Transferred to stepdown yesterday for central line placement for CVPs and Co-oxs. Co-ox 82% and CVP 4. Remains on milrinone. Going for paracentesis today. 1500 cc out 24/hr. SBP remain 150-190s. Weight stable. Continues to have SOB. Ab pain resolved. SBPs remain 150-170  Cr 1.4-> 3.1->2.7>2.4 (baseline Cr 1.5-2.0. Medico-renal disease on renal u/s)  Objective:   Weight Range:  Vital Signs:   Temp:  [98 F (36.7 C)-98.6 F (37 C)] 98 F (36.7 C) (02/06 0700) Pulse Rate:  [73-85] 83 (02/06 0700) Resp:  [0-19] 19 (02/05 1800) BP: (140-188)/(52-98) 180/73 mmHg (02/06 0700) SpO2:  [93 %-97 %] 97 % (02/06 0700) Weight:  [125 lb 10.6 oz (57 kg)] 125 lb 10.6 oz (57 kg) (02/06 0400) Last BM Date: 03/14/13  Weight change: Filed Weights   03/13/13 0440 03/14/13 0414 03/15/13 0400  Weight: 126 lb 15.8 oz (57.6 kg) 126 lb 1.7 oz (57.2 kg) 125 lb 10.6 oz (57 kg)    Intake/Output:   Intake/Output Summary (Last 24 hours) at 03/15/13 0826 Last data filed at 03/15/13 0600  Gross per 24 hour  Intake 1861.28 ml  Output   1550 ml  Net 311.28 ml     Physical Exam: General: Fatigued ill-appearing. No resp difficulty; lying in bed HEENT: normal  Neck: supple. JVP flat. Carotids 2+ bilat; no bruits. No lymphadenopathy or thryomegaly appreciated.  Cor: PMI  nondisplaced. Regular rate & rhythm. 2/6 MR 2/6 TR  Lungs: clear. Decreased at bases  Abdomen: soft, nontender, non distended. No hepatosplenomegaly. No bruits or masses. Good bowel sounds.  Extremities: warm no cyanosis, clubbing, rash, tr-1+ edema  Neuro: alert & orientedx3, cranial nerves grossly intact. moves all 4 extremities w/o difficulty. Affect pleasant  Telemetry: SR   Labs: Basic Metabolic Panel:  Recent Labs Lab 03/11/13 0544 03/12/13 0352 03/13/13 0250 03/14/13 0443 03/15/13 0500  NA 135* 138 138 139 139  K 4.2 4.0 4.2 3.4* 3.2*  CL 100 102 104 101 101  CO2 21 17* 18* 21 26  GLUCOSE 131* 154* 132* 126* 130*  BUN 46* 42* 42* 43* 41*  CREATININE 3.08* 2.74* 2.78* 2.70* 2.48*  CALCIUM 8.1* 8.3* 8.1* 8.4 8.0*    Liver Function Tests:  Recent Labs Lab 03/10/13 0516 03/12/13 0817 03/13/13 0250 03/14/13 0443 03/15/13 0500  AST 25 21 14 16 27   ALT 13 11 9 9 16   ALKPHOS 70 71 62 63 60  BILITOT 0.4 0.4 0.3 0.4 0.3  PROT 6.7 6.9 6.6 6.9 6.0  ALBUMIN 2.6* 2.7* 2.6* 2.6* 2.3*    Recent Labs Lab 03/12/13 0817  LIPASE 18   No results found for this basename: AMMONIA,  in the last 168 hours  CBC:  Recent Labs Lab 03/09/13 0437 03/10/13 0516 03/11/13 0544 03/13/13 0250 03/14/13 0443 03/15/13 0500  WBC 4.2 5.1 5.8 4.8 4.9 4.5  NEUTROABS 2.7 3.1 3.9  --   --   --   HGB 11.4* 10.8* 10.9* 11.2* 11.7* 11.5*  HCT 34.2* 32.0* 32.8* 33.8* 35.1* 33.8*  MCV 89.8 89.4 89.6 89.9 89.3 88.9  PLT 121* 110* 121* 131* 150 137*    Cardiac Enzymes:  Recent Labs Lab 03/14/13 1710  TROPONINI <0.30    BNP: BNP (last 3 results)  Recent Labs  08/22/12 0930 03/05/13 2204  PROBNP 6331.0* 21512.0*     Imaging: US Abdomen Complete  03/13/2013   CLINICAL DATA:  Diabetes.  Hypertension.  EXAM: ULTRASOUND ABDOMEN COMPLETE  COMPARISON:  MR MRA ABD W/O CM dated 03/12/2013; CT ABD/PELV WO CM dated 03/12/2013  FINDINGS: Gallbladder:  No gallstones or wall thickening  visualized. No sonographic Murphy sign noted.  Common bile duct:  Diameter: 3.2 mm.  Liver:  No focal lesion identified. Within normal limits in parenchymal echogenicity.  IVC:  No abnormality visualized.  Pancreas:  Visualized portion unremarkable.  Spleen:  Size and appearance within normal limits.  Right Kidney:  Length: 8.6 cm. Right kidney is mildly echogenic and atrophic. 8 mm caliceal stone noted.  Left Kidney:  Length: 10.5 cm. Echogenicity within normal limits. No suggest mass or hydronephrosis visualized. 1.3 x 1.1 x 1.1 cm simple cyst. 0.8 by 0.6 x 0.7 cm simple cyst.  Abdominal aorta:  No aneurysm visualized.  Other findings:  Bilateral pleural effusions and ascites present.  IMPRESSION: 1. Mild right renal atrophy.  8 mm calyceal stone right kidney. 2. Bilateral pleural effusion and ascites. 3. No hepatobiliary disease noted.   Electronically Signed   By: [name] [name]  Register   On: 03/13/2013 14:06   Dg Chest Port 1 View  03/14/2013   CLINICAL DATA:  Line placement  EXAM: PORTABLE CHEST - 1 VIEW  COMPARISON:  DG CHEST 2 VIEW dated 03/08/2013  FINDINGS: Right internal jugular vein central venous catheter placed. Tip is at the cavoatrial junction. No pneumothorax.  Moderate bilateral pleural effusions are worse. Cardiomegaly. Bibasilar lung zones are obscured.  IMPRESSION: Right internal jugular vein central venous catheter placed with its tip at the cavoatrial junction and no pneumothorax.  Worsening bilateral pleural effusions.   Electronically Signed   By: [name] [name] M.D.   On: 03/14/2013 19:43     Medications:     Scheduled Medications: . cefTRIAXone (ROCEPHIN)  IV  1 g Intravenous Q24H  . feeding supplement (GLUCERNA SHAKE)  237 mL Oral TID BM  . furosemide  80 mg Intravenous BID  . heparin subcutaneous  5,000 Units Subcutaneous Q8H  . hydrALAZINE  37.5 mg Oral Q8H  . insulin aspart  0-9 Units Subcutaneous TID WC  . isosorbide dinitrate  20 mg Oral TID  . pantoprazole  40 mg Oral BID  .  potassium chloride  40 mEq Oral Once  . sodium bicarbonate  1,300 mg Oral BID  . sodium chloride  3 mL Intravenous Q12H    Infusions: . sodium chloride 5 mL/hr at 03/14/13 2000  . milrinone 0.25 mcg/kg/min (03/14/13 1731)    PRN Medications: hydrALAZINE, HYDROcodone-acetaminophen, morphine injection, ondansetron   Assessment:   1. Acute/chronic systolic HF with probable cardiogenic shock  2. A/c renal failure likely due to cardiorenal syndrome +/- contrast nephropathy  3. Ab pain due to low cardiac output  4. Anasarca  5. 2-V CAD  6. DM2  Plan/Discussion:    Transferred to step-down yesterday for central line placement to monitor CVPs  and co-oxs. Difficult situation, patient's CVP low and co-ox nl on milrinone. Still has quite a bit of fluid on board has anasrca going for paracentesis today. Will stop IV lasix and transition to PO. Increase hydralazine and IMDUR for afterload reduction.   Will start low dose BB, coreg 3.125 mg BID.    Cr slightly improved on milrinone, will continue to follow.   Length of Stay: [name] Hill B NP-C 03/15/2013, 8:26 AM Advanced Heart Failure Team Pager [phone] (M-F; 7a - 4p)  Please contact [facility] for night-coverage after hours (4p -7a ) and weekends on [email or web address]  Patient seen and examined with Junie Bame, NP. We discussed all aspects of the encounter. I agree with the assessment and plan as stated above.   She looks much better today though still c/o dyspnea. CVP down (though weight unchanged), Co-ox looks good on milrinone. Renal function improving (? Shock vs contrast nephropathy). Will hold lasix today - start po tomorrow. Continue milrinone for now. Will go for paracentesis today though I am not sure that she will have much ascites left.   Overall, I am a bit confused as to what happened to her. Symptoms now out of proportion to clinical findings. CVP and co-ox look much better on milrinone but she really hasn't  diuresed. Will have PT see today and try to ambulate. Titrate hydral/NTG and add low dose carvedilol. Wean milrinone slowly over weekend.   Daniel Bensimhon,MD 9:03 AM

## 2013-03-15 NOTE — Progress Notes (Signed)
[facility] PROGRESS NOTE  Menaal Russum CZY:[id number] DOB: Jun 14, 1950 DOA: 03/05/2013 PCP: No PCP Per Patient  Assessment/Plan:  Active Problems:   Acute CHF - Cardiology/Heart Failure team on board and managing. Will defer further management to them at this point. - Currently on milrinone.  - Lasix to be started next am 03/16/13    AKI (acute kidney injury) on chronic kidney disease - Nephrology on board and managing.    Diabetes mellitus, type 2 -Currently on SSI -Blood sugars relatively well controlled on this regimen    CAD - severe 2V at cath 03/08/13 -Cardiology on board   Mild right upper quadrant abdominal pain.  -Liver enzymes are WNL - pain now resolved, no nausea, stable right upper quadrant ultrasound. - GI consulted- patient status post EGD with no findings to explain symptoms. GI suspecting pain related to chronic cardiac problems.  Patient started on Rocephin. Upon review of EMR not clear why.  Her WBC is within normal limits and she is afebrile. As such will plan on discontinuing Rocephin.  Code Status: full Family Communication: No family at bedside  Disposition Plan: Stepdown, PT to evaluate   Consultants:  Cardiology/Heart failure  Nephrology  Gastroenterology  Procedures:  EGD  [place] Abd  MRI Abd w/o contrast  CT Abd w/o contrast  DG Abd   Antibiotics:  Rocephin- 6 days since admission d/c 03/15/13  HPI/Subjective: Patient reports feeling worse today.  No nausea or emesis reported.  Objective: Filed Vitals:   03/15/13 0800  BP: 179/74  Pulse: 83  Temp:   Resp:     Intake/Output Summary (Last 24 hours) at 03/15/13 1013 Last data filed at 03/15/13 0800  Gross per 24 hour  Intake 1879.88 ml  Output   1550 ml  Net 329.88 ml   Filed Weights   03/13/13 0440 03/14/13 0414 03/15/13 0400  Weight: 57.6 kg (126 lb 15.8 oz) 57.2 kg (126 lb 1.7 oz) 57 kg (125 lb 10.6 oz)    Exam:   General:  Pt in NAD, Alert and  awake  Cardiovascular: Normal S1 and S2, no murmurs  Respiratory: Breath sounds heard bilaterally, no wheezes, no increased work of breathing  Abdomen: Soft, non-distended, + bowel sounds  Musculoskeletal: No cyanosis or clubbing.  Data Reviewed: Basic Metabolic Panel:  Recent Labs Lab 03/11/13 0544 03/12/13 0352 03/13/13 0250 03/14/13 0443 03/15/13 0500  NA 135* 138 138 139 139  K 4.2 4.0 4.2 3.4* 3.2*  CL 100 102 104 101 101  CO2 21 17* 18* 21 26  GLUCOSE 131* 154* 132* 126* 130*  BUN 46* 42* 42* 43* 41*  CREATININE 3.08* 2.74* 2.78* 2.70* 2.48*  CALCIUM 8.1* 8.3* 8.1* 8.4 8.0*   Liver Function Tests:  Recent Labs Lab 03/10/13 0516 03/12/13 0817 03/13/13 0250 03/14/13 0443 03/15/13 0500  AST 25 21 14 16 27   ALT 13 11 9 9 16   ALKPHOS 70 71 62 63 60  BILITOT 0.4 0.4 0.3 0.4 0.3  PROT 6.7 6.9 6.6 6.9 6.0  ALBUMIN 2.6* 2.7* 2.6* 2.6* 2.3*    Recent Labs Lab 03/12/13 0817  LIPASE 18   No results found for this basename: AMMONIA,  in the last 168 hours CBC:  Recent Labs Lab 03/09/13 0437 03/10/13 0516 03/11/13 0544 03/13/13 0250 03/14/13 0443 03/15/13 0500  WBC 4.2 5.1 5.8 4.8 4.9 4.5  NEUTROABS 2.7 3.1 3.9  --   --   --   HGB 11.4* 10.8* 10.9* 11.2* 11.7* 11.5*  HCT 34.2*  32.0* 32.8* 33.8* 35.1* 33.8*  MCV 89.8 89.4 89.6 89.9 89.3 88.9  PLT 121* 110* 121* 131* 150 137*   Cardiac Enzymes:  Recent Labs Lab 03/14/13 1710  TROPONINI <0.30   BNP (last 3 results)  Recent Labs  08/22/12 0930 03/05/13 2204  PROBNP 6331.0* 21512.0*   CBG:  Recent Labs Lab 03/13/13 2129 03/14/13 0636 03/14/13 1125 03/14/13 1543 03/14/13 2238  GLUCAP 136* 125* 143* 137* 195*    No results found for this or any previous visit (from the past 240 hour(s)).   Studies: US Abdomen Complete  03/13/2013   CLINICAL DATA:  Diabetes.  Hypertension.  EXAM: ULTRASOUND ABDOMEN COMPLETE  COMPARISON:  MR MRA ABD W/O CM dated 03/12/2013; CT ABD/PELV WO CM dated  03/12/2013  FINDINGS: Gallbladder:  No gallstones or wall thickening visualized. No sonographic Murphy sign noted.  Common bile duct:  Diameter: 3.2 mm.  Liver:  No focal lesion identified. Within normal limits in parenchymal echogenicity.  IVC:  No abnormality visualized.  Pancreas:  Visualized portion unremarkable.  Spleen:  Size and appearance within normal limits.  Right Kidney:  Length: 8.6 cm. Right kidney is mildly echogenic and atrophic. 8 mm caliceal stone noted.  Left Kidney:  Length: 10.5 cm. Echogenicity within normal limits. No suggest mass or hydronephrosis visualized. 1.3 x 1.1 x 1.1 cm simple cyst. 0.8 by 0.6 x 0.7 cm simple cyst.  Abdominal aorta:  No aneurysm visualized.  Other findings:  Bilateral pleural effusions and ascites present.  IMPRESSION: 1. Mild right renal atrophy.  8 mm calyceal stone right kidney. 2. Bilateral pleural effusion and ascites. 3. No hepatobiliary disease noted.   Electronically Signed   By: Marcello Moores  Register   On: 03/13/2013 14:06   Dg Chest Port 1 View  03/14/2013   CLINICAL DATA:  Line placement  EXAM: PORTABLE CHEST - 1 VIEW  COMPARISON:  DG CHEST 2 VIEW dated 03/08/2013  FINDINGS: Right internal jugular vein central venous catheter placed. Tip is at the cavoatrial junction. No pneumothorax.  Moderate bilateral pleural effusions are worse. Cardiomegaly. Bibasilar lung zones are obscured.  IMPRESSION: Right internal jugular vein central venous catheter placed with its tip at the cavoatrial junction and no pneumothorax.  Worsening bilateral pleural effusions.   Electronically Signed   By: Maryclare Bean M.D.   On: 03/14/2013 19:43    Scheduled Meds: . carvedilol  3.125 mg Oral BID WC  . cefTRIAXone (ROCEPHIN)  IV  1 g Intravenous Q24H  . feeding supplement (GLUCERNA SHAKE)  237 mL Oral TID BM  . [START ON 03/16/2013] furosemide  40 mg Oral BID  . heparin subcutaneous  5,000 Units Subcutaneous Q8H  . hydrALAZINE  50 mg Oral Q8H  . insulin aspart  0-9 Units Subcutaneous  TID WC  . isosorbide mononitrate  60 mg Oral Daily  . pantoprazole  40 mg Oral BID  . potassium chloride  40 mEq Oral Once  . sodium bicarbonate  1,300 mg Oral BID  . sodium chloride  3 mL Intravenous Q12H   Continuous Infusions: . sodium chloride 5 mL/hr at 03/14/13 2000  . milrinone 0.25 mcg/kg/min (03/14/13 1731)    Time spent: > 35 minutes    Velvet Bathe  [facility] Pager [phone]. If 7PM-7AM, please contact night-coverage at [email or web address], password [facility] 03/15/2013, 10:13 AM  LOS: 10 days

## 2013-03-15 NOTE — Progress Notes (Signed)
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 rate 19, height 4' 11.06" (1.5 m), weight 57 kg (125 lb 10.6 oz), SpO2 99.00%. Lying in bed, flat, breathing comfortably RRR Lungs CTAB, w/o crackles No rashes/lesions ABD mildly TTP, soft, no r/g Trace LEE b/l Nonfocal Nl mood/affect NCAT  Assessment/Plan 1. 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 Cardiorenal: Cont to follow.  Slowly improving 2. Systolic HF, pulm HTN, Ascites: as above and per cardiology, on milrinone gtt 3. HTN: Stable 4. DM2: 5.   N/V, abd pain: ? Low flow or congestion? Neg EGD and abd [place].  Lipase was WNL  [name] MD 03/15/2013, 9:57 AM   Recent Labs Lab 03/13/13 0250 03/14/13 0443 03/15/13 0500  NA 138 139 139  K 4.2 3.4* 3.2*  CL 104 101 101  CO2 18* 21 26  GLUCOSE 132* 126* 130*  BUN 42* 43* 41*  CREATININE 2.78* 2.70* 2.48*  CALCIUM 8.1* 8.4 8.0*    Recent Labs Lab 03/09/13 0437 03/10/13 0516 03/11/13 0544 03/13/13 0250 03/14/13 0443 03/15/13 0500  WBC 4.2 5.1 5.8 4.8 4.9 4.5  NEUTROABS 2.7 3.1 3.9  --   --   --   HGB 11.4* 10.8* 10.9* 11.2* 11.7* 11.5*  HCT 34.2* 32.0* 32.8* 33.8* 35.1* 33.8*  MCV 89.8 89.4 89.6 89.9 89.3 88.9  PLT 121* 110* 121* 131* 150 137*

## 2013-03-15 NOTE — Procedures (Signed)
Successful US guided right thoracentesis. Yielded 740ml of clear serous fluid. Pt tolerated procedure well. No immediate complications.  Specimen was sent for labs. CXR ordered.  Tsosie Billing D PA-C 03/15/2013 11:48 AM

## 2013-03-16 ENCOUNTER — Ambulatory Visit: Payer: Self-pay

## 2013-03-16 LAB — GLUCOSE, CAPILLARY
GLUCOSE-CAPILLARY: 227 mg/dL — AB (ref 70–99)
GLUCOSE-CAPILLARY: 213 mg/dL — AB (ref 70–99)
Glucose-Capillary: 146 mg/dL — ABNORMAL HIGH (ref 70–99)
Glucose-Capillary: 150 mg/dL — ABNORMAL HIGH (ref 70–99)

## 2013-03-16 LAB — CBC
HCT: 37.1 % (ref 36.0–46.0)
Hemoglobin: 12.5 g/dL (ref 12.0–15.0)
MCH: 30 pg (ref 26.0–34.0)
MCHC: 33.7 g/dL (ref 30.0–36.0)
MCV: 89 fL (ref 78.0–100.0)
Platelets: 145 K/uL — ABNORMAL LOW (ref 150–400)
RBC: 4.17 MIL/uL (ref 3.87–5.11)
RDW: 17.5 % — ABNORMAL HIGH (ref 11.5–15.5)
WBC: 5.2 K/uL (ref 4.0–10.5)

## 2013-03-16 LAB — COMPREHENSIVE METABOLIC PANEL
ALT: 13 U/L (ref 0–35)
AST: 21 U/L (ref 0–37)
Albumin: 2.2 g/dL — ABNORMAL LOW (ref 3.5–5.2)
Alkaline Phosphatase: 58 U/L (ref 39–117)
BILIRUBIN TOTAL: 0.3 mg/dL (ref 0.3–1.2)
BUN: 33 mg/dL — AB (ref 6–23)
CHLORIDE: 97 meq/L (ref 96–112)
CO2: 28 meq/L (ref 19–32)
CREATININE: 2.1 mg/dL — AB (ref 0.50–1.10)
Calcium: 7.9 mg/dL — ABNORMAL LOW (ref 8.4–10.5)
GFR calc Af Amer: 28 mL/min — ABNORMAL LOW (ref >90)
GFR calc non Af Amer: 24 mL/min — ABNORMAL LOW (ref >90)
Glucose, Bld: 132 mg/dL — ABNORMAL HIGH (ref 70–99)
Potassium: 3.6 mEq/L — ABNORMAL LOW (ref 3.7–5.3)
Sodium: 136 mEq/L — ABNORMAL LOW (ref 137–147)
Total Protein: 6 g/dL (ref 6.0–8.3)

## 2013-03-16 LAB — CARBOXYHEMOGLOBIN
Carboxyhemoglobin: 2 % — ABNORMAL HIGH (ref 0.5–1.5)
Methemoglobin: 1.3 % (ref 0.0–1.5)
O2 SAT: 85.4 %
TOTAL HEMOGLOBIN: 12.5 g/dL (ref 12.0–16.0)

## 2013-03-16 MED ORDER — POTASSIUM CHLORIDE CRYS ER 20 MEQ PO TBCR
40.0000 meq | EXTENDED_RELEASE_TABLET | Freq: Once | ORAL | Status: AC
  Administered 2013-03-16: 40 meq via ORAL
  Filled 2013-03-16: qty 2

## 2013-03-16 MED ORDER — HYDRALAZINE HCL 50 MG PO TABS
75.0000 mg | ORAL_TABLET | Freq: Three times a day (TID) | ORAL | Status: DC
  Administered 2013-03-16 – 2013-03-17 (×3): 75 mg via ORAL
  Filled 2013-03-16 (×6): qty 1

## 2013-03-16 MED ORDER — ISOSORBIDE MONONITRATE ER 60 MG PO TB24
90.0000 mg | ORAL_TABLET | Freq: Every day | ORAL | Status: DC
  Administered 2013-03-16: 90 mg via ORAL
  Filled 2013-03-16 (×3): qty 1

## 2013-03-16 NOTE — Progress Notes (Signed)
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 rate 22, height 4' 11.06" (1.5 m), weight 55.4 kg (122 lb 2.2 oz), SpO2 100.00%. Lying in bed, flat, breathing comfortably RRR Lungs CTAB, w/o crackles No rashes/lesions ABD mildly TTP, soft, no r/g Trace LEE b/l Nonfocal Nl mood/affect NCAT  Assessment/Plan 1. 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 Cardiorenal: Improving.  No changes. Daily renal panel 2. Systolic HF, pulm HTN, Ascites: as above and per cardiology, on milrinone gtt; s/p paracentesis 3. HTN: Stable; cardiac meds uptitrated today 4. DM2 5.   N/V, abd pain: s/p GI eval  [name] MD 03/16/2013, 9:05 AM   Recent Labs Lab 03/14/13 0443 03/15/13 0500 03/16/13 0256  NA 139 139 136*  K 3.4* 3.2* 3.6*  CL 101 101 97  CO2 21 26 28   GLUCOSE 126* 130* 132*  BUN 43* 41* 33*  CREATININE 2.70* 2.48* 2.10*  CALCIUM 8.4 8.0* 7.9*    Recent Labs Lab 03/10/13 0516 03/11/13 0544  03/14/13 0443 03/15/13 0500 03/16/13 0256  WBC 5.1 5.8  < > 4.9 4.5 5.2  NEUTROABS 3.1 3.9  --   --   --   --   HGB 10.8* 10.9*  < > 11.7* 11.5* 12.5  HCT 32.0* 32.8*  < > 35.1* 33.8* 37.1  MCV 89.4 89.6  < > 89.3 88.9 89.0  PLT 110* 121*  < > 150 137* 145*  < > = values in this interval not displayed.

## 2013-03-16 NOTE — Progress Notes (Signed)
[facility] PROGRESS NOTE  [name] [name] PIR:[id number] DOB: October 08, 1950 DOA: 03/05/2013 PCP: No PCP Per Patient  Assessment/Plan:  Active Problems:   Acute CHF - Cardiology/Heart Failure team on board and managing. Will defer further management to them at this point. - Currently on milrinone and being weaned.     AKI (acute kidney injury) on chronic kidney disease - Nephrology on board and managing.    Diabetes mellitus, type 2 -Currently on SSI -Blood sugars relatively well controlled on this regimen    CAD - severe 2V at cath 03/08/13 -Cardiology on board   Mild right upper quadrant abdominal pain.  -Liver enzymes are WNL - pain now resolved, no nausea, stable right upper quadrant ultrasound. - GI consulted- patient status post EGD with no findings to explain symptoms.  - GI suspecting pain related to chronic cardiac problems.  Patient started on Rocephin. Upon review of EMR not clear why.  Her WBC is within normal limits and she is afebrile. As such will plan on discontinuing Rocephin.  Code Status: full Family Communication: No family at bedside  Disposition Plan: Stepdown, Disposition per cardiology/nephrology   Consultants:  Cardiology/Heart failure  Nephrology  Gastroenterology  Procedures:  EGD  [place] Abd  MRI Abd w/o contrast  CT Abd w/o contrast  DG Abd   Antibiotics:  Rocephin- 6 days since admission d/c 03/15/13  HPI/Subjective: Patient reports feeling worse today.  No nausea or emesis reported.  Objective: Filed Vitals:   03/16/13 1200  BP: 156/64  Pulse: 78  Temp: 98.5 F (36.9 C)  Resp:     Intake/Output Summary (Last 24 hours) at 03/16/13 1244 Last data filed at 03/16/13 0700  Gross per 24 hour  Intake  466.7 ml  Output   1850 ml  Net -1383.3 ml   Filed Weights   03/14/13 0414 03/15/13 0400 03/16/13 0500  Weight: 57.2 kg (126 lb 1.7 oz) 57 kg (125 lb 10.6 oz) 55.4 kg (122 lb 2.2 oz)    Exam:   General:  Pt in NAD, Alert  and awake  Cardiovascular: Normal S1 and S2, no murmurs  Respiratory: Breath sounds heard bilaterally, no wheezes, no increased work of breathing  Abdomen: Soft, non-distended, + bowel sounds  Musculoskeletal: No cyanosis or clubbing.  Data Reviewed: Basic Metabolic Panel:  Recent Labs Lab 03/12/13 0352 03/13/13 0250 03/14/13 0443 03/15/13 0500 03/16/13 0256  NA 138 138 139 139 136*  K 4.0 4.2 3.4* 3.2* 3.6*  CL 102 104 101 101 97  CO2 17* 18* 21 26 28   GLUCOSE 154* 132* 126* 130* 132*  BUN 42* 42* 43* 41* 33*  CREATININE 2.74* 2.78* 2.70* 2.48* 2.10*  CALCIUM 8.3* 8.1* 8.4 8.0* 7.9*   Liver Function Tests:  Recent Labs Lab 03/12/13 0817 03/13/13 0250 03/14/13 0443 03/15/13 0500 03/16/13 0256  AST 21 14 16 27 21   ALT 11 9 9 16 13   ALKPHOS 71 62 63 60 58  BILITOT 0.4 0.3 0.4 0.3 0.3  PROT 6.9 6.6 6.9 6.0 6.0  ALBUMIN 2.7* 2.6* 2.6* 2.3* 2.2*    Recent Labs Lab 03/12/13 0817  LIPASE 18   No results found for this basename: AMMONIA,  in the last 168 hours CBC:  Recent Labs Lab 03/10/13 0516 03/11/13 0544 03/13/13 0250 03/14/13 0443 03/15/13 0500 03/16/13 0256  WBC 5.1 5.8 4.8 4.9 4.5 5.2  NEUTROABS 3.1 3.9  --   --   --   --   HGB 10.8* 10.9* 11.2* 11.7* 11.5* 12.5  HCT 32.0* 32.8* 33.8* 35.1* 33.8* 37.1  MCV 89.4 89.6 89.9 89.3 88.9 89.0  PLT 110* 121* 131* 150 137* 145*   Cardiac Enzymes:  Recent Labs Lab 03/14/13 1710  TROPONINI <0.30   BNP (last 3 results)  Recent Labs  08/22/12 0930 03/05/13 2204  PROBNP 6331.0* 21512.0*   CBG:  Recent Labs Lab 03/15/13 0705 03/15/13 1246 03/15/13 1558 03/15/13 2058 03/16/13 0712  GLUCAP 117* 185* 147* 207* 146*    Recent Results (from the past 240 hour(s))  BODY FLUID CULTURE     Status: None   Collection Time    03/15/13 11:53 AM      Result Value Range Status   Specimen Description PLEURAL FLUID RIGHT   Final   Special Requests 60ML FLUID   Final   Gram Stain     Final    Value: WBC PRESENT,BOTH PMN AND MONONUCLEAR     NO ORGANISMS SEEN     Performed at [facility]   Culture PENDING   Incomplete   Report Status PENDING   Incomplete     Studies: Dg Chest 1 View  03/15/2013   CLINICAL DATA:  Post right thoracentesis.  EXAM: CHEST - 1 VIEW  COMPARISON:  DG CHEST 1V PORT dated 03/14/2013; DG CHEST 2 VIEW dated 03/08/2013  FINDINGS: Interval resolution of the right pleural effusion post thoracentesis. No right pneumothorax. Stable small left pleural effusion. Resolution of passive atelectasis in the right lower lobe. Stable mild passive atelectasis in the left lower lobe. No new pulmonary parenchymal abnormalities. Stable moderate cardiac enlargement. Pulmonary vascularity normal without evidence of pulmonary edema. Right jugular central venous catheter tip projects at the expected location of the cavoatrial junction, unchanged.  IMPRESSION: 1. Resolution of right pleural effusion post thoracentesis. No right pneumothorax. 2. Resolution of passive atelectasis in the right lower lobe. 3. Stable small left pleural effusion and associated mild passive atelectasis in the left lower lobe. 4. Stable moderate cardiomegaly without evidence of pulmonary edema.   Electronically Signed   By: Evangeline Dakin M.D.   On: 03/15/2013 12:16   US Abdomen Limited  03/15/2013   CLINICAL DATA:  Anasarca, bilateral pleural effusions, question ascites  EXAM: LIMITED ABDOMEN ULTRASOUND FOR ASCITES  TECHNIQUE: Limited ultrasound survey for ascites was performed in all four abdominal quadrants.  COMPARISON:  None  FINDINGS: Bilateral pleural effusions identified.  Survey abdominal imaging of the 4 quadrants shows no evidence of ascites.  IMPRESSION: No evidence of ascites.  Bilateral pleural effusions.   Electronically Signed   By: Lavonia Dana M.D.   On: 03/15/2013 13:29   Dg Chest Port 1 View  03/14/2013   CLINICAL DATA:  Line placement  EXAM: PORTABLE CHEST - 1 VIEW  COMPARISON:  DG CHEST 2  VIEW dated 03/08/2013  FINDINGS: Right internal jugular vein central venous catheter placed. Tip is at the cavoatrial junction. No pneumothorax.  Moderate bilateral pleural effusions are worse. Cardiomegaly. Bibasilar lung zones are obscured.  IMPRESSION: Right internal jugular vein central venous catheter placed with its tip at the cavoatrial junction and no pneumothorax.  Worsening bilateral pleural effusions.   Electronically Signed   By: Maryclare Bean M.D.   On: 03/14/2013 19:43   US Thoracentesis Asp Pleural Space W/img Guide  03/15/2013   CLINICAL DATA:  CHF, right pleural effusion, shortness of breath, request for diagnostic and therapeutic thoracentesis.  EXAM: ULTRASOUND GUIDED right THORACENTESIS  COMPARISON:  None.  FINDINGS: A total of approximately 700 ml of  clear serous fluid was removed. A fluid sample was sent for laboratory analysis.  IMPRESSION: Successful ultrasound guided right thoracentesis yielding 700 ml of pleural fluid.  Read By:  Tsosie Billing PA-C  PROCEDURE: An ultrasound guided thoracentesis was thoroughly discussed with the patient and her daughter and questions answered using an interpreter. The benefits, risks, alternatives and complications were also discussed. The patient understands and wishes to proceed with the procedure. Written consent was obtained verbally by the patient and over the phone consent was obtained by the daughter.  Ultrasound was performed to localize and mark an adequate pocket of fluid in the right chest. The area was then prepped and draped in the normal sterile fashion. 1% Lidocaine was used for local anesthesia. Under ultrasound guidance a 19 gauge Yueh catheter was introduced. Thoracentesis was performed. The catheter was removed and a dressing applied.  Complications:  None.   Electronically Signed   By: Markus Daft M.D.   On: 03/15/2013 11:51    Scheduled Meds: . carvedilol  3.125 mg Oral BID WC  . cefTRIAXone (ROCEPHIN)  IV  1 g Intravenous Q24H  .  feeding supplement (GLUCERNA SHAKE)  237 mL Oral TID BM  . furosemide  40 mg Oral BID  . heparin subcutaneous  5,000 Units Subcutaneous Q8H  . hydrALAZINE  75 mg Oral Q8H  . insulin aspart  0-9 Units Subcutaneous TID WC  . isosorbide mononitrate  90 mg Oral Daily  . pantoprazole  40 mg Oral BID  . sodium bicarbonate  1,300 mg Oral BID  . sodium chloride  3 mL Intravenous Q12H   Continuous Infusions: . sodium chloride 5 mL/hr at 03/14/13 2000  . milrinone 0.125 mcg/kg/min (03/16/13 0945)    Time spent: > 35 minutes    Velvet Bathe  [facility] Pager [phone]. If 7PM-7AM, please contact night-coverage at [email or web address], password [facility] 03/16/2013, 12:44 PM  LOS: 11 days

## 2013-03-16 NOTE — Evaluation (Signed)
Physical Therapy Evaluation Patient Details Name: [name] [name] MRN: [id number] DOB: 04-08-1950 Today's Date: 03/16/2013 Time: 9381-8299 PT Time Calculation (min): 24 min  PT Assessment / Plan / Recommendation History of Present Illness  This is a 63 y.o. year old female with significant past medical history of CHF (ECHO 08/2012 with diffuse hypokinesis, LVH, EF 50-55%, mild aortic regurg, mild-moderate mitral regurg- no formal dx systolic/diastolic dysfunction). Pt initially seen at [facility] for CHF exacerbation 01/2013. Clinically improved with lasix. Lasix had to be held 2/2 renal failure. Per grandson, pt has been off of diuretic for approx  3 weeks. Pt has had progressive cough over past week with worsening LE edema. Was seen at [facility] again today with sxs. Had bilateral chronic pleural effusions with layering on CXR. Was subsequently sent to ER.  Patient with CHF.  Clinical Impression  Patient presents with problems listed below.  Will benefit from acute PT to maximize independence prior to discharge home.  Recommend HHPT at discharge for continued therapy.    PT Assessment  Patient needs continued PT services    Follow Up Recommendations  Home health PT;Supervision/Assistance - 24 hour    Does the patient have the potential to tolerate intense rehabilitation      Barriers to Discharge        Equipment Recommendations  Rolling walker with 5" wheels (May need RW - TBD)    Recommendations for Other Services     Frequency Min 3X/week    Precautions / Restrictions Precautions Precautions: Fall Restrictions Weight Bearing Restrictions: No   Pertinent Vitals/Pain O2 sats remained at 100% with ambulation on O2 at 3 l/min HR at 81 bpm with gait.      Mobility  Transfers Overall transfer level: Needs assistance Equipment used: 1 person hand held assist Transfers: Sit to/from Stand Sit to Stand: Min assist;+2 safety/equipment General transfer comment: Verbal and visual cues for  technique.  Assist for balance/safety. Ambulation/Gait Ambulation/Gait assistance: Min assist;+2 safety/equipment Ambulation Distance (Feet): 86 Feet Assistive device: 1 person hand held assist Gait Pattern/deviations: Step-through pattern;Decreased step length - right;Decreased step length - left;Shuffle Gait velocity: Slow gait speed Gait velocity interpretation: Below normal speed for age/gender General Gait Details: Hand-hold assist for balance/safety.     Exercises     PT Diagnosis: Difficulty walking;Abnormality of gait;Generalized weakness  PT Problem List: Decreased strength;Decreased activity tolerance;Decreased balance;Decreased mobility;Decreased knowledge of use of DME;Cardiopulmonary status limiting activity PT Treatment Interventions: DME instruction;Gait training;Stair training;Functional mobility training;Therapeutic exercise;Patient/family education     PT Goals(Current goals can be found in the care plan section) Acute Rehab PT Goals Patient Stated Goal: To feel better PT Goal Formulation: With patient/family Time For Goal Achievement: 03/23/13 Potential to Achieve Goals: Good  Visit Information  Last PT Received On: 03/16/13 Assistance Needed: +2 (Lines/tubes) History of Present Illness: This is a 63 y.o. year old female with significant past medical history of CHF (ECHO 08/2012 with diffuse hypokinesis, LVH, EF 50-55%, mild aortic regurg, mild-moderate mitral regurg- no formal dx systolic/diastolic dysfunction). Pt [facility] seen [facility] for CHF exacerbation 01/2013. Clinically improved with lasix. Lasix had to be held 2/2 renal failure. Per grandson, pt has been off of diuretic for approx  3 weeks. Pt has had progressive cough over past week with worsening LE edema. Was seen at [facility] again today with sxs. Had bilateral chronic pleural effusions with layering on CXR. Was subsequently sent to ER.  Patient with CHF.       Prior Functioning  Home Living  Family/patient  expects to be discharged to:: Private residence Living Arrangements: Children (Lives with daughter) Available Help at Discharge: Family;Available 24 hours/day Type of Home: House Home Access: Stairs to enter [employer] of Steps: 3 Entrance Stairs-Rails: None Home Layout: One level Home Equipment: None Prior Function Level of Independence: Independent Communication Communication: Prefers language other than English ([name] - son translated)    Cognition  Cognition Arousal/Alertness: Lethargic Behavior During Therapy: Flat affect Overall Cognitive Status: Within Functional Limits for tasks assessed    Extremity/Trunk Assessment Upper Extremity Assessment Upper Extremity Assessment: Generalized weakness Lower Extremity Assessment Lower Extremity Assessment: Generalized weakness Cervical / Trunk Assessment Cervical / Trunk Assessment: Normal   Balance    End of Session PT - End of Session Equipment Utilized During Treatment: Gait belt;Oxygen Activity Tolerance: Patient limited by fatigue Patient left: in chair;with call bell/phone within reach;with family/visitor present Nurse Communication: Mobility status (O2 sats at 100% during gait on O2 at 3 l/min)  GP     Despina Pole 03/16/2013, 2:35 PM Carita Pian. Sanjuana Kava, [place] Pager [phone]

## 2013-03-16 NOTE — Progress Notes (Signed)
Patient ID: [name] [name], female   DOB: Oct 12, 1950, 63 y.o.   MRN: [id number] Advanced Heart Failure Rounding Note   Subjective:   63 y/o [name] with h/o poorly controlled DM2, CKD and diastolic HF. Admitted 1/28 with recurrent HF.   Echo showed EF 30% with mod MR/TR. 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 Markedly elevated biventricular filling pressures on RHC with low normal index   RA 15  RV 80/8/30 (??)  PA 75/36 (45)  PCWP 30  LVEDP 31  Fick 3.8/2.6  Themo 3.0/2.2  PVR .9 WU  PA sat 64%  Developed worsening dyspnea, ab pain and renal failure so milrinone started and HF team consulted. CT for ab pain by GI showed ascites/anasarca. EGD ok.  Transferred to stepdown for central line placement for CVPs and Co-oxs. Co-ox 85% and CVP 4 this morning. Remains on milrinone. SBP 150s-160s.  Paracentesis yesterday with 700 cc out.  Still complains of dyspnea.   Cr 1.4-> 3.1->2.7>2.4>2.1 (baseline Cr 1.5-2.0. Medico-renal disease on renal u/s)  Objective:   Weight Range:  Vital Signs:   Temp:  [97.9 F (36.6 C)-98.6 F (37 C)] 97.9 F (36.6 C) (02/07 0700) Pulse Rate:  [73-85] 76 (02/07 0700) Resp:  [18-24] 22 (02/07 0400) BP: (110-202)/(57-104) 156/71 mmHg (02/07 0700) SpO2:  [99 %-100 %] 100 % (02/07 0700) Weight:  [55.4 kg (122 lb 2.2 oz)] 55.4 kg (122 lb 2.2 oz) (02/07 0500) Last BM Date: 03/14/13  Weight change: Filed Weights   03/14/13 0414 03/15/13 0400 03/16/13 0500  Weight: 57.2 kg (126 lb 1.7 oz) 57 kg (125 lb 10.6 oz) 55.4 kg (122 lb 2.2 oz)    Intake/Output:   Intake/Output Summary (Last 24 hours) at 03/16/13 0852 Last data filed at 03/16/13 0700  Gross per 24 hour  Intake  803.9 ml  Output   2650 ml  Net -1846.1 ml     Physical Exam: General: Fatigued ill-appearing. No resp difficulty; lying in bed HEENT: normal  Neck: supple. JVP flat. Carotids 2+ bilat; no bruits. No lymphadenopathy or  thryomegaly appreciated.  Cor: PMI nondisplaced. Regular rate & rhythm. 2/6 MR 2/6 TR  Lungs: clear. Decreased at bases  Abdomen: soft, nontender, non distended. No hepatosplenomegaly. No bruits or masses. Good bowel sounds.  Extremities: warm no cyanosis, clubbing, rash, tr-1+ edema  Neuro: alert & orientedx3, cranial nerves grossly intact. moves all 4 extremities w/o difficulty. Affect pleasant  Telemetry: SR   Labs: Basic Metabolic Panel:  Recent Labs Lab 03/12/13 0352 03/13/13 0250 03/14/13 0443 03/15/13 0500 03/16/13 0256  NA 138 138 139 139 136*  K 4.0 4.2 3.4* 3.2* 3.6*  CL 102 104 101 101 97  CO2 17* 18* 21 26 28   GLUCOSE 154* 132* 126* 130* 132*  BUN 42* 42* 43* 41* 33*  CREATININE 2.74* 2.78* 2.70* 2.48* 2.10*  CALCIUM 8.3* 8.1* 8.4 8.0* 7.9*    Liver Function Tests:  Recent Labs Lab 03/12/13 0817 03/13/13 0250 03/14/13 0443 03/15/13 0500 03/16/13 0256  AST 21 14 16 27 21   ALT 11 9 9 16 13   ALKPHOS 71 62 63 60 58  BILITOT 0.4 0.3 0.4 0.3 0.3  PROT 6.9 6.6 6.9 6.0 6.0  ALBUMIN 2.7* 2.6* 2.6* 2.3* 2.2*    Recent Labs Lab 03/12/13 0817  LIPASE 18   No results found for this basename: AMMONIA,  in the last 168 hours  CBC:  Recent Labs Lab 03/10/13 0516 03/11/13 0544  03/13/13 0250 03/14/13 0443 03/15/13 0500 03/16/13 0256  WBC 5.1 5.8 4.8 4.9 4.5 5.2  NEUTROABS 3.1 3.9  --   --   --   --   HGB 10.8* 10.9* 11.2* 11.7* 11.5* 12.5  HCT 32.0* 32.8* 33.8* 35.1* 33.8* 37.1  MCV 89.4 89.6 89.9 89.3 88.9 89.0  PLT 110* 121* 131* 150 137* 145*    Cardiac Enzymes:  Recent Labs Lab 03/14/13 1710  TROPONINI <0.30    BNP: BNP (last 3 results)  Recent Labs  08/22/12 0930 03/05/13 2204  PROBNP 6331.0* 21512.0*     Imaging: Dg Chest 1 View  03/15/2013   CLINICAL DATA:  Post right thoracentesis.  EXAM: CHEST - 1 VIEW  COMPARISON:  DG CHEST 1V PORT dated 03/14/2013; DG CHEST 2 VIEW dated 03/08/2013  FINDINGS: Interval resolution of the right  pleural effusion post thoracentesis. No right pneumothorax. Stable small left pleural effusion. Resolution of passive atelectasis in the right lower lobe. Stable mild passive atelectasis in the left lower lobe. No new pulmonary parenchymal abnormalities. Stable moderate cardiac enlargement. Pulmonary vascularity normal without evidence of pulmonary edema. Right jugular central venous catheter tip projects at the expected location of the cavoatrial junction, unchanged.  IMPRESSION: 1. Resolution of right pleural effusion post thoracentesis. No right pneumothorax. 2. Resolution of passive atelectasis in the right lower lobe. 3. Stable small left pleural effusion and associated mild passive atelectasis in the left lower lobe. 4. Stable moderate cardiomegaly without evidence of pulmonary edema.   Electronically Signed   By: Evangeline Dakin M.D.   On: 03/15/2013 12:16   US Abdomen Limited  03/15/2013   CLINICAL DATA:  Anasarca, bilateral pleural effusions, question ascites  EXAM: LIMITED ABDOMEN ULTRASOUND FOR ASCITES  TECHNIQUE: Limited ultrasound survey for ascites was performed in all four abdominal quadrants.  COMPARISON:  None  FINDINGS: Bilateral pleural effusions identified.  Survey abdominal imaging of the 4 quadrants shows no evidence of ascites.  IMPRESSION: No evidence of ascites.  Bilateral pleural effusions.   Electronically Signed   By: Lavonia Dana M.D.   On: 03/15/2013 13:29   Dg Chest Port 1 View  03/14/2013   CLINICAL DATA:  Line placement  EXAM: PORTABLE CHEST - 1 VIEW  COMPARISON:  DG CHEST 2 VIEW dated 03/08/2013  FINDINGS: Right internal jugular vein central venous catheter placed. Tip is at the cavoatrial junction. No pneumothorax.  Moderate bilateral pleural effusions are worse. Cardiomegaly. Bibasilar lung zones are obscured.  IMPRESSION: Right internal jugular vein central venous catheter placed with its tip at the cavoatrial junction and no pneumothorax.  Worsening bilateral pleural  effusions.   Electronically Signed   By: Maryclare Bean M.D.   On: 03/14/2013 19:43   US Thoracentesis Asp Pleural Space W/img Guide  03/15/2013   CLINICAL DATA:  CHF, right pleural effusion, shortness of breath, request for diagnostic and therapeutic thoracentesis.  EXAM: ULTRASOUND GUIDED right THORACENTESIS  COMPARISON:  None.  FINDINGS: A total of approximately 700 ml of clear serous fluid was removed. A fluid sample was sent for laboratory analysis.  IMPRESSION: Successful ultrasound guided right thoracentesis yielding 700 ml of pleural fluid.  Read By:  Tsosie Billing PA-C  PROCEDURE: An ultrasound guided thoracentesis was thoroughly discussed with the patient and her daughter and questions answered using an interpreter. The benefits, risks, alternatives and complications were also discussed. The patient understands and wishes to proceed with the procedure. Written consent was obtained verbally by the patient and over the phone consent  was obtained by the daughter.  Ultrasound was performed to localize and mark an adequate pocket of fluid in the right chest. The area was then prepped and draped in the normal sterile fashion. 1% Lidocaine was used for local anesthesia. Under ultrasound guidance a 19 gauge Yueh catheter was introduced. Thoracentesis was performed. The catheter was removed and a dressing applied.  Complications:  None.   Electronically Signed   By: Markus Daft M.D.   On: 03/15/2013 11:51     Medications:     Scheduled Medications: . carvedilol  3.125 mg Oral BID WC  . cefTRIAXone (ROCEPHIN)  IV  1 g Intravenous Q24H  . feeding supplement (GLUCERNA SHAKE)  237 mL Oral TID BM  . furosemide  40 mg Oral BID  . heparin subcutaneous  5,000 Units Subcutaneous Q8H  . hydrALAZINE  75 mg Oral Q8H  . insulin aspart  0-9 Units Subcutaneous TID WC  . isosorbide mononitrate  90 mg Oral Daily  . pantoprazole  40 mg Oral BID  . potassium chloride  40 mEq Oral Once  . potassium chloride  40 mEq Oral  Once  . sodium bicarbonate  1,300 mg Oral BID  . sodium chloride  3 mL Intravenous Q12H    Infusions: . sodium chloride 5 mL/hr at 03/14/13 2000  . milrinone 0.25 mcg/kg/min (03/16/13 0100)    PRN Medications: hydrALAZINE, HYDROcodone-acetaminophen, morphine injection, ondansetron   Assessment:   1. Acute/chronic systolic HF with probable cardiogenic shock  2. A/c renal failure likely due to cardiorenal syndrome +/- contrast nephropathy  3. Ab pain due to low cardiac output  4. Anasarca  5. 2-V CAD  6. DM2  Plan/Discussion:    Hemodynamically, seems to be doing well with low CVP and good co-ox.  Renal function improving.  Symptoms certainly out of proportion to objective findings.    Today, will wean milrinone to 0.125 mcg/kg/min and will increase hydralazine to 75 mg tid with Imdur 90 daily.  She is resuming po Lasix today.  Would try to titrate oxygen down and get her up to chair.   Kamron Portee,MD 8:52 AM

## 2013-03-17 LAB — CBC
HCT: 32.8 % — ABNORMAL LOW (ref 36.0–46.0)
HEMOGLOBIN: 11 g/dL — AB (ref 12.0–15.0)
MCH: 30.1 pg (ref 26.0–34.0)
MCHC: 33.5 g/dL (ref 30.0–36.0)
MCV: 89.6 fL (ref 78.0–100.0)
Platelets: 120 10*3/uL — ABNORMAL LOW (ref 150–400)
RBC: 3.66 MIL/uL — ABNORMAL LOW (ref 3.87–5.11)
RDW: 17.1 % — AB (ref 11.5–15.5)
WBC: 5.2 10*3/uL (ref 4.0–10.5)

## 2013-03-17 LAB — GLUCOSE, CAPILLARY
GLUCOSE-CAPILLARY: 116 mg/dL — AB (ref 70–99)
Glucose-Capillary: 124 mg/dL — ABNORMAL HIGH (ref 70–99)
Glucose-Capillary: 143 mg/dL — ABNORMAL HIGH (ref 70–99)
Glucose-Capillary: 178 mg/dL — ABNORMAL HIGH (ref 70–99)

## 2013-03-17 LAB — COMPREHENSIVE METABOLIC PANEL
ALBUMIN: 2.1 g/dL — AB (ref 3.5–5.2)
ALT: 14 U/L (ref 0–35)
AST: 22 U/L (ref 0–37)
Alkaline Phosphatase: 55 U/L (ref 39–117)
BUN: 29 mg/dL — ABNORMAL HIGH (ref 6–23)
CO2: 29 mEq/L (ref 19–32)
Calcium: 7.8 mg/dL — ABNORMAL LOW (ref 8.4–10.5)
Chloride: 91 mEq/L — ABNORMAL LOW (ref 96–112)
Creatinine, Ser: 1.74 mg/dL — ABNORMAL HIGH (ref 0.50–1.10)
GFR calc Af Amer: 35 mL/min — ABNORMAL LOW (ref >90)
GFR calc non Af Amer: 30 mL/min — ABNORMAL LOW (ref >90)
Glucose, Bld: 142 mg/dL — ABNORMAL HIGH (ref 70–99)
POTASSIUM: 3.9 meq/L (ref 3.7–5.3)
SODIUM: 130 meq/L — AB (ref 137–147)
TOTAL PROTEIN: 5.7 g/dL — AB (ref 6.0–8.3)
Total Bilirubin: 0.3 mg/dL (ref 0.3–1.2)

## 2013-03-17 LAB — CARBOXYHEMOGLOBIN
Carboxyhemoglobin: 2.3 % — ABNORMAL HIGH (ref 0.5–1.5)
METHEMOGLOBIN: 1.2 % (ref 0.0–1.5)
O2 Saturation: 78.2 %
Total hemoglobin: 11.7 g/dL — ABNORMAL LOW (ref 12.0–16.0)

## 2013-03-17 MED ORDER — SODIUM BICARBONATE 650 MG PO TABS
650.0000 mg | ORAL_TABLET | Freq: Two times a day (BID) | ORAL | Status: DC
  Administered 2013-03-17 – 2013-03-19 (×5): 650 mg via ORAL
  Filled 2013-03-17 (×6): qty 1

## 2013-03-17 MED ORDER — HYDRALAZINE HCL 50 MG PO TABS
100.0000 mg | ORAL_TABLET | Freq: Three times a day (TID) | ORAL | Status: DC
  Administered 2013-03-17 – 2013-03-19 (×6): 100 mg via ORAL
  Filled 2013-03-17 (×9): qty 2

## 2013-03-17 MED ORDER — CARVEDILOL 6.25 MG PO TABS
6.2500 mg | ORAL_TABLET | Freq: Two times a day (BID) | ORAL | Status: DC
  Administered 2013-03-17 – 2013-03-18 (×3): 6.25 mg via ORAL
  Filled 2013-03-17 (×6): qty 1

## 2013-03-17 MED ORDER — ISOSORBIDE MONONITRATE ER 60 MG PO TB24
120.0000 mg | ORAL_TABLET | Freq: Every day | ORAL | Status: DC
Start: 2013-03-17 — End: 2013-03-19
  Administered 2013-03-17 – 2013-03-19 (×3): 120 mg via ORAL
  Filled 2013-03-17 (×3): qty 2

## 2013-03-17 MED ORDER — POTASSIUM CHLORIDE CRYS ER 20 MEQ PO TBCR
20.0000 meq | EXTENDED_RELEASE_TABLET | Freq: Once | ORAL | Status: AC
  Administered 2013-03-17: 20 meq via ORAL
  Filled 2013-03-17: qty 1

## 2013-03-17 NOTE — Progress Notes (Signed)
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 rate 24, height 4' 11.06" (1.5 m), weight 55.3 kg (121 lb 14.6 oz), SpO2 98.00%. Lying in bed, flat, breathing comfortably RRR Lungs CTAB, w/o crackles No rashes/lesions ABD mildly TTP, soft, no r/g Trace LEE b/l Nonfocal Nl mood/affect NCAT  Assessment/Plan 1. 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 Cardiorenal: Improving with time + cardiac support 2. Systolic HF, pulm HTN, Ascites: plan to come off milrinone today, per cardiology. PO diuretics 3. HTN: Stable;  4. DM2 5.   N/V, abd pain: s/p GI eval 6.   Metabolic acidosis, reduce NaHCO3 tabs to 650 BID  Will sign off. Please page with any questions or concerns.    [name] MD 03/17/2013, 9:31 AM   Recent Labs Lab 03/15/13 0500 03/16/13 0256 03/17/13 0330  NA 139 136* 130*  K 3.2* 3.6* 3.9  CL 101 97 91*  CO2 26 28 29   GLUCOSE 130* 132* 142*  BUN 41* 33* 29*  CREATININE 2.48* 2.10* 1.74*  CALCIUM 8.0* 7.9* 7.8*    Recent Labs Lab 03/11/13 0544  03/15/13 0500 03/16/13 0256 03/17/13 0330  WBC 5.8  < > 4.5 5.2 5.2  NEUTROABS 3.9  --   --   --   --   HGB 10.9*  < > 11.5* 12.5 11.0*  HCT 32.8*  < > 33.8* 37.1 32.8*  MCV 89.6  < > 88.9 89.0 89.6  PLT 121*  < > 137* 145* 120*  < > = values in this interval not displayed.

## 2013-03-17 NOTE — Progress Notes (Signed)
[facility] PROGRESS NOTE  [name] [name] MVE:[id number] DOB: 1950-05-02 DOA: 03/05/2013 PCP: No PCP Per Patient  Assessment/Plan:  Active Problems:   Acute CHF - Cardiology/Heart Failure team on board and managing. Will defer further management to them at this point. - Milrinone d/c'd on 2/8 with CHF medication adjustments per cardiology    AKI (acute kidney injury) on chronic kidney disease - Nephrology signing off - improving with time and cardiac support    Diabetes mellitus, type 2 -Currently on SSI -Blood sugars relatively well controlled on this regimen    CAD - severe 2V at cath 03/08/13 -Cardiology on board   Mild right upper quadrant abdominal pain.  - Liver enzymes are WNL - pain now resolved, no nausea, stable right upper quadrant ultrasound. - GI consulted- patient status post EGD with no findings to explain symptoms.  - GI suspecting pain related to chronic cardiac problems.   Code Status: full Family Communication: No family at bedside  Disposition Plan: Stepdown, Disposition per cardiology. If continues to improve anticipate home tomorrow with services   Consultants:  Cardiology/Heart failure  Nephrology  Gastroenterology  Procedures:  EGD  [place] Abd  MRI Abd w/o contrast  CT Abd w/o contrast  US Thoracentesis  Antibiotics:  Rocephin- 6 days since admission d/c 03/17/13  HPI/Subjective: Patient reports feeling better today than yesterday, but still not feeling well.  No nausea or emesis reported.   Objective: Filed Vitals:   03/17/13 1000  BP: 158/129  Pulse: 76  Temp:   Resp:     Intake/Output Summary (Last 24 hours) at 03/17/13 1118 Last data filed at 03/17/13 0800  Gross per 24 hour  Intake 1555.78 ml  Output   1950 ml  Net -394.22 ml   Filed Weights   03/15/13 0400 03/16/13 0500 03/17/13 0500  Weight: 125 lb 10.6 oz (57 kg) 122 lb 2.2 oz (55.4 kg) 121 lb 14.6 oz (55.3 kg)    Exam:   General:  Pt in NAD, Alert and  awake  Cardiovascular: Normal S1 and S2, no murmurs  Respiratory: Breath sounds heard bilaterally, no wheezes, no increased work of breathing  Abdomen: Soft, non-distended, + bowel sounds  Musculoskeletal: No cyanosis or clubbing. No edema  Data Reviewed: Basic Metabolic Panel:  Recent Labs Lab 03/13/13 0250 03/14/13 0443 03/15/13 0500 03/16/13 0256 03/17/13 0330  NA 138 139 139 136* 130*  K 4.2 3.4* 3.2* 3.6* 3.9  CL 104 101 101 97 91*  CO2 18* 21 26 28 29   GLUCOSE 132* 126* 130* 132* 142*  BUN 42* 43* 41* 33* 29*  CREATININE 2.78* 2.70* 2.48* 2.10* 1.74*  CALCIUM 8.1* 8.4 8.0* 7.9* 7.8*   Liver Function Tests:  Recent Labs Lab 03/13/13 0250 03/14/13 0443 03/15/13 0500 03/16/13 0256 03/17/13 0330  AST 14 16 27 21 22   ALT 9 9 16 13 14   ALKPHOS 62 63 60 58 55  BILITOT 0.3 0.4 0.3 0.3 0.3  PROT 6.6 6.9 6.0 6.0 5.7*  ALBUMIN 2.6* 2.6* 2.3* 2.2* 2.1*    Recent Labs Lab 03/12/13 0817  LIPASE 18   No results found for this basename: AMMONIA,  in the last 168 hours CBC:  Recent Labs Lab 03/11/13 0544 03/13/13 0250 03/14/13 0443 03/15/13 0500 03/16/13 0256 03/17/13 0330  WBC 5.8 4.8 4.9 4.5 5.2 5.2  NEUTROABS 3.9  --   --   --   --   --   HGB 10.9* 11.2* 11.7* 11.5* 12.5 11.0*  HCT 32.8* 33.8*  35.1* 33.8* 37.1 32.8*  MCV 89.6 89.9 89.3 88.9 89.0 89.6  PLT 121* 131* 150 137* 145* 120*   Cardiac Enzymes:  Recent Labs Lab 03/14/13 1710  TROPONINI <0.30   BNP (last 3 results)  Recent Labs  08/22/12 0930 03/05/13 2204  PROBNP 6331.0* 21512.0*   CBG:  Recent Labs Lab 03/16/13 0712 03/16/13 1206 03/16/13 1609 03/16/13 2104 03/17/13 0848  GLUCAP 146* 227* 213* 150* 124*    Recent Results (from the past 240 hour(s))  BODY FLUID CULTURE     Status: None   Collection Time    03/15/13 11:53 AM      Result Value Range Status   Specimen Description PLEURAL FLUID RIGHT   Final   Special Requests 60ML FLUID   Final   Gram Stain     Final    Value: WBC PRESENT,BOTH PMN AND MONONUCLEAR     NO ORGANISMS SEEN     Performed at [facility]   Culture     Final   Value: NO GROWTH 1 DAY     Performed at [facility]   Report Status PENDING   Incomplete     Studies: Dg Chest 1 View  03/15/2013   CLINICAL DATA:  Post right thoracentesis.  EXAM: CHEST - 1 VIEW  COMPARISON:  DG CHEST 1V PORT dated 03/14/2013; DG CHEST 2 VIEW dated 03/08/2013  FINDINGS: Interval resolution of the right pleural effusion post thoracentesis. No right pneumothorax. Stable small left pleural effusion. Resolution of passive atelectasis in the right lower lobe. Stable mild passive atelectasis in the left lower lobe. No new pulmonary parenchymal abnormalities. Stable moderate cardiac enlargement. Pulmonary vascularity normal without evidence of pulmonary edema. Right jugular central venous catheter tip projects at the expected location of the cavoatrial junction, unchanged.  IMPRESSION: 1. Resolution of right pleural effusion post thoracentesis. No right pneumothorax. 2. Resolution of passive atelectasis in the right lower lobe. 3. Stable small left pleural effusion and associated mild passive atelectasis in the left lower lobe. 4. Stable moderate cardiomegaly without evidence of pulmonary edema.   Electronically Signed   By: Evangeline Dakin M.D.   On: 03/15/2013 12:16   US Thoracentesis Asp Pleural Space W/img Guide  03/15/2013   CLINICAL DATA:  CHF, right pleural effusion, shortness of breath, request for diagnostic and therapeutic thoracentesis.  EXAM: ULTRASOUND GUIDED right THORACENTESIS  COMPARISON:  None.  FINDINGS: A total of approximately 700 ml of clear serous fluid was removed. A fluid sample was sent for laboratory analysis.  IMPRESSION: Successful ultrasound guided right thoracentesis yielding 700 ml of pleural fluid.  Read By:  Tsosie Billing PA-C  PROCEDURE: An ultrasound guided thoracentesis was thoroughly discussed with the patient and her  daughter and questions answered using an interpreter. The benefits, risks, alternatives and complications were also discussed. The patient understands and wishes to proceed with the procedure. Written consent was obtained verbally by the patient and over the phone consent was obtained by the daughter.  Ultrasound was performed to localize and mark an adequate pocket of fluid in the right chest. The area was then prepped and draped in the normal sterile fashion. 1% Lidocaine was used for local anesthesia. Under ultrasound guidance a 19 gauge Yueh catheter was introduced. Thoracentesis was performed. The catheter was removed and a dressing applied.  Complications:  None.   Electronically Signed   By: Markus Daft M.D.   On: 03/15/2013 11:51    Scheduled Meds: . carvedilol  6.25 mg  Oral BID WC  . cefTRIAXone (ROCEPHIN)  IV  1 g Intravenous Q24H  . feeding supplement (GLUCERNA SHAKE)  237 mL Oral TID BM  . furosemide  40 mg Oral BID  . heparin subcutaneous  5,000 Units Subcutaneous Q8H  . hydrALAZINE  100 mg Oral Q8H  . insulin aspart  0-9 Units Subcutaneous TID WC  . isosorbide mononitrate  120 mg Oral Daily  . pantoprazole  40 mg Oral BID  . sodium bicarbonate  650 mg Oral BID  . sodium chloride  3 mL Intravenous Q12H   Continuous Infusions: . sodium chloride 5 mL/hr at 03/14/13 2000    Time spent: > 35 minutes    Larwance Sachs  [facility] Pager [phone]. If 7PM-7AM, please contact night-coverage at [email or web address], password [facility] 03/17/2013, 11:18 AM  LOS: 12 days

## 2013-03-17 NOTE — Progress Notes (Signed)
Patient ID: [name] [name], female   DOB: [birth year], 63 y.o.   MRN: [id number] female   DOB: [birth year], 63 y.o.   MRN: [id number] Advanced Heart Failure Rounding Note   Subjective:   63 y/o [name] with h/o poorly controlled DM2, CKD and diastolic HF. Admitted 1/28 with recurrent HF.   Echo showed EF 30% with mod MR/TR. 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 Markedly elevated biventricular filling pressures on RHC with low normal index   RA 15  RV 80/8/30 (??)  PA 75/36 (45)  PCWP 30  LVEDP 31  Fick 3.8/2.6  Themo 3.0/2.2  PVR .9 WU  PA sat 64%  Developed worsening dyspnea, ab pain and renal failure so milrinone started and HF team consulted. CT for ab pain by GI showed ascites/anasarca. EGD ok.  Transferred to stepdown for central line placement for CVPs and Co-oxs. Milrinone decreased to 0.125 yesterday.  Co-ox 78% and CVP 7 this morning. SBP 150s-160s.  Paracentesis Friday with 700 cc out.  Walked yesterday with PT, breathing perhaps a bit better.    Cr 1.4-> 3.1->2.7>2.4>2.1>1.74(baseline Cr 1.5-2.0. Medico-renal disease on renal u/s)  Objective:   Weight Range:  Vital Signs:   Temp:  [97.1 F (36.2 C)-98.7 F (37.1 C)] 98.5 F (36.9 C) (02/08 0339) Pulse Rate:  [71-81] 77 (02/08 0600) Resp:  [22-24] 24 (02/08 0000) BP: (127-164)/(58-90) 153/66 mmHg (02/08 0600) SpO2:  [95 %-100 %] 95 % (02/08 0600) Weight:  [55.3 kg (121 lb 14.6 oz)] 55.3 kg (121 lb 14.6 oz) (02/08 0500) Last BM Date: 03/14/13  Weight change: Filed Weights   03/15/13 0400 03/16/13 0500 03/17/13 0500  Weight: 57 kg (125 lb 10.6 oz) 55.4 kg (122 lb 2.2 oz) 55.3 kg (121 lb 14.6 oz)    Intake/Output:   Intake/Output Summary (Last 24 hours) at 03/17/13 0837 Last data filed at 03/17/13 0600  Gross per 24 hour  Intake 1821.38 ml  Output   1700 ml  Net 121.38 ml     Physical Exam: General: Fatigued ill-appearing. No resp difficulty; lying in bed HEENT: normal  Neck: supple. JVP 7-8. Carotids  2+ bilat; no bruits. No lymphadenopathy or thryomegaly appreciated.  Cor: PMI nondisplaced. Regular rate & rhythm. 2/6 MR 2/6 TR  Lungs: clear. Decreased at bases  Abdomen: soft, nontender, non distended. No hepatosplenomegaly. No bruits or masses. Good bowel sounds.  Extremities: warm no cyanosis, clubbing, rash, tr-1+ edema  Neuro: alert & orientedx3, cranial nerves grossly intact. moves all 4 extremities w/o difficulty. Affect pleasant  Telemetry: SR   Labs: Basic Metabolic Panel:  Recent Labs Lab 03/13/13 0250 03/14/13 0443 03/15/13 0500 03/16/13 0256 03/17/13 0330  NA 138 139 139 136* 130*  K 4.2 3.4* 3.2* 3.6* 3.9  CL 104 101 101 97 91*  CO2 18* 21 26 28 29   GLUCOSE 132* 126* 130* 132* 142*  BUN 42* 43* 41* 33* 29*  CREATININE 2.78* 2.70* 2.48* 2.10* 1.74*  CALCIUM 8.1* 8.4 8.0* 7.9* 7.8*    Liver Function Tests:  Recent Labs Lab 03/13/13 0250 03/14/13 0443 03/15/13 0500 03/16/13 0256 03/17/13 0330  AST 14 16 27 21 22   ALT 9 9 16 13 14   ALKPHOS 62 63 60 58 55  BILITOT 0.3 0.4 0.3 0.3 0.3  PROT 6.6 6.9 6.0 6.0 5.7*  ALBUMIN 2.6* 2.6* 2.3* 2.2* 2.1*    Recent Labs Lab 03/12/13 0817  LIPASE 18   No results found for this basename: AMMONIA,  in the last 168 hours  CBC:  Recent Labs Lab 03/11/13 0544 03/13/13 0250 03/14/13 0443 03/15/13 0500 03/16/13 0256 03/17/13 0330  WBC 5.8 4.8 4.9 4.5 5.2 5.2  NEUTROABS 3.9  --   --   --   --   --   HGB 10.9* 11.2* 11.7* 11.5* 12.5 11.0*  HCT 32.8* 33.8* 35.1* 33.8* 37.1 32.8*  MCV 89.6 89.9 89.3 88.9 89.0 89.6  PLT 121* 131* 150 137* 145* 120*    Cardiac Enzymes:  Recent Labs Lab 03/14/13 1710  TROPONINI <0.30    BNP: BNP (last 3 results)  Recent Labs  08/22/12 0930 03/05/13 2204  PROBNP 6331.0* 21512.0*     Imaging: Dg Chest 1 View  03/15/2013   CLINICAL DATA:  Post right thoracentesis.  EXAM: CHEST - 1 VIEW  COMPARISON:  DG CHEST 1V PORT dated 03/14/2013; DG CHEST 2 VIEW dated  03/08/2013  FINDINGS: Interval resolution of the right pleural effusion post thoracentesis. No right pneumothorax. Stable small left pleural effusion. Resolution of passive atelectasis in the right lower lobe. Stable mild passive atelectasis in the left lower lobe. No new pulmonary parenchymal abnormalities. Stable moderate cardiac enlargement. Pulmonary vascularity normal without evidence of pulmonary edema. Right jugular central venous catheter tip projects at the expected location of the cavoatrial junction, unchanged.  IMPRESSION: 1. Resolution of right pleural effusion post thoracentesis. No right pneumothorax. 2. Resolution of passive atelectasis in the right lower lobe. 3. Stable small left pleural effusion and associated mild passive atelectasis in the left lower lobe. 4. Stable moderate cardiomegaly without evidence of pulmonary edema.   Electronically Signed   By: Hulan Saas M.D.   On: 03/15/2013 12:16   US Abdomen Limited  03/15/2013   CLINICAL DATA:  Anasarca, bilateral pleural effusions, question ascites  EXAM: LIMITED ABDOMEN ULTRASOUND FOR ASCITES  TECHNIQUE: Limited ultrasound survey for ascites was performed in all four abdominal quadrants.  COMPARISON:  None  FINDINGS: Bilateral pleural effusions identified.  Survey abdominal imaging of the 4 quadrants shows no evidence of ascites.  IMPRESSION: No evidence of ascites.  Bilateral pleural effusions.   Electronically Signed   By: Ulyses Southward M.D.   On: 03/15/2013 13:29   US Thoracentesis Asp Pleural Space W/img Guide  03/15/2013   CLINICAL DATA:  CHF, right pleural effusion, shortness of breath, request for diagnostic and therapeutic thoracentesis.  EXAM: ULTRASOUND GUIDED right THORACENTESIS  COMPARISON:  None.  FINDINGS: A total of approximately 700 ml of clear serous fluid was removed. A fluid sample was sent for laboratory analysis.  IMPRESSION: Successful ultrasound guided right thoracentesis yielding 700 ml of pleural fluid.  Read By:   Pattricia Boss PA-C  PROCEDURE: An ultrasound guided thoracentesis was thoroughly discussed with the patient and her daughter and questions answered using an interpreter. The benefits, risks, alternatives and complications were also discussed. The patient understands and wishes to proceed with the procedure. Written consent was obtained verbally by the patient and over the phone consent was obtained by the daughter.  Ultrasound was performed to localize and mark an adequate pocket of fluid in the right chest. The area was then prepped and draped in the normal sterile fashion. 1% Lidocaine was used for local anesthesia. Under ultrasound guidance a 19 gauge Yueh catheter was introduced. Thoracentesis was performed. The catheter was removed and a dressing applied.  Complications:  None.   Electronically Signed   By: Richarda Overlie M.D.   On: 03/15/2013 11:51     Medications:     Scheduled Medications: . carvedilol  6.25 mg Oral BID WC  . cefTRIAXone (ROCEPHIN)  IV  1 g Intravenous Q24H  . feeding supplement (GLUCERNA SHAKE)  237 mL Oral TID BM  . furosemide  40 mg Oral BID  . heparin subcutaneous  5,000 Units Subcutaneous Q8H  . hydrALAZINE  100 mg Oral Q8H  . insulin aspart  0-9 Units Subcutaneous TID WC  . isosorbide mononitrate  120 mg Oral Daily  . pantoprazole  40 mg Oral BID  . potassium chloride  20 mEq Oral Once  . sodium bicarbonate  1,300 mg Oral BID  . sodium chloride  3 mL Intravenous Q12H    Infusions: . sodium chloride 5 mL/hr at 03/14/13 2000    PRN Medications: hydrALAZINE, HYDROcodone-acetaminophen, morphine injection, ondansetron   Assessment:   1. Acute/chronic systolic HF  2. A/c renal failure likely due to cardiorenal syndrome +/- contrast nephropathy  3. Ab pain due to low cardiac output  4. Anasarca  5. 2-V CAD  6. DM2  Plan/Discussion:    Hemodynamically, seems to be doing well with stable CVP and good co-ox.  Renal function improving. Symptoms have been out  of proportion to objective findings.    Today, I will stop milrinone and will increase hydralazine to 100 mg tid with Imdur 120 daily.  She is on po Lasix.  Can increase Coreg to 6.25 mg bid.  Would anticipate home with services perhaps tomorrow.   Soul Hackman,MD 8:37 AM

## 2013-03-17 NOTE — Progress Notes (Signed)
Nursing: #14 FR urinary catheter removed without difficulty after internal balloon deflated of 10 ml clear solution. Prior to removal patient was instructed on the importance and need to notify the nurse when she needed to void. Patient demonstrated how to use the call bell and verbalized understanding regarding calling the nurse. After urinary catheter removed the perineal area was cleaned.

## 2013-03-18 DIAGNOSIS — I2510 Atherosclerotic heart disease of native coronary artery without angina pectoris: Secondary | ICD-10-CM

## 2013-03-18 DIAGNOSIS — N179 Acute kidney failure, unspecified: Secondary | ICD-10-CM

## 2013-03-18 DIAGNOSIS — J90 Pleural effusion, not elsewhere classified: Secondary | ICD-10-CM

## 2013-03-18 LAB — CBC
HCT: 34 % — ABNORMAL LOW (ref 36.0–46.0)
Hemoglobin: 11.4 g/dL — ABNORMAL LOW (ref 12.0–15.0)
MCH: 29.9 pg (ref 26.0–34.0)
MCHC: 33.5 g/dL (ref 30.0–36.0)
MCV: 89.2 fL (ref 78.0–100.0)
Platelets: 127 10*3/uL — ABNORMAL LOW (ref 150–400)
RBC: 3.81 MIL/uL — AB (ref 3.87–5.11)
RDW: 17.1 % — ABNORMAL HIGH (ref 11.5–15.5)
WBC: 5.3 10*3/uL (ref 4.0–10.5)

## 2013-03-18 LAB — CARBOXYHEMOGLOBIN
Carboxyhemoglobin: 2.1 % — ABNORMAL HIGH (ref 0.5–1.5)
Methemoglobin: 1.2 % (ref 0.0–1.5)
O2 SAT: 71 %
TOTAL HEMOGLOBIN: 11.6 g/dL — AB (ref 12.0–16.0)

## 2013-03-18 LAB — COMPREHENSIVE METABOLIC PANEL
ALT: 20 U/L (ref 0–35)
AST: 37 U/L (ref 0–37)
Albumin: 2.1 g/dL — ABNORMAL LOW (ref 3.5–5.2)
Alkaline Phosphatase: 55 U/L (ref 39–117)
BUN: 27 mg/dL — AB (ref 6–23)
CO2: 31 meq/L (ref 19–32)
CREATININE: 1.74 mg/dL — AB (ref 0.50–1.10)
Calcium: 7.5 mg/dL — ABNORMAL LOW (ref 8.4–10.5)
Chloride: 88 mEq/L — ABNORMAL LOW (ref 96–112)
GFR, EST AFRICAN AMERICAN: 35 mL/min — AB (ref >90)
GFR, EST NON AFRICAN AMERICAN: 30 mL/min — AB (ref >90)
Glucose, Bld: 129 mg/dL — ABNORMAL HIGH (ref 70–99)
Potassium: 4.8 mEq/L (ref 3.7–5.3)
Sodium: 127 mEq/L — ABNORMAL LOW (ref 137–147)
Total Bilirubin: 0.3 mg/dL (ref 0.3–1.2)
Total Protein: 5.7 g/dL — ABNORMAL LOW (ref 6.0–8.3)

## 2013-03-18 LAB — GLUCOSE, CAPILLARY
GLUCOSE-CAPILLARY: 158 mg/dL — AB (ref 70–99)
GLUCOSE-CAPILLARY: 129 mg/dL — AB (ref 70–99)
Glucose-Capillary: 121 mg/dL — ABNORMAL HIGH (ref 70–99)

## 2013-03-18 LAB — BODY FLUID CULTURE: Culture: NO GROWTH

## 2013-03-18 LAB — PATHOLOGIST SMEAR REVIEW: Path Review: REACTIVE

## 2013-03-18 MED ORDER — SIMVASTATIN 20 MG PO TABS
20.0000 mg | ORAL_TABLET | Freq: Every day | ORAL | Status: DC
  Filled 2013-03-18: qty 1

## 2013-03-18 MED ORDER — ATORVASTATIN CALCIUM 40 MG PO TABS
40.0000 mg | ORAL_TABLET | Freq: Every day | ORAL | Status: DC
  Administered 2013-03-18: 40 mg via ORAL
  Filled 2013-03-18 (×2): qty 1

## 2013-03-18 MED ORDER — ASPIRIN EC 81 MG PO TBEC
81.0000 mg | DELAYED_RELEASE_TABLET | Freq: Every day | ORAL | Status: DC
  Administered 2013-03-18 – 2013-03-19 (×2): 81 mg via ORAL
  Filled 2013-03-18 (×2): qty 1

## 2013-03-18 NOTE — Progress Notes (Signed)
Physical Therapy Treatment/ Discharge Patient Details Name: [name] [name] MRN: [id number] DOB: 1950-09-01 Today's Date: 03/18/2013 Time: 6834-1962 PT Time Calculation (min): 23 min  PT Assessment / Plan / Recommendation  History of Present Illness This is a 63 y.o. year old female with significant past medical history of CHF (ECHO 08/2012 with diffuse hypokinesis, LVH, EF 50-55%, mild aortic regurg, mild-moderate mitral regurg- no formal dx systolic/diastolic dysfunction). Pt initially seen at [facility] for CHF exacerbation 01/2013. Clinically improved with lasix. Lasix had to be held 2/2 renal failure. Per grandson, pt has been off of diuretic for approx  3 weeks. Pt has had progressive cough over past week with worsening LE edema. Was seen at [facility] again today with sxs. Had bilateral chronic pleural effusions with layering on CXR. Was subsequently sent to ER.  Patient with CHF.   PT Comments   Pt with excellent mobility today able to perform transfers and gait without physical assist. Pt states she uses RW at home and only cues were to step into RW. Pt has met all goals and no further acute needs at this time, pt agreeable. Recommend continued daily ambulation with nursing staff.    Follow Up Recommendations  No PT follow up     Does the patient have the potential to tolerate intense rehabilitation     Barriers to Discharge        Equipment Recommendations       Recommendations for Other Services    Frequency     Progress towards PT Goals Progress towards PT goals: Goals met/education completed, patient discharged from PT  Plan Discharge plan needs to be updated    Precautions / Restrictions Precautions Precautions: None Restrictions Weight Bearing Restrictions: No   Pertinent Vitals/Pain HR 68-73 sats 98% on RA No pain    Mobility  Bed Mobility Overal bed mobility: Modified Independent Transfers Overall transfer level: Modified independent Transfers: Sit to/from  Stand Ambulation/Gait Ambulation/Gait assistance: Supervision Ambulation Distance (Feet): 500 Feet Assistive device: Rolling walker (2 wheeled) Gait Pattern/deviations: Step-through pattern;Decreased stride length Gait velocity interpretation: at or above normal speed for age/gender General Gait Details: cues to step into RW only Stairs: Yes Stairs assistance: Modified independent (Device/Increase time) Stair Management: One rail Right;Forwards;Alternating pattern Number of Stairs: 4    Exercises General Exercises - Lower Extremity Long Arc Quad: AROM;Both;Seated;20 reps Hip ABduction/ADduction: AROM;Seated;Both;20 reps Hip Flexion/Marching: AROM;Both;Seated;20 reps Heel Raises: AROM;Both;Seated;20 reps Mini-Sqauts: AROM;Both;Seated;20 reps   PT Diagnosis:    PT Problem List:   PT Treatment Interventions:     PT Goals (current goals can now be found in the care plan section)    Visit Information  Last PT Received On: 03/18/13 Assistance Needed: +1 History of Present Illness: This is a 63 y.o. year old female with significant past medical history of CHF (ECHO 08/2012 with diffuse hypokinesis, LVH, EF 50-55%, mild aortic regurg, mild-moderate mitral regurg- no formal dx systolic/diastolic dysfunction). Pt initially seen at [facility] for CHF exacerbation 01/2013. Clinically improved with lasix. Lasix had to be held 2/2 renal failure. Per grandson, pt has been off of diuretic for approx  3 weeks. Pt has had progressive cough over past week with worsening LE edema. Was seen at [facility] again today with sxs. Had bilateral chronic pleural effusions with layering on CXR. Was subsequently sent to ER.  Patient with CHF.    Subjective Data      Cognition  Cognition Arousal/Alertness: Awake/alert Behavior During Therapy: Flat affect Overall Cognitive Status: Within Functional Limits for  tasks assessed    Balance     End of Session PT - End of Session Activity Tolerance: Patient tolerated  treatment well Patient left: in chair;with call bell/phone within [name];with nursing/sitter in room Nurse Communication: Mobility status   GP     [name] [name] [facility] 03/18/2013, 9:52 AM [name] [name], [place]

## 2013-03-18 NOTE — Progress Notes (Signed)
[facility] PROGRESS NOTE  [name] [name] GDJ:[id number] DOB: 04-16-1950 DOA: 03/05/2013 PCP: No PCP Per Patient  Assessment/Plan:  Active Problems:   Acute CHF - Cardiology/Heart Failure team on board and managing.  - Milrinone d/c'd on 2/8 with CHF medication adjustments per cardiology - Cardiology to add aspirin 81 mg and Lipitor 40 mg by mouth daily    AKI (acute kidney injury) on chronic kidney disease - Nephrology signing off - improving with time and cardiac support    Diabetes mellitus, type 2 -Currently on SSI -Blood sugars relatively well controlled on this regimen    CAD - severe 2V at cath 03/08/13 -Cardiology on board   Mild right upper quadrant abdominal pain.  - Liver enzymes are WNL - pain now resolved, no nausea, stable right upper quadrant ultrasound. - GI consulted- patient status post EGD with no findings to explain symptoms.  - GI suspecting pain related to chronic cardiac problems.  Hyponatremia - Worsening at this juncture. Has gone from 1:30 to 127. Patient reportedly drank increased amounts of fluids. At this point characterize this hypervolemic hyponatremia - Reassess serum sodium levels next a.m. if improved we'll plan on discharging home with cardiology followup   Code Status: full Family Communication: No family at bedside  Disposition Plan: Cleared from cardiology standpoint. Would like to see serum sodium not trend down we'll reassess next a.m. with plans for possible discharge   Consultants:  Cardiology/Heart failure  Nephrology  Gastroenterology  Procedures:  EGD  [place] Abd  MRI Abd w/o contrast  CT Abd w/o contrast  US Thoracentesis  Antibiotics:  Rocephin- 6 days since admission d/c 03/17/13  HPI/Subjective: No new complaints overnight. Patient feels better today.  Objective: Filed Vitals:   03/18/13 0947  BP:   Pulse: 72  Temp:   Resp:     Intake/Output Summary (Last 24 hours) at 03/18/13 1049 Last data filed at  03/18/13 1000  Gross per 24 hour  Intake    900 ml  Output   1550 ml  Net   -650 ml   Filed Weights   03/16/13 0500 03/17/13 0500 03/18/13 0500  Weight: 55.4 kg (122 lb 2.2 oz) 55.3 kg (121 lb 14.6 oz) 55.611 kg (122 lb 9.6 oz)    Exam:   General:  Pt in NAD, Alert and awake  Cardiovascular: Normal S1 and S2, no murmurs  Respiratory: Breath sounds heard bilaterally, no wheezes, no increased work of breathing  Abdomen: Soft, non-distended, + bowel sounds  Musculoskeletal: No cyanosis or clubbing. No edema  Data Reviewed: Basic Metabolic Panel:  Recent Labs Lab 03/14/13 0443 03/15/13 0500 03/16/13 0256 03/17/13 0330 03/18/13 0500  NA 139 139 136* 130* 127*  K 3.4* 3.2* 3.6* 3.9 4.8  CL 101 101 97 91* 88*  CO2 21 26 28 29 31   GLUCOSE 126* 130* 132* 142* 129*  BUN 43* 41* 33* 29* 27*  CREATININE 2.70* 2.48* 2.10* 1.74* 1.74*  CALCIUM 8.4 8.0* 7.9* 7.8* 7.5*   Liver Function Tests:  Recent Labs Lab 03/14/13 0443 03/15/13 0500 03/16/13 0256 03/17/13 0330 03/18/13 0500  AST 16 27 21 22  37  ALT 9 16 13 14 20   ALKPHOS 63 60 58 55 55  BILITOT 0.4 0.3 0.3 0.3 0.3  PROT 6.9 6.0 6.0 5.7* 5.7*  ALBUMIN 2.6* 2.3* 2.2* 2.1* 2.1*    Recent Labs Lab 03/12/13 0817  LIPASE 18   No results found for this basename: AMMONIA,  in the last 168 hours CBC:  Recent Labs Lab 03/14/13 0443 03/15/13 0500 03/16/13 0256 03/17/13 0330 03/18/13 0500  WBC 4.9 4.5 5.2 5.2 5.3  HGB 11.7* 11.5* 12.5 11.0* 11.4*  HCT 35.1* 33.8* 37.1 32.8* 34.0*  MCV 89.3 88.9 89.0 89.6 89.2  PLT 150 137* 145* 120* 127*   Cardiac Enzymes:  Recent Labs Lab 03/14/13 1710  TROPONINI <0.30   BNP (last 3 results)  Recent Labs  08/22/12 0930 03/05/13 2204  PROBNP 6331.0* 21512.0*   CBG:  Recent Labs Lab 03/17/13 0848 03/17/13 1257 03/17/13 1652 03/17/13 2155 03/18/13 0728  GLUCAP 124* 143* 178* 116* 121*    Recent Results (from the past 240 hour(s))  BODY FLUID CULTURE      Status: None   Collection Time    03/15/13 11:53 AM      Result Value Range Status   Specimen Description PLEURAL FLUID RIGHT   Final   Special Requests 60ML FLUID   Final   Gram Stain     Final   Value: WBC PRESENT,BOTH PMN AND MONONUCLEAR     NO ORGANISMS SEEN     Performed at [facility]   Culture     Final   Value: NO GROWTH 2 DAYS     Performed at [facility]   Report Status PENDING   Incomplete     Studies: No results found.  Scheduled Meds: . aspirin EC  81 mg Oral Daily  . atorvastatin  40 mg Oral q1800  . carvedilol  6.25 mg Oral BID WC  . feeding supplement (GLUCERNA SHAKE)  237 mL Oral TID BM  . furosemide  40 mg Oral BID  . heparin subcutaneous  5,000 Units Subcutaneous Q8H  . hydrALAZINE  100 mg Oral Q8H  . insulin aspart  0-9 Units Subcutaneous TID WC  . isosorbide mononitrate  120 mg Oral Daily  . pantoprazole  40 mg Oral BID  . sodium bicarbonate  650 mg Oral BID  . sodium chloride  3 mL Intravenous Q12H   Continuous Infusions: . sodium chloride 5 mL/hr at 03/14/13 2000    Time spent: > 35 minutes    Velvet Bathe  [facility] Pager [phone]. If 7PM-7AM, please contact night-coverage at [email or web address], password [facility] 03/18/2013, 10:49 AM  LOS: 13 days

## 2013-03-18 NOTE — Discharge Instructions (Signed)
Heart Failure Heart failure means your heart has trouble pumping blood. This makes it hard for your body to work well. Heart failure is usually a long-term (chronic) condition. You must take good care of yourself and follow your doctor's treatment plan. HOME CARE  Take your heart medicine as told by your doctor.  Do not stop taking medicine unless your doctor tells you to.  Do not skip any dose of medicine.  Refill your medicines before they run out.  Take other medicines only as told by your doctor or pharmacist.  Stay active if told by your doctor. The elderly and people with severe heart failure should talk with a doctor about physical activity.  Eat heart healthy foods. Choose foods that are without trans fat and are low in saturated fat, cholesterol, and salt (sodium). This includes fresh or frozen fruits and vegetables, fish, lean meats, fat-free or low-fat dairy foods, whole grains, and high-fiber foods. Lentils and dried peas and beans (legumes) are also good choices.  Limit salt if told by your doctor.  Cook in a healthy way. Roast, grill, broil, bake, poach, steam, or stir-fry foods.  Limit fluids as told by your doctor.  Weigh yourself every morning. Do this after you pee (urinate) and before you eat breakfast. Write down your weight to give to your doctor.  Take your blood pressure and write it down if your doctor tell you to.  Ask your doctor how to check your pulse. Check your pulse as told.  Lose weight if told by your doctor.  Stop smoking or chewing tobacco. Do not use gum or patches that help you quit without your doctor's approval.  Schedule and go to doctor visits as told.  Nonpregnant women should have no more than 1 drink a day. Men should have no more than 2 drinks a day. Talk to your doctor about drinking alcohol.  Stop illegal drug use.  Stay current with shots (immunizations).  Manage your health conditions as told by your doctor.  Learn to manage  your stress.  Rest when you are tired.  If it is really hot outside:  Avoid intense activities.  Use air conditioning or fans, or get in a cooler place.  Avoid caffeine and alcohol.  Wear loose-fitting, lightweight, and light-colored clothing.  If it is really cold outside:  Avoid intense activities.  Layer your clothing.  Wear mittens or gloves, a hat, and a scarf when going outside.  Avoid alcohol.  Learn about heart failure and get support as needed.  Get help to maintain or improve your quality of life and your ability to care for yourself as needed. GET HELP IF:   You gain 03 lb/1.4 kg or more in 1 day or 05 lb/2.3 kg in a week.  You are more short of breath than usual.  You cannot do your normal activities.  You tire easily.  You cough more than normal, especially with activity.  You have any or more puffiness (swelling) in areas such as your hands, feet, ankles, or belly (abdomen).  You cannot sleep because it is hard to breathe.  You feel like your heart is beating fast (palpitations).  You get dizzy or lightheaded when you stand up. GET HELP RIGHT AWAY IF:   You have trouble breathing.  There is a change in mental status, such as becoming less alert or not being able to focus.  You have chest pain or discomfort.  You faint. MAKE SURE YOU:   Understand these   instructions.  Will watch your condition.  Will get help right away if you are not doing well or get worse. Document Released: 11/03/2007 Document Revised: 05/21/2012 Document Reviewed: 08/25/2011 [employer] Patient Information 2014 [employer].  

## 2013-03-18 NOTE — Progress Notes (Addendum)
Patient ID: [name] [name], female   DOB: 1951-01-01, 63 y.o.   MRN: [id number] Advanced Heart Failure Rounding Note   Subjective:   63 y/o [name] with h/o poorly controlled DM2, CKD and diastolic HF. Admitted 1/28 with recurrent HF.   Echo showed EF 30% with mod MR/TR. 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 Markedly elevated biventricular filling pressures on RHC with low normal index   03/08/13 RHC/LHC RA 15  RV 80/8/30 (??)  PA 75/36 (45)  PCWP 30  LVEDP 31  Fick 3.8/2.6  Themo 3.0/2.2  PVR .9 WU  PA sat 64%  Developed worsening dyspnea, ab pain and renal failure so milrinone started and HF team consulted. CT for ab pain by GI showed ascites/anasarca. EGD ok.  Transferred to stepdown for central line placement for CVPs and Co-oxs.Paracentesis Friday with 700 cc out.Yesterday milrinone was stopped and hydralazine was increased to 100 mg tid/Imdur 120 mg daily. Yesterday she ambulated in halls without difficulty.   Denies SOB.   Cr 1.74 (baseline Cr 1.5-2.0. Medico-renal disease on renal u/s) CO-OX 71% Objective:   Weight Range:  Vital Signs:   Temp:  [97.2 F (36.2 C)-98.3 F (36.8 C)] 97.8 F (36.6 C) (02/09 0332) Pulse Rate:  [67-78] 67 (02/09 0316) Resp:  [22-24] 22 (02/09 0316) BP: (133-184)/(64-129) 139/64 mmHg (02/09 0316) SpO2:  [97 %-100 %] 99 % (02/09 0316) Weight:  [122 lb 9.6 oz (55.611 kg)] 122 lb 9.6 oz (55.611 kg) (02/09 0500) Last BM Date: 03/14/13  Weight change: Filed Weights   03/16/13 0500 03/17/13 0500 03/18/13 0500  Weight: 122 lb 2.2 oz (55.4 kg) 121 lb 14.6 oz (55.3 kg) 122 lb 9.6 oz (55.611 kg)    Intake/Output:   Intake/Output Summary (Last 24 hours) at 03/18/13 0716 Last data filed at 03/18/13 0700  Gross per 24 hour  Intake  674.4 ml  Output   1600 ml  Net -925.6 ml     Physical Exam: General: NAD. No resp difficulty; lying in bed HEENT: normal  Neck: supple. JVP 5-6.  Carotids 2+ bilat; no bruits. No lymphadenopathy or thryomegaly appreciated. LIJ  Cor: PMI nondisplaced. Regular rate & rhythm. 2/6 MR 2/6 TR  Lungs: clear.  Abdomen: soft, nontender, non distended. No hepatosplenomegaly. No bruits or masses. Good bowel sounds.  Extremities: warm no cyanosis, clubbing, rash, no edema  Neuro: alert & orientedx3, cranial nerves grossly intact. moves all 4 extremities w/o difficulty. Affect pleasant  Telemetry: SR   Labs: Basic Metabolic Panel:  Recent Labs Lab 03/14/13 0443 03/15/13 0500 03/16/13 0256 03/17/13 0330 03/18/13 0500  NA 139 139 136* 130* 127*  K 3.4* 3.2* 3.6* 3.9 4.8  CL 101 101 97 91* 88*  CO2 21 26 28 29 31   GLUCOSE 126* 130* 132* 142* 129*  BUN 43* 41* 33* 29* 27*  CREATININE 2.70* 2.48* 2.10* 1.74* 1.74*  CALCIUM 8.4 8.0* 7.9* 7.8* 7.5*    Liver Function Tests:  Recent Labs Lab 03/14/13 0443 03/15/13 0500 03/16/13 0256 03/17/13 0330 03/18/13 0500  AST 16 27 21 22  37  ALT 9 16 13 14 20   ALKPHOS 63 60 58 55 55  BILITOT 0.4 0.3 0.3 0.3 0.3  PROT 6.9 6.0 6.0 5.7* 5.7*  ALBUMIN 2.6* 2.3* 2.2* 2.1* 2.1*    Recent Labs Lab 03/12/13 0817  LIPASE 18   No results found for this basename: AMMONIA,  in the last 168 hours  CBC:  Recent Labs  Lab 03/14/13 0443 03/15/13 0500 03/16/13 0256 03/17/13 0330 03/18/13 0500  WBC 4.9 4.5 5.2 5.2 5.3  HGB 11.7* 11.5* 12.5 11.0* 11.4*  HCT 35.1* 33.8* 37.1 32.8* 34.0*  MCV 89.3 88.9 89.0 89.6 89.2  PLT 150 137* 145* 120* 127*    Cardiac Enzymes:  Recent Labs Lab 03/14/13 1710  TROPONINI <0.30    BNP: BNP (last 3 results)  Recent Labs  08/22/12 0930 03/05/13 2204  PROBNP 6331.0* 21512.0*     Imaging: No results found.   Medications:     Scheduled Medications: . carvedilol  6.25 mg Oral BID WC  . feeding supplement (GLUCERNA SHAKE)  237 mL Oral TID BM  . furosemide  40 mg Oral BID  . heparin subcutaneous  5,000 Units Subcutaneous Q8H  .  hydrALAZINE  100 mg Oral Q8H  . insulin aspart  0-9 Units Subcutaneous TID WC  . isosorbide mononitrate  120 mg Oral Daily  . pantoprazole  40 mg Oral BID  . sodium bicarbonate  650 mg Oral BID  . sodium chloride  3 mL Intravenous Q12H    Infusions: . sodium chloride 5 mL/hr at 03/14/13 2000    PRN Medications: hydrALAZINE, HYDROcodone-acetaminophen, morphine injection, ondansetron   Assessment:   1. Acute/chronic systolic HF EF 70%  2. A/c renal failure likely due to cardiorenal syndrome +/- contrast nephropathy  3. Ab pain due to low cardiac output  4. Anasarca  5. 2-V CAD  6. DM2  Plan/Discussion:   Volume status stable. Continue 40 mg lasix daily. Continue current dose of carvedilol, hydralazine, Imdur. No ace/spiro due to CKD. Can consider adding spironolactone outpatient. Renal function at baseline (1.7).   Will set up outpatient follow up in HF clinic for next week. Provided a scale for discharge.   2-V CAD . Add baby aspirin and statin. Will add mevacor 20 mg daily.     CLEGG,AMY, NP-C  7:16 AM  Patient seen with NP, agree with the above note.  Patient is stable this morning.  Sodium is lower this morning, she has been drinking a lot of water. No complaints.  Continue current meds, add ASA 81 and atorvastatin 40 mg daily.  Think she can go home with CHF clinic followup.    Loralie Champagne 03/18/2013 8:07 AM

## 2013-03-19 ENCOUNTER — Telehealth: Payer: Self-pay | Admitting: Emergency Medicine

## 2013-03-19 LAB — BASIC METABOLIC PANEL
BUN: 27 mg/dL — ABNORMAL HIGH (ref 6–23)
CO2: 31 meq/L (ref 19–32)
Calcium: 8.1 mg/dL — ABNORMAL LOW (ref 8.4–10.5)
Chloride: 88 mEq/L — ABNORMAL LOW (ref 96–112)
Creatinine, Ser: 1.69 mg/dL — ABNORMAL HIGH (ref 0.50–1.10)
GFR calc Af Amer: 36 mL/min — ABNORMAL LOW (ref >90)
GFR, EST NON AFRICAN AMERICAN: 31 mL/min — AB (ref >90)
Glucose, Bld: 108 mg/dL — ABNORMAL HIGH (ref 70–99)
Potassium: 4.2 mEq/L (ref 3.7–5.3)
Sodium: 129 mEq/L — ABNORMAL LOW (ref 137–147)

## 2013-03-19 LAB — CARBOXYHEMOGLOBIN
Carboxyhemoglobin: 1.8 % — ABNORMAL HIGH (ref 0.5–1.5)
Methemoglobin: 0.7 % (ref 0.0–1.5)
O2 Saturation: 67.9 %
Total hemoglobin: 12.1 g/dL (ref 12.0–16.0)

## 2013-03-19 LAB — MITOCHONDRIAL ANTIBODIES: Mitochondrial M2 Ab, IgG: 0.19 (ref <0.91)

## 2013-03-19 LAB — GLUCOSE, CAPILLARY
Glucose-Capillary: 167 mg/dL — ABNORMAL HIGH (ref 70–99)
Glucose-Capillary: 104 mg/dL — ABNORMAL HIGH (ref 70–99)
Glucose-Capillary: 217 mg/dL — ABNORMAL HIGH (ref 70–99)

## 2013-03-19 MED ORDER — ATORVASTATIN CALCIUM 40 MG PO TABS: 40.0000 mg | ORAL_TABLET | Freq: Every day | ORAL | Status: DC

## 2013-03-19 MED ORDER — CARVEDILOL 12.5 MG PO TABS: 12.5000 mg | ORAL_TABLET | Freq: Two times a day (BID) | ORAL | Status: DC

## 2013-03-19 MED ORDER — SODIUM BICARBONATE 650 MG PO TABS: 650.0000 mg | ORAL_TABLET | Freq: Two times a day (BID) | ORAL | Status: DC

## 2013-03-19 MED ORDER — CARVEDILOL 12.5 MG PO TABS
12.5000 mg | ORAL_TABLET | Freq: Two times a day (BID) | ORAL | Status: DC
  Administered 2013-03-19: 12.5 mg via ORAL
  Filled 2013-03-19 (×3): qty 1

## 2013-03-19 MED ORDER — GLIPIZIDE ER 2.5 MG PO TB24: 2.5000 mg | ORAL_TABLET | Freq: Every day | ORAL | Status: DC

## 2013-03-19 MED ORDER — HYDRALAZINE HCL 100 MG PO TABS: 100.0000 mg | ORAL_TABLET | Freq: Three times a day (TID) | ORAL | Status: DC

## 2013-03-19 MED ORDER — GLIPIZIDE ER 2.5 MG PO TB24
2.5000 mg | ORAL_TABLET | Freq: Every day | ORAL | Status: DC
  Filled 2013-03-19: qty 1

## 2013-03-19 MED ORDER — FUROSEMIDE 40 MG PO TABS: 40.0000 mg | ORAL_TABLET | Freq: Two times a day (BID) | ORAL | Status: DC

## 2013-03-19 MED ORDER — FUROSEMIDE 40 MG PO TABS: 40.0000 mg | ORAL_TABLET | Freq: Every day | ORAL | Status: DC

## 2013-03-19 MED ORDER — ASPIRIN 81 MG PO TBEC: 81.0000 mg | DELAYED_RELEASE_TABLET | Freq: Every day | ORAL | Status: DC

## 2013-03-19 MED ORDER — ISOSORBIDE MONONITRATE ER 120 MG PO TB24: 120.0000 mg | ORAL_TABLET | Freq: Every day | ORAL | Status: DC

## 2013-03-19 MED ORDER — PANTOPRAZOLE SODIUM 40 MG PO TBEC: 40.0000 mg | DELAYED_RELEASE_TABLET | Freq: Two times a day (BID) | ORAL | Status: DC

## 2013-03-19 NOTE — Discharge Summary (Addendum)
Physician Discharge Summary  [name] [name] NWG:[id number] DOB: 10/03/1950 DOA: 03/05/2013  PCP: No PCP Per Patient  Admit date: 03/05/2013 Discharge date: 03/19/2013  Time spent: > 35 minutes  Recommendations for Outpatient Follow-up:  1. Please continue to assess serum creatinine 2. Monitor blood sugars at home.  Follow up with PCP for blood sugar monitoring and further evaluation and recommendations. 3. Please ensure patient follows up with nephrologist and cardiologist  Discharge Diagnoses:  Active Problems:   Acute CHF   AKI (acute kidney injury)   Anemia   Diabetes mellitus, type 2   Pleural effusion, bilateral   CAD - severe 2V at cath 03/08/13   Cardiomyopathy, ischemic- EF 30-35% (new LVD)   NSVT (nonsustained ventricular tachycardia)- 16bts 1/28   Pulmonary hypertension- severe, PA 63 mmHg by echo   Acute on chronic systolic heart failure   Discharge Condition: stable  Diet recommendation: Diabetic diet and heart healthy diet.  Filed Weights   03/17/13 0500 03/18/13 0500 03/19/13 0422  Weight: 55.3 kg (121 lb 14.6 oz) 55.611 kg (122 lb 9.6 oz) 51.1 kg (112 lb 10.5 oz)    History of present illness:  Pt is a 63 y/o [name] with h/o poorly controlled DM2, CKD, and diastolic HF. She presented to the ED with cough and CHF exacerbation.  Hospital Course:  Active Problems:  Acute CHF  - Cardiology/Heart Failure team on board while patient was in house. Per their note: Echo showed EF 30% with mod MR/TR. 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 Markedly elevated biventricular filling pressures on RHC with low normal index  03/08/13 RHC/LHC  RA 15  RV 80/8/30 (??)  PA 75/36 (45)  PCWP 30  LVEDP 31  Fick 3.8/2.6  Themo 3.0/2.2  PVR .9 WU  PA sat 64%  Developed worsening dyspnea, ab pain and renal failure so milrinone started and HF team consulted  - During course of hospital stay patient was transferred to stepdown  for central line placement for CVP's and Co-oxs. Milrinone was discontinued and recommendations by cardiology were for: Continue 40 mg lasix daily. Continue current dose of hydralazine, Imdur. BP continues to run high and co-ox has been good. I will go ahead and increase Coreg to 12.5 mg bid. No ace/spiro due to CKD. Can consider adding spironolactone outpatient. Renal function at baseline.    AKI (acute kidney injury) on chronic kidney disease  - Nephrology evaluated and recommended the following: 1. 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 Cardiorenal: Improving with time + cardiac support Metabolic acidosis, reduce NaHCO3 tabs to 650 BID  Diabetes mellitus, type 2  -Unable to discharge on Metformin given CKD and elevated serum creatinine - Recommend diabetic diet on discharge. With continued blood sugar monitoring. Patient to follow up with PCP for further evaluation and recommendations. Addendum: upon review of chart patient has had elevated blood sugars > 200's.  Will plan on discharging on glipizide.  CAD - severe 2V at cath 03/08/13  - As per recommendations outlined by cardiology. Will continue lipitor and aspirin on discharge.  Mild right upper quadrant abdominal pain.  - Liver enzymes are WNL - pain now resolved, no nausea, stable right upper quadrant ultrasound.  - GI consulted- patient status post EGD with no findings to explain symptoms.  - GI suspecting pain related to chronic cardiac problems.   Hyponatremia  - improved on day of discharge.  - Recommending continued monitoring as outpatient.  - Hypervolemic hyponatremia  Procedures:  As listed above and below. Please see EMR for further details.  Consultations:  Cardiology  Nephrology  GI  Discharge Exam: Filed Vitals:   03/19/13 0800  BP: 156/68  Pulse:   Temp: 98 F (36.7 C)  Resp:     General: Pt in NAD, alert and awake Cardiovascular: RRR, no rubs Respiratory: no increased work of breathing, comfortably on room  air.  Discharge Instructions  Discharge Orders   Future Appointments Provider Department Dept Phone   03/25/2013 10:00 AM [facility] [phone]   04/02/2013 10:15 AM Thayer Headings, MD [facility] [phone]   Future Orders Complete By Expires   (HEART FAILURE PATIENTS) Call MD:  Anytime you have any of the following symptoms: 1) 3 pound weight gain in 24 hours or 5 pounds in 1 week 2) shortness of breath, with or without a dry hacking cough 3) swelling in the hands, feet or stomach 4) if you have to sleep on extra pillows at night in order to breathe.  As directed    Call MD for:  temperature >100.4  As directed    Diet - low sodium heart healthy  As directed    Discharge instructions  As directed    Comments:     Please follow up with your cardiologist in 1-2 weeks or as recommended by your cardiologist per your discussion with him.  Also please follow up with your nephrologist after discharge within the next 2 weeks or sooner should any new concerns arise.   Increase activity slowly  As directed        Medication List    STOP taking these medications       amLODipine 10 MG tablet  Commonly known as:  NORVASC     metFORMIN 500 MG tablet  Commonly known as:  GLUCOPHAGE     OVER THE COUNTER MEDICATION     OVER THE COUNTER MEDICATION      TAKE these medications       aspirin 81 MG EC tablet  Take 1 tablet (81 mg total) by mouth daily.     atorvastatin 40 MG tablet  Commonly known as:  LIPITOR  Take 1 tablet (40 mg total) by mouth daily at 6 PM.     carvedilol 12.5 MG tablet  Commonly known as:  COREG  Take 1 tablet (12.5 mg total) by mouth 2 (two) times daily with a meal.     furosemide 40 MG tablet  Commonly known as:  LASIX  Take 1 tablet (40 mg total) by mouth 2 (two) times daily.     hydrALAZINE 100 MG tablet  Commonly known as:  APRESOLINE  Take 1 tablet (100 mg total) by mouth  every 8 (eight) hours.     isosorbide mononitrate 120 MG 24 hr tablet  Commonly known as:  IMDUR  Take 1 tablet (120 mg total) by mouth daily.     pantoprazole 40 MG tablet  Commonly known as:  PROTONIX  Take 1 tablet (40 mg total) by mouth 2 (two) times daily.     sodium bicarbonate 650 MG tablet  Take 1 tablet (650 mg total) by mouth 2 (two) times daily.       No Known Allergies     Follow-up Information   Follow up with [place]     On 03/16/2013. (10:00 Please bring photo ID and all medications you are taking to  your appt.  )    Contact information:   [place] [address] [phone]      Follow up with Darden Amber., MD On 04/02/2013. (10:15)    Specialty:  Cardiology   Contact information:   [address] [place] [address] [phone]       Follow up with Glori Bickers, MD On 03/25/2013. (at 1000. Garage Code [id number])    Specialty:  Cardiology   Contact information:   [address] [place] [address] [phone]        The results of significant diagnostics from this hospitalization (including imaging, microbiology, ancillary and laboratory) are listed below for reference.    Significant Diagnostic Studies: Ct Abdomen Pelvis Wo Contrast  03/12/2013   CLINICAL DATA:  Abdominal pain. Ischemic colitis and acute renal failure.  EXAM: CT ABDOMEN AND PELVIS WITHOUT CONTRAST  TECHNIQUE: Multidetector CT imaging of the abdomen and pelvis was performed following the standard protocol without intravenous contrast.  COMPARISON:  DG ABD PORTABLE 1V dated 03/12/2013; US RENAL dated 03/10/2013; CT ABD/PELV WO CM dated 03/09/2013  FINDINGS: Right-greater-than-left pleural and spinal pericardial effusions are stable. There is worsening atelectasis at both lung bases. There is worsening generalized anasarca with diffuse subcutaneous and intra-abdominal edema. Ascites has not significantly  changed in volume.  The noncontrast appearance of the liver, spleen, pancreas and adrenal glands is stable. There is a small amount of residual contrast material within the gallbladder lumen. Both kidneys demonstrate persistent delayed nephrograms consistent with acute renal failure. Multifocal renal cortical scarring is present on the right. There are probable small renal calyceal calculi on the right. There is no hydronephrosis.  There is enteric contrast material within the colon which is normal in caliber. There is no colonic wall thickening or pneumatosis. The stomach and small bowel demonstrate no significant findings. There is no extravasated enteric contrast. Aortoiliac atherosclerosis is noted. Vascular assessment is limited without contrast.  The urinary bladder is decompressed by a Foley catheter. Vascular calcifications are present within the uterus. There is no adnexal mass.  There are no worrisome osseous findings.  IMPRESSION: 1. Progressive anasarca with increasing diffuse soft tissue edema. The pleural and pericardial effusions and ascites have not significantly changed in volume. 2. No specific evidence of bowel ischemia on non contrast imaging. 3. Persistent delayed nephrograms consistent with acute renal failure. The right kidney demonstrates multifocal scarring and non obstructive calyceal calculi. 4. Mildly worsened atelectasis at both lung bases.   Electronically Signed   By: Camie Patience M.D.   On: 03/12/2013 12:42   Ct Abdomen Pelvis Wo Contrast  03/09/2013   CLINICAL DATA:  Acute renal failure superimposed upon stage III chronic. Right upper quadrant abdominal pain. Cardiac catheterization yesterday due to new onset systolic heart failure which revealed severe 2 vessel coronary disease (100% right coronary and diffuse 80 is a 95% subtotal occlusion of the second obtuse marginal) and severe pulmonary arterial hypertension.  EXAM: CT ABDOMEN AND PELVIS WITHOUT CONTRAST  TECHNIQUE:  Multidetector CT imaging of the abdomen and pelvis was performed following the standard protocol without intravenous contrast. Oral contrast was administered.  COMPARISON:  DG ABD PORTABLE 1V dated 03/07/2013; NM HEPATOBILIARY INCLUDE GB dated 03/09/2013; US ABDOMEN LIMITED RUQ/ASCITES dated 03/07/2013  FINDINGS: Residual contrast material within the renal parenchyma and the renal collecting system, ureters, and bladder related to the cardiac catheterization yesterday, consistent with the given history of acute renal failure. Heterogeneous appearance  to the renal parenchyma bilaterally, with scarring involving the lower pole of the right kidney. No hydronephrosis. No filling defects within the opacified collecting systems, ureters, or bladder.  High attenuation material within the gallbladder is likely due to spurious excretion of the intravenous and intra-arterial contrast into the bile. Normal unenhanced appearance of the liver, spleen, pancreas, and adrenal glands. Moderate aortoiliofemoral atherosclerosis without aneurysm. No significant lymphadenopathy.  Moderate ascites in the abdomen and pelvis. Stomach relatively decompressed and unremarkable. Normal-appearing small bowel. Scattered diverticula involving the relatively decompressed colon. Appendix not clearly visualized, but no pericecal inflammation.  Uterus atrophic consistent with age, containing dystrophic calcifications. No adnexal masses. Phleboliths in both sides of the pelvis.  Generalized body wall edema. Bone window images demonstrate mild to moderate lower thoracic and lumbar spondylosis. Large right pleural effusion and associated passive atelectasis in the right lower lobe. Small left pleural effusion. Heart markedly enlarged with left ventricular predominance. Small pericardial effusion.  IMPRESSION: 1. No abnormalities are identified to explain right upper quadrant abdominal pain. 2. Residual contrast in the kidneys and in the renal collecting  systems, ureters, and bladder related to the cardiac catheterization yesterday. This would be consistent with the given history of acute renal failure. 3. Heterogeneous residual enhancement of the kidneys; in the proper clinical situation, this could represent pyelonephritis, but likely just represents areas of both kidneys that excreted the contrast more quickly. 4. Scarring involving the lower pole of the right kidney. 5. Moderate ascites. 6. Anasarca. 7. Bilateral pleural effusions, large on the right and small on the left. 8. Small pericardial effusion. 9. Spurious excretion of contrast (from yesterday's cardiac catheterization) into the bile, which can be seen in patients with acute renal failure.   Electronically Signed   By: Evangeline Dakin M.D.   On: 03/09/2013 16:21   Dg Chest 1 View  03/15/2013   CLINICAL DATA:  Post right thoracentesis.  EXAM: CHEST - 1 VIEW  COMPARISON:  DG CHEST 1V PORT dated 03/14/2013; DG CHEST 2 VIEW dated 03/08/2013  FINDINGS: Interval resolution of the right pleural effusion post thoracentesis. No right pneumothorax. Stable small left pleural effusion. Resolution of passive atelectasis in the right lower lobe. Stable mild passive atelectasis in the left lower lobe. No new pulmonary parenchymal abnormalities. Stable moderate cardiac enlargement. Pulmonary vascularity normal without evidence of pulmonary edema. Right jugular central venous catheter tip projects at the expected location of the cavoatrial junction, unchanged.  IMPRESSION: 1. Resolution of right pleural effusion post thoracentesis. No right pneumothorax. 2. Resolution of passive atelectasis in the right lower lobe. 3. Stable small left pleural effusion and associated mild passive atelectasis in the left lower lobe. 4. Stable moderate cardiomegaly without evidence of pulmonary edema.   Electronically Signed   By: Evangeline Dakin M.D.   On: 03/15/2013 12:16   Dg Chest 2 View  03/08/2013   CLINICAL DATA:  Pleural  effusions.  EXAM: CHEST  2 VIEW  COMPARISON:  03/05/2013  FINDINGS: Cardiomegaly and slight pulmonary vascular congestion persist. Bilateral pleural effusions persist, slightly diminished on the left. The fusions are loculated anteriorly and along the fissures. On decubitus views some of the fluid does layer laterally.  IMPRESSION: Slight decrease in the left pleural effusion. Persistent cardiomegaly and pulmonary vascular congestion.   Electronically Signed   By: Rozetta Nunnery M.D.   On: 03/08/2013 07:24   Dg Chest 2 View  03/05/2013   CLINICAL DATA:  Bilateral pleural effusions and cardiomegaly. Cough without CHF.  EXAM: CHEST  2 VIEW  COMPARISON:  01/19/2013.  FINDINGS: Little change in the chest radiograph compared to prior. The cardiopericardial silhouette is enlarged and partially obscured by would appear to be bilateral subpulmonic pleural effusions. There is blunting of both costophrenic angles. The effusions may be loculated anteriorly. The pulmonary vascularity is mildly increased. Aortic arch atherosclerosis.  IMPRESSION: Cardiomegaly. Likely anteriorly loculated bilateral chronic pleural effusions, little changed from 01/19/2013.   Electronically Signed   By: Dereck Ligas M.D.   On: 03/05/2013 20:14   Mr Jodene Nam Abdomen Wo Contrast  03/12/2013   CLINICAL DATA:  Mesenteric ischemia  EXAM: MRA ABDOMEN AND PELVIS WITHOUT CONTRAST  TECHNIQUE: Multiplanar, multiecho pulse sequences of the abdomen and pelvis were obtained WITHOUT intravenous contrast. Angiographic images of abdomen and pelvis were obtained using MRA technique WITHOUT intravenous contrast.  CONTRAST:  Noncontrast study  COMPARISON:  None.  FINDINGS: MRA ABDOMEN FINDINGS  This study is markedly limited secondary to patient motion.  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 Branch vessels of the celiac are grossly patent.  There is long segment 50% narrowing in the  proximal SMA including its origin. The origin of the IMA is obscured. Branch vessels are not clearly visualized.  Single renal arteries are patent.  Aorta is non aneurysmal and patent.  Small bilateral pleural effusions are present. Diffuse anasarca is noted. Free fluid in the abdomen is suspected.  IMPRESSION:  MRA ABDOMEN IMPRESSION  This study is limited secondary to patient motion.  There is 50% narrowing in the proximal SMA and this is not likely to be significant.  There is also narrowing at the origin of the celiac axis, however its origin was obscured.  The IMA was obscured. It is either occluded or was not clearly visualized on this study.   Electronically Signed   By: Maryclare Bean M.D.   On: 03/12/2013 14:50   Nm Hepatobiliary Liver Func  03/09/2013   CLINICAL DATA:  Right upper quadrant pain. Clinical suspicion for cholecystitis.  EXAM: NUCLEAR MEDICINE HEPATOBILIARY IMAGING  TECHNIQUE: Sequential images of the abdomen were obtained out to 60 minutes following intravenous administration of radiopharmaceutical.  COMPARISON:  None.  RADIOPHARMACEUTICALS:  5 mCi Tc-11m Choletec  FINDINGS: Prompt radiopharmaceutical uptake by the liver seen. Liver has a normal appearance.  Prompt biliary excretion of activity is seen. Gallbladder filling is seen initially on the 15 min image, with further filling seen for 60 min. Biliary activity reaches small bowel initially on the 20/5 min image.  IMPRESSION: Normal study.  No evidence of cystic duct or biliary obstruction.   Electronically Signed   By: Earle Gell M.D.   On: 03/09/2013 11:09   US Abdomen Complete  03/13/2013   CLINICAL DATA:  Diabetes.  Hypertension.  EXAM: ULTRASOUND ABDOMEN COMPLETE  COMPARISON:  MR MRA ABD W/O CM dated 03/12/2013; CT ABD/PELV WO CM dated 03/12/2013  FINDINGS: Gallbladder:  No gallstones or wall thickening visualized. No sonographic Murphy sign noted.  Common bile duct:  Diameter: 3.2 mm.  Liver:  No focal lesion identified. Within normal  limits in parenchymal echogenicity.  IVC:  No abnormality visualized.  Pancreas:  Visualized portion unremarkable.  Spleen:  Size and appearance within normal limits.  Right Kidney:  Length: 8.6 cm. Right kidney is mildly echogenic and atrophic. 8 mm caliceal stone noted.  Left Kidney:  Length: 10.5 cm. Echogenicity within normal limits. No suggest mass or hydronephrosis visualized.  1.3 x 1.1 x 1.1 cm simple cyst. 0.8 by 0.6 x 0.7 cm simple cyst.  Abdominal aorta:  No aneurysm visualized.  Other findings:  Bilateral pleural effusions and ascites present.  IMPRESSION: 1. Mild right renal atrophy.  8 mm calyceal stone right kidney. 2. Bilateral pleural effusion and ascites. 3. No hepatobiliary disease noted.   Electronically Signed   By: Marcello Moores  Register   On: 03/13/2013 14:06   Dg Chest Right Decubitus  03/05/2013   CLINICAL DATA:  Evaluate effusions.  EXAM: CHEST - RIGHT DECUBITUS  COMPARISON:  Same day left-sided down radiograph and PA and lateral radiographs of the chest.  FINDINGS: With the right side down, the right pleural effusion layers dependently. The majority of the left pleural effusion layers however there is a portion that persists along the periphery of the left chest wall.  IMPRESSION: Layering right pleural effusion.  Layering and partially loculated left pleural effusion along the left chest wall.   Electronically Signed   By: Carlos Levering M.D.   On: 03/05/2013 22:51   Dg Chest Left Decubitus  03/05/2013   CLINICAL DATA:  Evaluate effusions  EXAM: CHEST - LEFT DECUBITUS  COMPARISON:  03/05/2013 chest radiograph  FINDINGS: Left side down radiograph shows the pleural effusions to layer dependently. No appreciable pneumothorax. Cardiac enlargement. Central vascular congestion. Perihilar consolidation on the right appear  IMPRESSION: Layering pleural effusions with left side down.  Right perihilar vascular congestion and consolidation; pneumonia or atelectasis. Recommend follow up to document  resolution.   Electronically Signed   By: Carlos Levering M.D.   On: 03/05/2013 22:49   US Renal  03/10/2013   CLINICAL DATA:  Acute on stage III chronic kidney disease  EXAM: RENAL/URINARY TRACT ULTRASOUND COMPLETE  COMPARISON:  CT abdomen and pelvis 03/09/2013  FINDINGS: Right Kidney:  Length: 9.7 cm. Normal cortical thickness. Increased cortical echogenicity. No mass, hydronephrosis or shadowing calcification. Minimal perinephric fluid upper pole. Mild ascites.  Left Kidney:  Length: 11.2 cm. Normal cortical thickness. Increased cortical echogenicity. No solid mass or hydronephrosis. Small cysts are identified, 15 x 10 x 12 mm at mid left kidney and 10 x 7 x 9 mm at inferior pole.  Bladder:  Contains minimal urine, decompressed by Foley catheter.  Ascites and bilateral pleural effusions noted.  IMPRESSION: Medical renal disease changes of both kidneys.  Small amount of ascites.  Bilateral pleural effusions.  No evidence of renal mass or hydronephrosis.   Electronically Signed   By: Lavonia Dana M.D.   On: 03/10/2013 11:56   US Abdomen Limited  03/15/2013   CLINICAL DATA:  Anasarca, bilateral pleural effusions, question ascites  EXAM: LIMITED ABDOMEN ULTRASOUND FOR ASCITES  TECHNIQUE: Limited ultrasound survey for ascites was performed in all four abdominal quadrants.  COMPARISON:  None  FINDINGS: Bilateral pleural effusions identified.  Survey abdominal imaging of the 4 quadrants shows no evidence of ascites.  IMPRESSION: No evidence of ascites.  Bilateral pleural effusions.   Electronically Signed   By: Lavonia Dana M.D.   On: 03/15/2013 13:29   Dg Chest Port 1 View  03/14/2013   CLINICAL DATA:  Line placement  EXAM: PORTABLE CHEST - 1 VIEW  COMPARISON:  DG CHEST 2 VIEW dated 03/08/2013  FINDINGS: Right internal jugular vein central venous catheter placed. Tip is at the cavoatrial junction. No pneumothorax.  Moderate bilateral pleural effusions are worse. Cardiomegaly. Bibasilar lung zones are obscured.   IMPRESSION: Right internal jugular vein central venous catheter placed with  its tip at the cavoatrial junction and no pneumothorax.  Worsening bilateral pleural effusions.   Electronically Signed   By: Maryclare Bean M.D.   On: 03/14/2013 19:43   Dg Abd Portable 1v  03/12/2013   CLINICAL DATA:  Mid abdominal discomfort  EXAM: PORTABLE ABDOMEN - 1 VIEW  COMPARISON:  US RENAL dated 03/10/2013; CT ABD/PELV WO CM dated 03/09/2013; DG ABD PORTABLE 1V dated 03/07/2013  FINDINGS: There is mild gaseous distention of the stomach. There are loops of mildly distended gas-filled small bowel in the lower abdomen. There is contrast within normal calibered loops of the colon from the CT scan of 09 March 2013. There are stable calcifications in the left upper quadrant of the abdomen. There are bilateral pleural effusions.  IMPRESSION: The bowel gas pattern is nonspecific. A mild ileus type pattern is suspected. The colon does not appear abnormally distended.   Electronically Signed   By: David  Martinique   On: 03/12/2013 08:51   Dg Abd Portable 1v  03/07/2013   CLINICAL DATA:  Right-sided abdominal pain and emesis.  EXAM: PORTABLE ABDOMEN - 1 VIEW  COMPARISON:  None available for comparison at time of study interpretation.  FINDINGS: Bowel gas pattern is nondilated and nonobstructive. No intra-abdominal mass effect; limited assessment for free air on this supine view. Soft tissue planes and included osseous structures are nonsuspicious. Mild degenerative change of the thoracic spine. Apparent surgical clips in the left upper quadrant versus calcifications.  Included view of the lung bases demonstrates partially imaged pleural effusions.  IMPRESSION: Surgical clips versus calcifications of the left upper quadrant.  Nonspecific bowel gas pattern.   Electronically Signed   By: Elon Alas   On: 03/07/2013 16:02   US Abdomen Limited Ruq  03/07/2013   CLINICAL DATA:  Abdominal pain, diabetes, hypertension  EXAM: US ABDOMEN LIMITED -  RIGHT UPPER QUADRANT  COMPARISON:  None.  FINDINGS: Gallbladder:  Minimal gallbladder wall thickening measuring 3.4 mm. Negative for gallstones or associated Murphy sign. No pericholecystic fluid.  Common bile duct:  Diameter: 3.3 mm  Liver:  No focal lesion identified. Within normal limits in parenchymal echogenicity. Patent portal vein with normal hepatopetal flow.  Additional Large right pleural effusion noted.  IMPRESSION: Minimal gallbladder wall thickening but no associated cholelithiasis or definite Murphy sign. Gallbladder appears remains nonspecific.  No biliary dilatation  Large right pleural effusion   Electronically Signed   By: Daryll Brod M.D.   On: 03/07/2013 21:00   US Thoracentesis Asp Pleural Space W/img Guide  03/15/2013   CLINICAL DATA:  CHF, right pleural effusion, shortness of breath, request for diagnostic and therapeutic thoracentesis.  EXAM: ULTRASOUND GUIDED right THORACENTESIS  COMPARISON:  None.  FINDINGS: A total of approximately 700 ml of clear serous fluid was removed. A fluid sample was sent for laboratory analysis.  IMPRESSION: Successful ultrasound guided right thoracentesis yielding 700 ml of pleural fluid.  Read By:  Tsosie Billing PA-C  PROCEDURE: An ultrasound guided thoracentesis was thoroughly discussed with the patient and her daughter and questions answered using an interpreter. The benefits, risks, alternatives and complications were also discussed. The patient understands and wishes to proceed with the procedure. Written consent was obtained verbally by the patient and over the phone consent was obtained by the daughter.  Ultrasound was performed to localize and mark an adequate pocket of fluid in the right chest. The area was then prepped and draped in the normal sterile fashion. 1% Lidocaine was used for local anesthesia.  Under ultrasound guidance a 19 gauge Yueh catheter was introduced. Thoracentesis was performed. The catheter was removed and a dressing applied.   Complications:  None.   Electronically Signed   By: Markus Daft M.D.   On: 03/15/2013 11:51    Microbiology: Recent Results (from the past 240 hour(s))  BODY FLUID CULTURE     Status: None   Collection Time    03/15/13 11:53 AM      Result Value Range Status   Specimen Description PLEURAL FLUID RIGHT   Final   Special Requests 60ML FLUID   Final   Gram Stain     Final   Value: WBC PRESENT,BOTH PMN AND MONONUCLEAR     NO ORGANISMS SEEN     Performed at [facility]   Culture     Final   Value: NO GROWTH 3 DAYS     Performed at [facility]   Report Status 03/18/2013 FINAL   Final     Labs: Basic Metabolic Panel:  Recent Labs Lab 03/15/13 0500 03/16/13 0256 03/17/13 0330 03/18/13 0500 03/19/13 0445  NA 139 136* 130* 127* 129*  K 3.2* 3.6* 3.9 4.8 4.2  CL 101 97 91* 88* 88*  CO2 26 28 29 31 31   GLUCOSE 130* 132* 142* 129* 108*  BUN 41* 33* 29* 27* 27*  CREATININE 2.48* 2.10* 1.74* 1.74* 1.69*  CALCIUM 8.0* 7.9* 7.8* 7.5* 8.1*   Liver Function Tests:  Recent Labs Lab 03/14/13 0443 03/15/13 0500 03/16/13 0256 03/17/13 0330 03/18/13 0500  AST 16 27 21 22  37  ALT 9 16 13 14 20   ALKPHOS 63 60 58 55 55  BILITOT 0.4 0.3 0.3 0.3 0.3  PROT 6.9 6.0 6.0 5.7* 5.7*  ALBUMIN 2.6* 2.3* 2.2* 2.1* 2.1*   No results found for this basename: LIPASE, AMYLASE,  in the last 168 hours No results found for this basename: AMMONIA,  in the last 168 hours CBC:  Recent Labs Lab 03/14/13 0443 03/15/13 0500 03/16/13 0256 03/17/13 0330 03/18/13 0500  WBC 4.9 4.5 5.2 5.2 5.3  HGB 11.7* 11.5* 12.5 11.0* 11.4*  HCT 35.1* 33.8* 37.1 32.8* 34.0*  MCV 89.3 88.9 89.0 89.6 89.2  PLT 150 137* 145* 120* 127*   Cardiac Enzymes:  Recent Labs Lab 03/14/13 1710  TROPONINI <0.30   BNP: BNP (last 3 results)  Recent Labs  08/22/12 0930 03/05/13 2204  PROBNP 6331.0* 21512.0*   CBG:  Recent Labs Lab 03/18/13 0728 03/18/13 1137 03/18/13 1641 03/18/13 2130  03/19/13 0743  GLUCAP 121* 158* 129* 167* 104*       Signed:  Velvet Bathe  [facility] 03/19/2013, 10:12 AM   '

## 2013-03-19 NOTE — Telephone Encounter (Signed)
RN case manager Celene Skeen called in to reschedule pt appt.  States pt was given appt 03/16/13 but was hospitalized at this time. She needs to be seen for CHF exacerbation Scheduled appt with Dr. Doreene Burke 04/08/13 @ 930 am

## 2013-03-19 NOTE — Progress Notes (Signed)
   CARE MANAGEMENT NOTE 03/19/2013  Patient:  [name] [name], [name] [name]   Account Number:  [id number]  Date Initiated:  03/08/2013  Documentation initiated by:  [name],[name]  Subjective/Objective Assessment:   PT ADM ON 1/27 WITH CHF, PLEURAL EFFUSIONS.  PTA, PT LIVES AT HOME WITH FAMILY.  SHE IS [place]; SPEAKS LITTLE ENGLISH.     Action/Plan:   PT HAS NO PCP;  APPT MADE AT [place] FOR SAT 2/7 AT 10:00.  WILL CONT TO FOLLOW FOR DC NEEDS AS PT PROGRESSES.  MAY BE ELIGIBLE FOR MED ASSIST THROUGH [place] [facility]ATCH PROGRAM.   Anticipated DC Date:  03/19/2013   Anticipated DC Plan:  [place]  CM consult  [facility]      Choice offered to / List presented to:          [facility] arranged  HH-1 RN      [place].   Status of service:  Completed, signed off Medicare Important Message given?   (If response is "NO", the following Medicare IM given date fields will be blank) Date Medicare IM given:   Date Additional Medicare IM given:    Discharge Disposition:  [place]  Per UR Regulation:  Reviewed for med. necessity/level of care/duration of stay  If discussed at [place] of Stay Meetings, dates discussed:   03/12/2013  03/14/2013    Comments:  03/19/2013 1230 NCM spoke to dtr, Pep # [phone]. Provided with information on new appt time for [facility] on 04/08/2013 at 9:30 am. Also she can pick up meds at discount rate. Pt states her brother was going to pay for meds. Contacted [facility] for [facility] RN and provided dtr's contact info. Jonnie Finner [place] Case Mgmt phone [phone]  03/14/13 [name] AMERSON,RN,BSN [phone] PT TRANSFERRED TO SDU;  ON IV MILRINONE DRIP.

## 2013-03-19 NOTE — Progress Notes (Signed)
Patient ID: [name] [name], female   DOB: Aug 12, 1950, 63 y.o.   MRN: [id number] Advanced Heart Failure Rounding Note   Subjective:   63 y/o [name] with h/o poorly controlled DM2, CKD and diastolic HF. Admitted 1/28 with recurrent HF.   Echo showed EF 30% with mod MR/TR. 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 Markedly elevated biventricular filling pressures on RHC with low normal index   03/08/13 RHC/LHC RA 15  RV 80/8/30 (??)  PA 75/36 (45)  PCWP 30  LVEDP 31  Fick 3.8/2.6  Themo 3.0/2.2  PVR .9 WU  PA sat 64%  Developed worsening dyspnea, ab pain and renal failure so milrinone started and HF team consulted. CT for ab pain by GI showed ascites/anasarca. EGD ok.  Transferred to stepdown for central line placement for CVPs and Co-oxs.Paracentesis Friday with 700 cc out. Sunday milrinone was stopped and hydralazine was increased to 100 mg tid/Imdur 120 mg daily. She is ambulating in the halls without difficulty.   Denies SOB.   She was not discharged yesterday because sodium was down to 127.  Now back to 129.  She had been drinking a lot of fluid.    Objective:   Weight Range:  Vital Signs:   Temp:  [97.3 F (36.3 C)-98.7 F (37.1 C)] 97.3 F (36.3 C) (02/10 0422) Pulse Rate:  [72] 72 (02/09 0947) Resp:  [18-22] 22 (02/09 1635) BP: (122-159)/(63-78) 147/76 mmHg (02/10 0355) SpO2:  [95 %-98 %] 96 % (02/10 0422) Weight:  [51.1 kg (112 lb 10.5 oz)] 51.1 kg (112 lb 10.5 oz) (02/10 0422) Last BM Date: 03/18/13  Weight change: Filed Weights   03/17/13 0500 03/18/13 0500 03/19/13 0422  Weight: 55.3 kg (121 lb 14.6 oz) 55.611 kg (122 lb 9.6 oz) 51.1 kg (112 lb 10.5 oz)    Intake/Output:   Intake/Output Summary (Last 24 hours) at 03/19/13 0744 Last data filed at 03/19/13 0500  Gross per 24 hour  Intake    340 ml  Output   1200 ml  Net   -860 ml     Physical Exam: General: NAD. No resp difficulty; lying in bed HEENT:  normal  Neck: supple. JVP 5-6. Carotids 2+ bilat; no bruits. No lymphadenopathy or thryomegaly appreciated. LIJ  Cor: PMI nondisplaced. Regular rate & rhythm. 2/6 MR 2/6 TR  Lungs: clear.  Abdomen: soft, nontender, non distended. No hepatosplenomegaly. No bruits or masses. Good bowel sounds.  Extremities: warm no cyanosis, clubbing, rash, no edema  Neuro: alert & orientedx3, cranial nerves grossly intact. moves all 4 extremities w/o difficulty. Affect pleasant  Telemetry: SR   Labs: Basic Metabolic Panel:  Recent Labs Lab 03/15/13 0500 03/16/13 0256 03/17/13 0330 03/18/13 0500 03/19/13 0445  NA 139 136* 130* 127* 129*  K 3.2* 3.6* 3.9 4.8 4.2  CL 101 97 91* 88* 88*  CO2 26 28 29 31 31   GLUCOSE 130* 132* 142* 129* 108*  BUN 41* 33* 29* 27* 27*  CREATININE 2.48* 2.10* 1.74* 1.74* 1.69*  CALCIUM 8.0* 7.9* 7.8* 7.5* 8.1*    Liver Function Tests:  Recent Labs Lab 03/14/13 0443 03/15/13 0500 03/16/13 0256 03/17/13 0330 03/18/13 0500  AST 16 27 21 22  37  ALT 9 16 13 14 20   ALKPHOS 63 60 58 55 55  BILITOT 0.4 0.3 0.3 0.3 0.3  PROT 6.9 6.0 6.0 5.7* 5.7*  ALBUMIN 2.6* 2.3* 2.2* 2.1* 2.1*    Recent Labs Lab 03/12/13 [phone]  LIPASE 18   No results found for this basename: AMMONIA,  in the last 168 hours  CBC:  Recent Labs Lab 03/14/13 0443 03/15/13 0500 03/16/13 0256 03/17/13 0330 03/18/13 0500  WBC 4.9 4.5 5.2 5.2 5.3  HGB 11.7* 11.5* 12.5 11.0* 11.4*  HCT 35.1* 33.8* 37.1 32.8* 34.0*  MCV 89.3 88.9 89.0 89.6 89.2  PLT 150 137* 145* 120* 127*    Cardiac Enzymes:  Recent Labs Lab 03/14/13 1710  TROPONINI <0.30    BNP: BNP (last 3 results)  Recent Labs  08/22/12 0930 03/05/13 2204  PROBNP 6331.0* 21512.0*     Imaging: No results found.   Medications:     Scheduled Medications: . aspirin EC  81 mg Oral Daily  . atorvastatin  40 mg Oral q1800  . carvedilol  12.5 mg Oral BID WC  . feeding supplement (GLUCERNA SHAKE)  237 mL Oral TID  BM  . furosemide  40 mg Oral BID  . heparin subcutaneous  5,000 Units Subcutaneous Q8H  . hydrALAZINE  100 mg Oral Q8H  . insulin aspart  0-9 Units Subcutaneous TID WC  . isosorbide mononitrate  120 mg Oral Daily  . pantoprazole  40 mg Oral BID  . sodium bicarbonate  650 mg Oral BID  . sodium chloride  3 mL Intravenous Q12H    Infusions: . sodium chloride 5 mL/hr at 03/14/13 2000    PRN Medications: hydrALAZINE, HYDROcodone-acetaminophen, morphine injection, ondansetron   Assessment:   1. Acute/chronic systolic HF EF 57%  2. A/c renal failure likely due to cardiorenal syndrome +/- contrast nephropathy  3. Ab pain due to low cardiac output  4. Anasarca  5. 2-V CAD  6. DM2  Plan/Discussion:   Volume status stable. Continue 40 mg lasix daily. Continue current dose of hydralazine, Imdur. BP continues to run high and co-ox has been good.  I will go ahead and increase Coreg to 12.5 mg bid.  No ace/spiro due to CKD. Can consider adding spironolactone outpatient. Renal function at baseline.    Will set up outpatient follow up in HF clinic for next week. Provided a scale for discharge.   2-V CAD . Added baby aspirin and statin.   She cut back on fluid intake yesterday and sodium is rising.  I think she is appropriate for discharge.   Continue her current cardiac regimen for discharge.     Loralie Champagne 03/19/2013 7:46 AM

## 2013-03-19 NOTE — Discharge Planning (Addendum)
Patient given D/C instructions, daughter at bedside. Verbalizes understanding. Central line d/c'd catheter tip intact. Patient currently lying flat for 15 minutes s/p removal. Scripts given to patients daughter. Will d/c to private vehicle via wheelchair after bedrest is completed.

## 2013-03-25 ENCOUNTER — Inpatient Hospital Stay (HOSPITAL_COMMUNITY): Admit: 2013-03-25 | Payer: Self-pay

## 2013-03-25 ENCOUNTER — Encounter (HOSPITAL_COMMUNITY): Payer: Self-pay

## 2013-04-01 ENCOUNTER — Encounter: Payer: Self-pay | Admitting: *Deleted

## 2013-04-02 ENCOUNTER — Encounter (INDEPENDENT_AMBULATORY_CARE_PROVIDER_SITE_OTHER): Payer: Self-pay

## 2013-04-02 ENCOUNTER — Encounter: Payer: Self-pay | Admitting: Cardiovascular Disease

## 2013-04-02 ENCOUNTER — Ambulatory Visit (INDEPENDENT_AMBULATORY_CARE_PROVIDER_SITE_OTHER): Payer: Medicaid Other | Admitting: Cardiovascular Disease

## 2013-04-02 VITALS — BP 130/69 | HR 83 | Ht 59.0 in | Wt 124.0 lb

## 2013-04-02 DIAGNOSIS — I5023 Acute on chronic systolic (congestive) heart failure: Secondary | ICD-10-CM

## 2013-04-02 MED ORDER — ISOSORBIDE MONONITRATE ER 120 MG PO TB24: 120.0000 mg | ORAL_TABLET | Freq: Every day | ORAL | Status: DC

## 2013-04-02 MED ORDER — FUROSEMIDE 40 MG PO TABS: 40.0000 mg | ORAL_TABLET | Freq: Every day | ORAL | Status: DC

## 2013-04-02 MED ORDER — HYDRALAZINE HCL 100 MG PO TABS: 100.0000 mg | ORAL_TABLET | Freq: Three times a day (TID) | ORAL | Status: DC

## 2013-04-02 MED ORDER — CARVEDILOL 12.5 MG PO TABS: 12.5000 mg | ORAL_TABLET | Freq: Two times a day (BID) | ORAL | Status: DC

## 2013-04-02 MED ORDER — ATORVASTATIN CALCIUM 40 MG PO TABS: 40.0000 mg | ORAL_TABLET | Freq: Every day | ORAL | Status: DC

## 2013-04-02 NOTE — Progress Notes (Signed)
Despina Hidden Date of Birth  January 30, 1951       [facility] [address], [address], [place], [place]  [address]   [place], [place]  [address] [place]   Fax  [phone]     Fax [phone]  Problem List: 1. Chronic systolic congestive heart failure 2. Coronary artery disease-occluded right coronary artery and severe stenosis of the left circumflex artery B. Poorly controlled diabetes mellitus 4. Pulmonary hypertension 5.  History of Present Illness:  Feb. 24, 2015:  The patient was admitted to the hospital earlier this month. I saw her in the  She had acute on chronic congestive heart failure. She had a cardiac catheterization which revealed severe two-vessel coronary artery disease-occluded right coronary artery with left to right collateral filling. She also had severe disease of her circumflex marginal system. The LAD has moderate disease. Her left ventricular systolic function is moderately to severely depressed with an EF of 30-35%.  She was seen by the advanced heart failure team.   Echo: - Left ventricle: The cavity size was normal. Systolic function was moderately to severely reduced. The estimated ejection fraction was in the range of 30% to 35%. Diffuse hypokinesis. - Aortic valve: Moderate regurgitation. - Mitral valve: Mild regurgitation. - Right atrium: The atrium was mildly dilated. - Tricuspid valve: Moderate regurgitation. - Pulmonic valve: Moderate regurgitation. - Pulmonary arteries: Systolic pressure was moderately increased. PA peak pressure: 75mm Hg   She has a GI bug - has had N/V yesterday. She has had weakness and loss ob appetite for the past 2 weeks.    Her breathing is better.  Her weight has been fairly stable.    Current Outpatient Prescriptions on File Prior to Visit  Medication Sig Dispense Refill  . aspirin EC 81 MG EC tablet Take 1 tablet (81 mg total) by mouth  daily.  30 tablet  0  . atorvastatin (LIPITOR) 40 MG tablet Take 1 tablet (40 mg total) by mouth daily at 6 PM.  30 tablet  0  . carvedilol (COREG) 12.5 MG tablet Take 1 tablet (12.5 mg total) by mouth 2 (two) times daily with a meal.  60 tablet  0  . furosemide (LASIX) 40 MG tablet Take 1 tablet (40 mg total) by mouth daily.  60 tablet  0  . glipiZIDE (GLUCOTROL XL) 2.5 MG 24 hr tablet Take 1 tablet (2.5 mg total) by mouth daily with breakfast.  30 tablet  0  . hydrALAZINE (APRESOLINE) 100 MG tablet Take 1 tablet (100 mg total) by mouth every 8 (eight) hours.  90 tablet  0  . isosorbide mononitrate (IMDUR) 120 MG 24 hr tablet Take 1 tablet (120 mg total) by mouth daily.  30 tablet  0  . pantoprazole (PROTONIX) 40 MG tablet Take 1 tablet (40 mg total) by mouth 2 (two) times daily.  14 tablet  0  . sodium bicarbonate 650 MG tablet Take 1 tablet (650 mg total) by mouth 2 (two) times daily.  30 tablet  0   No current facility-administered medications on file prior to visit.    No Known Allergies  Past Medical History  Diagnosis Date  . Diabetes mellitus     Reportedly diagnosed 2011 but no medication initiated until 04/2011 (Metformin)  . Hypertension   . Pneumonia   . Diabetic peripheral neuropathy     Since 2013  . CHF (congestive  heart failure) 08/2012  . Anemia 08/2012  . UTI (urinary tract infection) 08/22/2012    Past Surgical History  Procedure Laterality Date  . Cesarean section    . Tubal ligation    . Esophagogastroduodenoscopy N/A 03/13/2013    Procedure: ESOPHAGOGASTRODUODENOSCOPY (EGD);  Surgeon: Winfield Cunas., MD;  Location: [facility] ENDOSCOPY;  Service: Endoscopy;  Laterality: N/A;    History  Smoking status  . Never Smoker   Smokeless tobacco  . Never Used    History  Alcohol Use No    Family History  Problem Relation Age of Onset  . Diabetes Brother     deceased in 48N dt DM complications    Reviw of Systems:  Reviewed in the HPI.  All other systems are  negative.  Physical Exam: Blood pressure 130/69, pulse 83, height 4\' 11"  (1.499 m), weight 124 lb (56.246 kg). Wt Readings from Last 3 Encounters:  04/02/13 124 lb (56.246 kg)  03/19/13 112 lb 10.5 oz (51.1 kg)  03/19/13 112 lb 10.5 oz (51.1 kg)     General: she appears to be ill.  She has been vomitting for 2 days.  She likely has a viral illness  Head: Normocephalic, atraumatic, sclera non-icteric, mucus membranes are moist,   Neck: Supple. Carotids are 2 + without bruits. No JVD   Lungs: Clear   Heart: RR, I6E7 , 1-2 / 6 systolic murmur  Abdomen: Soft, non-tender, non-distended with normal bowel sounds.  Msk:  Strength and tone are normal   Extremities: No clubbing or cyanosis. No edema.  Distal pedal pulses are 2+ and equal    Neuro: CN II - XII intact.  Alert and oriented X 3.   Psych:  Normal   ECG:   Assessment / Plan:

## 2013-04-02 NOTE — Assessment & Plan Note (Addendum)
[name] [name] seems to be stable from a CHf standpoint.  She has a viral illness today and has been vomiting for the past 2 days. She's not feeling well but I think most of her symptoms today are due to her viral illness.  We will refill her medications. She's been seen by the advanced heart failure clinic and may need to arrange followup with him. I do not think that she is a candidate for advanced therapies at this time but she may benefit from the close followup at the heart failure team can provide.  I'll see her in 3 months for followup visit. She's not feeling any better we'll consider transferring her care to be advanced heart failure clinic.

## 2013-04-02 NOTE — Patient Instructions (Addendum)
Your meds were refilled/ sent to pharmacy/  Your physician recommends that you schedule a follow-up appointment in: 3 MONTHS   REDUCE [place], GRAVY, SAUCES, READY PREPARED FOODS [place]; LEAN CUISINE, LASAGNA. BACON, SAUSAGE, LUNCH MEAT, FAST FOODS, HOT DOGS, CHIPS, PIZZA, CHINESE FOOD, SOY SAUCE, STORE BOUGHT FRIED CHICKEN= [place] FRIED CHICKEN/ BO JANGLES.      Your physician recommends that you continue on your current medications as directed. Please refer to the Current Medication list given to you today.   SINCE VOMITING// EAT BANANAS/  RICE// APPLE SAUCE OR TOAST, CONTINUE TO SIP FLUIDS AND FOLLOW UP WITH YOUR PRIMARY CARE PHYSICIAN.

## 2013-04-08 ENCOUNTER — Ambulatory Visit: Payer: Medicaid Other | Attending: Internal Medicine | Admitting: Internal Medicine

## 2013-04-08 ENCOUNTER — Encounter: Payer: Self-pay | Admitting: Internal Medicine

## 2013-04-08 ENCOUNTER — Ambulatory Visit: Payer: Medicaid Other

## 2013-04-08 VITALS — BP 89/52 | HR 60 | Temp 98.1°F | Resp 14 | Ht 59.0 in | Wt 136.0 lb

## 2013-04-08 DIAGNOSIS — I255 Ischemic cardiomyopathy: Secondary | ICD-10-CM

## 2013-04-08 DIAGNOSIS — I959 Hypotension, unspecified: Secondary | ICD-10-CM | POA: Insufficient documentation

## 2013-04-08 DIAGNOSIS — I2789 Other specified pulmonary heart diseases: Secondary | ICD-10-CM | POA: Insufficient documentation

## 2013-04-08 DIAGNOSIS — N189 Chronic kidney disease, unspecified: Secondary | ICD-10-CM | POA: Diagnosis not present

## 2013-04-08 DIAGNOSIS — I2510 Atherosclerotic heart disease of native coronary artery without angina pectoris: Secondary | ICD-10-CM | POA: Insufficient documentation

## 2013-04-08 DIAGNOSIS — E8779 Other fluid overload: Secondary | ICD-10-CM

## 2013-04-08 DIAGNOSIS — N184 Chronic kidney disease, stage 4 (severe): Secondary | ICD-10-CM

## 2013-04-08 DIAGNOSIS — I129 Hypertensive chronic kidney disease with stage 1 through stage 4 chronic kidney disease, or unspecified chronic kidney disease: Secondary | ICD-10-CM | POA: Insufficient documentation

## 2013-04-08 DIAGNOSIS — I509 Heart failure, unspecified: Secondary | ICD-10-CM | POA: Diagnosis not present

## 2013-04-08 DIAGNOSIS — R42 Dizziness and giddiness: Secondary | ICD-10-CM | POA: Insufficient documentation

## 2013-04-08 DIAGNOSIS — I5022 Chronic systolic (congestive) heart failure: Secondary | ICD-10-CM | POA: Diagnosis not present

## 2013-04-08 DIAGNOSIS — E1149 Type 2 diabetes mellitus with other diabetic neurological complication: Secondary | ICD-10-CM | POA: Insufficient documentation

## 2013-04-08 DIAGNOSIS — Z09 Encounter for follow-up examination after completed treatment for conditions other than malignant neoplasm: Secondary | ICD-10-CM | POA: Diagnosis present

## 2013-04-08 DIAGNOSIS — E1142 Type 2 diabetes mellitus with diabetic polyneuropathy: Secondary | ICD-10-CM | POA: Insufficient documentation

## 2013-04-08 DIAGNOSIS — E8770 Fluid overload, unspecified: Secondary | ICD-10-CM | POA: Insufficient documentation

## 2013-04-08 DIAGNOSIS — R609 Edema, unspecified: Secondary | ICD-10-CM | POA: Diagnosis not present

## 2013-04-08 DIAGNOSIS — R404 Transient alteration of awareness: Secondary | ICD-10-CM | POA: Diagnosis not present

## 2013-04-08 DIAGNOSIS — I2589 Other forms of chronic ischemic heart disease: Secondary | ICD-10-CM | POA: Diagnosis not present

## 2013-04-08 DIAGNOSIS — E119 Type 2 diabetes mellitus without complications: Secondary | ICD-10-CM | POA: Insufficient documentation

## 2013-04-08 LAB — GLUCOSE, POCT (MANUAL RESULT ENTRY): POC Glucose: 131 mg/dl — AB (ref 70–99)

## 2013-04-08 MED ORDER — HYDRALAZINE HCL 100 MG PO TABS: 100.0000 mg | ORAL_TABLET | Freq: Three times a day (TID) | ORAL | Status: DC

## 2013-04-08 MED ORDER — ISOSORBIDE MONONITRATE ER 60 MG PO TB24: 90.0000 mg | ORAL_TABLET | Freq: Every day | ORAL | Status: DC

## 2013-04-08 MED ORDER — CARVEDILOL 12.5 MG PO TABS: 12.5000 mg | ORAL_TABLET | Freq: Two times a day (BID) | ORAL | Status: DC

## 2013-04-08 MED ORDER — ATORVASTATIN CALCIUM 40 MG PO TABS: 40.0000 mg | ORAL_TABLET | Freq: Every day | ORAL | Status: DC

## 2013-04-08 MED ORDER — ASPIRIN 81 MG PO TBEC: 81.0000 mg | DELAYED_RELEASE_TABLET | Freq: Every day | ORAL | Status: AC

## 2013-04-08 MED ORDER — GLIPIZIDE ER 2.5 MG PO TB24: 2.5000 mg | ORAL_TABLET | Freq: Every day | ORAL | Status: DC

## 2013-04-08 MED ORDER — SODIUM BICARBONATE 650 MG PO TABS: 650.0000 mg | ORAL_TABLET | Freq: Two times a day (BID) | ORAL | Status: DC

## 2013-04-08 MED ORDER — PANTOPRAZOLE SODIUM 40 MG PO TBEC: 40.0000 mg | DELAYED_RELEASE_TABLET | Freq: Every day | ORAL | Status: DC

## 2013-04-08 MED ORDER — FUROSEMIDE 40 MG PO TABS: 40.0000 mg | ORAL_TABLET | Freq: Every day | ORAL | Status: DC

## 2013-04-08 NOTE — Progress Notes (Signed)
HFU Pt is here following up on her systolic heart failure.

## 2013-04-08 NOTE — Progress Notes (Signed)
Patient ID: [name] [name], female   DOB: 05-05-1950, 63 y.o.   MRN: [id number]   [name] [name], is a 63 y.o. female  GLO:[id number]  JOA:[id number]  DOB - [birth year]  CC:  Chief Complaint  Patient presents with  . Hospitalization Follow-up       HPI: [name] [name] is a 63 y.o. female here today to establish medical care and follow up on a hospital visit. She is here with her son who interprets for her and provides care.Today she is complaining of dizziness, drowsiness, bilateral lower extremeity edema, and a low blood pressure. She has a past medical history of Ischemic cardiomyopathy, systolic heart failure, with an ejection fraction of 30-35%, CAD, Pulmonary Hypertension, CKD, and Diabetes Mellitus. Her heart failure, CAD, cardiomyopathy and pulmonary hypertension are being followed by her cardiologist Dr. Cathie Olden. She has a follow up in one month with cardiology.  She monitors her weight daily and was educated on weighing at the same time wearing the same clothing. She checks her blood glucose twice daily at home and takes glipizide for diabetes control. She is having gastric fullness with meals and is currently on protonix. Presently she is not being followed by a nephrologist for her chronic kidney disease. She follows a low salt diet. She does not smoke. Patient has No headache, No chest pain, No abdominal pain - No Nausea, No new weakness tingling or numbness, No Cough - SOB.  No Known Allergies Past Medical History  Diagnosis Date  . Diabetes mellitus     Reportedly diagnosed 2011 but no medication initiated until 04/2011 (Metformin)  . Hypertension   . Pneumonia   . Diabetic peripheral neuropathy     Since 2013  . CHF (congestive heart failure) 08/2012  . Anemia 08/2012  . UTI (urinary tract infection) 08/22/2012   No current outpatient prescriptions on file prior to visit.   No current facility-administered medications on file prior to visit.   Family History  Problem Relation Age of Onset    . Diabetes Brother     deceased in 60F dt DM complications   History   Social History  . Marital Status: Married    Spouse Name: N/A    Number of Children: N/A  . Years of Education: N/A   Occupational History  . Not on file.   Social History Main Topics  . Smoking status: Never Smoker   . Smokeless tobacco: Never Used  . Alcohol Use: No  . Drug Use: No  . Sexual Activity: Not on file   Other Topics Concern  . Not on file   Social History Narrative   Patient lives with husband and son. Currently without insurance and therefore tries not seek medical care dt financial concern.     Review of Systems: Constitutional: Negative for fever, chills, diaphoresis, activity change, appetite change and fatigue. HENT: Negative for ear pain, nosebleeds, congestion, facial swelling, rhinorrhea, neck pain, neck stiffness and ear discharge.  Eyes: Negative for pain, discharge, redness, itching and visual disturbance. Respiratory: Negative for cough, choking, chest tightness, shortness of breath, wheezing and stridor.  Cardiovascular: Negative for chest pain, palpitations. 2+ bilateral lower extremity edema. Gastrointestinal: Mild abdominal distention. Genitourinary: Negative for dysuria, urgency, frequency, hematuria, flank pain, decreased urine volume, difficulty urinating and dyspareunia.  Musculoskeletal: Negative for back pain, joint swelling, arthralgia and gait problem. Neurological: Negative for tremors, seizures, syncope, facial asymmetry, speech difficulty, weakness, light-headedness, numbness and headaches. Positive for dizziness and drowsiness. Hematological: Negative for adenopathy. Does not bruise/bleed  easily. Psychiatric/Behavioral: Negative for hallucinations, behavioral problems, confusion, dysphoric mood, decreased concentration and agitation.    Objective:   Filed Vitals:   04/08/13 0937  BP: 89/52  Pulse: 60  Temp: 98.1 F (36.7 C)  Resp: 14    Physical  Exam: Constitutional: Patient appears well-developed and well-nourished. No distress. HENT: Normocephalic, atraumatic, External right and left ear normal. Oropharynx is clear and moist.  Eyes: Conjunctivae and EOM are normal. PERRLA, no scleral icterus. Neck: Normal ROM. Neck supple. No JVD. No tracheal deviation. No thyromegaly. CVS: RRR, S1/S2 +, no murmurs, no gallops, no carotid bruit.  Pulmonary: Effort and breath sounds normal, no stridor, rhonchi, wheezes, rales.  Abdominal: Soft. BS +, mild distension, tenderness, rebound or guarding.  Musculoskeletal: Normal range of motion. 2 + edema noted in bilateral lower extremeties and no tenderness, no red  Lymphadenopathy: No lymphadenopathy noted, cervical, inguinal or axillary Neuro: Alert. Normal reflexes, muscle tone coordination. No cranial nerve deficit. Skin: Skin is warm and dry. No rash noted. Not diaphoretic. No erythema. No pallor. Psychiatric: Normal mood and affect. Behavior, judgment, thought content normal.  Lab Results  Component Value Date   WBC 5.3 03/18/2013   HGB 11.4* 03/18/2013   HCT 34.0* 03/18/2013   MCV 89.2 03/18/2013   PLT 127* 03/18/2013   Lab Results  Component Value Date   CREATININE 1.69* 03/19/2013   BUN 27* 03/19/2013   NA 129* 03/19/2013   K 4.2 03/19/2013   CL 88* 03/19/2013   CO2 31 03/19/2013    Lab Results  Component Value Date   HGBA1C 6.8* 03/06/2013   Lipid Panel  No results found for this basename: chol, trig, hdl, cholhdl, vldl, ldlcalc       Assessment and plan:   1. Diabetes  - Glucose (CBG). Last hemoglobin A1c was 6.8% - glipiZIDE (GLUCOTROL XL) 2.5 MG 24 hr tablet; Take 1 tablet (2.5 mg total) by mouth daily with breakfast.  Dispense: 90 tablet; Refill: 3  2. Chronic systolic congestive heart failure  - aspirin 81 MG EC tablet; Take 1 tablet (81 mg total) by mouth daily.  Dispense: 90 tablet; Refill: 3 - atorvastatin (LIPITOR) 40 MG tablet; Take 1 tablet (40 mg total) by mouth daily at  6 PM.  Dispense: 90 tablet; Refill: 3 - carvedilol (COREG) 12.5 MG tablet; Take 1 tablet (12.5 mg total) by mouth 2 (two) times daily with a meal.  Dispense: 180 tablet; Refill: 3 - furosemide (LASIX) 40 MG tablet; Take 1 tablet (40 mg total) by mouth daily.  Dispense: 90 tablet; Refill: 3 - hydrALAZINE (APRESOLINE) 100 MG tablet; Take 1 tablet (100 mg total) by mouth every 8 (eight) hours. Like 7 am 2 pm and 10 pm  Dispense: 270 tablet; Refill: 3 - isosorbide mononitrate (IMDUR) 60 MG 24 hr tablet; Take 1.5 tablets (90 mg total) by mouth daily.  Dispense: 180 tablet; Refill: 3 - pantoprazole (PROTONIX) 40 MG tablet; Take 1 tablet (40 mg total) by mouth daily.  Dispense: 90 tablet; Refill: 3 - sodium bicarbonate 650 MG tablet; Take 1 tablet (650 mg total) by mouth 2 (two) times daily.  Dispense: 120 tablet; Refill: 3  3. Fluid overload Continue diuretics as prescribed above  4. 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 Hypotension, unspecified I will reduce the dose of isosorbide mononitrate from 120 mg daily to 90 mg daily by mouth See patient back in 2 weeks for blood pressure check  I have given specific instruction on symptoms of hypo-tension, included dizziness and lightheadedness for the patient she'll return to the clinic immediately I instructed the son to measure the blood pressure every day and keep a log, bring to clinic in 2 weeks  6. Cardiomyopathy, ischemic Patient was extensively counseled on nutrition and exercise as tolerated We discussed blood pressure goal Patient encouraged to follow-up with cardiologist as scheduled   Return in about 2 weeks (around 04/22/2013), or if symptoms worsen or fail to improve, for BP Check, Routine Follow Up.  The patient was given clear instructions to go to ER or return to medical center if symptoms don't improve, worsen or new problems develop. The patient verbalized  understanding. The patient was told to call to get lab results if they haven't heard anything in the next week.     Angelica Chessman, MD, [place], [place], [place], [place]   04/08/2013, 10:59 AM

## 2013-04-08 NOTE — Patient Instructions (Signed)
Heart Failure °Heart failure is a condition in which the heart has trouble pumping blood. This means your heart does not pump blood efficiently for your body to work well. In some cases of heart failure, fluid may back up into your lungs or you may have swelling (edema) in your lower legs. Heart failure is usually a long-term (chronic) condition. It is important for you to take good care of yourself and follow your caregiver's treatment plan. °CAUSES  °Some health conditions can cause heart failure. Those health conditions include: °· High blood pressure (hypertension) causes the heart muscle to work harder than normal. When pressure in the blood vessels is high, the heart needs to pump (contract) with more force in order to circulate blood throughout the body. High blood pressure eventually causes the heart to become stiff and weak. °· Coronary artery disease (CAD) is the buildup of cholesterol and fat (plaque) in the arteries of the heart. The blockage in the arteries deprives the heart muscle of oxygen and blood. This can cause chest pain and may lead to a heart attack. High blood pressure can also contribute to CAD. °· Heart attack (myocardial infarction) occurs when 1 or more arteries in the heart become blocked. The loss of oxygen damages the muscle tissue of the heart. When this happens, part of the heart muscle dies. The injured tissue does not contract as well and weakens the heart's ability to pump blood. °· Abnormal heart valves can cause heart failure when the heart valves do not open and close properly. This makes the heart muscle pump harder to keep the blood flowing. °· Heart muscle disease (cardiomyopathy or myocarditis) is damage to the heart muscle from a variety of causes. These can include drug or alcohol abuse, infections, or unknown reasons. These can increase the risk of heart failure. °· Lung disease makes the heart work harder because the lungs do not work properly. This can cause a strain  on the heart, leading it to fail. °· Diabetes increases the risk of heart failure. High blood sugar contributes to high fat (lipid) levels in the blood. Diabetes can also cause slow damage to tiny blood vessels that carry important nutrients to the heart muscle. When the heart does not get enough oxygen and food, it can cause the heart to become weak and stiff. This leads to a heart that does not contract efficiently. °· Other conditions can contribute to heart failure. These include abnormal heart rhythms, thyroid problems, and low blood counts (anemia). °Certain unhealthy behaviors can increase the risk of heart failure. Those unhealthy behaviors include: °· Being overweight. °· Smoking or chewing tobacco. °· Eating foods high in fat and cholesterol. °· Abusing illicit drugs or alcohol. °· Lacking physical activity. °SYMPTOMS  °Heart failure symptoms may vary and can be hard to detect. Symptoms may include: °· Shortness of breath with activity, such as climbing stairs. °· Persistent cough. °· Swelling of the feet, ankles, legs, or abdomen. °· Unexplained weight gain. °· Difficulty breathing when lying flat (orthopnea). °· Waking from sleep because of the need to sit up and get more air. °· Rapid heartbeat. °· Fatigue and loss of energy. °· Feeling lightheaded, dizzy, or close to fainting. °· Loss of appetite. °· Nausea. °· Increased urination during the night (nocturia). °DIAGNOSIS  °A diagnosis of heart failure is based on your history, symptoms, physical examination, and diagnostic tests. °Diagnostic tests for heart failure may include: °· Echocardiography. °· Electrocardiography. °· Chest X-ray. °· Blood tests. °· Exercise   stress test. °· Cardiac angiography. °· Radionuclide scans. °TREATMENT  °Treatment is aimed at managing the symptoms of heart failure. Medicines, behavioral changes, or surgical intervention may be necessary to treat heart failure. °· Medicines to help treat heart failure may  include: °· Angiotensin-converting enzyme (ACE) inhibitors. This type of medicine blocks the effects of a blood protein called angiotensin-converting enzyme. ACE inhibitors relax (dilate) the blood vessels and help lower blood pressure. °· Angiotensin receptor blockers. This type of medicine blocks the actions of a blood protein called angiotensin. Angiotensin receptor blockers dilate the blood vessels and help lower blood pressure. °· Water pills (diuretics). Diuretics cause the kidneys to remove salt and water from the blood. The extra fluid is removed through urination. This loss of extra fluid lowers the volume of blood the heart pumps. °· Beta blockers. These prevent the heart from beating too fast and improve heart muscle strength. °· Digitalis. This increases the force of the heartbeat. °· Healthy behavior changes include: °· Obtaining and maintaining a healthy weight. °· Stopping smoking or chewing tobacco. °· Eating heart healthy foods. °· Limiting or avoiding alcohol. °· Stopping illicit drug use. °· Physical activity as directed by your caregiver. °· Surgical treatment for heart failure may include: °· A procedure to open blocked arteries, repair damaged heart valves, or remove damaged heart muscle tissue. °· A pacemaker to improve heart muscle function and control certain abnormal heart rhythms. °· An internal cardioverter defibrillator to treat certain serious abnormal heart rhythms. °· A left ventricular assist device to assist the pumping ability of the heart. °HOME CARE INSTRUCTIONS  °· Take your medicine as directed by your caregiver. Medicines are important in reducing the workload of your heart, slowing the progression of heart failure, and improving your symptoms. °· Do not stop taking your medicine unless directed by your caregiver. °· Do not skip any dose of medicine. °· Refill your prescriptions before you run out of medicine. Your medicines are needed every day. °· Take over-the-counter  medicine only as directed by your caregiver or pharmacist. °· Engage in moderate physical activity if directed by your caregiver. Moderate physical activity can benefit some people. The elderly and people with severe heart failure should consult with a caregiver for physical activity recommendations. °· Eat heart healthy foods. Food choices should be free of trans fat and low in saturated fat, cholesterol, and salt (sodium). Healthy choices include fresh or frozen fruits and vegetables, fish, lean meats, legumes, fat-free or low-fat dairy products, and whole grain or high fiber foods. Talk to a dietitian to learn more about heart healthy foods. °· Limit sodium if directed by your caregiver. Sodium restriction may reduce symptoms of heart failure in some people. Talk to a dietitian to learn more about heart healthy seasonings. °· Use healthy cooking methods. Healthy cooking methods include roasting, grilling, broiling, baking, poaching, steaming, or stir-frying. Talk to a dietitian to learn more about healthy cooking methods. °· Limit fluids if directed by your caregiver. Fluid restriction may reduce symptoms of heart failure in some people. °· Weigh yourself every day. Daily weights are important in the early recognition of excess fluid. You should weigh yourself every morning after you urinate and before you eat breakfast. Wear the same amount of clothing each time you weigh yourself. Record your daily weight. Provide your caregiver with your weight record. °· Monitor and record your blood pressure if directed by your caregiver. °· Check your pulse if directed by your caregiver. °· Lose weight if directed   by your caregiver. Weight loss may reduce symptoms of heart failure in some people. °· Stop smoking or chewing tobacco. Nicotine makes your heart work harder by causing your blood vessels to constrict. Do not use nicotine gum or patches before talking to your caregiver. °· Schedule and attend follow-up visits as  directed by your caregiver. It is important to keep all your appointments. °· Limit alcohol intake to no more than 1 drink per day for nonpregnant women and 2 drinks per day for men. Drinking more than that is harmful to your heart. Tell your caregiver if you drink alcohol several times a week. Talk with your caregiver about whether alcohol is safe for you. If your heart has already been damaged by alcohol or you have severe heart failure, drinking alcohol should be stopped completely. °· Stop illicit drug use. °· Stay up-to-date with immunizations. It is especially important to prevent respiratory infections through current pneumococcal and influenza immunizations. °· Manage other health conditions such as hypertension, diabetes, thyroid disease, or abnormal heart rhythms as directed by your caregiver. °· Learn to manage stress. °· Plan rest periods when fatigued. °· Learn strategies to manage high temperatures. If the weather is extremely hot: °· Avoid vigorous physical activity. °· Use air conditioning or fans or seek a cooler location. °· Avoid caffeine and alcohol. °· Wear loose-fitting, lightweight, and light-colored clothing. °· Learn strategies to manage cold temperatures. If the weather is extremely cold: °· Avoid vigorous physical activity. °· Layer clothes. °· Wear mittens or gloves, a hat, and a scarf when going outside. °· Avoid alcohol. °· Obtain ongoing education and support as needed. °· Participate or seek rehabilitation as needed to maintain or improve independence and quality of life. °SEEK MEDICAL CARE IF:  °· Your weight increases by 03 lb/1.4 kg in 1 day or 05 lb/2.3 kg in a week. °· You have increasing shortness of breath that is unusual for you. °· You are unable to participate in your usual physical activities. °· You tire easily. °· You cough more than normal, especially with physical activity. °· You have any or more swelling in areas such as your hands, feet, ankles, or abdomen. °· You  are unable to sleep because it is hard to breathe. °· You feel like your heart is beating fast (palpitations). °· You become dizzy or lightheaded upon standing up. °SEEK IMMEDIATE MEDICAL CARE IF:  °· You have difficulty breathing. °· There is a change in mental status such as decreased alertness or difficulty with concentration. °· You have a pain or discomfort in your chest. °· You have an episode of fainting (syncope). °MAKE SURE YOU:  °· Understand these instructions. °· Will watch your condition. °· Will get help right away if you are not doing well or get worse. °Document Released: 01/24/2005 Document Revised: 05/21/2012 Document Reviewed: 02/16/2012 °ExitCare® Patient Information ©2014 [employer]. ° °

## 2013-06-25 ENCOUNTER — Inpatient Hospital Stay (HOSPITAL_COMMUNITY)
Admission: EM | Admit: 2013-06-25 | Discharge: 2013-07-10 | DRG: 292 | Disposition: A | Discharge status: Home Health | Payer: Medicaid Other | Attending: Cardiology | Admitting: Cardiology

## 2013-06-25 ENCOUNTER — Emergency Department (HOSPITAL_COMMUNITY): Payer: Medicaid Other

## 2013-06-25 ENCOUNTER — Encounter (HOSPITAL_COMMUNITY): Payer: Self-pay | Admitting: Emergency Medicine

## 2013-06-25 DIAGNOSIS — I5023 Acute on chronic systolic (congestive) heart failure: Principal | ICD-10-CM | POA: Diagnosis present

## 2013-06-25 DIAGNOSIS — Z7982 Long term (current) use of aspirin: Secondary | ICD-10-CM | POA: Diagnosis not present

## 2013-06-25 DIAGNOSIS — E1149 Type 2 diabetes mellitus with other diabetic neurological complication: Secondary | ICD-10-CM | POA: Diagnosis present

## 2013-06-25 DIAGNOSIS — I509 Heart failure, unspecified: Secondary | ICD-10-CM | POA: Diagnosis present

## 2013-06-25 DIAGNOSIS — N179 Acute kidney failure, unspecified: Secondary | ICD-10-CM | POA: Diagnosis present

## 2013-06-25 DIAGNOSIS — E46 Unspecified protein-calorie malnutrition: Secondary | ICD-10-CM | POA: Diagnosis present

## 2013-06-25 DIAGNOSIS — I2510 Atherosclerotic heart disease of native coronary artery without angina pectoris: Secondary | ICD-10-CM | POA: Diagnosis present

## 2013-06-25 DIAGNOSIS — R609 Edema, unspecified: Secondary | ICD-10-CM | POA: Diagnosis not present

## 2013-06-25 DIAGNOSIS — Z79899 Other long term (current) drug therapy: Secondary | ICD-10-CM

## 2013-06-25 DIAGNOSIS — I5022 Chronic systolic (congestive) heart failure: Secondary | ICD-10-CM | POA: Diagnosis present

## 2013-06-25 DIAGNOSIS — D6489 Other specified anemias: Secondary | ICD-10-CM | POA: Diagnosis present

## 2013-06-25 DIAGNOSIS — D649 Anemia, unspecified: Secondary | ICD-10-CM | POA: Diagnosis present

## 2013-06-25 DIAGNOSIS — I498 Other specified cardiac arrhythmias: Secondary | ICD-10-CM | POA: Diagnosis present

## 2013-06-25 DIAGNOSIS — I130 Hypertensive heart and chronic kidney disease with heart failure and stage 1 through stage 4 chronic kidney disease, or unspecified chronic kidney disease: Secondary | ICD-10-CM | POA: Diagnosis present

## 2013-06-25 DIAGNOSIS — I272 Pulmonary hypertension, unspecified: Secondary | ICD-10-CM | POA: Diagnosis present

## 2013-06-25 DIAGNOSIS — I255 Ischemic cardiomyopathy: Secondary | ICD-10-CM | POA: Diagnosis present

## 2013-06-25 DIAGNOSIS — K920 Hematemesis: Secondary | ICD-10-CM

## 2013-06-25 DIAGNOSIS — E1142 Type 2 diabetes mellitus with diabetic polyneuropathy: Secondary | ICD-10-CM | POA: Diagnosis present

## 2013-06-25 DIAGNOSIS — I2589 Other forms of chronic ischemic heart disease: Secondary | ICD-10-CM | POA: Diagnosis present

## 2013-06-25 DIAGNOSIS — N189 Chronic kidney disease, unspecified: Secondary | ICD-10-CM | POA: Diagnosis present

## 2013-06-25 DIAGNOSIS — Z833 Family history of diabetes mellitus: Secondary | ICD-10-CM

## 2013-06-25 DIAGNOSIS — R112 Nausea with vomiting, unspecified: Secondary | ICD-10-CM

## 2013-06-25 DIAGNOSIS — I10 Essential (primary) hypertension: Secondary | ICD-10-CM

## 2013-06-25 DIAGNOSIS — E1169 Type 2 diabetes mellitus with other specified complication: Secondary | ICD-10-CM | POA: Diagnosis present

## 2013-06-25 DIAGNOSIS — E119 Type 2 diabetes mellitus without complications: Secondary | ICD-10-CM | POA: Diagnosis present

## 2013-06-25 DIAGNOSIS — I2789 Other specified pulmonary heart diseases: Secondary | ICD-10-CM | POA: Diagnosis present

## 2013-06-25 DIAGNOSIS — N184 Chronic kidney disease, stage 4 (severe): Secondary | ICD-10-CM | POA: Diagnosis present

## 2013-06-25 DIAGNOSIS — D696 Thrombocytopenia, unspecified: Secondary | ICD-10-CM | POA: Diagnosis present

## 2013-06-25 HISTORY — DX: Chronic kidney disease, unspecified: N18.9

## 2013-06-25 HISTORY — DX: Pulmonary hypertension, unspecified: I27.20

## 2013-06-25 HISTORY — DX: Ischemic cardiomyopathy: I25.5

## 2013-06-25 HISTORY — DX: Ventricular tachycardia: I47.2

## 2013-06-25 HISTORY — DX: Atherosclerotic heart disease of native coronary artery without angina pectoris: I25.10

## 2013-06-25 HISTORY — DX: Thrombocytopenia, unspecified: D69.6

## 2013-06-25 LAB — CBC
HCT: 30.1 % — ABNORMAL LOW (ref 36.0–46.0)
Hemoglobin: 10 g/dL — ABNORMAL LOW (ref 12.0–15.0)
MCH: 30.1 pg (ref 26.0–34.0)
MCHC: 33.2 g/dL (ref 30.0–36.0)
MCV: 90.7 fL (ref 78.0–100.0)
PLATELETS: 69 10*3/uL — AB (ref 150–400)
RBC: 3.32 MIL/uL — ABNORMAL LOW (ref 3.87–5.11)
RDW: 17.4 % — AB (ref 11.5–15.5)
WBC: 5 10*3/uL (ref 4.0–10.5)

## 2013-06-25 LAB — BASIC METABOLIC PANEL
BUN: 45 mg/dL — ABNORMAL HIGH (ref 6–23)
CO2: 26 mEq/L (ref 19–32)
CREATININE: 1.43 mg/dL — AB (ref 0.50–1.10)
Calcium: 8.5 mg/dL (ref 8.4–10.5)
Chloride: 99 mEq/L (ref 96–112)
GFR, EST AFRICAN AMERICAN: 44 mL/min — AB (ref >90)
GFR, EST NON AFRICAN AMERICAN: 38 mL/min — AB (ref >90)
Glucose, Bld: 100 mg/dL — ABNORMAL HIGH (ref 70–99)
Potassium: 4.6 mEq/L (ref 3.7–5.3)
Sodium: 137 mEq/L (ref 137–147)

## 2013-06-25 LAB — HEMOGLOBIN A1C
HEMOGLOBIN A1C: 5.9 % — AB (ref <5.7)
Mean Plasma Glucose: 123 mg/dL — ABNORMAL HIGH (ref <117)

## 2013-06-25 LAB — GLUCOSE, CAPILLARY
GLUCOSE-CAPILLARY: 218 mg/dL — AB (ref 70–99)
Glucose-Capillary: 24 mg/dL — CL (ref 70–99)
Glucose-Capillary: 126 mg/dL — ABNORMAL HIGH (ref 70–99)

## 2013-06-25 LAB — TSH: TSH: 5.01 u[IU]/mL — ABNORMAL HIGH (ref 0.350–4.500)

## 2013-06-25 LAB — PRO B NATRIURETIC PEPTIDE: Pro B Natriuretic peptide (BNP): 27880 pg/mL — ABNORMAL HIGH (ref 0–125)

## 2013-06-25 LAB — I-STAT TROPONIN, ED: TROPONIN I, POC: 0.03 ng/mL (ref 0.00–0.08)

## 2013-06-25 MED ORDER — HYDRALAZINE HCL 100 MG PO TABS: 100.0000 mg | ORAL_TABLET | Freq: Two times a day (BID) | ORAL | Status: DC

## 2013-06-25 MED ORDER — FUROSEMIDE 10 MG/ML IJ SOLN
40.0000 mg | Freq: Two times a day (BID) | INTRAMUSCULAR | Status: DC
  Administered 2013-06-25 – 2013-06-26 (×3): 40 mg via INTRAVENOUS
  Filled 2013-06-25 (×5): qty 4

## 2013-06-25 MED ORDER — SODIUM CHLORIDE 0.9 % IJ SOLN
3.0000 mL | Freq: Two times a day (BID) | INTRAMUSCULAR | Status: DC
  Administered 2013-06-25 – 2013-07-04 (×12): 3 mL via INTRAVENOUS
  Administered 2013-07-05: 11:00:00 via INTRAVENOUS
  Administered 2013-07-07 – 2013-07-09 (×4): 3 mL via INTRAVENOUS

## 2013-06-25 MED ORDER — INSULIN ASPART 100 UNIT/ML ~~LOC~~ SOLN: 0.0000 [IU] | Freq: Every day | SUBCUTANEOUS | Status: DC

## 2013-06-25 MED ORDER — FUROSEMIDE 10 MG/ML IJ SOLN
40.0000 mg | Freq: Once | INTRAMUSCULAR | Status: AC
  Administered 2013-06-25: 40 mg via INTRAVENOUS
  Filled 2013-06-25: qty 4

## 2013-06-25 MED ORDER — SODIUM CHLORIDE 0.9 % IV SOLN
250.0000 mL | INTRAVENOUS | Status: DC | PRN
  Administered 2013-07-03: 250 mL via INTRAVENOUS

## 2013-06-25 MED ORDER — NITROGLYCERIN 0.4 MG SL SUBL: 0.4000 mg | SUBLINGUAL_TABLET | SUBLINGUAL | Status: DC | PRN

## 2013-06-25 MED ORDER — SODIUM CHLORIDE 0.9 % IJ SOLN: 3.0000 mL | INTRAMUSCULAR | Status: DC | PRN

## 2013-06-25 MED ORDER — ONDANSETRON HCL 4 MG/2ML IJ SOLN
4.0000 mg | Freq: Four times a day (QID) | INTRAMUSCULAR | Status: DC | PRN
  Administered 2013-06-26: 4 mg via INTRAVENOUS
  Filled 2013-06-25: qty 2

## 2013-06-25 MED ORDER — ACETAMINOPHEN 325 MG PO TABS: 650.0000 mg | ORAL_TABLET | ORAL | Status: DC | PRN

## 2013-06-25 MED ORDER — ISOSORBIDE MONONITRATE ER 60 MG PO TB24
60.0000 mg | ORAL_TABLET | Freq: Two times a day (BID) | ORAL | Status: DC
  Administered 2013-06-25 – 2013-07-03 (×17): 60 mg via ORAL
  Filled 2013-06-25 (×19): qty 1

## 2013-06-25 MED ORDER — LOPERAMIDE HCL 2 MG PO CAPS
2.0000 mg | ORAL_CAPSULE | ORAL | Status: DC | PRN
Start: 2013-06-25 — End: 2013-07-10

## 2013-06-25 MED ORDER — DEXTROSE 50 % IV SOLN
1.0000 | Freq: Once | INTRAVENOUS | Status: AC
  Administered 2013-06-25: 50 mL via INTRAVENOUS

## 2013-06-25 MED ORDER — INSULIN ASPART 100 UNIT/ML ~~LOC~~ SOLN: 0.0000 [IU] | Freq: Three times a day (TID) | SUBCUTANEOUS | Status: DC

## 2013-06-25 MED ORDER — ZOLPIDEM TARTRATE 5 MG PO TABS: 5.0000 mg | ORAL_TABLET | Freq: Every evening | ORAL | Status: DC | PRN

## 2013-06-25 MED ORDER — ENOXAPARIN SODIUM 40 MG/0.4ML ~~LOC~~ SOLN
40.0000 mg | SUBCUTANEOUS | Status: DC
  Administered 2013-06-25: 40 mg via SUBCUTANEOUS
  Filled 2013-06-25 (×2): qty 0.4

## 2013-06-25 MED ORDER — CARVEDILOL 12.5 MG PO TABS
12.5000 mg | ORAL_TABLET | Freq: Two times a day (BID) | ORAL | Status: DC
  Administered 2013-06-25 – 2013-06-27 (×4): 12.5 mg via ORAL
  Filled 2013-06-25 (×6): qty 1

## 2013-06-25 MED ORDER — GLIPIZIDE ER 2.5 MG PO TB24
2.5000 mg | ORAL_TABLET | Freq: Every day | ORAL | Status: DC
  Filled 2013-06-25 (×2): qty 1

## 2013-06-25 MED ORDER — HYDRALAZINE HCL 50 MG PO TABS
100.0000 mg | ORAL_TABLET | Freq: Two times a day (BID) | ORAL | Status: DC
  Administered 2013-06-25 – 2013-07-01 (×13): 100 mg via ORAL
  Administered 2013-07-02: 50 mg via ORAL
  Administered 2013-07-02 – 2013-07-03 (×3): 100 mg via ORAL
  Filled 2013-06-25 (×19): qty 2

## 2013-06-25 MED ORDER — SODIUM BICARBONATE 650 MG PO TABS
650.0000 mg | ORAL_TABLET | Freq: Two times a day (BID) | ORAL | Status: DC
  Administered 2013-06-25 – 2013-06-29 (×9): 650 mg via ORAL
  Filled 2013-06-25 (×11): qty 1

## 2013-06-25 MED ORDER — ASPIRIN 81 MG PO TBEC: 81.0000 mg | DELAYED_RELEASE_TABLET | Freq: Every day | ORAL | Status: DC

## 2013-06-25 MED ORDER — ASPIRIN EC 81 MG PO TBEC
81.0000 mg | DELAYED_RELEASE_TABLET | Freq: Every day | ORAL | Status: DC
  Administered 2013-06-26 – 2013-07-10 (×15): 81 mg via ORAL
  Filled 2013-06-25 (×15): qty 1

## 2013-06-25 MED ORDER — ALPRAZOLAM 0.25 MG PO TABS: 0.2500 mg | ORAL_TABLET | Freq: Two times a day (BID) | ORAL | Status: DC | PRN

## 2013-06-25 NOTE — ED Notes (Signed)
Pt was not responding to verbal; checked CBG=24; gave 1 amp D50 once pt got to unit

## 2013-06-25 NOTE — Progress Notes (Signed)
Called by RN due to hypoglycemia. CBG had initially been 24, supplemented and improved to > 200. Pt asymptomatic now.  Will use SSI to manage sugars, put parameters on Glucotrol and make sure not to give it if she is NPO. Continue to follow.   Lonn Georgia, PA-C 06/25/2013 6:05 PM Beeper [phone]

## 2013-06-25 NOTE — ED Notes (Signed)
Patient transported to X-ray 

## 2013-06-25 NOTE — Progress Notes (Signed)
Pt's BS 24mg /dl on arrival to floor at 1734 when checked by ED nurse, noted that pt nor responding well to her name & unable to communicate with yes/no questions.  One amp D50 given via iv.  Recheck [id number] at 1747.  Pt more awake and responding more.  R. Barrett, PA notified who was on the floor at this time.  No new orders.  Will continue to monitor.  Karie Kirks, Therapist, sports.

## 2013-06-25 NOTE — ED Notes (Signed)
Pt unable to urinate.

## 2013-06-25 NOTE — ED Notes (Signed)
Her son states she has gained 20 lbs of fluid since her last hospital admission. Son states she has been fatigued, especially with activity and shes having trouble sleeping at night due to the fluid. She denies pain. She is breathing easily now

## 2013-06-25 NOTE — ED Notes (Addendum)
Provider at bedside; pt still unable to urinate

## 2013-06-25 NOTE — ED Provider Notes (Signed)
CSN: [id number]     Arrival date & time 06/25/13  1030 History   First MD Initiated Contact with Patient 06/25/13 1116     Chief Complaint  Patient presents with  . Edema      HPI Pt was seen at 1130. Per pt and her family, c/o gradual onset and worsening of persistent weight gain for the past 3 months, worse over the past 2 months. Family states pt has gained approx "20 lbs" since her last admission in February for dx CHF. Has been associated with increasing peripheral edema and SOB. SOB worsens with laying flat and ambulating. Pt has been walking progressively shorter distances than her usual due to her increasing SOB. Endorses compliance with her medications. Pt has not called her PMD or Cards MD for same. Denies palpitations/CP, no cough, no abd pain, no N/V/D, no back pain, no fevers.    Past Medical History  Diagnosis Date  . Hypertension   . Pneumonia   . CHF (congestive heart failure) 08/2012  . Anemia 08/2012  . UTI (urinary tract infection) 08/22/2012  . Ischemic cardiomyopathy     EF 30-35%  . Coronary artery disease   . Pulmonary hypertension   . NSVT (nonsustained ventricular tachycardia) 02/2013  . Diabetes mellitus     Reportedly diagnosed 2011 but no medication initiated until 04/2011 (Metformin)  . Diabetic peripheral neuropathy     Since 2013   Past Surgical History  Procedure Laterality Date  . Cesarean section    . Tubal ligation    . Esophagogastroduodenoscopy N/A 03/13/2013    Procedure: ESOPHAGOGASTRODUODENOSCOPY (EGD);  Surgeon: Winfield Cunas., MD;  Location: [facility] ENDOSCOPY;  Service: Endoscopy;  Laterality: N/A;  . Cardiac catheterization  02/2013    severe 2 vessel CAD   Family History  Problem Relation Age of Onset  . Diabetes Brother     deceased in 32I dt DM complications   History  Substance Use Topics  . Smoking status: Never Smoker   . Smokeless tobacco: Never Used  . Alcohol Use: No    Review of Systems ROS: Statement: All systems  negative except as marked or noted in the HPI; Constitutional: Negative for fever and chills. ; ; Eyes: Negative for eye pain, redness and discharge. ; ; ENMT: Negative for ear pain, hoarseness, nasal congestion, sinus pressure and sore throat. ; ; Cardiovascular: Negative for chest pain, palpitations, diaphoresis, +dyspnea and peripheral edema. ; ; Respiratory: Negative for cough, wheezing and stridor. ; ; Gastrointestinal: Negative for nausea, vomiting, diarrhea, abdominal pain, blood in stool, hematemesis, jaundice and rectal bleeding. . ; ; Genitourinary: Negative for dysuria, flank pain and hematuria. ; ; Musculoskeletal: Negative for back pain and neck pain. Negative for swelling and trauma.; ; Skin: Negative for pruritus, rash, abrasions, blisters, bruising and skin lesion.; ; Neuro: Negative for headache, lightheadedness and neck stiffness. Negative for weakness, altered level of consciousness , altered mental status, extremity weakness, paresthesias, involuntary movement, seizure and syncope.        Allergies  Review of patient's allergies indicates no known allergies.  Home Medications   Prior to Admission medications   Medication Sig Start Date End Date Taking? Authorizing Provider  aspirin 81 MG EC tablet Take 1 tablet (81 mg total) by mouth daily. 04/08/13   Angelica Chessman, MD  atorvastatin (LIPITOR) 40 MG tablet Take 1 tablet (40 mg total) by mouth daily at 6 PM. 04/08/13   Angelica Chessman, MD  carvedilol (COREG) 12.5 MG tablet Take  1 tablet (12.5 mg total) by mouth 2 (two) times daily with a meal. 04/08/13   Angelica Chessman, MD  furosemide (LASIX) 40 MG tablet Take 1 tablet (40 mg total) by mouth daily. 04/08/13   Angelica Chessman, MD  glipiZIDE (GLUCOTROL XL) 2.5 MG 24 hr tablet Take 1 tablet (2.5 mg total) by mouth daily with breakfast. 04/08/13   Angelica Chessman, MD  hydrALAZINE (APRESOLINE) 100 MG tablet Take 1 tablet (100 mg total) by mouth every 8 (eight) hours. Like 7 am 2 pm  and 10 pm 04/08/13   Angelica Chessman, MD  isosorbide mononitrate (IMDUR) 60 MG 24 hr tablet Take 1.5 tablets (90 mg total) by mouth daily. 04/08/13   Angelica Chessman, MD  pantoprazole (PROTONIX) 40 MG tablet Take 1 tablet (40 mg total) by mouth daily. 04/08/13   Angelica Chessman, MD  sodium bicarbonate 650 MG tablet Take 1 tablet (650 mg total) by mouth 2 (two) times daily. 04/08/13   Angelica Chessman, MD   BP 112/53  Pulse 51  Temp(Src) 97.4 F (36.3 C) (Oral)  Resp 16  Wt 121 lb 9.6 oz (55.157 kg)  SpO2 100% Physical Exam 1135: Physical examination:  Nursing notes reviewed; Vital signs and O2 SAT reviewed;  Constitutional: Well developed, Well nourished, Well hydrated, In no acute distress; Head:  Normocephalic, atraumatic; Eyes: EOMI, PERRL, No scleral icterus; ENMT: Mouth and pharynx normal, Mucous membranes moist; Neck: Supple, Full range of motion, No lymphadenopathy; Cardiovascular: Regular rate and rhythm, No gallop; Respiratory: Breath sounds coarse & equal bilaterally, No wheezes.  Speaking full sentences with ease, Normal respiratory effort/excursion; Chest: Nontender, Movement normal; Abdomen: Soft, Nontender, Nondistended, Normal bowel sounds; Genitourinary: No CVA tenderness; Extremities: Pulses normal, No tenderness, +1 pedal edema feet to thighs bilaterally without calf asymmetry.; Neuro: AA&Ox3, Major CN grossly intact.  Speech clear. No gross focal motor or sensory deficits in extremities.; Skin: Color normal, Warm, Dry.   ED Course  Procedures     EKG Interpretation   Date/Time:  Tuesday Jun 25 2013 10:33:11 EDT Ventricular Rate:  52 PR Interval:  224 QRS Duration: 96 QT Interval:  526 QTC Calculation: 489 R Axis:   120 Text Interpretation:  Sinus bradycardia with 1st degree A-V block Low  voltage QRS Left posterior fascicular block Cannot rule out Anterior  infarct , age undetermined Abnormal ECG When compared with ECG of  03/06/2013 First degree A-V block is now  Present Confirmed by [facility]  MD,  Nunzio Cory [phone]) on 06/25/2013 12:03:13 PM      MDM  MDM Reviewed: previous chart, nursing note and vitals Reviewed previous: labs and ECG Interpretation: labs, x-ray and ECG    Results for orders placed during the hospital encounter of 06/25/13  CBC      Result Value Ref Range   WBC 5.0  4.0 - 10.5 K/uL   RBC 3.32 (*) 3.87 - 5.11 MIL/uL   Hemoglobin 10.0 (*) 12.0 - 15.0 g/dL   HCT 30.1 (*) 36.0 - 46.0 %   MCV 90.7  78.0 - 100.0 fL   MCH 30.1  26.0 - 34.0 pg   MCHC 33.2  30.0 - 36.0 g/dL   RDW 17.4 (*) 11.5 - 15.5 %   Platelets 69 (*) 150 - 400 K/uL  BASIC METABOLIC PANEL      Result Value Ref Range   Sodium 137  137 - 147 mEq/L   Potassium 4.6  3.7 - 5.3 mEq/L   Chloride 99  96 - 112 mEq/L  CO2 26  19 - 32 mEq/L   Glucose, Bld 100 (*) 70 - 99 mg/dL   BUN 45 (*) 6 - 23 mg/dL   Creatinine, Ser 1.43 (*) 0.50 - 1.10 mg/dL   Calcium 8.5  8.4 - 10.5 mg/dL   GFR calc non Af Amer 38 (*) >90 mL/min   GFR calc Af Amer 44 (*) >90 mL/min  PRO B NATRIURETIC PEPTIDE      Result Value Ref Range   Pro B Natriuretic peptide (BNP) 27880.0 (*) 0 - 125 pg/mL  I-STAT TROPOININ, ED      Result Value Ref Range   Troponin i, poc 0.03  0.00 - 0.08 ng/mL   Comment 3            Dg Chest 2 View 06/25/2013   CLINICAL DATA:  Exertional chest pain; history of CHF  EXAM: CHEST  2 VIEW  COMPARISON:  AP chest film dated March 15, 2013.  FINDINGS: The left lung is well-expanded and clear. On the right there is new increased density partially obscuring the medial aspect of the hemidiaphragm. The perihilar interstitial markings are increased. A trace of pleural fluid blunts the lateral and posterior costophrenic angles. The cardiac silhouette is enlarged. The pulmonary vascularity is minimally prominent.  IMPRESSION: There is increased density at the right lung base anterior medially worrisome for an infiltrate or atelectasis in the middle lobe. There is likely  low-grade compensated CHF as well. Followup films following therapy are recommended to assure complete clearing.   Electronically Signed   By: David  Martinique   On: 06/25/2013 11:19   Results for [name], [name] (MRN [id number]) as of 06/25/2013 12:57  Ref. Range 03/17/2013 03:30 03/18/2013 05:00 03/19/2013 04:45 06/25/2013 11:29  BUN Latest Range: 6-23 mg/dL 29 (H) 27 (H) 27 (H) 45 (H)  Creatinine Latest Range: 0.50-1.10 mg/dL 1.74 (H) 1.74 (H) 1.69 (H) 1.43 (H)   Results for [name], [name] (MRN [id number]) as of 06/25/2013 12:57  Ref. Range 03/17/2013 03:30 03/18/2013 05:00 06/25/2013 11:29  Hemoglobin Latest Range: 12.0-15.0 g/dL 11.0 (L) 11.4 (L) 10.0 (L)  HCT Latest Range: 36.0-46.0 % 32.8 (L) 34.0 (L) 30.1 (L)  Platelets Latest Range: 150-400 K/uL 120 (L) 127 (L) 69 (L)   Results for [name], [name] (MRN [id number]) as of 06/25/2013 12:57  Ref. Range 08/22/2012 09:30 03/05/2013 22:04 06/25/2013 11:29  Pro B Natriuretic peptide (BNP) Latest Range: 0-125 pg/mL 6331.0 (H) 21512.0 (H) 27880.0 (H)    1255:  BNP elevated; will dose IV lasix. Dx and testing d/w pt and family.  Questions answered.  Verb understanding, agreeable to admit. T/C to Cardiology, case discussed, including:  HPI, pertinent PM/SHx, VS/PE, dx testing, ED course and treatment:  Agreeable to come to ED to evaluate for admission.    Alfonzo Feller, DO 06/28/13 2359

## 2013-06-25 NOTE — ED Notes (Signed)
I gave the patient a cup of ice water. 

## 2013-06-25 NOTE — H&P (Addendum)
History and Physical   Patient ID: [name] [name] MRN: [id number], DOB/AGE: 1951-01-07 63 y.o. Date of Encounter: 06/25/2013  Primary Physician: Angelica Chessman, MD Primary Cardiologist: N[place]  Chief Complaint:  Worsening weight gain  **Patient does not speak English but does understand some. She is accompanied by her son who gave most of the HPI and interpreted as needed. They declined the [employer].** HPI: [name] [name] is a 63 y.o. female non-smoker with a PMH significant for CAD, DM, HTN, and CHF 2/2 pulmonary hypertension (EF 30-35% 02/2013) who presented to the ER c/o worsening weight gain. The patient's son states that over the past week he has noticed his mother's belly enlarging and her having increased SOB with exertion. She is not able to climb one flight of stairs without taking a break.   She weighs herself regularly at home and has gained weight over the past week. Her dry weight is around 110-112. Yesterday she was about 130, this morning she was 122 at home. Her son says she urinated and sweated a lot last night, but overall he thinks she has been urinating less frequently. She also has episodes of increased diaphoresis occasionally at night over the past couple weeks when she has trouble urinating. It is unclear why she has trouble urinating at night as she urinates frequently during the day without any issues.   She has been adherent to her home medications and is strict with her salt intake. She took all of her home meds this morning including Lasix. She denies PND and sleeps on 2 pillows at home. She denies CP, palpitations, fever/chills, recent illness, coughing/wheezing, N/V, BM changes. She is resting comfortably with her son at the bedside.   Her son has expressed that his mother does not want to stay in the hospital and wonders if she can't do Lasix treatment at home.  Of note, she was recently hospitalized in 02/2013 for CHF exacerbation. At this time, she underwent  a cardiac cath that showed severe pulmonary HTN and severe 2 vessel disease - 100% RCA mid with L-R collaterals; Diffuse 80-95% subtotal occlusion of OM2. TTE showed EF 30-35% which was decreased from 50-55% in 08/2012. Management with medical therapy was recommended at this time.   Past Medical History  Diagnosis Date  . Hypertension   . Pneumonia   . CHF (congestive heart failure) 08/2012  . Anemia 08/2012  . UTI (urinary tract infection) 08/22/2012  . Ischemic cardiomyopathy     EF 30-35%  . Coronary artery disease   . Pulmonary hypertension   . NSVT (nonsustained ventricular tachycardia) 02/2013  . Diabetes mellitus     Reportedly diagnosed 2011 but no medication initiated until 04/2011 (Metformin)  . Diabetic peripheral neuropathy     Since 2013    Surgical History:  Past Surgical History  Procedure Laterality Date  . Cesarean section    . Tubal ligation    . Esophagogastroduodenoscopy N/A 03/13/2013    Procedure: ESOPHAGOGASTRODUODENOSCOPY (EGD);  Surgeon: Winfield Cunas., MD;  Location: [facility] ENDOSCOPY;  Service: Endoscopy;  Laterality: N/A;  . Cardiac catheterization  02/2013    severe 2 vessel CAD     I have reviewed the patient's current medications. Prior to Admission medications   Medication Sig Start Date End Date Taking? Authorizing Provider  aspirin 81 MG EC tablet Take 1 tablet (81 mg total) by mouth daily. 04/08/13  Yes Angelica Chessman, MD  carvedilol (COREG) 12.5 MG tablet Take 1 tablet (12.5  mg total) by mouth 2 (two) times daily with a meal. 04/08/13  Yes Angelica Chessman, MD  furosemide (LASIX) 40 MG tablet Take 1 tablet (40 mg total) by mouth daily. 04/08/13  Yes Angelica Chessman, MD  glipiZIDE (GLUCOTROL XL) 2.5 MG 24 hr tablet Take 1 tablet (2.5 mg total) by mouth daily with breakfast. 04/08/13  Yes Angelica Chessman, MD  hydrALAZINE (APRESOLINE) 100 MG tablet Take 100 mg by mouth 2 (two) times daily.   Yes Historical Provider, MD  isosorbide mononitrate (IMDUR) 60  MG 24 hr tablet Take 60 mg by mouth 2 (two) times daily.   Yes Historical Provider, MD  Loperamide HCl (ANTI-DIARRHEAL PO) Take 2 tablets by mouth daily as needed (diarrhea).   Yes Historical Provider, MD  sodium bicarbonate 650 MG tablet Take 1 tablet (650 mg total) by mouth 2 (two) times daily. 04/08/13  Yes Angelica Chessman, MD   Allergies: No Known Allergies  History   Social History  . Marital Status: Married    Spouse Name: N/A    Number of Children: N/A  . Years of Education: N/A   Occupational History  . Not on file.   Social History Main Topics  . Smoking status: Never Smoker   . Smokeless tobacco: Never Used  . Alcohol Use: No  . Drug Use: No  . Sexual Activity: Not on file   Other Topics Concern  . Not on file   Social History Narrative   Patient lives with husband and son. Currently without insurance and therefore tries not seek medical care dt financial concern.     Family History  Problem Relation Age of Onset  . Diabetes Brother     deceased in 34V dt DM complications   No family status information on file.    Review of Systems:   Full 14-point review of systems otherwise negative except as noted above.  Physical Exam: Blood pressure 143/69, pulse 54, temperature 97.4 F (36.3 C), temperature source Oral, resp. rate 16, weight 121 lb 9.6 oz (55.157 kg), SpO2 98.00%. General: Well developed, well nourished,female in no acute distress. Head: Normocephalic, atraumatic, sclera non-icteric, no xanthomas, nares are without discharge. Dentition: poor dental hygiene  Neck: No carotid bruits. JVP elevated to 8cm. No thyromegally. Lungs: Good expansion bilaterally without wheezes or rhonchi. Unlabored.  Heart: Regular rate and rhythm with S1 S2.  No S3 or S4.  No rubs, or gallops appreciated. Grade II systolic murmur at LUSB. Abdomen: Distended but soft, non-tender with normoactive bowel sounds. No hepatomegaly. No rebound/guarding. No obvious abdominal  masses. Msk:  Strength and tone appear normal for age. No joint deformities or effusions, no spine or costo-vertebral angle tenderness. Extremities: No clubbing or cyanosis. 1+ edema.  Distal pedal pulses are 2+ in 4 extrem. Neuro: Alert and oriented X 3. Moves all extremities spontaneously. No focal deficits noted. Psych:  Responds to questions appropriately with a normal affect. Skin: No rashes or lesions noted  Labs:   Lab Results  Component Value Date   WBC 5.0 06/25/2013   HGB 10.0* 06/25/2013   HCT 30.1* 06/25/2013   MCV 90.7 06/25/2013   PLT 69* 06/25/2013    Recent Labs Lab 06/25/13 1129  NA 137  K 4.6  CL 99  CO2 26  BUN 45*  CREATININE 1.43*  CALCIUM 8.5  GLUCOSE 100*    Recent Labs  06/25/13 1148  TROPIPOC 0.03    Pro B Natriuretic peptide (BNP)  Date/Time Value Ref Range Status  06/25/2013  11:29 AM 27880.0* 0 - 125 pg/mL Final  03/05/2013 10:04 PM 21512.0* 0 - 125 pg/mL Final   Lab Results  Component Value Date   DDIMER 1.89* 03/05/2013    Radiology/Studies: Dg Chest 2 View  06/25/2013   CLINICAL DATA:  Exertional chest pain; history of CHF  EXAM: CHEST  2 VIEW  COMPARISON:  AP chest film dated March 15, 2013.  FINDINGS: The left lung is well-expanded and clear. On the right there is new increased density partially obscuring the medial aspect of the hemidiaphragm. The perihilar interstitial markings are increased. A trace of pleural fluid blunts the lateral and posterior costophrenic angles. The cardiac silhouette is enlarged. The pulmonary vascularity is minimally prominent.  IMPRESSION: There is increased density at the right lung base anterior medially worrisome for an infiltrate or atelectasis in the middle lobe. There is likely low-grade compensated CHF as well. Followup films following therapy are recommended to assure complete clearing.   Electronically Signed   By: David  Martinique   On: 06/25/2013 11:19    ECG:  06/25/13 Sinus brady, 1st degree AV  block, LBBB  ASSESSMENT AND PLAN:  Active Problems:   * No active hospital problems. *  1. Fluid overload 2/2 CHF exacerbation: Patient has a history of chronic sys/dias CHF and was hospitalized for an exacerbation in 02/2013. She now presents with worsening weight gain and SOB despite being adherent to her home meds and strict diet. On presentation to the ER, her weight is 121 which is up about 20 lbs from her dry weight (110-112 lbs). She was given Lasix 40mg  IV in the ER. The cause of her exacerbation is unclear at this time. Another TTE might be warranted to assess worsening cardiac function. Patient is on appropriate treatment for CHF at home except for an ACE-I which is contraindicated due to her kidney function. --Continue Lasix 40mg  IV --Continue carvedilol  --Continue hydralazine --Continue Imdur  2. Hypertension: BP so far have been stable in ER. She took her BP meds this morning prior to arrival.  --Continue carvedilol  --Continue hydralazine --Continue Imdur  3. CAD: Cath in 02/2013 showed severe 2 vessel disease. Medical management was recommended at this time.  --Continue ASA 81mg  daily --Continue atorvastatin 40mg  daily   4. DM: Switch to insulin while in the hospital.    Rondel Baton, PA-S  Signed, Evelene Croon Barrett, PA-C 06/25/2013 2:43 PM Beeper [phone]  As above, patient seen and examined. Briefly she is a 63 year old female with past medical history of coronary artery disease, diabetes, hypertension, ischemic cardiomyopathy, congestive heart failure, renal insufficiency admitted with congestive heart failure. Note patient does not speak english. Patient presents with weight gain of 10 pounds, increased abdominal and lower extremity edema and increased dyspnea on exertion. No chest pain. She is volume overloaded on examination. Plan to admit for IV diuresis. Begin Lasix 40 mg IV twice a day. Follow renal function closely. Continue beta blocker, hydralazine and  nitrates. Once she has been diuresed and her renal function is stable it would be worthwhile to try an ACE inhibitor long-term given cardiomyopathy and diabetes mellitus/renal insufficiency. Continue aspirin and statin for coronary artery disease. Follow I.'s and O., daily weights and continue low-sodium diet. Note patient has thrombocytopenia which appears to be long-standing. However it is worse at present. Repeat CBC tomorrow morning. She may ultimately need a hematology evaluation. She will need a repeat PA and lateral chest x-ray following diuresis. Lelon Perla

## 2013-06-26 DIAGNOSIS — I5023 Acute on chronic systolic (congestive) heart failure: Principal | ICD-10-CM

## 2013-06-26 DIAGNOSIS — I2510 Atherosclerotic heart disease of native coronary artery without angina pectoris: Secondary | ICD-10-CM

## 2013-06-26 DIAGNOSIS — N184 Chronic kidney disease, stage 4 (severe): Secondary | ICD-10-CM

## 2013-06-26 LAB — CBC
HEMATOCRIT: 27.1 % — AB (ref 36.0–46.0)
Hemoglobin: 8.9 g/dL — ABNORMAL LOW (ref 12.0–15.0)
MCH: 30.6 pg (ref 26.0–34.0)
MCHC: 32.8 g/dL (ref 30.0–36.0)
MCV: 93.1 fL (ref 78.0–100.0)
Platelets: 71 10*3/uL — ABNORMAL LOW (ref 150–400)
RBC: 2.91 MIL/uL — AB (ref 3.87–5.11)
RDW: 17.6 % — ABNORMAL HIGH (ref 11.5–15.5)
WBC: 4.2 10*3/uL (ref 4.0–10.5)

## 2013-06-26 LAB — BASIC METABOLIC PANEL
BUN: 47 mg/dL — AB (ref 6–23)
CHLORIDE: 100 meq/L (ref 96–112)
CO2: 25 meq/L (ref 19–32)
Calcium: 8.4 mg/dL (ref 8.4–10.5)
Creatinine, Ser: 1.57 mg/dL — ABNORMAL HIGH (ref 0.50–1.10)
GFR calc Af Amer: 39 mL/min — ABNORMAL LOW (ref >90)
GFR calc non Af Amer: 34 mL/min — ABNORMAL LOW (ref >90)
Glucose, Bld: 54 mg/dL — ABNORMAL LOW (ref 70–99)
Potassium: 3.9 mEq/L (ref 3.7–5.3)
Sodium: 136 mEq/L — ABNORMAL LOW (ref 137–147)

## 2013-06-26 LAB — HEPATIC FUNCTION PANEL
ALBUMIN: 2.3 g/dL — AB (ref 3.5–5.2)
ALT: 9 U/L (ref 0–35)
AST: 21 U/L (ref 0–37)
Alkaline Phosphatase: 135 U/L — ABNORMAL HIGH (ref 39–117)
Bilirubin, Direct: 0.4 mg/dL — ABNORMAL HIGH (ref 0.0–0.3)
Indirect Bilirubin: 0.4 mg/dL (ref 0.3–0.9)
TOTAL PROTEIN: 7.2 g/dL (ref 6.0–8.3)
Total Bilirubin: 0.8 mg/dL (ref 0.3–1.2)

## 2013-06-26 LAB — GLUCOSE, CAPILLARY
GLUCOSE-CAPILLARY: 49 mg/dL — AB (ref 70–99)
GLUCOSE-CAPILLARY: 68 mg/dL — AB (ref 70–99)
GLUCOSE-CAPILLARY: 71 mg/dL (ref 70–99)
GLUCOSE-CAPILLARY: 58 mg/dL — AB (ref 70–99)
Glucose-Capillary: 85 mg/dL (ref 70–99)
Glucose-Capillary: 72 mg/dL (ref 70–99)

## 2013-06-26 MED ORDER — ENOXAPARIN SODIUM 30 MG/0.3ML ~~LOC~~ SOLN
30.0000 mg | Freq: Every day | SUBCUTANEOUS | Status: DC
  Administered 2013-06-26: 30 mg via SUBCUTANEOUS
  Filled 2013-06-26: qty 0.3

## 2013-06-26 MED ORDER — INSULIN ASPART 100 UNIT/ML ~~LOC~~ SOLN
0.0000 [IU] | Freq: Three times a day (TID) | SUBCUTANEOUS | Status: DC
  Administered 2013-06-28 – 2013-06-29 (×4): 2 [IU] via SUBCUTANEOUS
  Administered 2013-06-30: 1 [IU] via SUBCUTANEOUS

## 2013-06-26 NOTE — Progress Notes (Signed)
CRITICAL VALUE ALERT  Critical value received:  CBG  Date of notification:  06/26/13  Time of notification:  0622  Critical value read back:yes  Nurse who received alert:  Shari Prows., RN   MD notified (1st page):  Rosaria Ferries, [facility]  Time of first page:  [phone]  MD notified (2nd page):  Time of second page:  Responding MD:    Time MD responded:

## 2013-06-26 NOTE — Progress Notes (Signed)
    Subjective:  She does not appear SOB flat in bed  Objective:  Vital Signs in the last 24 hours: Temp:  [97.1 F (36.2 C)-97.8 F (36.6 C)] 97.8 F (36.6 C) (05/20 0514) Pulse Rate:  [50-59] 59 (05/20 0514) Resp:  [13-21] 19 (05/20 0514) BP: (112-164)/(51-88) 114/62 mmHg (05/20 0514) SpO2:  [95 %-100 %] 97 % (05/20 0514) Weight:  [121 lb 9.6 oz (55.157 kg)-131 lb (59.421 kg)] 128 lb 8 oz (58.287 kg) (05/20 0514)  Intake/Output from previous day:  Intake/Output Summary (Last 24 hours) at 06/26/13 1007 Last data filed at 06/26/13 0816  Gross per 24 hour  Intake    240 ml  Output      0 ml  Net    240 ml    Physical Exam: General appearance: alert, cooperative and no distress Lungs: clear to auscultation bilaterally Heart: regular rate and rhythm and soft systolic murmur LSB Extremities: no edema   Rate: 58  Rhythm: sinus bradycardia  Lab Results:  Recent Labs  06/25/13 1129 06/26/13 0632  WBC 5.0 4.2  HGB 10.0* 8.9*  PLT 69* 71*    Recent Labs  06/25/13 1129 06/26/13 0632  NA 137 136*  K 4.6 3.9  CL 99 100  CO2 26 25  GLUCOSE 100* 54*  BUN 45* 47*  CREATININE 1.43* 1.57*   No results found for this basename: TROPONINI, CK, MB,  in the last 72 hours No results found for this basename: INR,  in the last 72 hours  Imaging: Imaging results have been reviewed  Cardiac Studies:  Assessment/Plan:  63 yo [name] with poorly controlled DM, HTN, CRI, ICM- EF 30-35% Jan 2015, Severe 2V CAD at cath Jan 2015 (medical Rx), and Pulmonary HTN admitted 06/25/13 with wgt gain, BNP 27K.    Principal Problem:   Acute on chronic systolic CHF (congestive heart failure), NYHA class 3 Active Problems:   CAD - severe 2V at cath 03/08/13   Diabetes mellitus, type 2   Cardiomyopathy, ischemic- EF 30-35% (new LVD)   Pulmonary hypertension- severe, PA 63 mmHg by echo   Chronic systolic congestive heart failure   Chronic kidney disease (CKD), stage IV  (severe)   Anemia    PLAN: She does not appear to have diuresed- I/O +240, wgt up from admission 121-128 ?Marland Kitchen Her BS has been labile, as low as 28 and as high as 218. I have changed her moderate sliding scale coverage to sensitive. Hold Coreg for HR less than 55. Continue IV Lasix- reported dry wgt 110.   Kerin Ransom PA-C Beeper [phone] 06/26/2013, 10:07 AM  I have examined the patient and reviewed assessment and plan and discussed with patient.  Agree with above as stated.  Patient still feels her belly is distended.  Continue diuresis.  Hbg has dropped today. Will need to recheck.  Follow renal function.  She is not very SHOB at this time at rest.   Reviewed Dr. Elmarie Shiley note.  She has been seen by heart failure team.  He was considering referral to them if she failed conventional therapies.  Diuresis may be limited by her renal function.  Jettie Booze

## 2013-06-26 NOTE — Progress Notes (Signed)
CBG 68 upon recheck. Pt. Alert and stable. [name] [name]

## 2013-06-26 NOTE — Care Management Note (Addendum)
    Page 1 of 2   07/09/2013     11:10:47 AM CARE MANAGEMENT NOTE 07/09/2013  Patient:  [name] [name], [name] [name]   Account Number:  [id number]  Date Initiated:  06/26/2013  Documentation initiated by:  [facility]  Subjective/Objective Assessment:   63 y.o. female non-smoker with a PMH significant for CAD, DM, HTN, and CHF 2/2 pulmonary hypertension who presented to the ER c/o worsening weight gain.//Home with children     Action/Plan:   IV diureses.// Access for [facility] services.   Anticipated DC Date:     Anticipated DC Plan:  [place]  In-house referral  Interpreting [place]  CM consult  [facility]      [facility] Choice  HOME HEALTH   Choice offered to / List presented to:  C-1 Patient        [place] arranged  HH-1 RN  [place].   Status of service:  Completed, signed off Medicare Important Message given?   (If response is "NO", the following Medicare IM given date fields will be blank) Date Medicare IM given:   Date Additional Medicare IM given:    Discharge Disposition:  [place]  Per UR Regulation:  Reviewed for med. necessity/level of care/duration of stay  If discussed at [place] of Stay Meetings, dates discussed:   07/04/2013  07/09/2013    Comments:  6/2  1054a debbie Lathen Seal rn,bsn spoke w pt. she is agreeable to hhrn. no pref to agency. will use [facility] since they work w self pay pt's. ref to donna w [facility] for hhrn. poss dc on 6-3.  5/26 1353 debbie Claudett Bayly rn,bsn gave pt prescription discount card that may help w brand name meds. left pt [place] list for clinics and also inform on [place] and wellness clinic.

## 2013-06-27 ENCOUNTER — Encounter (HOSPITAL_COMMUNITY): Payer: Self-pay | Admitting: Interventional Cardiology

## 2013-06-27 ENCOUNTER — Inpatient Hospital Stay (HOSPITAL_COMMUNITY): Payer: Medicaid Other

## 2013-06-27 DIAGNOSIS — D649 Anemia, unspecified: Secondary | ICD-10-CM

## 2013-06-27 DIAGNOSIS — R112 Nausea with vomiting, unspecified: Secondary | ICD-10-CM

## 2013-06-27 DIAGNOSIS — K920 Hematemesis: Secondary | ICD-10-CM

## 2013-06-27 DIAGNOSIS — N189 Chronic kidney disease, unspecified: Secondary | ICD-10-CM | POA: Diagnosis present

## 2013-06-27 DIAGNOSIS — I509 Heart failure, unspecified: Secondary | ICD-10-CM

## 2013-06-27 LAB — CBC WITH DIFFERENTIAL/PLATELET
BASOS PCT: 1 % (ref 0–1)
Basophils Absolute: 0 10*3/uL (ref 0.0–0.1)
EOS PCT: 10 % — AB (ref 0–5)
Eosinophils Absolute: 0.4 10*3/uL (ref 0.0–0.7)
HEMATOCRIT: 26.6 % — AB (ref 36.0–46.0)
Hemoglobin: 8.7 g/dL — ABNORMAL LOW (ref 12.0–15.0)
Lymphocytes Relative: 15 % (ref 12–46)
Lymphs Abs: 0.6 10*3/uL — ABNORMAL LOW (ref 0.7–4.0)
MCH: 30.7 pg (ref 26.0–34.0)
MCHC: 32.7 g/dL (ref 30.0–36.0)
MCV: 94 fL (ref 78.0–100.0)
MONO ABS: 0.3 10*3/uL (ref 0.1–1.0)
MONOS PCT: 7 % (ref 3–12)
Neutro Abs: 2.7 10*3/uL (ref 1.7–7.7)
Neutrophils Relative %: 67 % (ref 43–77)
Platelets: 81 10*3/uL — ABNORMAL LOW (ref 150–400)
RBC: 2.83 MIL/uL — ABNORMAL LOW (ref 3.87–5.11)
RDW: 17.7 % — AB (ref 11.5–15.5)
WBC: 4 10*3/uL (ref 4.0–10.5)

## 2013-06-27 LAB — GLUCOSE, CAPILLARY
GLUCOSE-CAPILLARY: 131 mg/dL — AB (ref 70–99)
Glucose-Capillary: 92 mg/dL (ref 70–99)
Glucose-Capillary: 101 mg/dL — ABNORMAL HIGH (ref 70–99)
Glucose-Capillary: 128 mg/dL — ABNORMAL HIGH (ref 70–99)

## 2013-06-27 LAB — BASIC METABOLIC PANEL
BUN: 50 mg/dL — AB (ref 6–23)
CALCIUM: 8.3 mg/dL — AB (ref 8.4–10.5)
CO2: 25 mEq/L (ref 19–32)
CREATININE: 1.88 mg/dL — AB (ref 0.50–1.10)
Chloride: 97 mEq/L (ref 96–112)
GFR calc non Af Amer: 27 mL/min — ABNORMAL LOW (ref >90)
GFR, EST AFRICAN AMERICAN: 32 mL/min — AB (ref >90)
Glucose, Bld: 87 mg/dL (ref 70–99)
POTASSIUM: 4.2 meq/L (ref 3.7–5.3)
Sodium: 134 mEq/L — ABNORMAL LOW (ref 137–147)

## 2013-06-27 MED ORDER — SODIUM CHLORIDE 0.9 % IJ SOLN
10.0000 mL | INTRAMUSCULAR | Status: DC | PRN
  Administered 2013-06-28: 10 mL

## 2013-06-27 MED ORDER — FUROSEMIDE 10 MG/ML IJ SOLN
80.0000 mg | Freq: Three times a day (TID) | INTRAMUSCULAR | Status: DC
  Administered 2013-06-27 – 2013-06-30 (×8): 80 mg via INTRAVENOUS
  Filled 2013-06-27 (×11): qty 8

## 2013-06-27 MED ORDER — MILRINONE IN DEXTROSE 20 MG/100ML IV SOLN
0.1250 ug/kg/min | INTRAVENOUS | Status: DC
  Administered 2013-06-27 – 2013-07-01 (×5): 0.25 ug/kg/min via INTRAVENOUS
  Administered 2013-07-02: 0.125 ug/kg/min via INTRAVENOUS
  Administered 2013-07-02: 0.25 ug/kg/min via INTRAVENOUS
  Administered 2013-07-03: 0.125 ug/kg/min via INTRAVENOUS
  Filled 2013-06-27 (×9): qty 100

## 2013-06-27 MED ORDER — PANTOPRAZOLE SODIUM 40 MG IV SOLR
40.0000 mg | Freq: Two times a day (BID) | INTRAVENOUS | Status: DC
  Administered 2013-06-27 (×3): 40 mg via INTRAVENOUS
  Filled 2013-06-27 (×5): qty 40

## 2013-06-27 NOTE — Progress Notes (Signed)
    Subjective:  She does not appear SOB flat in bed. Had some blood streaked emesis x1 last night.   Objective:  Vital Signs in the last 24 hours: Temp:  [98.1 F (36.7 C)-98.6 F (37 C)] 98.1 F (36.7 C) (05/21 0439) Pulse Rate:  [60-63] 61 (05/21 0439) Resp:  [16-18] 16 (05/21 0439) BP: (121-127)/(59-70) 124/70 mmHg (05/21 0439) SpO2:  [95 %] 95 % (05/21 0439) Weight:  [130 lb 8.2 oz (59.2 kg)] 130 lb 8.2 oz (59.2 kg) (05/21 0439)  Intake/Output from previous day:  Intake/Output Summary (Last 24 hours) at 06/27/13 1302 Last data filed at 06/27/13 1044  Gross per 24 hour  Intake   1440 ml  Output    450 ml  Net    990 ml    Physical Exam: General appearance: alert, cooperative and no distress Lungs: clear to auscultation bilaterally Heart: regular rate and rhythm and soft systolic murmur LSB Extremities: no edema Abdomen distended   Rate: 58  Rhythm: sinus bradycardia  Lab Results:  Recent Labs  06/26/13 0632 06/27/13 0540  WBC 4.2 4.0  HGB 8.9* 8.7*  PLT 71* 81*    Recent Labs  06/26/13 0632 06/27/13 0540  NA 136* 134*  K 3.9 4.2  CL 100 97  CO2 25 25  GLUCOSE 54* 87  BUN 47* 50*  CREATININE 1.57* 1.88*   No results found for this basename: TROPONINI, CK, MB,  in the last 72 hours No results found for this basename: INR,  in the last 72 hours  Imaging: Imaging results have been reviewed  Cardiac Studies:  Assessment/Plan:  63 yo [name] with poorly controlled DM, HTN, CRI, ICM- EF 30-35% Jan 2015, Severe 2V CAD at cath Jan 2015 (medical Rx), and Pulmonary HTN admitted 06/25/13 with wgt gain, BNP 27K.    Principal Problem:   Acute on chronic systolic CHF (congestive heart failure), NYHA class 3 Active Problems:   Anemia   Diabetes mellitus, type 2   CAD - severe 2V at cath 03/08/13   Cardiomyopathy, ischemic- EF 30-35% (new LVD)   Pulmonary hypertension- severe, PA 63 mmHg by echo   Chronic systolic congestive heart failure  Chronic kidney disease (CKD), stage IV (severe)    PLAN:   Patient still feels her belly is distended.  Increased Cr with diuresis.  Hbg has dropped today.   Follow renal function.  She is not very SHOB at this time at rest.   Reviewed Dr. Elmarie Shiley note.  She has been seen by heart failure team.  He was considering referral to them if she failed conventional therapies.  Diuresis may be limited by her renal function.  GI consult as well given anemia. Hemoccult ordered.  Jettie Booze

## 2013-06-27 NOTE — Plan of Care (Signed)
Called by RN at 1:26am to report that about an hour ago patient vomited x 2 about 10-15 cc and there was some "blood streaking" to some of the saliva post-emesis.  Patient is now asleep and resting comfortably.  VSS.  Noted Hb had slightly decreased on AM labs from 06/26/13.    Plan: 1-D/C PPx dose lovenox and ordered SCD's for now. 2-CBC with differential ordered for AM to f/u on Hb. 3-Asked RN to page me if vomiting returns. 4-Add PPI bid. 5-Zofran PRN ordered.  Charlton Haws, MD [facility] Physician

## 2013-06-27 NOTE — Consult Note (Signed)
Consultation  Referring Provider: [facility]     Primary Care Physician:  Angelica Chessman, MD Primary Gastroenterologist:  Althia Forts      Reason for Consultation: hematemesis / anemia           HPI:   [name] [name] is a 64 y.o. female from [place] with multiple medical problems including, but not limited to CHF, CAD, HTN,CKD and diabetes.  Cardiac cath in January of this year showed severe pulmonary HTN. TTE showed EF 30-35% , decreased from 50-55% in 08/2012. Management with medical therapy was recommended.   Patient admitted two days ago for weight gain, SOB, increased abdominal girth. BNP [id number]. Cardiology is following for acute on chronic systolic CHF. She is getting IV diuretics. We were called to see patient for hematemesis. She had one, possibly two episodes of vomiting during the night. Emesis contained some streaks of blood per RN. Patient had been on anticoagulant for DVT prophylaxis. No nausea or vomiting prior to admission but patient's appetite has been poor, she feels "full". Patient was seen inpatient by [facility] in Febuary 2015 for epigastric pain. EGD done and normal. Ascitic fluid noted on CTscan at that time though no ascites on u/s guided paracentesis.    Past Medical History  Diagnosis Date  . Hypertension   . Pneumonia   . CHF (congestive heart failure) 08/2012  . Anemia 08/2012  . UTI (urinary tract infection) 08/22/2012  . Ischemic cardiomyopathy     EF 30-35%  . Coronary artery disease   . Pulmonary hypertension   . NSVT (nonsustained ventricular tachycardia) 02/2013  . Diabetes mellitus     Reportedly diagnosed 2011 but no medication initiated until 04/2011 (Metformin)  . Diabetic peripheral neuropathy     Since 2013  . Thrombocytopenia     Past Surgical History  Procedure Laterality Date  . Cesarean section    . Tubal ligation    . Esophagogastroduodenoscopy N/A 03/13/2013    Procedure: ESOPHAGOGASTRODUODENOSCOPY (EGD);  Surgeon: Winfield Cunas., MD;  Location: [facility] ENDOSCOPY;  Service: Endoscopy;  Laterality: N/A;  . Cardiac catheterization  02/2013    severe 2 vessel CAD    Family History  Problem Relation Age of Onset  . Diabetes Brother     deceased in 21H dt DM complications     History  Substance Use Topics  . Smoking status: Never Smoker   . Smokeless tobacco: Never Used  . Alcohol Use: No    Prior to Admission medications   Medication Sig Start Date End Date Taking? Authorizing Provider  aspirin 81 MG EC tablet Take 1 tablet (81 mg total) by mouth daily. 04/08/13  Yes Angelica Chessman, MD  carvedilol (COREG) 12.5 MG tablet Take 1 tablet (12.5 mg total) by mouth 2 (two) times daily with a meal. 04/08/13  Yes Angelica Chessman, MD  furosemide (LASIX) 40 MG tablet Take 1 tablet (40 mg total) by mouth daily. 04/08/13  Yes Angelica Chessman, MD  glipiZIDE (GLUCOTROL XL) 2.5 MG 24 hr tablet Take 1 tablet (2.5 mg total) by mouth daily with breakfast. 04/08/13  Yes Angelica Chessman, MD  hydrALAZINE (APRESOLINE) 100 MG tablet Take 100 mg by mouth 2 (two) times daily.   Yes Historical Provider, MD  isosorbide mononitrate (IMDUR) 60 MG 24 hr tablet Take 60 mg by mouth 2 (two) times daily.   Yes Historical Provider, MD  Loperamide HCl (ANTI-DIARRHEAL PO) Take 2 tablets by mouth daily as needed (diarrhea).   Yes Historical  Provider, MD  sodium bicarbonate 650 MG tablet Take 1 tablet (650 mg total) by mouth 2 (two) times daily. 04/08/13  Yes Angelica Chessman, MD    Current Facility-Administered Medications  Medication Dose Route Frequency Provider Last Rate Last Dose  . 0.9 %  sodium chloride infusion  250 mL Intravenous PRN Rhonda G Barrett, PA-C      . acetaminophen (TYLENOL) tablet 650 mg  650 mg Oral Q4H PRN Rhonda G Barrett, PA-C      . ALPRAZolam Duanne Moron) tablet 0.25 mg  0.25 mg Oral BID PRN Evelene Croon Barrett, PA-C      . aspirin EC tablet 81 mg  81 mg Oral Daily Lelon Perla, MD   81 mg at 06/27/13 1032  . carvedilol (COREG)  tablet 12.5 mg  12.5 mg Oral BID WC Doreene Burke Kilroy, PA-C   12.5 mg at 06/27/13 1031  . hydrALAZINE (APRESOLINE) tablet 100 mg  100 mg Oral BID Doreene Burke Kilroy, PA-C   100 mg at 06/27/13 1032  . insulin aspart (novoLOG) injection 0-9 Units  0-9 Units Subcutaneous TID WC Luke K Kilroy, PA-C      . isosorbide mononitrate (IMDUR) 24 hr tablet 60 mg  60 mg Oral BID Rhonda G Barrett, PA-C   60 mg at 06/27/13 1031  . loperamide (IMODIUM) capsule 2 mg  2 mg Oral PRN Rhonda G Barrett, PA-C      . nitroGLYCERIN (NITROSTAT) SL tablet 0.4 mg  0.4 mg Sublingual Q5 Min x 3 PRN Rhonda G Barrett, PA-C      . ondansetron (ZOFRAN) injection 4 mg  4 mg Intravenous Q6H PRN Rhonda G Barrett, PA-C   4 mg at 06/26/13 1724  . pantoprazole (PROTONIX) injection 40 mg  40 mg Intravenous Q12H Charlton Haws, MD   40 mg at 06/27/13 1039  . sodium bicarbonate tablet 650 mg  650 mg Oral BID Evelene Croon Barrett, PA-C   650 mg at 06/27/13 1031  . sodium chloride 0.9 % injection 3 mL  3 mL Intravenous Q12H Rhonda G Barrett, PA-C   3 mL at 06/27/13 1039  . sodium chloride 0.9 % injection 3 mL  3 mL Intravenous PRN Rhonda G Barrett, PA-C      . zolpidem (AMBIEN) tablet 5 mg  5 mg Oral QHS PRN Rhonda G Barrett, PA-C        Allergies as of 06/25/2013  . (No Known Allergies)   Review of Systems:    All systems reviewed and negative except where noted in HPI.   Physical Exam:  Vital signs in last 24 hours: Temp:  [98.1 F (36.7 C)-98.6 F (37 C)] 98.1 F (36.7 C) (05/21 0439) Pulse Rate:  [60-63] 61 (05/21 0439) Resp:  [16-18] 16 (05/21 0439) BP: (121-127)/(59-70) 124/70 mmHg (05/21 0439) SpO2:  [95 %] 95 % (05/21 0439) Weight:  [130 lb 8.2 oz (59.2 kg)] 130 lb 8.2 oz (59.2 kg) (05/21 0439) Last BM Date: 06/25/13 General:   Pleasant asian female in NAD Head:  Normocephalic and atraumatic. Eyes:   No icterus.   Conjunctiva pink. Ears:  Normal auditory acuity. Neck:  Supple; no masses felt Lungs:  Respirations even and  unlabored. Decreased breath sounds at bilateral bases.  No wheezes.  Heart:  Regular rate and rhythm;  murmur heard. Abdomen:  Soft, slightly distended, nontender. Normal bowel sounds. Abdominal wall edema. No appreciable masses or hepatomegaly.  Msk:  Symmetrical without gross deformities.  Extremities:  Without edema. Neurologic:  Alert  and  oriented x4;  grossly normal neurologically. Skin:  Intact without significant lesions or rashes. Cervical Nodes:  No significant cervical adenopathy. Psych:  Alert and cooperative. Normal affect.  LAB RESULTS:  Recent Labs  06/25/13 1129 06/26/13 0632 06/27/13 0540  WBC 5.0 4.2 4.0  HGB 10.0* 8.9* 8.7*  HCT 30.1* 27.1* 26.6*  PLT 69* 71* 81*   BMET  Recent Labs  06/25/13 1129 06/26/13 0632 06/27/13 0540  NA 137 136* 134*  K 4.6 3.9 4.2  CL 99 100 97  CO2 26 25 25   GLUCOSE 100* 54* 87  BUN 45* 47* 50*  CREATININE 1.43* 1.57* 1.88*  CALCIUM 8.5 8.4 8.3*   LFT  Recent Labs  06/26/13 0632  PROT 7.2  ALBUMIN 2.3*  AST 21  ALT 9  ALKPHOS 135*  BILITOT 0.8  BILIDIR 0.4*  IBILI 0.4   PREVIOUS ENDOSCOPIES:            EGD Feb 2015 for abdominal pain. Normal exam   Impression / Plan:   70. 63 year old asian female (limited English) with episode of blood streaked emesis during the night (was anticoagulated for DVT prophylaxis). She denies abdominal pain. Hgb stable overnight. No melena, in fact no BM in 3 days. Patient had normal EGD in February 2015 so doubt significant upper GI pathology though patient does take daily aspirin at home. Will monitor for now. Continue BID IV PPI. Repeat EGD if significant recurrent hematemesis.    2. Normocytic anemia. Baseline hgb around 11-12. At time of this admission hgb was 10, it has drifted further to 8.7 today. She also has acute on chronic thrombocytopenia (doubt HIT as she was on Lovenox, not Heparin).  Patient has CKD, creatinine higher than baseline so wonder if renal failure is  contributing to progressive anemia. May need colonoscopy at some point.   3. History of ascites Feb 2015. This may have been cardiac related, she has anasarca this admission, BNP elevated. No evidence for chronic liver disease.  Of note viral hepatitis studies in Feb were negative.   4. Poor appetite, feels "full" . This may be secondary to CHF. Normal EGD Feb 2015. Non-contrast imaging studies in past few months with acute GI findings. She did have some mesenteric vessel disease on MRI , no sure clinically significant. Study limited by motion   Thanks   LOS: 2 days   Willia Craze  06/27/2013, 12:27 PM     Attending physician's note   I have taken a history, examined the patient and reviewed the chart. I agree with the Advanced Practitioner's note, impression and recommendations. Self limited N/V with small volume hematemesis. She had a normal EGD in Feb. 2015. Monitor for recurrent hematemesis. Trend Hb/Hct. PPI bid for now. No plans for repeat EGD at this time.    Ladene Artist, MD [facility]

## 2013-06-27 NOTE — Progress Notes (Signed)
Pt had blood streak vomitus. Dr Donneta Romberg made aware with orders noted.observe pt closely

## 2013-06-27 NOTE — Progress Notes (Signed)
Peripherally Inserted Central Catheter/Midline Placement  The IV Nurse has discussed with the patient and/or persons authorized to consent for the patient, the purpose of this procedure and the potential benefits and risks involved with this procedure.  The benefits include less needle sticks, lab draws from the catheter and patient may be discharged home with the catheter.  Risks include, but not limited to, infection, bleeding, blood clot (thrombus formation), and puncture of an artery; nerve damage and irregular heat beat.  Alternatives to this procedure were also discussed.  Consent obtained via interpreter via 3 way telephone- [name] ID # [id number].  Pt cooperative during procedure.  PICC/Midline Placement Documentation  PICC / Midline Double Lumen 19/14/78 PICC Right Basilic 37 cm 0 cm (Active)       [name] [name] [facility] 06/27/2013, 5:17 PM

## 2013-06-27 NOTE — Progress Notes (Addendum)
    Subjective:   Had nausea last night. Ow resolved. Sitting up eating lunch. Belly feels full. Denies SOB or orthopnea. Weight continues to climb. Renal function worse.  1.57 -> 1.88. Has been off lasix.   Objective:  Vital Signs in the last 24 hours: Temp:  [98.1 F (36.7 C)-98.6 F (37 C)] 98.1 F (36.7 C) (05/21 0439) Pulse Rate:  [60-63] 61 (05/21 0439) Resp:  [16-18] 16 (05/21 0439) BP: (121-127)/(59-70) 124/70 mmHg (05/21 0439) SpO2:  [95 %] 95 % (05/21 0439) Weight:  [59.2 kg (130 lb 8.2 oz)] 59.2 kg (130 lb 8.2 oz) (05/21 0439)  Intake/Output from previous day:  Intake/Output Summary (Last 24 hours) at 06/27/13 1251 Last data filed at 06/27/13 1044  Gross per 24 hour  Intake   1440 ml  Output    450 ml  Net    990 ml    Physical Exam: General:  Well appearing. No resp difficulty HEENT: normal Neck: supple. JVP to jaw Carotids 2+ bilat; no bruits. No lymphadenopathy or thryomegaly appreciated. Cor: PMI nondisplaced. Regular rate & rhythm. 2/6 MR Lungs: clear Abdomen: soft, nontender, + distended. No hepatosplenomegaly. No bruits or masses. Good bowel sounds. Extremities: no cyanosis, clubbing, rash, 1+ edema Neuro: alert & orientedx3, cranial nerves grossly intact. moves all 4 extremities w/o difficulty. Affect pleasant   Rate: 58  Rhythm: sinus bradycardia  Lab Results:  Recent Labs  06/26/13 0632 06/27/13 0540  WBC 4.2 4.0  HGB 8.9* 8.7*  PLT 71* 81*    Recent Labs  06/26/13 0632 06/27/13 0540  NA 136* 134*  K 3.9 4.2  CL 100 97  CO2 25 25  GLUCOSE 54* 87  BUN 47* 50*  CREATININE 1.57* 1.88*   No results found for this basename: TROPONINI, CK, MB,  in the last 72 hours No results found for this basename: INR,  in the last 72 hours  Imaging: Imaging results have been reviewed  Cardiac Studies:  Assessment/Plan:  63 yo [name] with poorly controlled DM, HTN, CRI, ICM- EF 30-35% Jan 2015, Severe 2V CAD at cath Jan 2015 (medical  Rx), and Pulmonary HTN admitted 06/25/13 with wgt gain, BNP 27K.   1) Acute on chronic systolic HF 2) Acute on chronic renal failure stage IV in setting of cardiorenal syndrome 3) CAD 4) DM2, poorly controlled 5) Thrombocytopenia (previously 100-120k; now 70-80k). Liver imaging normal 2/15 6) Protein calorie malnutrition. Albumin 2.3  PLAN:   Interesting situation. She had a very similar presentation several months ago when she was admitted with anasarca. RHC showed markedly elevated biventricular pressures with normal cardiac output. She was started on milrinone and volume status improved very quickly though urine output never was really brisk. Improvement was rapid and truthfully was a bit of a mystery to [place].   She currently has evidence of recurrent low output syndrome with volume overload. Will place PICC line and start milrinone. Once milrinone started will give lasix 80 q8. If not improving can move to 2H for monitoring of co-ox and CVP. I am unsure why platelets are low. No evidence of cirrhosis of previous CT. Will eventually need RUQ u/s to look at live and portal pressures. No ACE or b-blocker currently.   HF team will follow.   Shaune Pascal Phinneas Shakoor,MD 1:04 PM

## 2013-06-27 NOTE — Progress Notes (Signed)
Milrinone started per MD orders, patient tolerated well, will continue to monitor

## 2013-06-27 NOTE — Progress Notes (Signed)
Son Mason at bedside has some questions regarding his mothers cardiac prognosis and would like to speak to Dr. Zoila Shutter [phone]. Thanks! Lorri Frederick, RN

## 2013-06-28 ENCOUNTER — Encounter (HOSPITAL_COMMUNITY): Payer: Self-pay | Admitting: *Deleted

## 2013-06-28 DIAGNOSIS — D696 Thrombocytopenia, unspecified: Secondary | ICD-10-CM

## 2013-06-28 LAB — CBC WITH DIFFERENTIAL/PLATELET
BASOS PCT: 1 % (ref 0–1)
Basophils Absolute: 0 10*3/uL (ref 0.0–0.1)
Eosinophils Absolute: 0.7 10*3/uL (ref 0.0–0.7)
Eosinophils Relative: 16 % — ABNORMAL HIGH (ref 0–5)
HEMATOCRIT: 25.6 % — AB (ref 36.0–46.0)
HEMOGLOBIN: 8.5 g/dL — AB (ref 12.0–15.0)
Lymphocytes Relative: 10 % — ABNORMAL LOW (ref 12–46)
Lymphs Abs: 0.4 10*3/uL — ABNORMAL LOW (ref 0.7–4.0)
MCH: 30.8 pg (ref 26.0–34.0)
MCHC: 33.2 g/dL (ref 30.0–36.0)
MCV: 92.8 fL (ref 78.0–100.0)
MONO ABS: 0.3 10*3/uL (ref 0.1–1.0)
MONOS PCT: 6 % (ref 3–12)
NEUTROS ABS: 2.9 10*3/uL (ref 1.7–7.7)
Neutrophils Relative %: 67 % (ref 43–77)
Platelets: 70 10*3/uL — ABNORMAL LOW (ref 150–400)
RBC: 2.76 MIL/uL — AB (ref 3.87–5.11)
RDW: 17.4 % — ABNORMAL HIGH (ref 11.5–15.5)
WBC: 4.3 10*3/uL (ref 4.0–10.5)

## 2013-06-28 LAB — BASIC METABOLIC PANEL
BUN: 56 mg/dL — ABNORMAL HIGH (ref 6–23)
CHLORIDE: 94 meq/L — AB (ref 96–112)
CO2: 25 mEq/L (ref 19–32)
Calcium: 8.6 mg/dL (ref 8.4–10.5)
Creatinine, Ser: 2.13 mg/dL — ABNORMAL HIGH (ref 0.50–1.10)
GFR calc non Af Amer: 24 mL/min — ABNORMAL LOW (ref >90)
GFR, EST AFRICAN AMERICAN: 27 mL/min — AB (ref >90)
Glucose, Bld: 111 mg/dL — ABNORMAL HIGH (ref 70–99)
POTASSIUM: 4.1 meq/L (ref 3.7–5.3)
SODIUM: 132 meq/L — AB (ref 137–147)

## 2013-06-28 LAB — URINALYSIS, ROUTINE W REFLEX MICROSCOPIC
Bilirubin Urine: NEGATIVE
GLUCOSE, UA: NEGATIVE mg/dL
Hgb urine dipstick: NEGATIVE
KETONES UR: NEGATIVE mg/dL
LEUKOCYTES UA: NEGATIVE
Nitrite: NEGATIVE
PH: 6.5 (ref 5.0–8.0)
Protein, ur: NEGATIVE mg/dL
SPECIFIC GRAVITY, URINE: 1.012 (ref 1.005–1.030)
Urobilinogen, UA: 1 mg/dL (ref 0.0–1.0)

## 2013-06-28 LAB — GLUCOSE, CAPILLARY
GLUCOSE-CAPILLARY: 111 mg/dL — AB (ref 70–99)
Glucose-Capillary: 160 mg/dL — ABNORMAL HIGH (ref 70–99)
Glucose-Capillary: 165 mg/dL — ABNORMAL HIGH (ref 70–99)
Glucose-Capillary: 219 mg/dL — ABNORMAL HIGH (ref 70–99)

## 2013-06-28 LAB — CARBOXYHEMOGLOBIN
CARBOXYHEMOGLOBIN: 1.7 % — AB (ref 0.5–1.5)
Methemoglobin: 0.9 % (ref 0.0–1.5)
O2 Saturation: 61.6 %
Total hemoglobin: 8.6 g/dL — ABNORMAL LOW (ref 12.0–16.0)

## 2013-06-28 MED ORDER — PANTOPRAZOLE SODIUM 40 MG PO TBEC
40.0000 mg | DELAYED_RELEASE_TABLET | Freq: Every day | ORAL | Status: DC
  Administered 2013-06-28 – 2013-07-10 (×13): 40 mg via ORAL
  Filled 2013-06-28 (×13): qty 1

## 2013-06-28 MED ORDER — METOLAZONE 5 MG PO TABS
5.0000 mg | ORAL_TABLET | Freq: Two times a day (BID) | ORAL | Status: DC
Start: 2013-06-28 — End: 2013-06-30
  Administered 2013-06-28 – 2013-06-29 (×3): 5 mg via ORAL
  Filled 2013-06-28 (×6): qty 1

## 2013-06-28 NOTE — Progress Notes (Signed)
Report given to Janett Billow RN on [place], to be transferred to [facility] 20, Hazle Nordmann RN

## 2013-06-28 NOTE — Progress Notes (Signed)
ADVANCED HF ROUNDING NOTE  Subjective:   Yesterday she was started on Milrinone at 0.25 mcg due to worsening renal function and concern for low output. Weight unchanged. Evaluated by GI. PPI started bid.   Denies dyspnea/CP. Feels swollen.   Creatinine 1.5> 1.8>2.1  CO-OX pending  Objective:  Vital Signs in the last 24 hours: Temp:  [97.4 F (36.3 C)-98.2 F (36.8 C)] 98.2 F (36.8 C) (05/22 0501) Pulse Rate:  [50-68] 68 (05/22 0501) Resp:  [16-18] 16 (05/22 0501) BP: (114-125)/(60-64) 114/60 mmHg (05/22 0501) SpO2:  [93 %-96 %] 93 % (05/22 0501) Weight:  [130 lb 12.8 oz (59.33 kg)] 130 lb 12.8 oz (59.33 kg) (05/22 0501)  Intake/Output from previous day:  Intake/Output Summary (Last 24 hours) at 06/28/13 1005 Last data filed at 06/28/13 0853  Gross per 24 hour  Intake 633.03 ml  Output    600 ml  Net  33.03 ml    Physical Exam: General:  Elderly. Chronically ill appearing. No resp difficulty. Sitting in chair.  HEENT: normal Neck: supple. JVP to ear Carotids 2+ bilat; no bruits. No lymphadenopathy or thryomegaly appreciated. Cor: PMI nondisplaced. Regular rate & rhythm. 2/6 MR Lungs: clear Abdomen: soft, nontender, + distended. No hepatosplenomegaly. No bruits or masses. Good bowel sounds. Extremities: no cyanosis, clubbing, rash, R and LLE 2+ edema RUE PICC  Neuro: alert & orientedx3, cranial nerves grossly intact. moves all 4 extremities w/o difficulty. Affect pleasant    Rhythm: SR 60s   Scheduled Meds: . aspirin EC  81 mg Oral Daily  . furosemide  80 mg Intravenous TID AC  . hydrALAZINE  100 mg Oral BID  . insulin aspart  0-9 Units Subcutaneous TID WC  . isosorbide mononitrate  60 mg Oral BID  . pantoprazole  40 mg Oral Q0600  . sodium bicarbonate  650 mg Oral BID  . sodium chloride  3 mL Intravenous Q12H   Continuous Infusions: . milrinone 0.25 mcg/kg/min (06/28/13 0909)   PRN Meds:.sodium chloride, acetaminophen, ALPRAZolam, loperamide,  nitroGLYCERIN, ondansetron (ZOFRAN) IV, sodium chloride, sodium chloride, zolpidem Lab Results:  Recent Labs  06/27/13 0540 06/28/13 0705  WBC 4.0 4.3  HGB 8.7* 8.5*  PLT 81* 70*    Recent Labs  06/27/13 0540 06/28/13 0705  NA 134* 132*  K 4.2 4.1  CL 97 94*  CO2 25 25  GLUCOSE 87 111*  BUN 50* 56*  CREATININE 1.88* 2.13*   No results found for this basename: TROPONINI, CK, MB,  in the last 72 hours No results found for this basename: INR,  in the last 72 hours  Imaging: Imaging results have been reviewed  Cardiac Studies:  Assessment/Plan:  63 yo [name] with poorly controlled DM, HTN, CRI, ICM- EF 30-35% Jan 2015, Severe 2V CAD at cath Jan 2015 (medical Rx), and Pulmonary HTN admitted 06/25/13 with wgt gain, BNP 27K.  1) Acute on chronic systolic HF 2) Acute on chronic renal failure stage IV in setting of cardiorenal syndrome 3) CAD 4) DM2, poorly controlled 5) Thrombocytopenia (previously 100-120k; now 70-80k). Liver imaging normal 2/15 6) Protein calorie malnutrition. Albumin 2.3  PLAN:  Volume status remains elevated. Sluggish diuresis with addition of Milrinone. Continue IV lasix. CO-OX pending. Weight unchanged . Creatinine trending up. May need RHC after HF opimized. Continue hydralazine 100 mg tid.No ace due to CKD.  No BB due to suspected low output.   Continue PPI twice a day per GI  Move to SDU to monitor CVPs and co-ox  Amy D Clegg NP-C  10:06 AM  Patient seen and examined with Darrick Grinder, NP. We discussed all aspects of the encounter. I agree with the assessment and plan as stated above.   She remains volume overloaded with worsening renal failure. Poor response to IV lasix and milrinone. Will move to SDU for follow CVPs and co-ox. Suspect we may need to increase milrinone and lasix.   Shaune Pascal Heather Mckendree,MD 12:52 PM

## 2013-06-28 NOTE — Progress Notes (Signed)
Transferred to 2H-20, message left for son of new location and spoke with daughter and informed of same, Hazle Nordmann RN

## 2013-06-28 NOTE — Progress Notes (Signed)
    Progress Note   Subjective  Feels okay this am. No further vomiting. Eating small amounts.    Objective   Vital signs in last 24 hours: Temp:  [97.4 F (36.3 C)-98.2 F (36.8 C)] 98.2 F (36.8 C) (05/22 0501) Pulse Rate:  [50-68] 68 (05/22 0501) Resp:  [16-18] 16 (05/22 0501) BP: (114-125)/(60-64) 114/60 mmHg (05/22 0501) SpO2:  [93 %-96 %] 93 % (05/22 0501) Weight:  [130 lb 12.8 oz (59.33 kg)] 130 lb 12.8 oz (59.33 kg) (05/22 0501) Last BM Date: 06/25/13 General:    Pleasant asian female in NAD Heart:  Regular rate and rhythm Lungs: Respirations even and unlabored, lungs CTA bilaterally Abdomen:  Soft, nontender and nondistended. Normal bowel sounds. Extremities:  Without edema. Neurologic:  Alert and oriented,  grossly normal neurologically. Psych:  Cooperative. Normal mood and affect.  Lab Results:  Recent Labs  06/26/13 0632 06/27/13 0540 06/28/13 0705  WBC 4.2 4.0 4.3  HGB 8.9* 8.7* 8.5*  HCT 27.1* 26.6* 25.6*  PLT 71* 81* 70*   BMET  Recent Labs  06/26/13 0632 06/27/13 0540 06/28/13 0705  NA 136* 134* 132*  K 3.9 4.2 4.1  CL 100 97 94*  CO2 25 25 25   GLUCOSE 54* 87 111*  BUN 47* 50* 56*  CREATININE 1.57* 1.88* 2.13*  CALCIUM 8.4 8.3* 8.6   LFT  Recent Labs  06/26/13 0632  PROT 7.2  ALBUMIN 2.3*  AST 21  ALT 9  ALKPHOS 135*  BILITOT 0.8  BILIDIR 0.4*  IBILI 0.4      Assessment / Plan:   37. 63 year old asian female (limited English) with episode of blood streaked night before last (was anticoagulated for DVT prophylaxis).  Hgb stable overnight though has continued to drift slowly over few days.  Patient had normal EGD in February 2015 so doubt significant upper GI pathology . Continue BID PPI, will change to PO.   2. Normocytic anemia. Baseline hgb around 11-12. At time of this admission hgb was 10, down to 8.5now.  She also has acute on chronic thrombocytopenia (doubt HIT as she was on Lovenox, not Heparin). Patient has CKD,  creatinine higher than baseline so wonder if renal failure is contributing to progressive anemia.    3. History of ascites Feb 2015. This may have been cardiac related, she has anasarca this admission, BNP elevated. No evidence for chronic liver disease. Of note viral hepatitis studies in Feb were negative.     LOS: 3 days   Willia Craze  06/28/2013, 8:51 AM     Attending physician's note   I have taken an interval history, reviewed the chart and examined the patient. I agree with the Advanced Practitioner's note, impression and recommendations. No emesis or hematemesis. Continue PPI bid during this hospitalization and then PPI qam at discharge. Thrombocytopenia and anemia may need further evaluation. Consider Hematology consult.   Pricilla Riffle. Fuller Plan, MD [facility]

## 2013-06-29 LAB — CBC WITH DIFFERENTIAL/PLATELET
Basophils Absolute: 0 10*3/uL (ref 0.0–0.1)
Basophils Relative: 1 % (ref 0–1)
Eosinophils Absolute: 0.9 10*3/uL — ABNORMAL HIGH (ref 0.0–0.7)
Eosinophils Relative: 20 % — ABNORMAL HIGH (ref 0–5)
HCT: 23.5 % — ABNORMAL LOW (ref 36.0–46.0)
HEMOGLOBIN: 8.2 g/dL — AB (ref 12.0–15.0)
Lymphocytes Relative: 11 % — ABNORMAL LOW (ref 12–46)
Lymphs Abs: 0.5 10*3/uL — ABNORMAL LOW (ref 0.7–4.0)
MCH: 31.2 pg (ref 26.0–34.0)
MCHC: 34.9 g/dL (ref 30.0–36.0)
MCV: 89.4 fL (ref 78.0–100.0)
MONOS PCT: 10 % (ref 3–12)
Monocytes Absolute: 0.4 10*3/uL (ref 0.1–1.0)
Neutro Abs: 2.6 10*3/uL (ref 1.7–7.7)
Neutrophils Relative %: 59 % (ref 43–77)
Platelets: 72 10*3/uL — ABNORMAL LOW (ref 150–400)
RBC: 2.63 MIL/uL — ABNORMAL LOW (ref 3.87–5.11)
RDW: 17.2 % — ABNORMAL HIGH (ref 11.5–15.5)
WBC: 4.4 10*3/uL (ref 4.0–10.5)

## 2013-06-29 LAB — CARBOXYHEMOGLOBIN
Carboxyhemoglobin: 1.8 % — ABNORMAL HIGH (ref 0.5–1.5)
METHEMOGLOBIN: 0.7 % (ref 0.0–1.5)
O2 SAT: 74.6 %
TOTAL HEMOGLOBIN: 8.2 g/dL — AB (ref 12.0–16.0)

## 2013-06-29 LAB — BASIC METABOLIC PANEL
BUN: 60 mg/dL — ABNORMAL HIGH (ref 6–23)
CALCIUM: 8.2 mg/dL — AB (ref 8.4–10.5)
CO2: 26 mEq/L (ref 19–32)
Chloride: 94 mEq/L — ABNORMAL LOW (ref 96–112)
Creatinine, Ser: 2.33 mg/dL — ABNORMAL HIGH (ref 0.50–1.10)
GFR calc Af Amer: 24 mL/min — ABNORMAL LOW (ref >90)
GFR, EST NON AFRICAN AMERICAN: 21 mL/min — AB (ref >90)
GLUCOSE: 183 mg/dL — AB (ref 70–99)
Potassium: 3.8 mEq/L (ref 3.7–5.3)
SODIUM: 132 meq/L — AB (ref 137–147)

## 2013-06-29 LAB — GLUCOSE, CAPILLARY
Glucose-Capillary: 120 mg/dL — ABNORMAL HIGH (ref 70–99)
Glucose-Capillary: 181 mg/dL — ABNORMAL HIGH (ref 70–99)
Glucose-Capillary: 182 mg/dL — ABNORMAL HIGH (ref 70–99)
Glucose-Capillary: 130 mg/dL — ABNORMAL HIGH (ref 70–99)

## 2013-06-29 LAB — URINE CULTURE: Colony Count: >=100000

## 2013-06-29 NOTE — Progress Notes (Signed)
  ADVANCED HF ROUNDING NOTE  Subjective:   Remains on milrinone. Still with sluggish diuresis. No BMET or co-ox today. Co-ox from yesterday 62%  Denies dyspnea/CP. Ab swelling a bit better. CVP 15  Creatinine 1.5> 1.8>2.1 > pending  Objective:  Vital Signs in the last 24 hours: Temp:  [97.7 F (36.5 C)-98.4 F (36.9 C)] 97.7 F (36.5 C) (05/23 1110) Pulse Rate:  [65-96] 70 (05/23 1110) Resp:  [3-27] 13 (05/23 1110) BP: (103-142)/(41-77) 117/65 mmHg (05/23 1110) SpO2:  [94 %-98 %] 98 % (05/23 1110) Weight:  [61.2 kg (134 lb 14.7 oz)-61.4 kg (135 lb 5.8 oz)] 61.2 kg (134 lb 14.7 oz) (05/23 0300)  Intake/Output from previous day:  Intake/Output Summary (Last 24 hours) at 06/29/13 1209 Last data filed at 06/29/13 1000  Gross per 24 hour  Intake 709.34 ml  Output    500 ml  Net 209.34 ml    Physical Exam: General:  Elderly. Chronically ill appearing. No resp difficulty. Sitting in chair.  HEENT: normal Neck: supple. JVP to ear Carotids 2+ bilat; no bruits. No lymphadenopathy or thryomegaly appreciated. Cor: PMI nondisplaced. Regular rate & rhythm. 2/6 MR/TR +s3 Lungs: clear Abdomen: soft, nontender, + mildly distended. No hepatosplenomegaly. No bruits or masses. Good bowel sounds. Extremities: no cyanosis, clubbing, rash, R and LLE 1+ edema RUE PICC  Neuro: alert & orientedx3, cranial nerves grossly intact. moves all 4 extremities w/o difficulty. Affect pleasant    Rhythm: SR 60s   Scheduled Meds: . aspirin EC  81 mg Oral Daily  . furosemide  80 mg Intravenous TID AC  . hydrALAZINE  100 mg Oral BID  . insulin aspart  0-9 Units Subcutaneous TID WC  . isosorbide mononitrate  60 mg Oral BID  . metolazone  5 mg Oral BID  . pantoprazole  40 mg Oral Q0600  . sodium bicarbonate  650 mg Oral BID  . sodium chloride  3 mL Intravenous Q12H   Continuous Infusions: . milrinone 0.25 mcg/kg/min (06/29/13 0646)   PRN Meds:.sodium chloride, acetaminophen, ALPRAZolam, loperamide,  nitroGLYCERIN, ondansetron (ZOFRAN) IV, sodium chloride, sodium chloride, zolpidem Lab Results:  Recent Labs  06/28/13 0705 06/29/13 0640  WBC 4.3 4.4  HGB 8.5* 8.2*  PLT 70* 72*    Recent Labs  06/27/13 0540 06/28/13 0705  NA 134* 132*  K 4.2 4.1  CL 97 94*  CO2 25 25  GLUCOSE 87 111*  BUN 50* 56*  CREATININE 1.88* 2.13*   No results found for this basename: TROPONINI, CK, MB,  in the last 72 hours No results found for this basename: INR,  in the last 72 hours  Imaging: Imaging results have been reviewed  Cardiac Studies:  Assessment/Plan:  63 yo [name] with poorly controlled DM, HTN, CRI, ICM- EF 30-35% Jan 2015, Severe 2V CAD at cath Jan 2015 (medical Rx), and Pulmonary HTN admitted 06/25/13 with wgt gain, BNP 27K.  1) Acute on chronic systolic HF 2) Acute on chronic renal failure stage IV in setting of cardiorenal syndrome 3) CAD 4) DM2, poorly controlled 5) Thrombocytopenia (previously 100-120k; now 70-80k). Liver imaging normal 2/15 6) Protein calorie malnutrition. Albumin 2.3  PLAN:   Overall a little bit better. Ab distension improving. Co-ox looks good. CVP still up. Will continue current regimen. Will check BMET and co-ox now. PT to see. Keep in SDU.   Shaune Pascal Bensimhon,MD 12:09 PM

## 2013-06-30 LAB — CBC WITH DIFFERENTIAL/PLATELET
BASOS ABS: 0 10*3/uL (ref 0.0–0.1)
Basophils Relative: 1 % (ref 0–1)
EOS ABS: 1 10*3/uL — AB (ref 0.0–0.7)
Eosinophils Relative: 24 % — ABNORMAL HIGH (ref 0–5)
HCT: 24.7 % — ABNORMAL LOW (ref 36.0–46.0)
Hemoglobin: 8.2 g/dL — ABNORMAL LOW (ref 12.0–15.0)
Lymphocytes Relative: 15 % (ref 12–46)
Lymphs Abs: 0.6 10*3/uL — ABNORMAL LOW (ref 0.7–4.0)
MCH: 30.4 pg (ref 26.0–34.0)
MCHC: 33.2 g/dL (ref 30.0–36.0)
MCV: 91.5 fL (ref 78.0–100.0)
Monocytes Absolute: 0.3 10*3/uL (ref 0.1–1.0)
Monocytes Relative: 8 % (ref 3–12)
NEUTROS PCT: 53 % (ref 43–77)
Neutro Abs: 2.2 10*3/uL (ref 1.7–7.7)
PLATELETS: 78 10*3/uL — AB (ref 150–400)
RBC: 2.7 MIL/uL — ABNORMAL LOW (ref 3.87–5.11)
RDW: 17.2 % — ABNORMAL HIGH (ref 11.5–15.5)
WBC: 4.3 10*3/uL (ref 4.0–10.5)

## 2013-06-30 LAB — BASIC METABOLIC PANEL
BUN: 66 mg/dL — AB (ref 6–23)
CALCIUM: 8.3 mg/dL — AB (ref 8.4–10.5)
CO2: 26 mEq/L (ref 19–32)
Chloride: 90 mEq/L — ABNORMAL LOW (ref 96–112)
Creatinine, Ser: 2.61 mg/dL — ABNORMAL HIGH (ref 0.50–1.10)
GFR, EST AFRICAN AMERICAN: 21 mL/min — AB (ref >90)
GFR, EST NON AFRICAN AMERICAN: 18 mL/min — AB (ref >90)
Glucose, Bld: 137 mg/dL — ABNORMAL HIGH (ref 70–99)
Potassium: 3.9 mEq/L (ref 3.7–5.3)
Sodium: 127 mEq/L — ABNORMAL LOW (ref 137–147)

## 2013-06-30 LAB — CARBOXYHEMOGLOBIN
CARBOXYHEMOGLOBIN: 1.7 % — AB (ref 0.5–1.5)
METHEMOGLOBIN: 0.8 % (ref 0.0–1.5)
O2 SAT: 67 %
Total hemoglobin: 9 g/dL — ABNORMAL LOW (ref 12.0–16.0)

## 2013-06-30 LAB — GLUCOSE, CAPILLARY
GLUCOSE-CAPILLARY: 206 mg/dL — AB (ref 70–99)
GLUCOSE-CAPILLARY: 122 mg/dL — AB (ref 70–99)
GLUCOSE-CAPILLARY: 123 mg/dL — AB (ref 70–99)
Glucose-Capillary: 124 mg/dL — ABNORMAL HIGH (ref 70–99)

## 2013-06-30 MED ORDER — INSULIN ASPART 100 UNIT/ML ~~LOC~~ SOLN
0.0000 [IU] | Freq: Three times a day (TID) | SUBCUTANEOUS | Status: DC
  Administered 2013-06-30: 3 [IU] via SUBCUTANEOUS
  Administered 2013-06-30 – 2013-07-01 (×3): 1 [IU] via SUBCUTANEOUS
  Administered 2013-07-02 (×2): 2 [IU] via SUBCUTANEOUS
  Administered 2013-07-02 – 2013-07-03 (×2): 1 [IU] via SUBCUTANEOUS
  Administered 2013-07-03: 2 [IU] via SUBCUTANEOUS
  Administered 2013-07-04 (×2): 1 [IU] via SUBCUTANEOUS
  Administered 2013-07-04: 2 [IU] via SUBCUTANEOUS
  Administered 2013-07-05: 1 [IU] via SUBCUTANEOUS
  Administered 2013-07-05: 2 [IU] via SUBCUTANEOUS
  Administered 2013-07-05 – 2013-07-06 (×2): 1 [IU] via SUBCUTANEOUS
  Administered 2013-07-06 (×2): 2 [IU] via SUBCUTANEOUS
  Administered 2013-07-07: 1 [IU] via SUBCUTANEOUS
  Administered 2013-07-07: 3 [IU] via SUBCUTANEOUS
  Administered 2013-07-07 – 2013-07-09 (×5): 1 [IU] via SUBCUTANEOUS
  Administered 2013-07-09 – 2013-07-10 (×2): 2 [IU] via SUBCUTANEOUS

## 2013-06-30 NOTE — Progress Notes (Addendum)
  ADVANCED HF ROUNDING NOTE  Subjective:   Remains on milrinone. Diuresis has picked up some but creatinine continues to get worse, Co-ox 72%  Denies dyspnea or orthopnea. Ab swelling a bit better. CVP 20  Creatinine 1.5> 1.8>2.1 > 2.6  Objective:  Vital Signs in the last 24 hours: Temp:  [97.7 F (36.5 C)-98.6 F (37 C)] 98.4 F (36.9 C) (05/24 0400) Pulse Rate:  [69-76] 74 (05/24 0700) Resp:  [9-23] 15 (05/24 0700) BP: (105-152)/(46-70) 132/70 mmHg (05/24 0700) SpO2:  [95 %-99 %] 97 % (05/24 0700) Weight:  [61.3 kg (135 lb 2.3 oz)] 61.3 kg (135 lb 2.3 oz) (05/24 0634)  Intake/Output from previous day:  Intake/Output Summary (Last 24 hours) at 06/30/13 0815 Last data filed at 06/30/13 0636  Gross per 24 hour  Intake    426 ml  Output   1800 ml  Net  -1374 ml    Physical Exam: General:  Elderly. Chronically ill appearing. No resp difficulty. Lying in bed HEENT: normal Neck: supple. JVP to ear Carotids 2+ bilat; no bruits. No lymphadenopathy or thryomegaly appreciated. Cor: PMI nondisplaced. Regular rate & rhythm. 2/6 MR/TR +s3 Lungs: clear Abdomen: soft, nontender, + mildly distended. No hepatosplenomegaly. No bruits or masses. Good bowel sounds. Extremities: no cyanosis, clubbing, rash, R and LLE 1+ edema RUE PICC  Neuro: alert & orientedx3, cranial nerves grossly intact. moves all 4 extremities w/o difficulty. Affect pleasant    Rhythm: SR 60s   Scheduled Meds: . aspirin EC  81 mg Oral Daily  . hydrALAZINE  100 mg Oral BID  . insulin aspart  0-9 Units Subcutaneous TID WC  . isosorbide mononitrate  60 mg Oral BID  . pantoprazole  40 mg Oral Q0600  . sodium chloride  3 mL Intravenous Q12H   Continuous Infusions: . milrinone 0.25 mcg/kg/min (06/30/13 0636)   PRN Meds:.sodium chloride, acetaminophen, ALPRAZolam, loperamide, nitroGLYCERIN, ondansetron (ZOFRAN) IV, sodium chloride, sodium chloride, zolpidem Lab Results:  Recent Labs  06/29/13 0640  06/30/13 0650  WBC 4.4 4.3  HGB 8.2* 8.2*  PLT 72* 78*    Recent Labs  06/29/13 1230 06/30/13 0650  NA 132* 127*  K 3.8 3.9  CL 94* 90*  CO2 26 26  GLUCOSE 183* 137*  BUN 60* 66*  CREATININE 2.33* 2.61*   No results found for this basename: TROPONINI, CK, MB,  in the last 72 hours No results found for this basename: INR,  in the last 72 hours  Imaging: Imaging results have been reviewed  Cardiac Studies:  Assessment/Plan:  63 yo [name] with poorly controlled DM, HTN, CRI, ICM- EF 30-35% Jan 2015, Severe 2V CAD at cath Jan 2015 (medical Rx), and Pulmonary HTN admitted 06/25/13 with wgt gain, BNP 27K.  1) Acute on chronic systolic HF 2) Acute on chronic renal failure stage IV in setting of cardiorenal syndrome 3) CAD 4) DM2, poorly controlled 5) Thrombocytopenia (previously 100-120k; now 70-80k). Liver imaging normal 2/15 6) Protein calorie malnutrition. Albumin 2.3  PLAN:   Weight and CVP remains up. Co-ox has normalized on milrinone. Renal function continues to worsen. Will hold IV lasix. Try po torsemide. Will likely need RHC in am.    Shaune Pascal Malley Hauter,MD 8:15 AM

## 2013-06-30 NOTE — Progress Notes (Signed)
Physical Therapy Evaluation Patient Details Name: [name] [name] MRN: [id number] DOB: [birth year] Today's Date: 06/30/2013   History of Present Illness  Patient is a 63 yo female admitted 06/25/13 with increased weight gain and DOE.  Patient with CHF, acute on chronic renal failure, CAD, uncontrolled DM.   Patient with h/o DM, HTN, CRI, ICM with EF of 30-35%, CHF  Clinical Impression  Patient presents with problems listed below.  Will benefit from acute PT to maximize independence prior to discharge home with family.  May need assistive device for balance - will continue to assess.    Follow Up Recommendations No PT follow up;Supervision/Assistance - 24 hour    Equipment Recommendations  None recommended by PT    Recommendations for Other Services       Precautions / Restrictions Precautions Precautions: Fall Restrictions Weight Bearing Restrictions: No      Mobility  Bed Mobility Overal bed mobility: Needs Assistance Bed Mobility: Supine to Sit;Sit to Supine     Supine to sit: Supervision Sit to supine: Supervision   General bed mobility comments: No cues needed.  Patient using bed rail to move to sitting.  Transfers Overall transfer level: Needs assistance Equipment used: None Transfers: Sit to/from Stand Sit to Stand: Min guard         General transfer comment: Assist for balance/safety.  Unsteady in stance.  Ambulation/Gait Ambulation/Gait assistance: Min guard Ambulation Distance (Feet): 70 Feet Assistive device: None Gait Pattern/deviations: Step-through pattern;Decreased stride length;Shuffle Gait velocity: Slow Gait velocity interpretation: Below normal speed for age/gender General Gait Details: Patient somewhat unsteady during gait.  Able to self-correct.  Provided close min guard for safety/balance.  Stairs            Wheelchair Mobility    Modified Rankin (Stroke Patients Only)       Balance                                              Pertinent Vitals/Pain     Home Living Family/patient expects to be discharged to:: Private residence Living Arrangements: Children Available Help at Discharge: Family;Available 24 hours/day (Daughter home 24 hours; Son works) Type of Home: [facility] Access: Stairs to enter Entrance Stairs-Rails: None Technical brewer of Steps: 3 Home Layout: One level Home Equipment: None      Prior Function Level of Independence: Independent               Hand Dominance        Extremity/Trunk Assessment   Upper Extremity Assessment: Overall WFL for tasks assessed;RUE deficits/detail RUE Deficits / Details: Noted significant edema RUE from upper arm to hand.  Hand edematous.  RN notified.         Lower Extremity Assessment: Generalized weakness         Communication   Communication: Prefers language other than English ([name])  Cognition Arousal/Alertness: Awake/alert Behavior During Therapy: WFL for tasks assessed/performed Overall Cognitive Status: Within Functional Limits for tasks assessed                      General Comments      Exercises        Assessment/Plan    PT Assessment Patient needs continued PT services  PT Diagnosis Abnormality of gait;Generalized weakness   PT Problem List Decreased strength;Decreased activity tolerance;Decreased balance;Decreased mobility;Decreased knowledge of use  of DME;Cardiopulmonary status limiting activity  PT Treatment Interventions DME instruction;Gait training;Stair training;Functional mobility training;Balance training;Patient/family education   PT Goals (Current goals can be found in the Care Plan section) Acute Rehab PT Goals Patient Stated Goal: None stated PT Goal Formulation: With patient Time For Goal Achievement: 07/07/13 Potential to Achieve Goals: Good    Frequency Min 3X/week   Barriers to discharge        Co-evaluation               End of Session Equipment  Utilized During Treatment: Gait belt Activity Tolerance: Patient limited by fatigue Patient left: in bed;with call bell/phone within reach Nurse Communication: Mobility status (Edema RUE)         Time: 6378-5885 PT Time Calculation (min): 13 min   Charges:   PT Evaluation $Initial PT Evaluation Tier I: 1 Procedure PT Treatments $Gait Training: 8-22 mins   PT G Codes:          Despina Pole 2013/07/29, 4:51 PM Carita Pian. Sanjuana Kava, [place] Pager [phone]

## 2013-07-01 DIAGNOSIS — N189 Chronic kidney disease, unspecified: Secondary | ICD-10-CM

## 2013-07-01 DIAGNOSIS — N179 Acute kidney failure, unspecified: Secondary | ICD-10-CM

## 2013-07-01 LAB — CBC WITH DIFFERENTIAL/PLATELET
Basophils Absolute: 0.1 10*3/uL (ref 0.0–0.1)
Basophils Relative: 1 % (ref 0–1)
Eosinophils Absolute: 1.2 10*3/uL — ABNORMAL HIGH (ref 0.0–0.7)
Eosinophils Relative: 25 % — ABNORMAL HIGH (ref 0–5)
HEMATOCRIT: 25 % — AB (ref 36.0–46.0)
Hemoglobin: 8.6 g/dL — ABNORMAL LOW (ref 12.0–15.0)
LYMPHS ABS: 0.8 10*3/uL (ref 0.7–4.0)
LYMPHS PCT: 15 % (ref 12–46)
MCH: 30.6 pg (ref 26.0–34.0)
MCHC: 34.4 g/dL (ref 30.0–36.0)
MCV: 89 fL (ref 78.0–100.0)
Monocytes Absolute: 0.3 10*3/uL (ref 0.1–1.0)
Monocytes Relative: 6 % (ref 3–12)
Neutro Abs: 2.6 10*3/uL (ref 1.7–7.7)
Neutrophils Relative %: 52 % (ref 43–77)
PLATELETS: 84 10*3/uL — AB (ref 150–400)
RBC: 2.81 MIL/uL — ABNORMAL LOW (ref 3.87–5.11)
RDW: 17 % — ABNORMAL HIGH (ref 11.5–15.5)
WBC: 5 10*3/uL (ref 4.0–10.5)

## 2013-07-01 LAB — CARBOXYHEMOGLOBIN
Carboxyhemoglobin: 1.7 % — ABNORMAL HIGH (ref 0.5–1.5)
METHEMOGLOBIN: 0.8 % (ref 0.0–1.5)
O2 Saturation: 69.7 %
TOTAL HEMOGLOBIN: 7 g/dL — AB (ref 12.0–16.0)

## 2013-07-01 LAB — GLUCOSE, CAPILLARY
GLUCOSE-CAPILLARY: 160 mg/dL — AB (ref 70–99)
Glucose-Capillary: 131 mg/dL — ABNORMAL HIGH (ref 70–99)
Glucose-Capillary: 93 mg/dL (ref 70–99)
Glucose-Capillary: 148 mg/dL — ABNORMAL HIGH (ref 70–99)

## 2013-07-01 LAB — BASIC METABOLIC PANEL
BUN: 64 mg/dL — ABNORMAL HIGH (ref 6–23)
CALCIUM: 8.4 mg/dL (ref 8.4–10.5)
CO2: 28 meq/L (ref 19–32)
Chloride: 90 mEq/L — ABNORMAL LOW (ref 96–112)
Creatinine, Ser: 2.38 mg/dL — ABNORMAL HIGH (ref 0.50–1.10)
GFR calc Af Amer: 24 mL/min — ABNORMAL LOW (ref >90)
GFR, EST NON AFRICAN AMERICAN: 21 mL/min — AB (ref >90)
Glucose, Bld: 151 mg/dL — ABNORMAL HIGH (ref 70–99)
POTASSIUM: 3.9 meq/L (ref 3.7–5.3)
Sodium: 129 mEq/L — ABNORMAL LOW (ref 137–147)

## 2013-07-01 NOTE — Progress Notes (Addendum)
ADVANCED HF ROUNDING NOTE  Subjective:   Remains on milrinone. Lasix held due to worsening renal function.  Still with good diuresis though. Cr slightly better today. Weight down 5 pounds. Co-ox 70%  CVP remains high ~15 (but improving)  Denies dyspnea or orthopnea.   Creatinine 1.5> 1.8>2.1 > 2.6>2.4  Objective:  Vital Signs in the last 24 hours: Temp:  [97.3 F (36.3 C)-97.8 F (36.6 C)] 97.7 F (36.5 C) (05/25 0802) Pulse Rate:  [65-76] 72 (05/25 0900) Resp:  [9-23] 9 (05/25 0900) BP: (107-140)/(45-77) 129/66 mmHg (05/25 0900) SpO2:  [93 %-99 %] 96 % (05/25 0900) Weight:  [59.1 kg (130 lb 4.7 oz)] 59.1 kg (130 lb 4.7 oz) (05/25 0500)  Intake/Output from previous day:  Intake/Output Summary (Last 24 hours) at 07/01/13 1022 Last data filed at 07/01/13 1000  Gross per 24 hour  Intake  96.36 ml  Output   2250 ml  Net -2153.64 ml    Physical Exam: General:  Elderly. Chronically ill appearing. No resp difficulty. Lying in bed HEENT: normal Neck: supple. JVP to ear Carotids 2+ bilat; no bruits. No lymphadenopathy or thryomegaly appreciated. Cor: PMI nondisplaced. Regular rate & rhythm. 2/6 MR/TR +s3 Lungs: clear Abdomen: soft, nontender, + mildly distended. No hepatosplenomegaly. No bruits or masses. Good bowel sounds. Extremities: no cyanosis, clubbing, rash, R and LLE 1+ edema RUE PICC  Neuro: alert & orientedx3, cranial nerves grossly intact. moves all 4 extremities w/o difficulty. Affect pleasant    Rhythm: SR 60s   Scheduled Meds: . aspirin EC  81 mg Oral Daily  . hydrALAZINE  100 mg Oral BID  . insulin aspart  0-9 Units Subcutaneous TID WC & HS  . isosorbide mononitrate  60 mg Oral BID  . pantoprazole  40 mg Oral Q0600  . sodium chloride  3 mL Intravenous Q12H   Continuous Infusions: . milrinone 0.25 mcg/kg/min (07/01/13 0654)   PRN Meds:.sodium chloride, acetaminophen, ALPRAZolam, loperamide, nitroGLYCERIN, ondansetron (ZOFRAN) IV, sodium chloride, sodium  chloride, zolpidem Lab Results:  Recent Labs  06/30/13 0650 07/01/13 0630  WBC 4.3 5.0  HGB 8.2* 8.6*  PLT 78* 84*    Recent Labs  06/30/13 0650 07/01/13 0630  NA 127* 129*  K 3.9 3.9  CL 90* 90*  CO2 26 28  GLUCOSE 137* 151*  BUN 66* 64*  CREATININE 2.61* 2.38*   No results found for this basename: TROPONINI, CK, MB,  in the last 72 hours No results found for this basename: INR,  in the last 72 hours  Imaging: Imaging results have been reviewed  Cardiac Studies:  Assessment/Plan:  63 yo [name] with poorly controlled DM, HTN, CRI, ICM- EF 30-35% Jan 2015, Severe 2V CAD at cath Jan 2015 (medical Rx), and Pulmonary HTN admitted 06/25/13 with wgt gain, BNP 27K.  1) Acute on chronic systolic HF 2) Acute on chronic renal failure stage IV in setting of cardiorenal syndrome 3) CAD 4) DM2, poorly controlled 5) Thrombocytopenia (previously 100-120k; now 70-80k). Liver imaging normal 2/15 6) Protein calorie malnutrition. Albumin 2.3  PLAN:   Once again seems to be improving off diuretics (she did the same thing last admission). Will continue milrinone. Can start po demadex if u/o slows down. If hits a plateau will need RHC. I am not sure I understand what is going on with her physiology completely. ? ATN which is now resolving  Place TED hose. No bblocker or ACE yet due to low output and renal failure. Continue hydralazine/Imdur  Shaune Pascal Br[place],MD 10:22 AM

## 2013-07-02 DIAGNOSIS — M7989 Other specified soft tissue disorders: Secondary | ICD-10-CM

## 2013-07-02 DIAGNOSIS — I059 Rheumatic mitral valve disease, unspecified: Secondary | ICD-10-CM

## 2013-07-02 LAB — BASIC METABOLIC PANEL
BUN: 64 mg/dL — ABNORMAL HIGH (ref 6–23)
CHLORIDE: 93 meq/L — AB (ref 96–112)
CO2: 27 meq/L (ref 19–32)
Calcium: 8.6 mg/dL (ref 8.4–10.5)
Creatinine, Ser: 2.07 mg/dL — ABNORMAL HIGH (ref 0.50–1.10)
GFR calc Af Amer: 28 mL/min — ABNORMAL LOW (ref >90)
GFR calc non Af Amer: 24 mL/min — ABNORMAL LOW (ref >90)
Glucose, Bld: 123 mg/dL — ABNORMAL HIGH (ref 70–99)
Potassium: 4 mEq/L (ref 3.7–5.3)
SODIUM: 131 meq/L — AB (ref 137–147)

## 2013-07-02 LAB — GLUCOSE, CAPILLARY
GLUCOSE-CAPILLARY: 121 mg/dL — AB (ref 70–99)
GLUCOSE-CAPILLARY: 199 mg/dL — AB (ref 70–99)
Glucose-Capillary: 181 mg/dL — ABNORMAL HIGH (ref 70–99)
Glucose-Capillary: 136 mg/dL — ABNORMAL HIGH (ref 70–99)

## 2013-07-02 LAB — CARBOXYHEMOGLOBIN
Carboxyhemoglobin: 2.1 % — ABNORMAL HIGH (ref 0.5–1.5)
Methemoglobin: 0.7 % (ref 0.0–1.5)
O2 Saturation: 83.6 %
Total hemoglobin: 8.7 g/dL — ABNORMAL LOW (ref 12.0–16.0)

## 2013-07-02 MED ORDER — TORSEMIDE 20 MG PO TABS
40.0000 mg | ORAL_TABLET | Freq: Every day | ORAL | Status: DC
  Administered 2013-07-02 – 2013-07-05 (×4): 40 mg via ORAL
  Filled 2013-07-02 (×6): qty 2

## 2013-07-02 MED ORDER — ENOXAPARIN SODIUM 30 MG/0.3ML ~~LOC~~ SOLN
30.0000 mg | SUBCUTANEOUS | Status: DC
  Administered 2013-07-02 – 2013-07-09 (×8): 30 mg via SUBCUTANEOUS
  Filled 2013-07-02 (×9): qty 0.3

## 2013-07-02 NOTE — Progress Notes (Signed)
ADVANCED HF ROUNDING NOTE  Subjective:   Remains on milrinone. Remains off diuretics. Denies dyspnea, CP or orthopnea. RUE swollen 2+. CVP 15  We reweighed personally and weight 129 pounds.( down 1 pound overnight - 6 pounds total)  Co-ox 84% Creatinine 1.5> 1.8>2.1 > 2.6>2.4> 2.1  Objective:  Vital Signs in the last 24 hours: Temp:  [97.3 F (36.3 C)-97.7 F (36.5 C)] 97.3 F (36.3 C) (05/26 0000) Pulse Rate:  [71-78] 76 (05/26 0600) Resp:  [9-22] 13 (05/26 0600) BP: (104-145)/(48-83) 131/63 mmHg (05/26 0600) SpO2:  [93 %-98 %] 95 % (05/26 0600) Weight:  [133 lb 1.6 oz (60.374 kg)] 133 lb 1.6 oz (60.374 kg) (05/26 0651)  Intake/Output from previous day:  Intake/Output Summary (Last 24 hours) at 07/02/13 0655 Last data filed at 07/01/13 1000  Gross per 24 hour  Intake      0 ml  Output   1050 ml  Net  -1050 ml    Physical Exam: General:  Elderly. Chronically ill appearing. No resp difficulty. Lying in bed HEENT: normal Neck: supple. JVP jaw Carotids 2+ bilat; no bruits. No lymphadenopathy or thryomegaly appreciated. Cor: PMI nondisplaced. Regular rate & rhythm. 2/6 MR/TR +s3 Lungs: clear Abdomen: soft, nontender, mildly distended. No hepatosplenomegaly. No bruits or masses. Good bowel sounds. Extremities: no cyanosis, clubbing, rash, no edema; RUE 2+ edema Neuro: alert & orientedx3, cranial nerves grossly intact. moves all 4 extremities w/o difficulty. Affect pleasant    Rhythm: SR 70s   Scheduled Meds: . aspirin EC  81 mg Oral Daily  . hydrALAZINE  100 mg Oral BID  . insulin aspart  0-9 Units Subcutaneous TID WC & HS  . isosorbide mononitrate  60 mg Oral BID  . pantoprazole  40 mg Oral Q0600  . sodium chloride  3 mL Intravenous Q12H   Continuous Infusions: . milrinone 0.25 mcg/kg/min (07/02/13 0332)   PRN Meds:.sodium chloride, acetaminophen, ALPRAZolam, loperamide, nitroGLYCERIN, ondansetron (ZOFRAN) IV, sodium chloride, sodium chloride, zolpidem Lab  Results:  Recent Labs  06/30/13 0650 07/01/13 0630  WBC 4.3 5.0  HGB 8.2* 8.6*  PLT 78* 84*    Recent Labs  06/30/13 0650 07/01/13 0630  NA 127* 129*  K 3.9 3.9  CL 90* 90*  CO2 26 28  GLUCOSE 137* 151*  BUN 66* 64*  CREATININE 2.61* 2.38*   No results found for this basename: TROPONINI, CK, MB,  in the last 72 hours No results found for this basename: INR,  in the last 72 hours  Imaging: Imaging results have been reviewed  Cardiac Studies:  Assessment/Plan:  63 yo [name] with poorly controlled DM, HTN, CRI, ICM- EF 30-35% Jan 2015, Severe 2V CAD at cath Jan 2015 (medical Rx), and Pulmonary HTN admitted 06/25/13 with wgt gain, BNP 27K.  1) Acute on chronic systolic HF 2) Acute on chronic renal failure stage IV in setting of cardiorenal syndrome 3) CAD 4) DM2, poorly controlled 5) Thrombocytopenia (previously 100-120k; now 70-80k). Liver imaging normal 2/15 6) Protein calorie malnutrition. Albumin 2.3  PLAN:   Volume status remains elevated and she denies any SOB or orthopnea. CVP 15  will start demadex 40 daily  Cr stable. Co-ox 83% will wean milrinone to 0.125.  RUE swollen, will get RUE doppler to assess for clot.   No bblocker or ACE yet due to low output and renal failure. Continue hydralazine/Imdur   Rande Brunt, NP-C 6:55 AM  Patient seen and examined with Junie Bame, NP. We discussed all aspects  of the encounter. I agree with the assessment and plan as stated above.   Overall seems to be doing better. Still volume overloaded. Co-ox high. Will cut milrinone down to 0.125. Start demadex 40 daily. Low threshold to RHC if not continuing to improve. Agree with RUE u/s.   Shaune Pascal Bensimhon,MD 8:46 AM

## 2013-07-02 NOTE — Progress Notes (Signed)
Physical Therapy Treatment Patient Details Name: [name] [name] MRN: [id number] DOB: 07-31-1950 Today's Date: 07/02/2013    History of Present Illness Patient is a 63 yo female admitted 06/25/13 with increased weight gain and DOE.  Patient with CHF, acute on chronic renal failure, CAD, uncontrolled DM.   Patient with h/o DM, HTN, CRI, ICM with EF of 30-35%, CHF    PT Comments    Pt progressing towards physical therapy goals. Appears to be improving with overall balance during mobility, with only occasional unsteadiness noted during gait training. Good rehab effort with therapeutic exercise. Will continue to progress per POC.   Follow Up Recommendations  No PT follow up;Supervision for mobility/OOB     Equipment Recommendations  None recommended by PT    Recommendations for Other Services       Precautions / Restrictions Precautions Precautions: Fall Precaution Comments: Mod fall risk Restrictions Weight Bearing Restrictions: No    Mobility  Bed Mobility Overal bed mobility: Needs Assistance Bed Mobility: Supine to Sit     Supine to sit: Supervision     General bed mobility comments: No cues needed.  Patient using bed rail to move to sitting.  Transfers Overall transfer level: Needs assistance Equipment used: None Transfers: Sit to/from Stand Sit to Stand: Supervision         General transfer comment: Pt able to power-up to full stand without assist or noted unsteadiness. Pt steady with static standing at EOB (30 seconds) while therapist adjusted gown.  Ambulation/Gait Ambulation/Gait assistance: Min guard Ambulation Distance (Feet): 200 Feet Assistive device: None Gait Pattern/deviations: Step-through pattern;Decreased stride length Gait velocity: Slow Gait velocity interpretation: Below normal speed for age/gender General Gait Details: Occasional unsteadiness noted throughout gait training. No physical assist required to recover, however close guard required. Pt  took 2 standing rest breaks due to fatigue, but was motivated for distance.    Stairs            Wheelchair Mobility    Modified Rankin (Stroke Patients Only)       Balance Overall balance assessment: Needs assistance Sitting-balance support: Feet supported;No upper extremity supported Sitting balance-Leahy Scale: Good     Standing balance support: No upper extremity supported Standing balance-Leahy Scale: Fair                      Cognition Arousal/Alertness: Awake/alert Behavior During Therapy: WFL for tasks assessed/performed Overall Cognitive Status: Within Functional Limits for tasks assessed                      Exercises General Exercises - Lower Extremity Ankle Circles/Pumps: 15 reps Quad Sets:  (Attempted, couldn't coordinate quads for isometrics) Heel Slides: 15 reps Hip ABduction/ADduction: 15 reps Straight Leg Raises: 15 reps    General Comments        Pertinent Vitals/Pain Vitals stable throughout session. Pt on RA.    Home Living                      Prior Function            PT Goals (current goals can now be found in the care plan section) Acute Rehab PT Goals Patient Stated Goal: None stated PT Goal Formulation: With patient Time For Goal Achievement: 07/07/13 Potential to Achieve Goals: Good Progress towards PT goals: Progressing toward goals    Frequency  Min 3X/week    PT Plan Current plan remains appropriate  Co-evaluation             End of Session Equipment Utilized During Treatment: Gait belt Activity Tolerance: Patient tolerated treatment well Patient left: in chair;with call bell/phone within reach     Time: 0918-0945 PT Time Calculation (min): 27 min  Charges:  $Gait Training: 8-22 mins $Therapeutic Exercise: 8-22 mins                    G Codes:      Jolyn Lent 07-06-2013, 9:57 AM  Jolyn Lent, PT, DPT Acute Rehabilitation Services Pager: [phone]

## 2013-07-02 NOTE — Progress Notes (Signed)
CARDIAC REHAB PHASE I   PRE:  Rate/Rhythm: 72 SR  BP:  Supine: 115/59  Sitting:   Standing:    SaO2: 96%RA  MODE:  Ambulation: 270 ft   POST:  Rate/Rhythm: 77-81 SR  BP:  Supine: 138/64  Sitting:   Standing:    SaO2: 93%RA 1415-1450 Assisted pt to bathroom before walk. Walked 270 ft on RA with rolling walker and asst x 1. Did not stop to rest but tired at end. To bed after walk. Slightly SOB but sats good.   Graylon Good, RN BSN  07/02/2013 2:47 PM

## 2013-07-02 NOTE — Progress Notes (Signed)
Pt's RUE is swollen and red with large purple ecchymosis starting from PICC line site extending down to elbow;Cardiology MD notified and IV team called; IV team assigned and confirmed that PICC was in proper location; IV team suggested heat and instructed pt to change positions often; Milrinone continues to run through PICC line; RN with continue to monitor site for further changes.

## 2013-07-02 NOTE — Progress Notes (Signed)
*  PRELIMINARY RESULTS* Vascular Ultrasound Right upper extremity venous duplex has been completed.  Preliminary findings: No evidence of DVT or superficial thrombosis. Cannot image veins under PICC bandages.   Landry Mellow, RDMS, RVT  07/02/2013, 10:12 AM

## 2013-07-03 LAB — BASIC METABOLIC PANEL
BUN: 67 mg/dL — AB (ref 6–23)
CHLORIDE: 92 meq/L — AB (ref 96–112)
CO2: 27 mEq/L (ref 19–32)
CREATININE: 2.25 mg/dL — AB (ref 0.50–1.10)
Calcium: 8.2 mg/dL — ABNORMAL LOW (ref 8.4–10.5)
GFR calc Af Amer: 26 mL/min — ABNORMAL LOW (ref >90)
GFR calc non Af Amer: 22 mL/min — ABNORMAL LOW (ref >90)
Glucose, Bld: 101 mg/dL — ABNORMAL HIGH (ref 70–99)
POTASSIUM: 3.9 meq/L (ref 3.7–5.3)
Sodium: 130 mEq/L — ABNORMAL LOW (ref 137–147)

## 2013-07-03 LAB — CARBOXYHEMOGLOBIN
Carboxyhemoglobin: 2 % — ABNORMAL HIGH (ref 0.5–1.5)
Methemoglobin: 0.8 % (ref 0.0–1.5)
O2 Saturation: 68.3 %
Total hemoglobin: 8.5 g/dL — ABNORMAL LOW (ref 12.0–16.0)

## 2013-07-03 LAB — PROTEIN ELECTROPH W RFLX QUANT IMMUNOGLOBULINS
ALBUMIN ELP: 39.4 % — AB (ref 55.8–66.1)
ALPHA-1-GLOBULIN: 5.9 % — AB (ref 2.9–4.9)
Alpha-2-Globulin: 6.8 % — ABNORMAL LOW (ref 7.1–11.8)
BETA GLOBULIN: 5.7 % (ref 4.7–7.2)
Beta 2: 8 % — ABNORMAL HIGH (ref 3.2–6.5)
GAMMA GLOBULIN: 34.2 % — AB (ref 11.1–18.8)
M-Spike, %: NOT DETECTED g/dL
Total Protein ELP: 7.1 g/dL (ref 6.0–8.3)

## 2013-07-03 LAB — IGG, IGA, IGM
IGM, SERUM: 177 mg/dL (ref 52–322)
IgA: 579 mg/dL — ABNORMAL HIGH (ref 69–380)
IgG (Immunoglobin G), Serum: 2990 mg/dL — ABNORMAL HIGH (ref 690–1700)

## 2013-07-03 LAB — GLUCOSE, CAPILLARY
GLUCOSE-CAPILLARY: 181 mg/dL — AB (ref 70–99)
Glucose-Capillary: 85 mg/dL (ref 70–99)
Glucose-Capillary: 121 mg/dL — ABNORMAL HIGH (ref 70–99)
Glucose-Capillary: 116 mg/dL — ABNORMAL HIGH (ref 70–99)

## 2013-07-03 LAB — IMMUNOFIXATION ADD-ON

## 2013-07-03 NOTE — Progress Notes (Signed)
CARDIAC REHAB PHASE I   PRE:  Rate/Rhythm: 74 SR  BP:  Supine: 123/47  Sitting:   Standing:    SaO2: 96%RA  MODE:  Ambulation: 350 ft   POST:  Rate/Rhythm: 74 SR  BP:  Supine:   Sitting: 113/54  Standing:    SaO2: 94%RA 1210-1230 Pt walked 350 ft on RA with rolling walker and minimal asst. Stopped twice to rest. Tired by end of walk but tolerated well. Requested to go back to bed.    Graylon Good, RN BSN  07/03/2013 12:27 PM

## 2013-07-03 NOTE — Progress Notes (Signed)
ADVANCED HF ROUNDING NOTE  Subjective:   [name] [name] decreased to 0.125 yesterday. [name] [name] doppler for swelling and no DVT or superficial thrombosis. Stopped IV lasix and transitioned to PO torsemide. Weight down 3 lbs, 24 hr I/O -55 cc (?). Ambulated ~ 200 ft with CR yesterday. Denies SOB, orthopnea or CP.    Co-ox 84%>68% Creatinine 1.5> 1.8>2.1 > 2.6>2.4> 2.1>2.25 CVP 12  Objective:  Vital Signs in the last 24 hours: Temp:  [97.6 F (36.4 C)-98.6 F (37 C)] 98 F (36.7 C) (05/27 0300) Pulse Rate:  [70-76] 76 (05/27 0300) Resp:  [13-23] 14 (05/27 0300) BP: (102-143)/(43-89) 126/57 mmHg (05/27 0300) SpO2:  [95 %-98 %] 95 % (05/27 0300) Weight:  [130 lb 15.3 oz (59.4 kg)-132 lb 1.6 oz (59.92 kg)] 130 lb 15.3 oz (59.4 kg) (05/27 0600)  Intake/Output from previous day:  Intake/Output Summary (Last 24 hours) at 07/03/13 0738 Last data filed at 07/03/13 0700  Gross per 24 hour  Intake 195.04 ml  Output    250 ml  Net -54.96 ml    Physical Exam: General:  [name] [name]. Chronically ill appearing. No resp difficulty. Lying in bed HEENT: normal Neck: supple. JVP jaw Carotids 2+ bilat; no bruits. No lymphadenopathy or thryomegaly appreciated. Cor: PMI nondisplaced. Regular rate & rhythm. 2/6 MR/TR +s3 Lungs: clear Abdomen: soft, nontender, mildly distended. No hepatosplenomegaly. No bruits or masses. Good bowel sounds. Extremities: no cyanosis, clubbing, rash, no edema; no edema Neuro: alert & orientedx3, cranial nerves grossly intact. moves all 4 extremities w/o difficulty. Affect pleasant    Rhythm: SR 70s   Scheduled Meds: . aspirin EC  81 mg Oral Daily  . enoxaparin (LOVENOX) injection  30 mg Subcutaneous Q24H  . hydrALAZINE  100 mg Oral BID  . insulin aspart  0-9 Units Subcutaneous TID WC & HS  . isosorbide mononitrate  60 mg Oral BID  . pantoprazole  40 mg Oral Q0600  . sodium chloride  3 mL Intravenous Q12H  . torsemide  40 mg Oral Daily   Continuous Infusions: .  [name] [name] 0.125 mcg/kg/min (07/03/13 0143)   PRN Meds:.sodium chloride, acetaminophen, ALPRAZolam, loperamide, nitroGLYCERIN, ondansetron (ZOFRAN) IV, sodium chloride, sodium chloride, zolpidem Lab Results:  Recent Labs  07/01/13 0630  WBC 5.0  HGB 8.6*  PLT 84*    Recent Labs  07/02/13 0645 07/03/13 0500  NA 131* 130*  K 4.0 3.9  CL 93* 92*  CO2 27 27  GLUCOSE 123* 101*  BUN 64* 67*  CREATININE 2.07* 2.25*   No results found for this basename: TROPONINI, CK, MB,  in the last 72 hours No results found for this basename: INR,  in the last 72 hours  Imaging: Imaging results have been reviewed  Cardiac Studies:  Assessment/Plan:  63 yo [name] with poorly controlled DM, HTN, CRI, ICM- EF 30-35% Jan 2015, Severe 2V CAD at cath Jan 2015 (medical Rx), and Pulmonary HTN admitted 06/25/13 with wgt gain, BNP 27K.  1) Acute on chronic systolic HF 2) Acute on chronic renal failure stage IV in setting of cardiorenal syndrome 3) CAD 4) DM2, poorly controlled 5) Thrombocytopenia (previously 100-120k; now 70-80k). Liver imaging normal 2/15 6) Protein calorie malnutrition. Albumin 2.3  PLAN:   Transitioned from IV lasix to torsemide yesterday. Weight is down but 24 hr I/O not accurate. Her CVP remains elevated 12 and her Cr is trending back up. Will need RHC to assess hemodynamics. Continue [name] [name] at 0.125 will not wean any further currently.   No B-blocker  or ACE yet due to low output and renal failure. Continue hydralazine/Imdur   [name] Brunt, NP-C 7:38 AM  Patient seen and examined with [name] Bame, NP. We discussed all aspects of the encounter. I agree with the assessment and plan as stated above.   Volume status slowly getting better. Co-ox ok. However Cr slightly worse. Will continue current regimen. May need RHC tomorrow if renal function continues to deteriorate.   Shaune Pascal Kanton Kamel,MD 5:10 PM

## 2013-07-04 ENCOUNTER — Ambulatory Visit: Payer: Self-pay | Admitting: Cardiovascular Disease

## 2013-07-04 LAB — BASIC METABOLIC PANEL
BUN: 69 mg/dL — AB (ref 6–23)
CHLORIDE: 92 meq/L — AB (ref 96–112)
CO2: 27 meq/L (ref 19–32)
CREATININE: 2.13 mg/dL — AB (ref 0.50–1.10)
Calcium: 8.7 mg/dL (ref 8.4–10.5)
GFR calc Af Amer: 27 mL/min — ABNORMAL LOW (ref >90)
GFR calc non Af Amer: 24 mL/min — ABNORMAL LOW (ref >90)
Glucose, Bld: 102 mg/dL — ABNORMAL HIGH (ref 70–99)
Potassium: 3.6 mEq/L — ABNORMAL LOW (ref 3.7–5.3)
Sodium: 131 mEq/L — ABNORMAL LOW (ref 137–147)

## 2013-07-04 LAB — GLUCOSE, CAPILLARY
GLUCOSE-CAPILLARY: 189 mg/dL — AB (ref 70–99)
Glucose-Capillary: 109 mg/dL — ABNORMAL HIGH (ref 70–99)
Glucose-Capillary: 143 mg/dL — ABNORMAL HIGH (ref 70–99)
Glucose-Capillary: 139 mg/dL — ABNORMAL HIGH (ref 70–99)

## 2013-07-04 LAB — CARBOXYHEMOGLOBIN
Carboxyhemoglobin: 2 % — ABNORMAL HIGH (ref 0.5–1.5)
Methemoglobin: 1.1 % (ref 0.0–1.5)
O2 Saturation: 67.1 %
Total hemoglobin: 8.1 g/dL — ABNORMAL LOW (ref 12.0–16.0)

## 2013-07-04 MED ORDER — POTASSIUM CHLORIDE CRYS ER 20 MEQ PO TBCR
40.0000 meq | EXTENDED_RELEASE_TABLET | Freq: Once | ORAL | Status: AC
  Administered 2013-07-04: 40 meq via ORAL
  Filled 2013-07-04: qty 2

## 2013-07-04 MED ORDER — ISOSORBIDE MONONITRATE ER 60 MG PO TB24
90.0000 mg | ORAL_TABLET | Freq: Every day | ORAL | Status: DC
  Administered 2013-07-04 – 2013-07-10 (×7): 90 mg via ORAL
  Filled 2013-07-04 (×9): qty 1

## 2013-07-04 MED ORDER — MILRINONE IN DEXTROSE 20 MG/100ML IV SOLN: 0.1250 ug/kg/min | INTRAVENOUS | Status: DC

## 2013-07-04 MED ORDER — HYDRALAZINE HCL 50 MG PO TABS
100.0000 mg | ORAL_TABLET | Freq: Three times a day (TID) | ORAL | Status: DC
  Administered 2013-07-04 – 2013-07-10 (×18): 100 mg via ORAL
  Filled 2013-07-04 (×22): qty 2

## 2013-07-04 MED ORDER — MILRINONE IN DEXTROSE 20 MG/100ML IV SOLN
0.1250 ug/kg/min | INTRAVENOUS | Status: DC
  Administered 2013-07-04 (×2): 0.125 ug/kg/min via INTRAVENOUS
  Filled 2013-07-04: qty 100

## 2013-07-04 NOTE — Progress Notes (Signed)
ADVANCED HF ROUNDING NOTE  Subjective:   Milrinone 0.125 and co-ox 67%. Weight down 2 lbs, 24 hr I/O +360 (?). Ambulated 350 ft with CR and needing to stop twice to rest. Denies SOB, orthopnea or CP.    Co-ox 84%>68%>67% Creatinine 1.5> 1.8>2.1 > 2.6>2.4> 2.1>2.25>2.13 CVP 11  Objective:  Vital Signs in the last 24 hours: Temp:  [97.1 F (36.2 C)-98.6 F (37 C)] 98.6 F (37 C) (05/28 0337) Pulse Rate:  [71-75] 75 (05/28 0337) Resp:  [14-18] 18 (05/28 0337) BP: (112-145)/(47-68) 126/53 mmHg (05/28 0337) SpO2:  [95 %-97 %] 96 % (05/28 0337) Weight:  [128 lb 12 oz (58.4 kg)] 128 lb 12 oz (58.4 kg) (05/28 0337)  Intake/Output from previous day:  Intake/Output Summary (Last 24 hours) at 07/04/13 0726 Last data filed at 07/04/13 0700  Gross per 24 hour  Intake  964.8 ml  Output    600 ml  Net  364.8 ml    Physical Exam: General:  Elderly. Chronically ill appearing. No resp difficulty. Sitting up in bed HEENT: normal Neck: supple. JVP 9-10 Carotids 2+ bilat; no bruits. No lymphadenopathy or thryomegaly appreciated. Cor: PMI nondisplaced. Regular rate & rhythm. 2/6 MR/TR +s3 Lungs: clear Abdomen: soft, nontender, mildly distended. No hepatosplenomegaly. No bruits or masses. Good bowel sounds. Extremities: no cyanosis, clubbing, rash, no edema; no edema Neuro: alert & orientedx3, cranial nerves grossly intact. moves all 4 extremities w/o difficulty. Affect pleasant    Rhythm: SR 70s   Scheduled Meds: . aspirin EC  81 mg Oral Daily  . enoxaparin (LOVENOX) injection  30 mg Subcutaneous Q24H  . hydrALAZINE  100 mg Oral BID  . insulin aspart  0-9 Units Subcutaneous TID WC & HS  . isosorbide mononitrate  60 mg Oral BID  . pantoprazole  40 mg Oral Q0600  . sodium chloride  3 mL Intravenous Q12H  . torsemide  40 mg Oral Daily   Continuous Infusions: . milrinone 0.125 mcg/kg/min (07/03/13 2000)   PRN Meds:.sodium chloride, acetaminophen, ALPRAZolam, loperamide,  nitroGLYCERIN, ondansetron (ZOFRAN) IV, sodium chloride, sodium chloride, zolpidem Lab Results: No results found for this basename: WBC, HGB, PLT,  in the last 72 hours  Recent Labs  07/03/13 0500 07/04/13 0415  NA 130* 131*  K 3.9 3.6*  CL 92* 92*  CO2 27 27  GLUCOSE 101* 102*  BUN 67* 69*  CREATININE 2.25* 2.13*   No results found for this basename: TROPONINI, CK, MB,  in the last 72 hours No results found for this basename: INR,  in the last 72 hours  Imaging: Imaging results have been reviewed  Cardiac Studies:  Assessment/Plan:  63 yo [name] with poorly controlled DM, HTN, CRI, ICM- EF 30-35% Jan 2015, Severe 2V CAD at cath Jan 2015 (medical Rx), and Pulmonary HTN admitted 06/25/13 with wgt gain, BNP 27K.  1) Acute on chronic systolic HF 2) Acute on chronic renal failure stage IV in setting of cardiorenal syndrome 3) CAD 4) DM2, poorly controlled 5) Thrombocytopenia (previously 100-120k; now 70-80k). Liver imaging normal 2/15 6) Protein calorie malnutrition. Albumin 2.3  PLAN:   Weight down 2 lbs last night however 24 hr I/O not accurate. Her CVP remains elevated 11 but volume status seems to be improving. Will continue torsemide and may consider a dose of IV lasix today. Cr stable. Supplement K+.   Co-ox 67% will continue milrinone at 0.125. If her Cr starts increasing will need RHC before discharge.   No B-blocker or ACE yet  due to low output and renal failure. Will increase hydralazine to 100 mg TID and increase Imdur 90 mg daily.   Continue to ambulate with CR.    Rande Brunt, NP-C 7:26 AM  Patient seen and examined with Junie Bame, NP. We discussed all aspects of the encounter. I agree with the assessment and plan as stated above.   Feeling much better. Creatinine and CVP improving slowly. Continue milrinone and demadex. No ACE or bblock yet. If gets worse would have low threshold for RHC.   Shaune Pascal Rondal Vandevelde,MD 12:36 PM

## 2013-07-04 NOTE — Progress Notes (Signed)
CARDIAC REHAB PHASE I   PRE:  Rate/Rhythm: 72 SR    BP: sitting 126/58    SaO2:   MODE:  Ambulation: 350 ft   POST:  Rate/Rhythm: 74 SR    BP: sitting 123/59     SaO2:   Pt with slow and steady walk with RW. x1 rest stop. C/o fatigue, did not want to increase distance today. Return to bed. [id number]   Mayes, ACSM 07/04/2013 12:03 PM

## 2013-07-05 LAB — BASIC METABOLIC PANEL
BUN: 67 mg/dL — ABNORMAL HIGH (ref 6–23)
CO2: 26 mEq/L (ref 19–32)
CREATININE: 2.08 mg/dL — AB (ref 0.50–1.10)
Calcium: 8.6 mg/dL (ref 8.4–10.5)
Chloride: 92 mEq/L — ABNORMAL LOW (ref 96–112)
GFR calc non Af Amer: 24 mL/min — ABNORMAL LOW (ref >90)
GFR, EST AFRICAN AMERICAN: 28 mL/min — AB (ref >90)
Glucose, Bld: 100 mg/dL — ABNORMAL HIGH (ref 70–99)
Potassium: 3.7 mEq/L (ref 3.7–5.3)
Sodium: 130 mEq/L — ABNORMAL LOW (ref 137–147)

## 2013-07-05 LAB — GLUCOSE, CAPILLARY
GLUCOSE-CAPILLARY: 130 mg/dL — AB (ref 70–99)
Glucose-Capillary: 104 mg/dL — ABNORMAL HIGH (ref 70–99)
Glucose-Capillary: 153 mg/dL — ABNORMAL HIGH (ref 70–99)
Glucose-Capillary: 140 mg/dL — ABNORMAL HIGH (ref 70–99)

## 2013-07-05 LAB — CARBOXYHEMOGLOBIN
CARBOXYHEMOGLOBIN: 1.8 % — AB (ref 0.5–1.5)
METHEMOGLOBIN: 0.9 % (ref 0.0–1.5)
O2 Saturation: 68.1 %
TOTAL HEMOGLOBIN: 9.3 g/dL — AB (ref 12.0–16.0)

## 2013-07-05 MED ORDER — CARVEDILOL 3.125 MG PO TABS
3.1250 mg | ORAL_TABLET | Freq: Two times a day (BID) | ORAL | Status: DC
  Administered 2013-07-05 – 2013-07-10 (×11): 3.125 mg via ORAL
  Filled 2013-07-05 (×13): qty 1

## 2013-07-05 MED ORDER — POTASSIUM CHLORIDE CRYS ER 20 MEQ PO TBCR
20.0000 meq | EXTENDED_RELEASE_TABLET | Freq: Every day | ORAL | Status: DC
  Administered 2013-07-05 – 2013-07-09 (×5): 20 meq via ORAL
  Filled 2013-07-05 (×6): qty 1

## 2013-07-05 NOTE — Progress Notes (Signed)
Physical Therapy Treatment and Discharge Patient Details Name: [name] [name] MRN: [id number] DOB: [birth year] Today's Date: 07/05/2013    History of Present Illness Patient is a 63 yo female admitted 06/25/13 with increased weight gain and DOE.  Patient with CHF, acute on chronic renal failure, CAD, uncontrolled DM.   Patient with h/o DM, HTN, CRI, ICM with EF of 30-35%, CHF    PT Comments    Pt mobilizing well, although slowly. No losses of balance with transfers, gait, or balance testing (no device used). No further acute PT indicated and will sign off.   Follow Up Recommendations  No PT follow up;Supervision for mobility/OOB     Equipment Recommendations  None recommended by PT    Recommendations for Other Services       Precautions / Restrictions Precautions Precautions: Fall Restrictions Weight Bearing Restrictions: No    Mobility  Bed Mobility Overal bed mobility: Modified Independent Bed Mobility: Supine to Sit     Supine to sit: Modified independent (Device/Increase time);HOB elevated     General bed mobility comments: no rail, no cues  Transfers Overall transfer level: Modified independent Equipment used: None Transfers: Sit to/from Stand Sit to Stand: Modified independent (Device/Increase time)         General transfer comment: no unsteadiness or dizzinesss x 2  Ambulation/Gait Ambulation/Gait assistance: Min guard;Supervision Ambulation Distance (Feet): 350 Feet Assistive device: None Gait Pattern/deviations: Step-through pattern;Decreased stride length (feet turned out) Gait velocity: Slow Gait velocity interpretation: Below normal speed for age/gender General Gait Details: Pt unable to either understand vc to incr speed, or unable to incr speed. Did not follow instructions for DGI (due to language barrier). 2 standing rest breaks (pt stated legs were tired; no dyspnea). No listing off path or loss of balance   Stairs            Wheelchair  Mobility    Modified Rankin (Stroke Patients Only)       Balance     Sitting balance-Leahy Scale: Normal       Standing balance-Leahy Scale: Good       Tandem Stance - Right Leg: 30 Tandem Stance - Left Leg: 30 Rhomberg - Eyes Opened: 30 Rhomberg - Eyes Closed: 30 High level balance activites: Side stepping;Backward walking;Direction changes;Turns;Sudden stops High Level Balance Comments: no loss of balance throughout    Cognition Arousal/Alertness: Awake/alert Behavior During Therapy: WFL for tasks assessed/performed Overall Cognitive Status: Within Functional Limits for tasks assessed                      Exercises      General Comments        Pertinent Vitals/Pain HR 70's during activity No dyspnea with activity    Home Living                      Prior Function            PT Goals (current goals can now be found in the care plan section) Acute Rehab PT Goals Patient Stated Goal: agreed wants to walk without a device    Frequency       PT Plan Current plan remains appropriate    Co-evaluation             End of Session Equipment Utilized During Treatment: Gait belt Activity Tolerance: Patient tolerated treatment well Patient left: in chair;with call bell/phone within reach     Time: 1600-1617 PT Time Calculation (min):  17 min  Charges:  $Gait Training: 8-22 mins                    G Codes:      Rexanne Mano 2013-07-23, 4:27 PM Pager [phone]

## 2013-07-05 NOTE — Progress Notes (Signed)
ADVANCED HF ROUNDING NOTE  Subjective:   Remains on Milrinone 0.125 and co-ox 68%. Weight down 3 lbs, 24 hr I/O -500 (?). Ambulated 350 ft with CR. Denies SOB, orthopnea or CP.    Co-ox 84%>68%>67%>68% Creatinine 1.5> 1.8>2.1 > 2.6>2.4> 2.1>2.25>2.13>2.08 CVP 9  Objective:  Vital Signs in the last 24 hours: Temp:  [97.4 F (36.3 C)-98 F (36.7 C)] 97.4 F (36.3 C) (05/29 0314) Pulse Rate:  [74] 74 (05/28 0815) Resp:  [18-22] 22 (05/29 0400) BP: (123-160)/(59-79) 154/69 mmHg (05/29 0635) SpO2:  [95 %-99 %] 96 % (05/29 0400) Weight:  [125 lb 10.6 oz (57 kg)] 125 lb 10.6 oz (57 kg) (05/29 0314)  Intake/Output from previous day:  Intake/Output Summary (Last 24 hours) at 07/05/13 0739 Last data filed at 07/05/13 0700  Gross per 24 hour  Intake 601.25 ml  Output   1100 ml  Net -498.75 ml    Physical Exam: General:  Elderly. Chronically ill appearing. No resp difficulty. Sitting up in bed HEENT: normal Neck: supple. JVP 8-9 Carotids 2+ bilat; no bruits. No lymphadenopathy or thryomegaly appreciated. Cor: PMI nondisplaced. Regular rate & rhythm. 2/6 MR/TR +s3 Lungs: clear Abdomen: soft, nontender, mildly distended. No hepatosplenomegaly. No bruits or masses. Good bowel sounds. Extremities: no cyanosis, clubbing, rash, no edema; no edema Neuro: alert & orientedx3, cranial nerves grossly intact. moves all 4 extremities w/o difficulty. Affect pleasant    Rhythm: SR 70s   Scheduled Meds: . aspirin EC  81 mg Oral Daily  . enoxaparin (LOVENOX) injection  30 mg Subcutaneous Q24H  . hydrALAZINE  100 mg Oral 3 times per day  . insulin aspart  0-9 Units Subcutaneous TID WC & HS  . isosorbide mononitrate  90 mg Oral Daily  . pantoprazole  40 mg Oral Q0600  . sodium chloride  3 mL Intravenous Q12H  . torsemide  40 mg Oral Daily   Continuous Infusions: . milrinone 0.125 mcg/kg/min (07/04/13 2038)   PRN Meds:.sodium chloride, acetaminophen, ALPRAZolam, loperamide,  nitroGLYCERIN, ondansetron (ZOFRAN) IV, sodium chloride, sodium chloride, zolpidem Lab Results: No results found for this basename: WBC, HGB, PLT,  in the last 72 hours  Recent Labs  07/04/13 0415 07/05/13 0430  NA 131* 130*  K 3.6* 3.7  CL 92* 92*  CO2 27 26  GLUCOSE 102* 100*  BUN 69* 67*  CREATININE 2.13* 2.08*   No results found for this basename: TROPONINI, CK, MB,  in the last 72 hours No results found for this basename: INR,  in the last 72 hours  Imaging: Imaging results have been reviewed  Cardiac Studies:  Assessment/Plan:  63 yo [name] with poorly controlled DM, HTN, CRI, ICM- EF 30-35% Jan 2015, Severe 2V CAD at cath Jan 2015 (medical Rx), and Pulmonary HTN admitted 06/25/13 with wgt gain, BNP 27K.  1) Acute on chronic systolic HF 2) Acute on chronic renal failure stage IV in setting of cardiorenal syndrome 3) CAD 4) DM2, poorly controlled 5) Thrombocytopenia (previously 100-120k; now 70-80k). Liver imaging normal 2/15 6) Protein calorie malnutrition. Albumin 2.3  PLAN:   Weight down 3 lbs and CVP 9. Will continue PO torsemide. Remains on milrinone 0.125 will continue one more day and then turn off. Co-ox 68%. Cr trending down will continue to follow.  SBP 130-150s will start low dose BB coreg 3.125 mg BID. Continue hydrazine 100 mg TID and Imdur 90 mg daily. No ACE-I with CKD.    Continue to ambulate with CR.   Hopefully home  this weekend with follow up in HF clinic next week. Will place appt on chart.   Rande Brunt, NP-C 7:39 AM  Patient seen and examined with Junie Bame, NP. We discussed all aspects of the encounter. I agree with the assessment and plan as stated above.   She continues to improve slowly. Agree with changes as above. Would continue torsemide and milrinone. Can d/c milrinone Saturday or Sunday. Would likely hod onto her until Monday. Keep in SDU for monitoring of CVPs and co-ox.   Shaune Pascal Sanora Cunanan,MD 2:34 PM

## 2013-07-05 NOTE — Progress Notes (Signed)
CARDIAC REHAB PHASE I   PRE:  Rate/Rhythm: 69 SR  BP:  Supine: 114/55  Sitting:   Standing:    SaO2: 95%RA  MODE:  Ambulation: 350 ft   POST:  Rate/Rhythm: 76 SR  BP:  Supine:   Sitting: 125/54  Standing:    SaO2: 96%RA 1405-1432 Pt walked 350 ft on RA with rolling walker and asst x1 with steady gait. Stopped twice to rest. Could not get pt to go farther as she is tired at this distance. She did agree to go to recliner. Call bell in reach.   Graylon Good, RN BSN  07/05/2013 2:28 PM

## 2013-07-06 LAB — GLUCOSE, CAPILLARY
GLUCOSE-CAPILLARY: 146 mg/dL — AB (ref 70–99)
GLUCOSE-CAPILLARY: 186 mg/dL — AB (ref 70–99)
Glucose-Capillary: 114 mg/dL — ABNORMAL HIGH (ref 70–99)
Glucose-Capillary: 151 mg/dL — ABNORMAL HIGH (ref 70–99)

## 2013-07-06 LAB — CARBOXYHEMOGLOBIN
Carboxyhemoglobin: 2 % — ABNORMAL HIGH (ref 0.5–1.5)
METHEMOGLOBIN: 0.9 % (ref 0.0–1.5)
O2 SAT: 60.6 %
TOTAL HEMOGLOBIN: 8.6 g/dL — AB (ref 12.0–16.0)

## 2013-07-06 LAB — BASIC METABOLIC PANEL
BUN: 63 mg/dL — ABNORMAL HIGH (ref 6–23)
CALCIUM: 8.7 mg/dL (ref 8.4–10.5)
CO2: 26 mEq/L (ref 19–32)
CREATININE: 2.03 mg/dL — AB (ref 0.50–1.10)
Chloride: 94 mEq/L — ABNORMAL LOW (ref 96–112)
GFR calc non Af Amer: 25 mL/min — ABNORMAL LOW (ref >90)
GFR, EST AFRICAN AMERICAN: 29 mL/min — AB (ref >90)
Glucose, Bld: 119 mg/dL — ABNORMAL HIGH (ref 70–99)
Potassium: 4.1 mEq/L (ref 3.7–5.3)
Sodium: 133 mEq/L — ABNORMAL LOW (ref 137–147)

## 2013-07-06 LAB — OCCULT BLOOD X 1 CARD TO LAB, STOOL: Fecal Occult Bld: NEGATIVE

## 2013-07-06 MED ORDER — ATORVASTATIN CALCIUM 20 MG PO TABS
20.0000 mg | ORAL_TABLET | Freq: Every day | ORAL | Status: DC
  Administered 2013-07-06 – 2013-07-09 (×4): 20 mg via ORAL
  Filled 2013-07-06 (×5): qty 1

## 2013-07-06 MED ORDER — MILRINONE IN DEXTROSE 20 MG/100ML IV SOLN
0.1250 ug/kg/min | INTRAVENOUS | Status: DC
  Administered 2013-07-06 (×2): 0.125 ug/kg/min via INTRAVENOUS
  Filled 2013-07-06: qty 100

## 2013-07-06 MED ORDER — FUROSEMIDE 10 MG/ML IJ SOLN
80.0000 mg | Freq: Two times a day (BID) | INTRAMUSCULAR | Status: DC
  Administered 2013-07-06 – 2013-07-07 (×3): 80 mg via INTRAVENOUS
  Filled 2013-07-06 (×5): qty 8

## 2013-07-06 NOTE — Progress Notes (Signed)
CARDIAC REHAB PHASE I   PRE:  Rate/Rhythm: 69 sinus rhythm  BP:  Supine:   Sitting: 116/55  Standing:    SaO2: 98% ra  MODE:  Ambulation: 350 ft   POST:  Rate/Rhythem: 66 sinus rhythm  BP:  Supine:   Sitting: 108/53  Standing:    SaO2: 94% ra  1520-1545 Pt ambulated in hallway x 1 assist using rolling walker.  Slow steady gait.  Asymptomatic, denies chest pain or dyspnea.  Pt returned to bed call light in reach.  [place]

## 2013-07-06 NOTE — Progress Notes (Addendum)
Patient ID: [name] [name], female   DOB: [birth year], 63 y.o.   MRN: [id number]  ADVANCED HF ROUNDING NOTE  Subjective:   Remains on Milrinone 0.125 and co-ox 61%. Weight down 1 lb, ambulated with CR. She had some dyspnea walking in the hall.  Nurse got CVP 15 this morning, I rechecked and got 13.   Co-ox 84%>68%>67%>68%>61%  Creatinine 1.5> 1.8>2.1 > 2.6>2.4> 2.1>2.25>2.13>2.08>2.0 CVP 13  Objective:  Vital Signs in the last 24 hours: Temp:  [97.9 F (36.6 C)-98.3 F (36.8 C)] 97.9 F (36.6 C) (05/30 0800) Pulse Rate:  [70-74] 74 (05/30 0411) Resp:  [16-18] 18 (05/30 0411) BP: (107-135)/(44-72) 125/59 mmHg (05/30 0800) SpO2:  [95 %-98 %] 97 % (05/30 0411) Weight:  [56.337 kg (124 lb 3.2 oz)] 56.337 kg (124 lb 3.2 oz) (05/30 0411)  Intake/Output from previous day:  Intake/Output Summary (Last 24 hours) at 07/06/13 0942 Last data filed at 07/06/13 0600  Gross per 24 hour  Intake  760.2 ml  Output   1400 ml  Net -639.8 ml    Physical Exam: General:  Elderly. Chronically ill appearing. No resp difficulty. Sitting up in bed HEENT: normal Neck: supple. JVP 10-12 Carotids 2+ bilat; no bruits. No lymphadenopathy or thryomegaly appreciated. Cor: PMI nondisplaced. Regular rate & rhythm. 2/6 MR/TR +s3 Lungs: clear Abdomen: soft, nontender, mildly distended. No hepatosplenomegaly. No bruits or masses. Good bowel sounds. Extremities: no cyanosis, clubbing, rash, no edema; no edema Neuro: alert & orientedx3, cranial nerves grossly intact. moves all 4 extremities w/o difficulty. Affect pleasant    Rhythm: SR 70s   Scheduled Meds: . aspirin EC  81 mg Oral Daily  . carvedilol  3.125 mg Oral BID WC  . enoxaparin (LOVENOX) injection  30 mg Subcutaneous Q24H  . furosemide  80 mg Intravenous BID  . hydrALAZINE  100 mg Oral 3 times per day  . insulin aspart  0-9 Units Subcutaneous TID WC & HS  . isosorbide mononitrate  90 mg Oral Daily  . pantoprazole  40 mg Oral Q0600  . potassium  chloride  20 mEq Oral Daily  . sodium chloride  3 mL Intravenous Q12H   Continuous Infusions: . milrinone 0.125 mcg/kg/min (07/06/13 0600)   PRN Meds:.sodium chloride, acetaminophen, ALPRAZolam, loperamide, nitroGLYCERIN, ondansetron (ZOFRAN) IV, sodium chloride, sodium chloride, zolpidem Lab Results: No results found for this basename: WBC, HGB, PLT,  in the last 72 hours  Recent Labs  07/05/13 0430 07/06/13 0552  NA 130* 133*  K 3.7 4.1  CL 92* 94*  CO2 26 26  GLUCOSE 100* 119*  BUN 67* 63*  CREATININE 2.08* 2.03*   No results found for this basename: TROPONINI, CK, MB,  in the last 72 hours No results found for this basename: INR,  in the last 72 hours  Imaging: Imaging results have been reviewed  Cardiac Studies:  Assessment/Plan:  63 yo [name] with poorly controlled DM, HTN, CRI, ICM- EF 30-35% Jan 2015, Severe 2V CAD at cath Jan 2015 (medical Rx), and Pulmonary HTN admitted 06/25/13 with wgt gain, BNP 27K.  1) Acute on chronic systolic HF 2) Acute on chronic renal failure stage IV in setting of cardiorenal syndrome 3) CAD 4) DM2, poorly controlled 5) Thrombocytopenia (previously 100-120k; now 70-80k). Liver imaging normal 2/15 6) Protein calorie malnutrition. Albumin 2.3  PLAN:   Weight down 1 lb and CVP 13 (nurse got 15).  As CVP remains fairly high, I am going to treat her with Lasix 80 mg IV bid  today.  Will continue current milrinone today. Creatinine has been stable.  Hopefully back to po diuretics tomorrow.   BP stable, on coreg 3.125 mg BID. Continue hydrazine 100 mg TID and Imdur 90 mg daily. No ACE-I with CKD.    Continue to ambulate with CR.   Add statin with CAD.   Hopefully home early next week.  Larey Dresser 07/06/2013 9:45 AM

## 2013-07-07 LAB — CLOSTRIDIUM DIFFICILE BY PCR: Toxigenic C. Difficile by PCR: NEGATIVE

## 2013-07-07 LAB — CARBOXYHEMOGLOBIN
CARBOXYHEMOGLOBIN: 1.8 % — AB (ref 0.5–1.5)
METHEMOGLOBIN: 1.1 % (ref 0.0–1.5)
O2 Saturation: 64.7 %
Total hemoglobin: 8.5 g/dL — ABNORMAL LOW (ref 12.0–16.0)

## 2013-07-07 LAB — GLUCOSE, CAPILLARY
GLUCOSE-CAPILLARY: 139 mg/dL — AB (ref 70–99)
Glucose-Capillary: 207 mg/dL — ABNORMAL HIGH (ref 70–99)
Glucose-Capillary: 99 mg/dL (ref 70–99)
Glucose-Capillary: 121 mg/dL — ABNORMAL HIGH (ref 70–99)

## 2013-07-07 LAB — BASIC METABOLIC PANEL
BUN: 65 mg/dL — ABNORMAL HIGH (ref 6–23)
CO2: 26 mEq/L (ref 19–32)
CREATININE: 2.07 mg/dL — AB (ref 0.50–1.10)
Calcium: 8.7 mg/dL (ref 8.4–10.5)
Chloride: 93 mEq/L — ABNORMAL LOW (ref 96–112)
GFR calc non Af Amer: 24 mL/min — ABNORMAL LOW (ref >90)
GFR, EST AFRICAN AMERICAN: 28 mL/min — AB (ref >90)
Glucose, Bld: 151 mg/dL — ABNORMAL HIGH (ref 70–99)
POTASSIUM: 4 meq/L (ref 3.7–5.3)
Sodium: 132 mEq/L — ABNORMAL LOW (ref 137–147)

## 2013-07-07 MED ORDER — MILRINONE IN DEXTROSE 20 MG/100ML IV SOLN
0.1250 ug/kg/min | INTRAVENOUS | Status: DC
  Administered 2013-07-08: 0.125 ug/kg/min via INTRAVENOUS
  Filled 2013-07-07: qty 100

## 2013-07-07 MED ORDER — FUROSEMIDE 10 MG/ML IJ SOLN
80.0000 mg | Freq: Three times a day (TID) | INTRAMUSCULAR | Status: DC
  Administered 2013-07-07 – 2013-07-10 (×9): 80 mg via INTRAVENOUS
  Filled 2013-07-07 (×10): qty 8

## 2013-07-07 NOTE — Progress Notes (Signed)
Contact precautions implemented for recurrent diarrhea.

## 2013-07-07 NOTE — Progress Notes (Signed)
Patient ID: [name] [name], female   DOB: [birth year], 63 y.o.   MRN: [id number]  ADVANCED HF ROUNDING NOTE  Subjective:   Remains on Milrinone 0.125 and co-ox 65%. Weight down 1 lb but I/Os not recorded (not sure why), ambulated with CR. She had some dyspnea walking in the hall.  CVP 14 this morning.  Co-ox 84%>68%>67%>68%>61%>65%  Creatinine 1.5> 1.8>2.1 > 2.6>2.4> 2.1>2.25>2.13>2.08>2.0>2.07  Objective:  Vital Signs in the last 24 hours: Temp:  [97.4 F (36.3 C)-98.1 F (36.7 C)] 98 F (36.7 C) (05/31 1128) Pulse Rate:  [68-76] 68 (05/31 1128) Resp:  [20] 20 (05/30 1618) BP: (91-139)/(53-69) 134/64 mmHg (05/31 1128) SpO2:  [95 %-99 %] 96 % (05/31 1128) Weight:  [56.201 kg (123 lb 14.4 oz)] 56.201 kg (123 lb 14.4 oz) (05/31 0500)  Intake/Output from previous day:  Intake/Output Summary (Last 24 hours) at 07/07/13 1156 Last data filed at 07/07/13 1100  Gross per 24 hour  Intake 412.98 ml  Output    500 ml  Net -87.02 ml    Physical Exam: General:  Elderly. Chronically ill appearing. No resp difficulty. Sitting up in bed HEENT: normal Neck: supple. JVP 12 Carotids 2+ bilat; no bruits. No lymphadenopathy or thryomegaly appreciated. Cor: PMI nondisplaced. Regular rate & rhythm. 2/6 MR/TR +s3 Lungs: clear Abdomen: soft, nontender, mildly distended. No hepatosplenomegaly. No bruits or masses. Good bowel sounds. Extremities: no cyanosis, clubbing, rash, no edema; no edema Neuro: alert & orientedx3, cranial nerves grossly intact. moves all 4 extremities w/o difficulty. Affect pleasant    Rhythm: SR 70s   Scheduled Meds: . aspirin EC  81 mg Oral Daily  . atorvastatin  20 mg Oral q1800  . carvedilol  3.125 mg Oral BID WC  . enoxaparin (LOVENOX) injection  30 mg Subcutaneous Q24H  . furosemide  80 mg Intravenous BID  . hydrALAZINE  100 mg Oral 3 times per day  . insulin aspart  0-9 Units Subcutaneous TID WC & HS  . isosorbide mononitrate  90 mg Oral Daily  . pantoprazole  40  mg Oral Q0600  . potassium chloride  20 mEq Oral Daily  . sodium chloride  3 mL Intravenous Q12H   Continuous Infusions: . milrinone 0.125 mcg/kg/min (07/07/13 0837)   PRN Meds:.sodium chloride, acetaminophen, ALPRAZolam, loperamide, nitroGLYCERIN, ondansetron (ZOFRAN) IV, sodium chloride, sodium chloride, zolpidem Lab Results: No results found for this basename: WBC, HGB, PLT,  in the last 72 hours  Recent Labs  07/06/13 0552 07/07/13 0500  NA 133* 132*  K 4.1 4.0  CL 94* 93*  CO2 26 26  GLUCOSE 119* 151*  BUN 63* 65*  CREATININE 2.03* 2.07*   No results found for this basename: TROPONINI, CK, MB,  in the last 72 hours No results found for this basename: INR,  in the last 72 hours  Imaging: Imaging results have been reviewed  Cardiac Studies:  Assessment/Plan:  63 yo [name] with poorly controlled DM, HTN, CRI, ICM- EF 30-35% Jan 2015, Severe 2V CAD at cath Jan 2015 (medical Rx), and Pulmonary HTN admitted 06/25/13 with wgt gain, BNP 27K.  1) Acute on chronic systolic HF 2) Acute on chronic renal failure stage IV in setting of cardiorenal syndrome 3) CAD 4) DM2, poorly controlled 5) Thrombocytopenia (previously 100-120k; now 70-80k). Liver imaging normal 2/15 6) Protein calorie malnutrition. Albumin 2.3  PLAN:   JVP and CVP still high.  I/Os not recorded but weight down 1 lb yesterday.  Increase Lasix to 80 mg IV every  8 hours, would like to see a bit more diuresis before discharge.  Will continue current milrinone today. Creatinine has been stable.  Hopefully back to po diuretics tomorrow.   BP stable, on coreg 3.125 mg BID. Continue hydrazine 100 mg TID and Imdur 90 mg daily. No ACE-I with CKD.    Continue to ambulate with CR.   Hopefully home early this week.   Larey Dresser 07/07/2013 11:56 AM

## 2013-07-08 LAB — GLUCOSE, CAPILLARY
GLUCOSE-CAPILLARY: 104 mg/dL — AB (ref 70–99)
GLUCOSE-CAPILLARY: 134 mg/dL — AB (ref 70–99)
Glucose-Capillary: 146 mg/dL — ABNORMAL HIGH (ref 70–99)
Glucose-Capillary: 113 mg/dL — ABNORMAL HIGH (ref 70–99)

## 2013-07-08 LAB — BASIC METABOLIC PANEL
BUN: 58 mg/dL — AB (ref 6–23)
CO2: 26 mEq/L (ref 19–32)
CREATININE: 1.88 mg/dL — AB (ref 0.50–1.10)
Calcium: 8.8 mg/dL (ref 8.4–10.5)
Chloride: 95 mEq/L — ABNORMAL LOW (ref 96–112)
GFR, EST AFRICAN AMERICAN: 32 mL/min — AB (ref >90)
GFR, EST NON AFRICAN AMERICAN: 27 mL/min — AB (ref >90)
Glucose, Bld: 104 mg/dL — ABNORMAL HIGH (ref 70–99)
POTASSIUM: 3.8 meq/L (ref 3.7–5.3)
Sodium: 134 mEq/L — ABNORMAL LOW (ref 137–147)

## 2013-07-08 LAB — CARBOXYHEMOGLOBIN
CARBOXYHEMOGLOBIN: 1.7 % — AB (ref 0.5–1.5)
METHEMOGLOBIN: 0.9 % (ref 0.0–1.5)
O2 SAT: 70.7 %
Total hemoglobin: 8.8 g/dL — ABNORMAL LOW (ref 12.0–16.0)

## 2013-07-08 NOTE — Progress Notes (Signed)
Patient ID: [name] [name], female   DOB: 04-02-1950, 63 y.o.   MRN: [id number]  ADVANCED HF ROUNDING NOTE  Subjective:   Remains on Milrinone 0.125 with co-ox 71%. Did well with IV lasix over weekend. Weight and creatinine coming down now 1.88 (baseline 1.5-1.8). Feels better. Walking halls. CVP [name]-12   Objective:  Vital Signs in the last 24 hours: Temp:  [97.7 F (36.5 C)-98 F (36.7 C)] 98 F (36.7 C) (06/01 0500) Pulse Rate:  [67-68] 67 (05/31 1716) BP: (118-134)/(48-64) 131/62 mmHg (06/01 0500) SpO2:  [96 %-98 %] 97 % (05/31 2345) Weight:  [54.9 kg (121 lb 0.5 oz)] 54.9 kg (121 lb 0.5 oz) (06/01 0500)  Intake/Output from previous day:  Intake/Output Summary (Last 24 hours) at 07/08/13 0744 Last data filed at 07/07/13 2000  Gross per 24 hour  Intake  499.2 ml  Output   1050 ml  Net -550.8 ml    Physical Exam: General:  Elderly. Chronically ill appearing. No resp difficulty. Sitting up in bed HEENT: normal Neck: supple. JVP 12 Carotids 2+ bilat; no bruits. No lymphadenopathy or thryomegaly appreciated. Cor: PMI nondisplaced. Regular rate & rhythm. 2/6 MR/TR +s3 Lungs: clear Abdomen: soft, nontender, nondistended. No hepatosplenomegaly. No bruits or masses. Good bowel sounds. Extremities: no cyanosis, clubbing, rash, no edema; no edema Neuro: alert & orientedx3, cranial nerves grossly intact. moves all 4 extremities w/o difficulty. Affect pleasant    Rhythm: SR 70s   Scheduled Meds: . aspirin EC  81 mg Oral Daily  . atorvastatin  20 mg Oral q1800  . carvedilol  3.125 mg Oral BID WC  . enoxaparin (LOVENOX) injection  30 mg Subcutaneous Q24H  . furosemide  80 mg Intravenous 3 times per day  . hydrALAZINE  100 mg Oral 3 times per day  . insulin aspart  0-9 Units Subcutaneous TID WC & HS  . isosorbide mononitrate  90 mg Oral Daily  . pantoprazole  40 mg Oral Q0600  . potassium chloride  20 mEq Oral Daily  . sodium chloride  3 mL Intravenous Q12H   Continuous  Infusions: . milrinone 0.125 mcg/kg/min (07/07/13 2100)   PRN Meds:.sodium chloride, acetaminophen, ALPRAZolam, loperamide, nitroGLYCERIN, ondansetron (ZOFRAN) IV, sodium chloride, sodium chloride, zolpidem Lab Results: No results found for this basename: WBC, HGB, PLT,  in the last 72 hours  Recent Labs  07/07/13 0500 07/08/13 0500  NA 132* 134*  K 4.0 3.8  CL 93* 95*  CO2 26 26  GLUCOSE 151* 104*  BUN 65* 58*  CREATININE 2.07* 1.88*   No results found for this basename: TROPONINI, CK, MB,  in the last 72 hours No results found for this basename: INR,  in the last 72 hours  Imaging: Imaging results have been reviewed  Cardiac Studies:  Assessment/Plan:  [name] [name] female with poorly controlled DM, HTN, CRI, ICM- EF 30-35% Jan 2015, Severe 2V CAD at cath Jan 2015 (medical Rx), and Pulmonary HTN admitted 06/25/13 with wgt gain, BNP 27K.  1) Acute on chronic systolic HF 2) Acute on chronic renal failure stage IV in setting of cardiorenal syndrome 3) CAD 4) DM2, poorly controlled 5) Thrombocytopenia (previously 100-120k; now 70-80k). Liver imaging normal 2/15 6) Protein calorie malnutrition. Albumin 2.3  PLAN:   Doing well on IV lasix. CVP still up a bit. Will continue IV lasix and milrinone today. Hoepfully switch to torsemide and stop milrinone tomorrow. Goal home by Wednesday. Continue ambulation.   BP stable, on coreg 3.125 mg BID. Continue  hydrazine 100 mg TID and Imdur 90 mg daily. No ACE-I with CKD.    Shaune Pascal Bensimhon MD 07/08/2013 7:44 AM

## 2013-07-08 NOTE — Progress Notes (Signed)
CARDIAC REHAB PHASE I   PRE:  Rate/Rhythm: 72 SR  BP:  Supine:  Sitting: 122/53  Standing:    SaO2: 97%RA  MODE:  Ambulation: 520 ft   POST:  Rate/Rhythm: 72 SR  BP:  Supine:   Sitting: 115/48  Standing:    SaO2: 95%RA 1000-1022 Pt walked 520 ft on RA with rolling walker and asst x 1 with steady gait. Stopped four times to rest. Did get pt to increase distance as she likes to stop at 350 ft. To recliner after walk. Call bell in reach. Has CHF booklet in room. Gets slightly SOB during walk but tolerated increase in distance well.   Graylon Good, RN BSN  07/08/2013 10:18 AM

## 2013-07-09 LAB — BASIC METABOLIC PANEL
BUN: 58 mg/dL — ABNORMAL HIGH (ref 6–23)
CHLORIDE: 94 meq/L — AB (ref 96–112)
CO2: 26 mEq/L (ref 19–32)
Calcium: 8.8 mg/dL (ref 8.4–10.5)
Creatinine, Ser: 1.96 mg/dL — ABNORMAL HIGH (ref 0.50–1.10)
GFR calc non Af Amer: 26 mL/min — ABNORMAL LOW (ref >90)
GFR, EST AFRICAN AMERICAN: 30 mL/min — AB (ref >90)
Glucose, Bld: 96 mg/dL (ref 70–99)
POTASSIUM: 4 meq/L (ref 3.7–5.3)
Sodium: 133 mEq/L — ABNORMAL LOW (ref 137–147)

## 2013-07-09 LAB — GLUCOSE, CAPILLARY
GLUCOSE-CAPILLARY: 102 mg/dL — AB (ref 70–99)
GLUCOSE-CAPILLARY: 150 mg/dL — AB (ref 70–99)
Glucose-Capillary: 196 mg/dL — ABNORMAL HIGH (ref 70–99)
Glucose-Capillary: 150 mg/dL — ABNORMAL HIGH (ref 70–99)
Glucose-Capillary: 152 mg/dL — ABNORMAL HIGH (ref 70–99)

## 2013-07-09 LAB — CBC
HCT: 25.4 % — ABNORMAL LOW (ref 36.0–46.0)
Hemoglobin: 8.7 g/dL — ABNORMAL LOW (ref 12.0–15.0)
MCH: 31.1 pg (ref 26.0–34.0)
MCHC: 34.3 g/dL (ref 30.0–36.0)
MCV: 90.7 fL (ref 78.0–100.0)
PLATELETS: 97 10*3/uL — AB (ref 150–400)
RBC: 2.8 MIL/uL — ABNORMAL LOW (ref 3.87–5.11)
RDW: 17 % — AB (ref 11.5–15.5)
WBC: 3.6 10*3/uL — AB (ref 4.0–10.5)

## 2013-07-09 LAB — CARBOXYHEMOGLOBIN
Carboxyhemoglobin: 1.7 % — ABNORMAL HIGH (ref 0.5–1.5)
METHEMOGLOBIN: 0.8 % (ref 0.0–1.5)
O2 Saturation: 60.9 %
Total hemoglobin: 8.9 g/dL — ABNORMAL LOW (ref 12.0–16.0)

## 2013-07-09 MED ORDER — TORSEMIDE 20 MG PO TABS
40.0000 mg | ORAL_TABLET | Freq: Every day | ORAL | Status: DC
  Filled 2013-07-09: qty 2

## 2013-07-09 NOTE — Progress Notes (Signed)
Patient ID: [name] [name], female   DOB: [birth year], 63 y.o.   MRN: [id number]  ADVANCED HF ROUNDING NOTE  Subjective:   Remains on Milrinone 0.125 with co-ox 60%. Weight down another 3 pounds.   Denies SOB. Denies RUE pain.   Creatinine 1.8>1.9   Objective:  Vital Signs in the last 24 hours: Temp:  [97.6 F (36.4 C)-98.1 F (36.7 C)] 98.1 F (36.7 C) (06/02 0738) Pulse Rate:  [65-70] 68 (06/02 0738) Resp:  [14-18] 14 (06/01 2300) BP: (124-147)/(53-84) 134/84 mmHg (06/02 0738) SpO2:  [96 %-98 %] 96 % (06/02 0738) Weight:  [118 lb 4.8 oz (53.661 kg)] 118 lb 4.8 oz (53.661 kg) (06/02 6599)  Intake/Output from previous day:  Intake/Output Summary (Last 24 hours) at 07/09/13 0833 Last data filed at 07/08/13 1700  Gross per 24 hour  Intake   18.9 ml  Output    750 ml  Net -731.1 ml    Physical Exam: CVP ~10 General:  Elderly. Chronically ill appearing. No resp difficulty. Sitting up in bed HEENT: normal Neck: supple. JVP 10  Carotids 2+ bilat; no bruits. No lymphadenopathy or thryomegaly appreciated. Cor: PMI nondisplaced. Regular rate & rhythm. 2/6 MR/TR +s3 Lungs: clear Abdomen: soft, nontender, nondistended. No hepatosplenomegaly. No bruits or masses. Good bowel sounds. Extremities: no cyanosis, clubbing, rash, RUE 2-3+ edema warm. RUE with double lumen PICC Neuro: alert & orientedx3, cranial nerves grossly intact. moves all 4 extremities w/o difficulty. Affect pleasant    Rhythm: SR 70s   Scheduled Meds: . aspirin EC  81 mg Oral Daily  . atorvastatin  20 mg Oral q1800  . carvedilol  3.125 mg Oral BID WC  . enoxaparin (LOVENOX) injection  30 mg Subcutaneous Q24H  . furosemide  80 mg Intravenous 3 times per day  . hydrALAZINE  100 mg Oral 3 times per day  . insulin aspart  0-9 Units Subcutaneous TID WC & HS  . isosorbide mononitrate  90 mg Oral Daily  . pantoprazole  40 mg Oral Q0600  . potassium chloride  20 mEq Oral Daily  . sodium chloride  3 mL Intravenous Q12H    Continuous Infusions: . milrinone 0.125 mcg/kg/min (07/08/13 1600)   PRN Meds:.sodium chloride, acetaminophen, ALPRAZolam, loperamide, nitroGLYCERIN, ondansetron (ZOFRAN) IV, sodium chloride, sodium chloride, zolpidem Lab Results: No results found for this basename: WBC, HGB, PLT,  in the last 72 hours  Recent Labs  07/08/13 0500 07/09/13 0655  NA 134* 133*  K 3.8 4.0  CL 95* 94*  CO2 26 26  GLUCOSE 104* 96  BUN 58* 58*  CREATININE 1.88* 1.96*   No results found for this basename: TROPONINI, CK, MB,  in the last 72 hours No results found for this basename: INR,  in the last 72 hours  Imaging: Imaging results have been reviewed  Cardiac Studies:  Assessment/Plan:  63 yo [name] with poorly controlled DM, HTN, CRI, ICM- EF 30-35% Jan 2015, Severe 2V CAD at cath Jan 2015 (medical Rx), and Pulmonary HTN admitted 06/25/13 with wgt gain, BNP 27K.  1) Acute on chronic systolic HF 2) Acute on chronic renal failure stage IV in setting of cardiorenal syndrome 3) CAD 4) DM2, poorly controlled 5) Thrombocytopenia (previously 100-120k; now 70-80k). Liver imaging normal 2/15 6) Protein calorie malnutrition. Albumin 2.3 7) RUE edema -dopplers negative for DVT 5  PLAN:  CO-OX this am 60% on Milrinone at 0.125 mcg. Volume status improving. CVP 10. Weight down 3 pounds. Creatinine trending up. Stop IV lasix  and Milrinone.  Repeat CO-OX and BMET in am.    BP stable, on coreg 3.125 mg BID. Continue hydrazine 100 mg TID and Imdur 90 mg daily. No ACE-I with CKD.    RUE with 3+ edema. Dopplers on 5/26 negative for DVT.    Conrad Naturita NP-C  07/09/2013 8:33 AM  Patient seen and examined with Darrick Grinder, NP. We discussed all aspects of the encounter. I agree with the assessment and plan as stated above.   Feels much better today. Eager to go home. CVP improved. Weight down to baseline. Agree with stopping milrinone and changing to po diuretics - torsemide 40 bid.   Possibly home  tomorrow. Keep in SDU to follow CVPs and co-ox off inotropes. May need to repeat RUE u/s.  [name] R Damond Borchers,MD 9:04 AM

## 2013-07-09 NOTE — Progress Notes (Signed)
CARDIAC REHAB PHASE I   PRE:  Rate/Rhythm: 61 SR  BP:  Supine:   Sitting: 130/56  Standing:    SaO2: 96%RA  MODE:  Ambulation: 520 ft   POST:  Rate/Rhythm: 63  BP:  Supine:   Sitting: 127/78  Standing:    SaO2: 99%RA 1505-1522 Pt used BSC and then walked 520 ft on RA with rolling walker and asst x 1. Gait steady. Stopped twice to rest. To bed after walk.   Graylon Good, RN BSN  07/09/2013 3:20 PM

## 2013-07-10 DIAGNOSIS — I5033 Acute on chronic diastolic (congestive) heart failure: Secondary | ICD-10-CM

## 2013-07-10 LAB — BASIC METABOLIC PANEL
BUN: 56 mg/dL — ABNORMAL HIGH (ref 6–23)
CALCIUM: 9 mg/dL (ref 8.4–10.5)
CO2: 27 mEq/L (ref 19–32)
Chloride: 94 mEq/L — ABNORMAL LOW (ref 96–112)
Creatinine, Ser: 1.88 mg/dL — ABNORMAL HIGH (ref 0.50–1.10)
GFR calc Af Amer: 32 mL/min — ABNORMAL LOW (ref >90)
GFR, EST NON AFRICAN AMERICAN: 27 mL/min — AB (ref >90)
GLUCOSE: 92 mg/dL (ref 70–99)
POTASSIUM: 3.9 meq/L (ref 3.7–5.3)
Sodium: 133 mEq/L — ABNORMAL LOW (ref 137–147)

## 2013-07-10 LAB — CARBOXYHEMOGLOBIN
Carboxyhemoglobin: 2 % — ABNORMAL HIGH (ref 0.5–1.5)
Carboxyhemoglobin: 1.7 % — ABNORMAL HIGH (ref 0.5–1.5)
Methemoglobin: 0.6 % (ref 0.0–1.5)
Methemoglobin: 0.7 % (ref 0.0–1.5)
O2 SAT: 99.6 %
O2 SAT: 60 %
TOTAL HEMOGLOBIN: 9.3 g/dL — AB (ref 12.0–16.0)
Total hemoglobin: 11.2 g/dL — ABNORMAL LOW (ref 12.0–16.0)

## 2013-07-10 LAB — GLUCOSE, CAPILLARY
GLUCOSE-CAPILLARY: 169 mg/dL — AB (ref 70–99)
Glucose-Capillary: 87 mg/dL (ref 70–99)

## 2013-07-10 MED ORDER — CARVEDILOL 3.125 MG PO TABS: 3.1250 mg | ORAL_TABLET | Freq: Two times a day (BID) | ORAL | Status: DC

## 2013-07-10 MED ORDER — TORSEMIDE 20 MG PO TABS
40.0000 mg | ORAL_TABLET | Freq: Two times a day (BID) | ORAL | Status: DC
  Administered 2013-07-10: 40 mg via ORAL
  Filled 2013-07-10 (×3): qty 2

## 2013-07-10 MED ORDER — POTASSIUM CHLORIDE CRYS ER 20 MEQ PO TBCR: 20.0000 meq | EXTENDED_RELEASE_TABLET | Freq: Every day | ORAL | Status: DC

## 2013-07-10 MED ORDER — HYDRALAZINE HCL 100 MG PO TABS: 100.0000 mg | ORAL_TABLET | Freq: Three times a day (TID) | ORAL | Status: DC

## 2013-07-10 MED ORDER — TORSEMIDE 20 MG PO TABS: 40.0000 mg | ORAL_TABLET | Freq: Two times a day (BID) | ORAL | Status: DC

## 2013-07-10 MED ORDER — POTASSIUM CHLORIDE CRYS ER 20 MEQ PO TBCR: 20.0000 meq | EXTENDED_RELEASE_TABLET | Freq: Two times a day (BID) | ORAL | Status: DC

## 2013-07-10 MED ORDER — POTASSIUM CHLORIDE CRYS ER 20 MEQ PO TBCR
20.0000 meq | EXTENDED_RELEASE_TABLET | Freq: Every day | ORAL | Status: DC
  Administered 2013-07-10: 20 meq via ORAL

## 2013-07-10 MED ORDER — ATORVASTATIN CALCIUM 20 MG PO TABS: 20.0000 mg | ORAL_TABLET | Freq: Every day | ORAL | Status: DC

## 2013-07-10 NOTE — Progress Notes (Addendum)
Patient discharged to home accompanied by son to private vehicle to home, transported via wheelchair to private vehicle, reviewed discharge instructions and med reconc with patient and son, patient has some language barriers, verified understanding via teachback, reviewed signs and symptoms of CHF and importance of daily weight and symptom monitoring, and when to call MD, also where to obtain meds and when next dose due, verbalized understanding via teachback, teaching done for care of right arm s/p PICC, reviewed precautions for 24 hours, Hazle Nordmann RN

## 2013-07-10 NOTE — Progress Notes (Signed)
Patient ID: [name] [name], female   DOB: [birth year], 63 y.o.   MRN: [id number]  ADVANCED HF ROUNDING NOTE  Subjective:   Yesterday she continued on IV lasix and milrinone was stopped. Weight down another 3 pounds.   Denies SOB.   Creatinine 1.8>1.9 >1.8 CO-OX 99> repeat now   Objective:  Vital Signs in the last 24 hours: Temp:  [97.2 F (36.2 C)-98.6 F (37 C)] 98 F (36.7 C) (06/03 0802) Pulse Rate:  [54-64] 64 (06/03 0802) Resp:  [14-16] 14 (06/03 0300) BP: (108-143)/(53-63) 143/59 mmHg (06/03 0802) SpO2:  [96 %-97 %] 97 % (06/03 0802) Weight:  [115 lb 8.3 oz (52.4 kg)] 115 lb 8.3 oz (52.4 kg) (06/03 0300)  Intake/Output from previous day:  Intake/Output Summary (Last 24 hours) at 07/10/13 0813 Last data filed at 07/09/13 2300  Gross per 24 hour  Intake      0 ml  Output    200 ml  Net   -200 ml    Physical Exam:  General:  Elderly. Chronically ill appearing. No resp difficulty. Sitting up in bed HEENT: normal Neck: supple. JVP ~7-8    Carotids 2+ bilat; no bruits. No lymphadenopathy or thryomegaly appreciated. Cor: PMI nondisplaced. Regular rate & rhythm. 2/6 MR/TR +s3 Lungs: clear Abdomen: soft, nontender, nondistended. No hepatosplenomegaly. No bruits or masses. Good bowel sounds. Extremities: no cyanosis, clubbing, rash, RUE 1+ edema warm. RUE with double lumen PICC Neuro: alert & orientedx3, cranial nerves grossly intact. moves all 4 extremities w/o difficulty. Affect pleasant    Rhythm: SR 70s   Scheduled Meds: . aspirin EC  81 mg Oral Daily  . atorvastatin  20 mg Oral q1800  . carvedilol  3.125 mg Oral BID WC  . enoxaparin (LOVENOX) injection  30 mg Subcutaneous Q24H  . furosemide  80 mg Intravenous 3 times per day  . hydrALAZINE  100 mg Oral 3 times per day  . insulin aspart  0-9 Units Subcutaneous TID WC & HS  . isosorbide mononitrate  90 mg Oral Daily  . pantoprazole  40 mg Oral Q0600  . potassium chloride  20 mEq Oral Daily  . sodium chloride  3 mL  Intravenous Q12H   Continuous Infusions:   PRN Meds:.sodium chloride, acetaminophen, ALPRAZolam, loperamide, nitroGLYCERIN, ondansetron (ZOFRAN) IV, sodium chloride, sodium chloride, zolpidem Lab Results:  Recent Labs  07/09/13 1015  WBC 3.6*  HGB 8.7*  PLT 97*    Recent Labs  07/09/13 0655 07/10/13 0625  NA 133* 133*  K 4.0 3.9  CL 94* 94*  CO2 26 27  GLUCOSE 96 92  BUN 58* 56*  CREATININE 1.96* 1.88*   No results found for this basename: TROPONINI, CK, MB,  in the last 72 hours No results found for this basename: INR,  in the last 72 hours  Imaging: Imaging results have been reviewed  Cardiac Studies:  Assessment/Plan:  63 yo [name] with poorly controlled DM, HTN, CRI, ICM- EF 30-35% Jan 2015, Severe 2V CAD at cath Jan 2015 (medical Rx), and Pulmonary HTN admitted 06/25/13 with wgt gain, BNP 27K.  1) Acute on chronic systolic HF 2) Acute on chronic renal failure stage IV in setting of cardiorenal syndrome 3) CAD 4) DM2, poorly controlled 5) Thrombocytopenia (previously 100-120k; now 70-80k). Liver imaging normal 2/15 6) Protein calorie malnutrition. Albumin 2.3 7) RUE edema -dopplers negative for DVT 5  PLAN:  Repeat CO-OX pending. Off Milrinone. Volume status stable. Stop IV lasix and switch to torsemide 40 mg  tiwce a day. Weight down 3 pounds. Renal function ok. rending up.   BP stable, on coreg 3.125 mg BID. Continue hydrazine 100 mg TID and Imdur 90 mg daily. No ACE-I with CKD.    RUE edema improved.   Case Manager to set up [facility].   Likely home today if CO-OX ok   Amy D Clegg NP-C  07/10/2013 8:13 AM  Patient seen and examined with Darrick Grinder, NP. We discussed all aspects of the encounter. I agree with the assessment and plan as stated above. [place] for d/c home today. See d/c summary for full details.   Rashawn Rayman,MD 8:20 PM

## 2013-07-10 NOTE — Discharge Summary (Signed)
Advanced Heart Failure Team  Discharge Summary   Patient ID: [name] [name] MRN: [id number], DOB/AGE: 12-Jul-1950 63 y.o. Admit date: 06/25/2013 D/C date:     07/10/2013   Primary Discharge Diagnoses:   1) Acute/Chronic Systolic HF -ECHO 14-43% Grade II DD. Required short term Milrinone.  2) Acute/ Chronic renal failure stage IV in setting of cardiorenal syndrome  3) CAD  4) DM2, poorly controlled  5) Thrombocytopenia (previously 100-120k; now 70-80k). Liver imaging normal 2/15  6) Protein calorie malnutrition. Albumin 2.3  7) RUE edema -dopplers negative for DVT 07/02/13    Hospital Course:  [name] [name] is a 63 y.o. female non-smoker with a PMH significant for CAD, DM, HTN, and CHF 2/2 pulmonary hypertension (EF 30-35% 02/2013) who presented to the ER c/o worsening weight gain.   She was initially diuresed with IV lasix but had sluggish response. Due to worsening renal function and increased dyspnea she transferred to stepdown. PICC line was placed to manage CVPs and follow mixed venous saturations. She was started to on Milrinone at 0.25 mcg and diuresed with IV lasix. As her volume status improved she was transitioned off IV lasix to torsemide 40 mg twice day. Milrinone was weaned off to [facility] mixed venous saturations of 60%. Renal function was followed closely and continued to improve with creatinine at discharge 1.8. She will continue on low dose carvedilol and hydralazine 100 mg tid and Imdur 90 daily. She is not on an Ace due to elevated creatinine. Overall she diuresed 18 pounds.   She also had RUE edema while hospitalized. Dopplers performed and this was negative for DVT. PICC removed from RUE on the day of discharge.   I contacted her son, [name] [name], to discuss discharge instructions and home diuretic regimen.  He verbalized understanding. She will continue to be followed closely in the HF clinic with follow up June 10 at 8:30 am. [facility] will follow for heart failure management.   Discharge  Weight Range:  Discharge Weight 115 pounds.  Discharge Vitals: Blood pressure 143/59, pulse 64, temperature 98 F (36.7 C), temperature source Oral, resp. rate 16, height 5' (1.524 m), weight 115 lb 8.3 oz (52.4 kg), SpO2 97.00%.  Labs: Lab Results  Component Value Date   WBC 3.6* 07/09/2013   HGB 8.7* 07/09/2013   HCT 25.4* 07/09/2013   MCV 90.7 07/09/2013   PLT 97* 07/09/2013    Recent Labs Lab 07/10/13 0625  NA 133*  K 3.9  CL 94*  CO2 27  BUN 56*  CREATININE 1.88*  CALCIUM 9.0  GLUCOSE 92   No results found for this basename: CHOL, HDL, LDLCALC, TRIG   BNP (last 3 results)  Recent Labs  08/22/12 0930 03/05/13 2204 06/25/13 1129  PROBNP 6331.0* 21512.0* 27880.0*    Diagnostic Studies/Procedures   No results found.  Discharge Medications     Medication List    STOP taking these medications       furosemide 40 MG tablet  Commonly known as:  LASIX     sodium bicarbonate 650 MG tablet      TAKE these medications       ANTI-DIARRHEAL PO  Take 2 tablets by mouth daily as needed (diarrhea).     aspirin 81 MG EC tablet  Take 1 tablet (81 mg total) by mouth daily.     atorvastatin 20 MG tablet  Commonly known as:  LIPITOR  Take 1 tablet (20 mg total) by mouth daily at 6 PM.  carvedilol 3.125 MG tablet  Commonly known as:  COREG  Take 1 tablet (3.125 mg total) by mouth 2 (two) times daily with a meal.     glipiZIDE 2.5 MG 24 hr tablet  Commonly known as:  GLUCOTROL XL  Take 1 tablet (2.5 mg total) by mouth daily with breakfast.     hydrALAZINE 100 MG tablet  Commonly known as:  APRESOLINE  Take 1 tablet (100 mg total) by mouth 3 (three) times daily.     isosorbide mononitrate 60 MG 24 hr tablet  Commonly known as:  IMDUR  Take 60 mg by mouth 2 (two) times daily.     potassium chloride SA 20 MEQ tablet  Commonly known as:  K-DUR,KLOR-CON  Take 1 tablet (20 mEq total) by mouth daily.     torsemide 20 MG tablet  Commonly known as:  DEMADEX  Take  2 tablets (40 mg total) by mouth 2 (two) times daily.        Disposition   The patient will be discharged in stable condition to home.     Discharge Instructions   Contraindication to ACEI at discharge    Complete by:  As directed      Diet - low sodium heart healthy    Complete by:  As directed      Heart Failure patients record your daily weight using the same scale at the same time of day    Complete by:  As directed      Increase activity slowly    Complete by:  As directed      PICC line removal    Complete by:  As directed           Follow-up Information   Follow up with Glori Bickers, MD On 07/17/2013. (at 8:30 Garage Code 0400)    Specialty:  Cardiology   Contact information:   [address] [place] [address] [phone]         Duration of Discharge Encounter: Greater than 35 minutes   Signed, Conrad Towanda  NP-C  07/10/2013, 10:24 AM  Patient seen and examined with Darrick Grinder, NP. We discussed all aspects of the encounter. I agree with the assessment and plan as stated above. She is ready to go home. Her presentation still remains somewhat confusing to me as she presented as low-output HF with cardiorenal syndrome; however initial co-ox was ok. I suspect she may have had a period of low-output/hypotension leading to ATN which caused her to develop ARF and worsening HF. She responded slowly to milrinone and IV diuresis. We will follow in HF Clinic.   Shaune Pascal [place] Sherr,MD 12:41 AM

## 2013-07-10 NOTE — Progress Notes (Signed)
Per MD order, PICC line removed. Cath intact at 37cm. Vaseline pressure gauze to site, pressure held x 5min. No bleeding to site. Pt instructed to keep dressing CDI x 24 hours. Avoid heavy lifting, pushing or pulling x 24 hours,  If bleeding occurs hold pressure, if bleeding does not stop contact MD or go to the ED. Pt does not have any questions. [name] [name] M  

## 2013-07-10 NOTE — Discharge Instructions (Signed)
Heart Failure Heart failure means your heart has trouble pumping blood. This makes it hard for your body to work well. Heart failure is usually a long-term (chronic) condition. You must take good care of yourself and follow your doctor's treatment plan. HOME CARE  Take your heart medicine as told by your doctor.  Do not stop taking medicine unless your doctor tells you to.  Do not skip any dose of medicine.  Refill your medicines before they run out.  Take other medicines only as told by your doctor or pharmacist.  Stay active if told by your doctor. The elderly and people with severe heart failure should talk with a doctor about physical activity.  Eat heart healthy foods. Choose foods that are without trans fat and are low in saturated fat, cholesterol, and salt (sodium). This includes fresh or frozen fruits and vegetables, fish, lean meats, fat-free or low-fat dairy foods, whole grains, and high-fiber foods. Lentils and dried peas and beans (legumes) are also good choices.  Limit salt if told by your doctor.  Cook in a healthy way. Roast, grill, broil, bake, poach, steam, or stir-fry foods.  Limit fluids as told by your doctor.  Weigh yourself every morning. Do this after you pee (urinate) and before you eat breakfast. Write down your weight to give to your doctor.  Take your blood pressure and write it down if your doctor tell you to.  Ask your doctor how to check your pulse. Check your pulse as told.  Lose weight if told by your doctor.  Stop smoking or chewing tobacco. Do not use gum or patches that help you quit without your doctor's approval.  Schedule and go to doctor visits as told.  Nonpregnant women should have no more than 1 drink a day. Men should have no more than 2 drinks a day. Talk to your doctor about drinking alcohol.  Stop illegal drug use.  Stay current with shots (immunizations).  Manage your health conditions as told by your doctor.  Learn to manage  your stress.  Rest when you are tired.  If it is really hot outside:  Avoid intense activities.  Use air conditioning or fans, or get in a cooler place.  Avoid caffeine and alcohol.  Wear loose-fitting, lightweight, and light-colored clothing.  If it is really cold outside:  Avoid intense activities.  Layer your clothing.  Wear mittens or gloves, a hat, and a scarf when going outside.  Avoid alcohol.  Learn about heart failure and get support as needed.  Get help to maintain or improve your quality of life and your ability to care for yourself as needed. GET HELP IF:   You gain 03 lb/1.4 kg or more in 1 day or 05 lb/2.3 kg in a week.  You are more short of breath than usual.  You cannot do your normal activities.  You tire easily.  You cough more than normal, especially with activity.  You have any or more puffiness (swelling) in areas such as your hands, feet, ankles, or belly (abdomen).  You cannot sleep because it is hard to breathe.  You feel like your heart is beating fast (palpitations).  You get dizzy or lightheaded when you stand up. GET HELP RIGHT AWAY IF:   You have trouble breathing.  There is a change in mental status, such as becoming less alert or not being able to focus.  You have chest pain or discomfort.  You faint. MAKE SURE YOU:   Understand these   instructions.  Will watch your condition.  Will get help right away if you are not doing well or get worse. Document Released: 11/03/2007 Document Revised: 05/21/2012 Document Reviewed: 08/25/2011 [employer] Patient Information 2014 [employer].  

## 2013-07-11 ENCOUNTER — Ambulatory Visit: Payer: Self-pay | Admitting: Internal Medicine

## 2013-07-17 ENCOUNTER — Encounter (HOSPITAL_COMMUNITY): Payer: Self-pay

## 2013-07-17 ENCOUNTER — Inpatient Hospital Stay (HOSPITAL_COMMUNITY): Admit: 2013-07-17 | Payer: Self-pay

## 2013-07-23 ENCOUNTER — Telehealth (HOSPITAL_COMMUNITY): Payer: Self-pay | Admitting: Vascular Surgery

## 2013-07-23 NOTE — Telephone Encounter (Signed)
Margaretha Sheffield from [facility]  Needs to talk to someone concerning pt CBG and blood pressure please advise. Thanks!

## 2013-07-30 NOTE — Telephone Encounter (Signed)
Spoke with [facility] Nurse is having a hard time communicating with pt, unable to utilize phone interpreters as [name] is normally unavailable  Making it very difficult to fully access pt Daughter, unable to be in the home during visits as she is at work Son, is only available on the weekends  Unsure of B/P parameters, unsure how often to check BS No further orders so unable to go back to pts home until this is addressed Pt seen [facility] in 06/2013 No show'd appt 6/4 No show'd appt 6/10 with CHF clinic   Will speak with providers and call Margaretha Sheffield back

## 2013-08-02 NOTE — Telephone Encounter (Signed)
Spoke w/[facility] RN she states her orders are up on pt and she has had a very difficult time, family does not help out with pt and translating is very difficult, family has not brought pt to appt either, [facility] will have to sign off as their orders are up advised ok, if pt comes in for appt will re-refer however pt has no showed several times, letter was mailed to pt's home to call to sch appt, as pt's son and 1 daughter to speak english

## 2013-10-15 ENCOUNTER — Inpatient Hospital Stay (HOSPITAL_COMMUNITY): Payer: Medicaid Other

## 2013-10-15 ENCOUNTER — Inpatient Hospital Stay (HOSPITAL_COMMUNITY)
Admission: EM | Admit: 2013-10-15 | Discharge: 2013-10-21 | DRG: 291 | Disposition: A | Discharge status: Home / Self Care | Payer: Medicaid Other | Attending: Internal Medicine | Admitting: Internal Medicine

## 2013-10-15 ENCOUNTER — Emergency Department (HOSPITAL_COMMUNITY): Payer: Medicaid Other

## 2013-10-15 ENCOUNTER — Encounter (HOSPITAL_COMMUNITY): Payer: Self-pay | Admitting: Emergency Medicine

## 2013-10-15 DIAGNOSIS — N179 Acute kidney failure, unspecified: Secondary | ICD-10-CM | POA: Diagnosis present

## 2013-10-15 DIAGNOSIS — K921 Melena: Secondary | ICD-10-CM | POA: Diagnosis present

## 2013-10-15 DIAGNOSIS — N189 Chronic kidney disease, unspecified: Secondary | ICD-10-CM

## 2013-10-15 DIAGNOSIS — G9340 Encephalopathy, unspecified: Secondary | ICD-10-CM | POA: Diagnosis present

## 2013-10-15 DIAGNOSIS — Z23 Encounter for immunization: Secondary | ICD-10-CM | POA: Diagnosis not present

## 2013-10-15 DIAGNOSIS — I10 Essential (primary) hypertension: Secondary | ICD-10-CM

## 2013-10-15 DIAGNOSIS — E876 Hypokalemia: Secondary | ICD-10-CM | POA: Diagnosis present

## 2013-10-15 DIAGNOSIS — I5023 Acute on chronic systolic (congestive) heart failure: Principal | ICD-10-CM | POA: Diagnosis present

## 2013-10-15 DIAGNOSIS — N039 Chronic nephritic syndrome with unspecified morphologic changes: Secondary | ICD-10-CM

## 2013-10-15 DIAGNOSIS — I498 Other specified cardiac arrhythmias: Secondary | ICD-10-CM | POA: Diagnosis present

## 2013-10-15 DIAGNOSIS — E46 Unspecified protein-calorie malnutrition: Secondary | ICD-10-CM | POA: Diagnosis present

## 2013-10-15 DIAGNOSIS — J449 Chronic obstructive pulmonary disease, unspecified: Secondary | ICD-10-CM | POA: Diagnosis present

## 2013-10-15 DIAGNOSIS — D62 Acute posthemorrhagic anemia: Secondary | ICD-10-CM | POA: Diagnosis present

## 2013-10-15 DIAGNOSIS — T68XXXA Hypothermia, initial encounter: Secondary | ICD-10-CM | POA: Diagnosis present

## 2013-10-15 DIAGNOSIS — I130 Hypertensive heart and chronic kidney disease with heart failure and stage 1 through stage 4 chronic kidney disease, or unspecified chronic kidney disease: Secondary | ICD-10-CM | POA: Diagnosis present

## 2013-10-15 DIAGNOSIS — Z79899 Other long term (current) drug therapy: Secondary | ICD-10-CM

## 2013-10-15 DIAGNOSIS — R188 Other ascites: Secondary | ICD-10-CM | POA: Diagnosis present

## 2013-10-15 DIAGNOSIS — Y92009 Unspecified place in unspecified non-institutional (private) residence as the place of occurrence of the external cause: Secondary | ICD-10-CM | POA: Diagnosis not present

## 2013-10-15 DIAGNOSIS — I2789 Other specified pulmonary heart diseases: Secondary | ICD-10-CM | POA: Diagnosis present

## 2013-10-15 DIAGNOSIS — I5022 Chronic systolic (congestive) heart failure: Secondary | ICD-10-CM | POA: Diagnosis present

## 2013-10-15 DIAGNOSIS — R0681 Apnea, not elsewhere classified: Secondary | ICD-10-CM | POA: Diagnosis present

## 2013-10-15 DIAGNOSIS — J9601 Acute respiratory failure with hypoxia: Secondary | ICD-10-CM

## 2013-10-15 DIAGNOSIS — M109 Gout, unspecified: Secondary | ICD-10-CM | POA: Diagnosis not present

## 2013-10-15 DIAGNOSIS — J4489 Other specified chronic obstructive pulmonary disease: Secondary | ICD-10-CM | POA: Diagnosis present

## 2013-10-15 DIAGNOSIS — D696 Thrombocytopenia, unspecified: Secondary | ICD-10-CM | POA: Diagnosis present

## 2013-10-15 DIAGNOSIS — J811 Chronic pulmonary edema: Secondary | ICD-10-CM | POA: Diagnosis present

## 2013-10-15 DIAGNOSIS — D649 Anemia, unspecified: Secondary | ICD-10-CM | POA: Diagnosis present

## 2013-10-15 DIAGNOSIS — J690 Pneumonitis due to inhalation of food and vomit: Secondary | ICD-10-CM | POA: Diagnosis present

## 2013-10-15 DIAGNOSIS — K922 Gastrointestinal hemorrhage, unspecified: Secondary | ICD-10-CM | POA: Diagnosis present

## 2013-10-15 DIAGNOSIS — I2589 Other forms of chronic ischemic heart disease: Secondary | ICD-10-CM | POA: Diagnosis present

## 2013-10-15 DIAGNOSIS — I2510 Atherosclerotic heart disease of native coronary artery without angina pectoris: Secondary | ICD-10-CM | POA: Diagnosis present

## 2013-10-15 DIAGNOSIS — I5033 Acute on chronic diastolic (congestive) heart failure: Secondary | ICD-10-CM

## 2013-10-15 DIAGNOSIS — E1169 Type 2 diabetes mellitus with other specified complication: Secondary | ICD-10-CM | POA: Diagnosis present

## 2013-10-15 DIAGNOSIS — N184 Chronic kidney disease, stage 4 (severe): Secondary | ICD-10-CM | POA: Diagnosis present

## 2013-10-15 DIAGNOSIS — J9600 Acute respiratory failure, unspecified whether with hypoxia or hypercapnia: Secondary | ICD-10-CM | POA: Diagnosis present

## 2013-10-15 DIAGNOSIS — M10042 Idiopathic gout, left hand: Secondary | ICD-10-CM

## 2013-10-15 DIAGNOSIS — D631 Anemia in chronic kidney disease: Secondary | ICD-10-CM

## 2013-10-15 DIAGNOSIS — E119 Type 2 diabetes mellitus without complications: Secondary | ICD-10-CM | POA: Diagnosis present

## 2013-10-15 DIAGNOSIS — I5043 Acute on chronic combined systolic (congestive) and diastolic (congestive) heart failure: Secondary | ICD-10-CM

## 2013-10-15 DIAGNOSIS — I509 Heart failure, unspecified: Secondary | ICD-10-CM | POA: Diagnosis present

## 2013-10-15 DIAGNOSIS — M79642 Pain in left hand: Secondary | ICD-10-CM

## 2013-10-15 DIAGNOSIS — J810 Acute pulmonary edema: Secondary | ICD-10-CM

## 2013-10-15 DIAGNOSIS — I272 Pulmonary hypertension, unspecified: Secondary | ICD-10-CM | POA: Diagnosis present

## 2013-10-15 HISTORY — DX: Chronic systolic (congestive) heart failure: I50.22

## 2013-10-15 HISTORY — DX: Problem related to social environment, unspecified: Z60.9

## 2013-10-15 HISTORY — DX: Reserved for concepts with insufficient information to code with codable children: IMO0002

## 2013-10-15 HISTORY — DX: Pulmonary hypertension, unspecified: I27.20

## 2013-10-15 HISTORY — DX: Edema, unspecified: R60.9

## 2013-10-15 HISTORY — DX: Unspecified protein-calorie malnutrition: E46

## 2013-10-15 HISTORY — DX: Type 2 diabetes mellitus with hyperglycemia: E11.65

## 2013-10-15 HISTORY — DX: Essential (primary) hypertension: I10

## 2013-10-15 HISTORY — DX: Problem related to unspecified psychosocial circumstances: Z65.9

## 2013-10-15 HISTORY — DX: Endocarditis, valve unspecified: I38

## 2013-10-15 HISTORY — DX: Other ascites: R18.8

## 2013-10-15 HISTORY — DX: Chronic kidney disease, stage 4 (severe): N18.4

## 2013-10-15 HISTORY — DX: Atherosclerotic heart disease of native coronary artery without angina pectoris: I25.10

## 2013-10-15 LAB — TROPONIN I: Troponin I: <0.3 ng/mL (ref <0.30)

## 2013-10-15 LAB — URINALYSIS, ROUTINE W REFLEX MICROSCOPIC
Bilirubin Urine: NEGATIVE
Glucose, UA: NEGATIVE mg/dL
Hgb urine dipstick: NEGATIVE
Ketones, ur: NEGATIVE mg/dL
Leukocytes, UA: NEGATIVE
Nitrite: NEGATIVE
Protein, ur: NEGATIVE mg/dL
Specific Gravity, Urine: 1.012 (ref 1.005–1.030)
Urobilinogen, UA: 0.2 mg/dL (ref 0.0–1.0)
pH: 5.5 (ref 5.0–8.0)

## 2013-10-15 LAB — CBC WITH DIFFERENTIAL/PLATELET
Basophils Absolute: 0 10*3/uL (ref 0.0–0.1)
Basophils Relative: 0 % (ref 0–1)
Eosinophils Absolute: 0 10*3/uL (ref 0.0–0.7)
Eosinophils Relative: 0 % (ref 0–5)
HCT: 17.8 % — ABNORMAL LOW (ref 36.0–46.0)
Hemoglobin: 6 g/dL — CL (ref 12.0–15.0)
Lymphocytes Relative: 11 % — ABNORMAL LOW (ref 12–46)
Lymphs Abs: 0.6 10*3/uL — ABNORMAL LOW (ref 0.7–4.0)
MCH: 30.3 pg (ref 26.0–34.0)
MCHC: 33.7 g/dL (ref 30.0–36.0)
MCV: 89.9 fL (ref 78.0–100.0)
Monocytes Absolute: 0.3 10*3/uL (ref 0.1–1.0)
Monocytes Relative: 5 % (ref 3–12)
Neutro Abs: 4.6 10*3/uL (ref 1.7–7.7)
Neutrophils Relative %: 84 % — ABNORMAL HIGH (ref 43–77)
Platelets: 91 10*3/uL — ABNORMAL LOW (ref 150–400)
RBC: 1.98 MIL/uL — ABNORMAL LOW (ref 3.87–5.11)
RDW: 16.3 % — ABNORMAL HIGH (ref 11.5–15.5)
WBC: 5.5 10*3/uL (ref 4.0–10.5)

## 2013-10-15 LAB — I-STAT ARTERIAL BLOOD GAS, ED
Acid-Base Excess: 4 mmol/L — ABNORMAL HIGH (ref 0.0–2.0)
Bicarbonate: 28.3 mEq/L — ABNORMAL HIGH (ref 20.0–24.0)
O2 Saturation: 100 %
Patient temperature: 98.6
TCO2: 29 mmol/L (ref 0–100)
pCO2 arterial: 38.4 mmHg (ref 35.0–45.0)
pH, Arterial: 7.475 — ABNORMAL HIGH (ref 7.350–7.450)
pO2, Arterial: 461 mmHg — ABNORMAL HIGH (ref 80.0–100.0)

## 2013-10-15 LAB — CARBOXYHEMOGLOBIN
Carboxyhemoglobin: 1.2 % (ref 0.5–1.5)
Methemoglobin: 1 % (ref 0.0–1.5)
O2 Saturation: 69.7 %
TOTAL HEMOGLOBIN: 10.6 g/dL — AB (ref 12.0–16.0)

## 2013-10-15 LAB — PREPARE RBC (CROSSMATCH)

## 2013-10-15 LAB — BASIC METABOLIC PANEL
Anion gap: 16 — ABNORMAL HIGH (ref 5–15)
BUN: 74 mg/dL — ABNORMAL HIGH (ref 6–23)
CO2: 21 mEq/L (ref 19–32)
Calcium: 8.1 mg/dL — ABNORMAL LOW (ref 8.4–10.5)
Chloride: 99 mEq/L (ref 96–112)
Creatinine, Ser: 1.62 mg/dL — ABNORMAL HIGH (ref 0.50–1.10)
GFR calc Af Amer: 38 mL/min — ABNORMAL LOW (ref >90)
GFR calc non Af Amer: 33 mL/min — ABNORMAL LOW (ref >90)
Glucose, Bld: 34 mg/dL — CL (ref 70–99)
Potassium: 3.6 mEq/L — ABNORMAL LOW (ref 3.7–5.3)
Sodium: 136 mEq/L — ABNORMAL LOW (ref 137–147)

## 2013-10-15 LAB — LACTIC ACID, PLASMA
Lactic Acid, Venous: 1 mmol/L (ref 0.5–2.2)
Lactic Acid, Venous: 1.1 mmol/L (ref 0.5–2.2)

## 2013-10-15 LAB — GLUCOSE, CAPILLARY
GLUCOSE-CAPILLARY: 107 mg/dL — AB (ref 70–99)
GLUCOSE-CAPILLARY: 82 mg/dL (ref 70–99)

## 2013-10-15 LAB — PROTIME-INR
INR: 1.27 (ref 0.00–1.49)
Prothrombin Time: 15.9 seconds — ABNORMAL HIGH (ref 11.6–15.2)

## 2013-10-15 LAB — PRO B NATRIURETIC PEPTIDE: Pro B Natriuretic peptide (BNP): 24313 pg/mL — ABNORMAL HIGH (ref 0–125)

## 2013-10-15 LAB — ABO/RH: ABO/RH(D): B POS

## 2013-10-15 LAB — POC OCCULT BLOOD, ED: Fecal Occult Bld: POSITIVE — AB

## 2013-10-15 LAB — MRSA PCR SCREENING: MRSA BY PCR: NEGATIVE

## 2013-10-15 LAB — TSH: TSH: 2.79 u[IU]/mL (ref 0.350–4.500)

## 2013-10-15 LAB — CBG MONITORING, ED
Glucose-Capillary: 138 mg/dL — ABNORMAL HIGH (ref 70–99)
Glucose-Capillary: 37 mg/dL — CL (ref 70–99)

## 2013-10-15 LAB — PROCALCITONIN: Procalcitonin: 0.29 ng/mL

## 2013-10-15 MED ORDER — ETOMIDATE 2 MG/ML IV SOLN
INTRAVENOUS | Status: AC | PRN
  Administered 2013-10-15: 20 mg via INTRAVENOUS

## 2013-10-15 MED ORDER — LIDOCAINE HCL (CARDIAC) 20 MG/ML IV SOLN
INTRAVENOUS | Status: AC
  Filled 2013-10-15: qty 5

## 2013-10-15 MED ORDER — SODIUM CHLORIDE 0.9 % IV SOLN
10.0000 mL/h | Freq: Once | INTRAVENOUS | Status: AC
  Administered 2013-10-15: 10 mL/h via INTRAVENOUS

## 2013-10-15 MED ORDER — SUCCINYLCHOLINE CHLORIDE 20 MG/ML IJ SOLN
INTRAMUSCULAR | Status: AC
  Filled 2013-10-15: qty 1

## 2013-10-15 MED ORDER — ROCURONIUM BROMIDE 50 MG/5ML IV SOLN
INTRAVENOUS | Status: AC
  Filled 2013-10-15: qty 2

## 2013-10-15 MED ORDER — PANTOPRAZOLE SODIUM 40 MG IV SOLR
40.0000 mg | Freq: Every day | INTRAVENOUS | Status: DC
  Administered 2013-10-15: 40 mg via INTRAVENOUS
  Filled 2013-10-15 (×2): qty 40

## 2013-10-15 MED ORDER — SODIUM CHLORIDE 0.9 % IJ SOLN: 3.0000 mL | INTRAMUSCULAR | Status: DC | PRN

## 2013-10-15 MED ORDER — SODIUM CHLORIDE 0.9 % IV SOLN
INTRAVENOUS | Status: DC | PRN
  Administered 2013-10-15: 23:00:00 via INTRAVENOUS

## 2013-10-15 MED ORDER — SODIUM CHLORIDE 0.9 % IV SOLN
250.0000 mL | INTRAVENOUS | Status: DC | PRN
Start: 2013-10-15 — End: 2013-10-16

## 2013-10-15 MED ORDER — POTASSIUM CHLORIDE 20 MEQ/15ML (10%) PO LIQD
40.0000 meq | Freq: Once | ORAL | Status: AC
  Administered 2013-10-15: 40 meq
  Filled 2013-10-15: qty 30

## 2013-10-15 MED ORDER — CETYLPYRIDINIUM CHLORIDE 0.05 % MT LIQD
7.0000 mL | Freq: Four times a day (QID) | OROMUCOSAL | Status: DC
  Administered 2013-10-16 (×4): 7 mL via OROMUCOSAL

## 2013-10-15 MED ORDER — ETOMIDATE 2 MG/ML IV SOLN
INTRAVENOUS | Status: AC
  Filled 2013-10-15: qty 20

## 2013-10-15 MED ORDER — FUROSEMIDE 10 MG/ML IJ SOLN
INTRAMUSCULAR | Status: AC
  Filled 2013-10-15: qty 4

## 2013-10-15 MED ORDER — FENTANYL CITRATE 0.05 MG/ML IJ SOLN
100.0000 ug | INTRAMUSCULAR | Status: DC | PRN
  Filled 2013-10-15: qty 2

## 2013-10-15 MED ORDER — SODIUM CHLORIDE 0.9 % IV SOLN: 250.0000 mL | INTRAVENOUS | Status: DC | PRN

## 2013-10-15 MED ORDER — FENTANYL CITRATE 0.05 MG/ML IJ SOLN: 100.0000 ug | INTRAMUSCULAR | Status: DC | PRN

## 2013-10-15 MED ORDER — SODIUM CHLORIDE 0.9 % IV SOLN
1.5000 g | Freq: Two times a day (BID) | INTRAVENOUS | Status: DC
  Administered 2013-10-15 – 2013-10-16 (×2): 1.5 g via INTRAVENOUS
  Filled 2013-10-15 (×3): qty 1.5

## 2013-10-15 MED ORDER — ROCURONIUM BROMIDE 50 MG/5ML IV SOLN
INTRAVENOUS | Status: AC | PRN
Start: 2013-10-15 — End: 2013-10-15
  Administered 2013-10-15: 60 mg via INTRAVENOUS

## 2013-10-15 MED ORDER — PIPERACILLIN-TAZOBACTAM 3.375 G IVPB 30 MIN
3.3750 g | Freq: Once | INTRAVENOUS | Status: AC
  Administered 2013-10-15: 3.375 g via INTRAVENOUS
  Filled 2013-10-15: qty 50

## 2013-10-15 MED ORDER — ALBUTEROL SULFATE (2.5 MG/3ML) 0.083% IN NEBU: 2.5000 mg | INHALATION_SOLUTION | RESPIRATORY_TRACT | Status: DC | PRN

## 2013-10-15 MED ORDER — DEXTROSE 50 % IV SOLN
50.0000 mL | Freq: Once | INTRAVENOUS | Status: AC
  Administered 2013-10-15: 50 mL via INTRAVENOUS

## 2013-10-15 MED ORDER — SODIUM CHLORIDE 0.9 % IJ SOLN
3.0000 mL | Freq: Two times a day (BID) | INTRAMUSCULAR | Status: DC
  Administered 2013-10-15 (×2): 3 mL via INTRAVENOUS
  Administered 2013-10-16: 6 mL via INTRAVENOUS

## 2013-10-15 MED ORDER — FUROSEMIDE 10 MG/ML IJ SOLN
40.0000 mg | Freq: Once | INTRAMUSCULAR | Status: AC
  Administered 2013-10-15: 40 mg via INTRAVENOUS
  Filled 2013-10-15: qty 4

## 2013-10-15 MED ORDER — VANCOMYCIN HCL IN DEXTROSE 1-5 GM/200ML-% IV SOLN
1000.0000 mg | Freq: Once | INTRAVENOUS | Status: AC
  Administered 2013-10-15: 1000 mg via INTRAVENOUS
  Filled 2013-10-15: qty 200

## 2013-10-15 MED ORDER — CHLORHEXIDINE GLUCONATE 0.12 % MT SOLN
15.0000 mL | Freq: Two times a day (BID) | OROMUCOSAL | Status: DC
  Administered 2013-10-15 – 2013-10-16 (×2): 15 mL via OROMUCOSAL
  Filled 2013-10-15 (×3): qty 15

## 2013-10-15 MED ORDER — INSULIN ASPART 100 UNIT/ML ~~LOC~~ SOLN: 2.0000 [IU] | SUBCUTANEOUS | Status: DC

## 2013-10-15 MED ORDER — FUROSEMIDE 10 MG/ML IJ SOLN
40.0000 mg | Freq: Once | INTRAMUSCULAR | Status: AC
  Administered 2013-10-15: 40 mg via INTRAVENOUS

## 2013-10-15 NOTE — Code Documentation (Signed)
Portable xray at bedside.

## 2013-10-15 NOTE — ED Notes (Signed)
Pt's son at bedside signed blood consent form. Son reports that he last saw the pt normal about 3 days and doesn't think anyone saw her yesterday. Pt had been c/o feeling weak 3 days ago.

## 2013-10-15 NOTE — ED Provider Notes (Signed)
CSN: [id number]     Arrival date & time 10/15/13  1310 History   First MD Initiated Contact with Patient 10/15/13 1332     Chief Complaint  Patient presents with  . Respiratory Distress     (Consider location/radiation/quality/duration/timing/severity/associated sxs/prior Treatment) HPI  63 year old female brought in by EMS. EMS was called by family after patient was found unresponsive. Unfortunately, language barrier with those on scene and details unclear. Not sure of when last seen normal. Unknown if trauma, ingestion or her state of health just prior to this event. The fire department arrived first and found patient hypoventilating and then became apneic. Assisted with bag-valve-mask until paramedics arrived. Initial blood glucose of 71. She had a pulse on arrival and remained with in route. Normo to hypertensive. Paramedics attempted to intubate once orally and nasotracheally w/o success. They did note a gag and she did briefly open her eyes during attempts, but otherwise was not doing anything more neurologically. Twelve-lead EKG showed a sinus rhythm. Reported history of congestive heart failure and diabetes. No meds prior to arrival.   Past Medical History  Diagnosis Date  . CHF (congestive heart failure)   . Diabetes mellitus without complication    No past surgical history on file. No family history on file. History  Substance Use Topics  . Smoking status: Not on file  . Smokeless tobacco: Not on file  . Alcohol Use: Not on file   OB History   Grav Para Term Preterm Abortions TAB SAB Ect Mult Living                 Review of Systems  Level 5 caveat because pt is unresponsive.    Allergies  Review of patient's allergies indicates no known allergies.  Home Medications   Prior to Admission medications   Not on File   BP 167/83  Pulse 58  Resp 14  Ht 5' 4.5" (1.638 m)  Wt 115 lb (52.164 kg)  BMI 19.44 kg/m2  SpO2 100% Physical Exam  Nursing note and vitals  reviewed. Constitutional: She appears distressed.  On stretcher. Being bagged with occasional agonal breathing. No external signs of acute trauma.   HENT:  Head: Normocephalic and atraumatic.  Frothy pinkish secretions in nose and oropharynx  Eyes: Pupils are equal, round, and reactive to light. Right eye exhibits no discharge. Left eye exhibits no discharge.  Cardiovascular: Normal rate, regular rhythm and normal heart sounds.  Exam reveals no gallop and no friction rub.   No murmur heard. Pulmonary/Chest: No respiratory distress.  Nasal trumpet. Bagged. Some agonal respiratory effort. Diminished b/l  Abdominal: Soft. She exhibits distension.  Musculoskeletal: She exhibits edema. She exhibits no tenderness.  Symmetric LE edema  Neurological:  GCS 3.   Skin: Skin is warm and dry.  Psychiatric:  Unable to assess    ED Course  Procedures (including critical care time)  INTUBATION Performed by: Virgel Manifold  Required items: required blood products, implants, devices, and special equipment available Patient identity confirmed: provided demographic data and hospital-assigned identification number Time out: Immediately prior to procedure a "time out" was called to verify the correct patient, procedure, equipment, support staff and site/side marked as required.  Indications: respiratory failure, airway protection  Glidescope Laryngoscopy   Preoxygenation: BVM  Sedatives: Etomidate Paralytic: rocuronium  Tube Size: 7.0 cuffed  Post-procedure assessment: chest rise and ETCO2 monitor Breath sounds: equal and absent over the epigastrium Tube secured with: ETT holder Chest x-ray interpreted by radiologist and me.  Chest x-ray findings: endotracheal tube in appropriate position  Patient tolerated the procedure well with no immediate complications.  CRITICAL CARE Performed by: Virgel Manifold  Total critical care time: 40 minutes  Critical care time was exclusive of  separately billable procedures and treating other patients. Critical care was necessary to treat or prevent imminent or life-threatening deterioration. Critical care was time spent personally by me on the following activities: development of treatment plan with patient and/or surrogate as well as nursing, discussions with consultants, evaluation of patient's response to treatment, examination of patient, obtaining history from patient or surrogate, ordering and performing treatments and interventions, ordering and review of laboratory studies, ordering and review of radiographic studies, pulse oximetry and re-evaluation of patient's condition.     Labs Review Labs Reviewed  CBC WITH DIFFERENTIAL - Abnormal; Notable for the following:    RBC 1.98 (*)    Hemoglobin 6.0 (*)    HCT 17.8 (*)    RDW 16.3 (*)    Platelets 91 (*)    Neutrophils Relative % 84 (*)    Lymphocytes Relative 11 (*)    Lymphs Abs 0.6 (*)    All other components within normal limits  BASIC METABOLIC PANEL - Abnormal; Notable for the following:    Sodium 136 (*)    Potassium 3.6 (*)    Glucose, Bld 34 (*)    BUN 74 (*)    Creatinine, Ser 1.62 (*)    Calcium 8.1 (*)    GFR calc non Af Amer 33 (*)    GFR calc Af Amer 38 (*)    Anion gap 16 (*)    All other components within normal limits  PRO B NATRIURETIC PEPTIDE - Abnormal; Notable for the following:    Pro B Natriuretic peptide (BNP) 24313.0 (*)    All other components within normal limits  PROTIME-INR - Abnormal; Notable for the following:    Prothrombin Time 15.9 (*)    All other components within normal limits  CBG MONITORING, ED - Abnormal; Notable for the following:    Glucose-Capillary 37 (*)    All other components within normal limits  CBG MONITORING, ED - Abnormal; Notable for the following:    Glucose-Capillary 138 (*)    All other components within normal limits  POC OCCULT BLOOD, ED - Abnormal; Notable for the following:    Fecal Occult Bld  POSITIVE (*)    All other components within normal limits  CULTURE, BLOOD (ROUTINE X 2)  CULTURE, BLOOD (ROUTINE X 2)  CULTURE, RESPIRATORY (NON-EXPECTORATED)  TROPONIN I  URINALYSIS, ROUTINE W REFLEX MICROSCOPIC  LACTIC ACID, PLASMA  OCCULT BLOOD X 1 CARD TO LAB, STOOL  TROPONIN I  TROPONIN I  LACTIC ACID, PLASMA  LACTIC ACID, PLASMA  PROCALCITONIN  BLOOD GAS, ARTERIAL  TYPE AND SCREEN  PREPARE RBC (CROSSMATCH)  ABO/RH    Imaging Review Ct Head Wo Contrast  10/15/2013   CLINICAL DATA:  Respiratory arrest, altered mental status  EXAM: CT HEAD WITHOUT CONTRAST  TECHNIQUE: Contiguous axial images were obtained from the base of the skull through the vertex without intravenous contrast.  COMPARISON:  None.  FINDINGS: There is no evidence of mass effect, midline shift, or extra-axial fluid collections. There is no evidence of a space-occupying lesion or intracranial hemorrhage. There is no evidence of a cortical-based area of acute infarction. There is generalized cerebral atrophy. There is periventricular white matter low attenuation likely secondary to microangiopathy.  The ventricles and sulci are appropriate for  the patient's age. The basal cisterns are patent.  Visualized portions of the orbits are unremarkable. There is a small amount of fluid in the ethmoid sinuses posteriorly. There is a small amount of fluid in the sphenoid sinus. There is high-density fluid in the posterior nasal passage concerning for blood products. Cerebrovascular atherosclerotic calcifications are noted.  The osseous structures are unremarkable.  IMPRESSION: 1. No acute intracranial pathology. 2. There is high-density fluid in the posterior nasal passage concerning for blood products.   Electronically Signed   By: Kathreen Devoid   On: 10/15/2013 15:02   Dg Chest Portable 1 View  10/15/2013   CLINICAL DATA:  Vaginal bleeding.  Unresponsive.  EXAM: PORTABLE CHEST - 1 VIEW  COMPARISON:  None.  FINDINGS: Endotracheal tube  tip lies 2.4 cm above the chronic. Nasogastric tube passes below the diaphragm well into the stomach.  Cardiac silhouette is mildly enlarged. No mediastinal or hilar masses. There are prominent bronchovascular markings. No lung consolidation or edema. No pleural effusion or gross pneumothorax on this supine study.  Bony thorax is demineralized but intact.  IMPRESSION: 1. Endotracheal tube and nasogastric tube are well positioned. 2. No acute cardiopulmonary disease.   Electronically Signed   By: Lajean Manes M.D.   On: 10/15/2013 13:47     EKG Interpretation   Date/Time:  Tuesday October 15 2013 13:16:12 EDT Ventricular Rate:  62 PR Interval:  220 QRS Duration: 117 QT Interval:  500 QTC Calculation: 508 R Axis:   103 Text Interpretation:  Sinus rhythm Prolonged PR interval QT prolonged  Nonspecific intraventricular conduction delay Low voltage with right axis  deviation No old tracing to compare Confirmed by [place]  MD, [place] ([id number])  on 10/15/2013 4:03:37 PM      MDM   Final diagnoses:  Acute respiratory failure, unspecified whether with hypoxia or hypercapnia    62yF found down at home. Respiratory failure. Clinically suspect HF as precipitant with reported hx and exam.  Intubated on arrival for airway protection. Will cover for possible sepsis with hypothermia and clinical picture not completely clear at this point.  No reported trauma. Will CT for possible CVA/bleed, etc. Hypoglycemic after arrival to ED but mental status the same when glucose was normal. D50. Continue to closely monitor. Admit to CCM.     Virgel Manifold, MD 10/15/13 [phone]

## 2013-10-15 NOTE — Progress Notes (Signed)
ANTIBIOTIC CONSULT NOTE - INITIAL  Pharmacy Consult for Unasyn Indication: Aspiration Pneumonia  No Known Allergies  Patient Measurements: Height: 5' 4.5" (163.8 cm) Weight: 115 lb (52.164 kg) IBW/kg (Calculated) : 60.35   Vital Signs: Temp: 95 F (35 C) (09/08 1540) Temp src: Core (Comment) (09/08 1540) BP: 158/80 mmHg (09/08 1540) Pulse Rate: 61 (09/08 1515) Intake/Output from previous day:   Intake/Output from this shift:    Labs:  Recent Labs  10/15/13 1318  WBC 5.5  HGB 6.0*  PLT 91*  CREATININE 1.62*   Estimated Creatinine Clearance: 29.3 ml/min (by C-G formula based on Cr of 1.62). No results found for this basename: VANCOTROUGH, VANCOPEAK, VANCORANDOM, GENTTROUGH, GENTPEAK, GENTRANDOM, TOBRATROUGH, TOBRAPEAK, TOBRARND, AMIKACINPEAK, AMIKACINTROU, AMIKACIN,  in the last 72 hours   Microbiology: No results found for this or any previous visit (from the past 720 hour(s)).  Medical History: Past Medical History  Diagnosis Date  . CHF (congestive heart failure)   . Diabetes mellitus without complication   . HTN (hypertension)   . Pulmonary HTN   . CAD (coronary artery disease)   . CKD (chronic kidney disease)   . Ischemic cardiomyopathy   . Anemia   . Thrombocytopenia     Medications:   (Not in a hospital admission) Scheduled:  . etomidate      . insulin aspart  2-6 Units Subcutaneous 6 times per day  . lidocaine (cardiac) 100 mg/65ml      . pantoprazole (PROTONIX) IV  40 mg Intravenous QHS  . rocuronium      . sodium chloride  3 mL Intravenous Q12H  . succinylcholine       Infusions:  . sodium chloride    . sodium chloride    . vancomycin     Assessment: 63yo female BIBA after being found down. Pt was found in AHRF and intubated. Pharmacy consulted to dose Unasyn for possible aspiration pneumonia and renally adjust all antibiotics. Pt is hypothermic to 95, WBC wnl, sCr 1.62 (unknown baseline), and CrCl ~30 mL/min.  Goal of Therapy:   Resolution of infection  Plan:  Start Unasyn 1.5g IV q12h Follow up culture results, renal function, and clinical course  Andrey Cota. Diona Foley, PharmD Clinical Pharmacist Pager [phone] 10/15/2013,3:43 PM

## 2013-10-15 NOTE — H&P (Signed)
PULMONARY / CRITICAL CARE MEDICINE   Name: [name] [name] MRN: [id number] DOB: 11/10/1950    ADMISSION DATE:  10/15/2013 CONSULTATION DATE:  10/15/2013  REFERRING MD :  EDP  CHIEF COMPLAINT:  [place]  INITIAL PRESENTATION: 63 year old female with DM, CHF, presented to [facility] ED 9/8 after being found down. EMS noted several episodes of apnea and used BVM en route. Intubated in ED where pink frothy secretions and BNP 23k noted. PCCM asked to see for admission.   PMH listed under different med rec number [id number]  STUDIES:  07/02/13 2d Echo LVEF 35-40%, grade 2 DD.  SIGNIFICANT EVENTS: 9/8 intubated, ICU admission  HISTORY OF PRESENT ILLNESS:  Obtained from medical record and son. 48 yo with history of CHF and DM presented to [facility] ED 9/8. Son states increasing edema recently. EMS was called by family after patient was found unresponsive. Unfortunately, language barrier and details of history her unclear. Not sure of when last seen normal. Unknown if trauma, ingestion or state of health just prior to this event. The fire department arrived first and found patient hypoventilating and then became apneic. Assisted with bag-valve-mask until paramedics arrived. Initial blood glucose of 71. She had a pulse and remained withdrawn in route. Normal to hypertensive. Paramedics attempted to intubate once orally and nasotracheally w/o success. They did note a gag and she did briefly open her eyes during attempts, but otherwise was not doing anything more neurologically. Twelve-lead EKG showed a sinus rhythm.In ED she was immediately intubated, pink frothy secretions were noted. Her BNP was found to be 23k, CXR was non-diagnostic. She was also hypoglycemic in ED (37), and was given D50. PCCM asked to admit.    Of note she has a recent admission to [facility] for CHF exacerbation. She did not respond well to diuresis and was eventually started on milrinone. She was found to have AKI due to cardiorenal syndrome. She was  discharged with creat 1.8 on 6/3 and was scheduled to follow up with HF clinic. She has skipped both appointments. It was documented that language and lack of family support are barriers to care.     PAST MEDICAL HISTORY :  Past Medical History  Diagnosis Date  . CHF (congestive heart failure)   . Diabetes mellitus without complication   . HTN (hypertension)   . Pulmonary HTN   . CAD (coronary artery disease)   . CKD (chronic kidney disease)   . Ischemic cardiomyopathy   . Anemia   . Thrombocytopenia    No past surgical history on file. Prior to Admission medications   Not on File   No Known Allergies  FAMILY HISTORY:  No family history on file. SOCIAL HISTORY:  has no tobacco, alcohol, and drug history on file.  REVIEW OF SYSTEMS:  Unable, intubated  SUBJECTIVE:   VITAL SIGNS: Temp:  [95 F (35 C)-95.1 F (35.1 C)] 95.1 F (35.1 C) (09/08 1511) Pulse Rate:  [58-76] 61 (09/08 1515) Resp:  [14-19] 14 (09/08 1515) BP: (159-171)/(75-94) 163/75 mmHg (09/08 1515) SpO2:  [100 %] 100 % (09/08 1515) FiO2 (%):  [100 %] 100 % (09/08 1330) Weight:  [52.164 kg (115 lb)] 52.164 kg (115 lb) (09/08 1319) HEMODYNAMICS:   VENTILATOR SETTINGS: Vent Mode:  [-] PRVC FiO2 (%):  [100 %] 100 % Set Rate:  [14 bmp] 14 bmp Vt Set:  [450 mL] 450 mL PEEP:  [5 cmH20] 5 cmH20 Plateau Pressure:  [20 cmH20] 20 cmH20 INTAKE / OUTPUT: No intake or output  data in the 24 hours ending 10/15/13 1531  PHYSICAL EXAMINATION: General:  Female on vent Neuro:  Obtunded, unresponsive. No sedation on vent.  HEENT:  [place]/AT, about 4 cm JVD noted.  Cardiovascular:  RRR Lungs:  Scant fine crackles throughout. Abdomen:  Soft, non-tender, non-distended Musculoskeletal:  No acute deformity Skin: Intact  LABS:  CBC  Recent Labs Lab 10/15/13 1318  WBC 5.5  HGB 6.0*  HCT 17.8*  PLT 91*   Coag's  Recent Labs Lab 10/15/13 1318  INR 1.27   BMET  Recent Labs Lab 10/15/13 1318  NA 136*  K  3.6*  CL 99  CO2 21  BUN 74*  CREATININE 1.62*  GLUCOSE 34*   Electrolytes  Recent Labs Lab 10/15/13 1318  CALCIUM 8.1*   Sepsis Markers  Recent Labs Lab 10/15/13 1342  LATICACIDVEN 1.0   ABG No results found for this basename: PHART, PCO2ART, PO2ART,  in the last 168 hours Liver Enzymes No results found for this basename: AST, ALT, ALKPHOS, BILITOT, ALBUMIN,  in the last 168 hours Cardiac Enzymes  Recent Labs Lab 10/15/13 1318  TROPONINI <0.30  PROBNP 24313.0*   Glucose  Recent Labs Lab 10/15/13 1332 10/15/13 1405  GLUCAP 37* 138*    Imaging No results found.   ASSESSMENT / PLAN:  PULMONARY OETT 9/8 > A: Acute respiratory failure - likely 2nd to pulmonary edema H/o PAH ? Aspiration pneumonitis  P:   Full vent support Check ABG Follow CXR Lasix 40mg  now and agail at  Pulmonary hygiene Daily SBT  CARDIOVASCULAR A:  Acute on chronic HFrEF (LVEF 35-40% in May) Bradycardia Hypertension  P:  Trend troponin/lactic acid PRN hydralazine as needed to maintain SBP < 169mmHg Diurese as BP tolerates May need CVL Consult cardiology as is followed in HF clinic  RENAL A:   CKD (discharged 6/3 with creat 1.8) Hypokalemia  P:   Hold IVF in setting of CHF exacerbation Follow BMP  GASTROINTESTINAL A:   Possible GIB  P:   Guaiac stool testing NPO SUP: IV PPI Consult GI if hemoccult positive  HEMATOLOGIC A:   Anemia appears likely acute on chronic, unknown etiology > ? GIB  P:  Transfuse 2 units PRBC per EDP Guaiac stool SCDs for VTE ppx Transfuse per ICU guidelines.  Monitor CBC  INFECTIOUS A:   Hypothermia Probable aspiration pneumonitis  P:   BCx2 9/8 >>> UC 9/8 >>> Sputum 9/8 >>> Abx: unasyn, start date 9/8 >>> DC vanc/zosyn Warming blanket  ENDOCRINE A:   Hypoglycemia (37 in ED given 1 amp D50) DM    P:   CBG monitoring SSI protocol TSH  NEUROLOGIC A:   Acute encephalopathy  P:   RASS goal: -1 PRN  fentanyl for sedation CT head r/o hemorrhage Daily W[facility]  Georgann Housekeeper, ACNP [place] [facility] Pager [phone] or [phone]  Attending:  I have seen and examined the patient with nurse practitioner/resident and agree with the note above.   Found down today > looks like pulmonary edema on CXR to me Noted to have LGIB but hemodynamically stable   Plan diuresis GI consult Cardiology consult Monitor CBC  I have personally obtained a history, examined the patient, evaluated laboratory and imaging results, formulated the assessment and plan and placed orders. CRITICAL CARE: The patient is critically ill with multiple organ systems failure and requires high complexity decision making for assessment and support, frequent evaluation and titration of therapies, application of advanced monitoring technologies and extensive interpretation of multiple  databases. Critical Care Time devoted to patient care services described in this note is 40 minutes.   Roselie Awkward, MD [place] [facility] Pager: [phone] Cell: [phone] If no response, call [phone]

## 2013-10-15 NOTE — ED Notes (Signed)
Returned from ct scan , son given update on pt condition

## 2013-10-15 NOTE — Consult Note (Signed)
Cardiology Consultation Note  Patient ID: [name] [name], MRN: [id number], DOB/AGE: 64-Jun-1952 64 y.o. Admit date: 10/15/2013   Date of Consult: 10/15/2013 Primary Physician: No primary provider on file. Primary Cardiologist: Bensimhon  Chief Complaint: unresponsive Reason for Consult: h/o CHF Note: the patient has 2 [place]. The chart is marked for merging.  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 Per outpatient notes she has had a poor social situation, missing outpatient appointments. It was documented that language and lack of family support are barriers to care. Her son hadn't talked to her on the phone in 2-3 days. He says she did report feeling weak the last 3 days. Per report when he went to her house she was unresponsive. 911 was called. The fire department arrived first and found patient hypoventilating and then became apneic. They assisted with bag-valve-mask until paramedics arrived. Initial blood glucose of 71. She had a pulse and remained withdrawn in route, normo- to hypertensive. Paramedics attempted to intubate once orally and nasotracheally w/o success. They did note a gag and she did briefly open her eyes during attempts, but otherwise was not doing anything more neurologically. Twelve-lead EKG showed NSR. In ED she was immediately intubated, pink frothy secretions were noted. She was also noted to have rectal bleeding. Notable findings in the ED include glucose down to 34, hypothermic at 95, +FOBT and Hgb down to 6.0 (previously mid 8 range in 06/2013, ~11ish in 02/2013). CT head without AIC  pathololgy; + high density fluid in posterior nasal passage concerning for blood products. CXR no acute CP disease. BUN/Cr 74/1.62, Na 136, K 2.6, pBNP 24k, troponin neg x1 , lactic acid 1. She is starting to wake up a little. UA negative. Blood cx drawn. PT/INR 15.9/1.27. It is not clear if she has been taking her medicines at home.   Son's phone number - [phone]  D/c med list from 06/2013 (she has not come to followup since that time)  STOP taking these medications  furosemide 40 MG tablet   Commonly known as: LASIX   sodium bicarbonate 650 MG tablet    TAKE these medications  ANTI-DIARRHEAL PO   Take 2 tablets by mouth daily as needed (diarrhea).    aspirin 81 MG EC tablet   Take 1 tablet (81 mg total) by mouth daily.    atorvastatin 20 MG tablet   Commonly known as: LIPITOR   Take 1 tablet (20 mg total) by mouth daily at 6 PM.    carvedilol 3.125 MG tablet   Commonly known as: COREG   Take 1 tablet (3.125 mg total) by mouth 2 (two) times daily with a meal.    glipiZIDE 2.5 MG 24 hr tablet   Commonly known as: GLUCOTROL XL   Take 1 tablet (2.5 mg total) by mouth daily with breakfast.    hydrALAZINE 100 MG tablet   Commonly known as: APRESOLINE   Take 1 tablet (100 mg total) by mouth 3 (three) times daily.    isosorbide mononitrate 60 MG 24 hr tablet   Commonly known as: IMDUR   Take 60 mg by mouth 2 (two) times daily.  potassium chloride SA 20 MEQ tablet   Commonly known as: K-DUR,KLOR-CON   Take 1 tablet (20 mEq total) by mouth daily.    torsemide 20 MG tablet   Commonly known as: DEMADEX   Take 2 tablets (40 mg total) by mouth 2 (two) times daily.    Past Medical History  Diagnosis Date  . Chronic systolic CHF (congestive heart failure)     a. EF 35-40% grade II DD 02/2013. b. Previously required short-term milrinone.  Marland Kitchen Uncontrolled diabetes mellitus   . HTN (hypertension)   . Pulmonary HTN     a. Cath 02/2013: severe pulm HTN.  . CAD (coronary  artery disease)     a. Cath 02/2013: severe 2V CAD - 100% mRCA with L-R collaterals; diffuse 80-95% subtotal OM2 (too diffusely diseased to attempt PCA), overall small caliber diffusely diseased coronaries c/w poorly controlled DM; treated medically.  . CKD (chronic kidney disease), stage IV   . Ischemic cardiomyopathy   . Anemia     a. Prior normal EGD 03/2013. b. Progressive anemia 06/2013 in setting of transient hematemesis that had resolved.   . Thrombocytopenia     a. Liver imaging normal 03/2013.  . Edema     a. RUE edema with neg duplex for DVT 06/2013.  Marland Kitchen Protein calorie malnutrition   . Poor social situation   . Ascites 03/2013      Most Recent Cardiac Studies: 2D Echo 06/2013 - Left ventricle: The cavity size was normal. Wall thickness was normal. Systolic function was moderately reduced. The estimated ejection fraction was in the range of 35% to 40%. Severe hypokinesis of the inferior myocardium. Features are consistent with a pseudonormal left ventricular filling pattern, with concomitant abnormal relaxation and increased filling pressure (grade 2 diastolic dysfunction). - Aortic valve: There was mild regurgitation. - Mitral valve: There was moderate regurgitation. - Left atrium: The atrium was mildly dilated. - Right ventricle: Systolic function was mildly to moderately reduced. - Right atrium: The atrium was mildly dilated. - Tricuspid valve: There was moderate regurgitation. - Pulmonic valve: There was moderate regurgitation.    Surgical History: No past surgical history on file.   Home Meds: Prior to Admission medications   Medication Sig Start Date End Date Taking? Authorizing Provider  ATORVASTATIN CALCIUM PO Take 1 tablet by mouth daily.   Yes Historical Provider, MD  carvedilol (COREG) 3.125 MG tablet Take 3.125 mg by mouth daily.   Yes Historical Provider, MD  Furosemide (LASIX PO) Take 1 tablet by mouth daily.   Yes Historical Provider, MD  glipiZIDE  (GLUCOTROL XL) 2.5 MG 24 hr tablet Take 2.5 mg by mouth daily with breakfast.   Yes Historical Provider, MD  HYDRALAZINE HCL PO Take 1 tablet by mouth 3 (three) times daily.   Yes Historical Provider, MD  isosorbide mononitrate (IMDUR) 60 MG 24 hr tablet Take 60 mg by mouth 2 (two) times daily.   Yes Historical Provider, MD  potassium chloride SA (K-DUR,KLOR-CON) 20 MEQ tablet Take 20 mEq by mouth daily.   Yes Historical Provider, MD    Inpatient Medications:  . [START ON 10/16/2013] antiseptic oral rinse  7 mL Mouth Rinse QID  . chlorhexidine  15 mL Mouth Rinse BID  . etomidate      . furosemide  40 mg Intravenous Once  . furosemide  40 mg Intravenous Once  . insulin aspart  2-6 Units Subcutaneous 6 times per day  . lidocaine (cardiac) 100 mg/73ml      .  pantoprazole (PROTONIX) IV  40 mg Intravenous QHS  . rocuronium      . sodium chloride  3 mL Intravenous Q12H  . succinylcholine       . sodium chloride    . sodium chloride    . ampicillin-sulbactam (UNASYN) IV    . vancomycin 1,000 mg (10/15/13 1617)    Allergies: No Known Allergies  History   Social History  . Marital Status: Married    Spouse Name: N/A    Number of Children: N/A  . Years of Education: N/A   Occupational History  . Not on file.   Social History Main Topics  . Smoking status: Never Smoker   . Smokeless tobacco: Not on file  . Alcohol Use: Not on file  . Drug Use: Not on file  . Sexual Activity: Not on file   Other Topics Concern  . Not on file   Social History Narrative  . No narrative on file     No family history on file. - unable to obtain 2/2 intubation/sedation  Review of Systems:- unable to obtain 2/2 intubation/sedation  Labs:  Recent Labs  10/15/13 1318  TROPONINI <0.30   Lab Results  Component Value Date   WBC 5.5 10/15/2013   HGB 6.0* 10/15/2013   HCT 17.8* 10/15/2013   MCV 89.9 10/15/2013   PLT 91* 10/15/2013    Recent Labs Lab 10/15/13 1318  NA 136*  K 3.6*  CL 99  CO2 21   BUN 74*  CREATININE 1.62*  CALCIUM 8.1*  GLUCOSE 34*   Radiology/Studies:  Ct Head Wo Contrast  10/15/2013   CLINICAL DATA:  Respiratory arrest, altered mental status  EXAM: CT HEAD WITHOUT CONTRAST  TECHNIQUE: Contiguous axial images were obtained from the base of the skull through the vertex without intravenous contrast.  COMPARISON:  None.  FINDINGS: There is no evidence of mass effect, midline shift, or extra-axial fluid collections. There is no evidence of a space-occupying lesion or intracranial hemorrhage. There is no evidence of a cortical-based area of acute infarction. There is generalized cerebral atrophy. There is periventricular white matter low attenuation likely secondary to microangiopathy.  The ventricles and sulci are appropriate for the patient's age. The basal cisterns are patent.  Visualized portions of the orbits are unremarkable. There is a small amount of fluid in the ethmoid sinuses posteriorly. There is a small amount of fluid in the sphenoid sinus. There is high-density fluid in the posterior nasal passage concerning for blood products. Cerebrovascular atherosclerotic calcifications are noted.  The osseous structures are unremarkable.  IMPRESSION: 1. No acute intracranial pathology. 2. There is high-density fluid in the posterior nasal passage concerning for blood products.   Electronically Signed   By: Kathreen Devoid   On: 10/15/2013 15:02   Dg Chest Portable 1 View  10/15/2013   CLINICAL DATA:  Vaginal bleeding.  Unresponsive.  EXAM: PORTABLE CHEST - 1 VIEW  COMPARISON:  None.  FINDINGS: Endotracheal tube tip lies 2.4 cm above the chronic. Nasogastric tube passes below the diaphragm well into the stomach.  Cardiac silhouette is mildly enlarged. No mediastinal or hilar masses. There are prominent bronchovascular markings. No lung consolidation or edema. No pleural effusion or gross pneumothorax on this supine study.  Bony thorax is demineralized but intact.  IMPRESSION: 1.  Endotracheal tube and nasogastric tube are well positioned. 2. No acute cardiopulmonary disease.   Electronically Signed   By: Lajean Manes M.D.   On: 10/15/2013 13:47  EKG: NSR 62bpm 1st degree AVB nonspecific IVCD, Q waves V1-V2, nonspecific ST-T changes otherwise, not significantly changed from prior tracing (except prior EKG was lower voltage)  Physical Exam:  Blood pressure 161/73, pulse 60, temperature 95 F (35 C), temperature source Core (Comment), resp. rate 14, height 5' 4.5" (1.638 m), weight 115 lb (52.164 kg), SpO2 100.00%. General: Thin asian female in no acute distress. Appears older than stated age Head: Normocephalic, atraumatic, sclera non-icteric, no xanthomas, nares are without discharge.  Neck:  JVD up to jaw Lungs: Intubated. Coarse BS throughout on vent without wheezes or rhonchi. Breathing is unlabored. Heart: RRR with S1 S2. No murmurs, rubs, or gallops appreciated. Abdomen: Somewhat distended with normoactive bowel sounds. No hepatomegaly. Mild guarding L abdomen. No obvious abdominal masses. Msk:  Muscle tone appears decreased for age Extremities: No clubbing or cyanosis. No edema.  Distal pedal pulses are 2+ and equal bilaterally. Neuro: Intubated. Arousable but still obtunded   Assessment and Plan:  63 y/o F with h/o 2V CAD rx'd medically, chronic systolic CHF EF 37-16% felt 2/2 severe pulm HTN, HTN, CKD stage IV, anemia (normal EGD 03/2013 but declining Hgb 06/2013), noncompliance with outpatient followup and reported poor social situation, ascites 03/2013 s/p para, admitted after being found unresponsive. W/u includes worsening anemia with Hgb 6 and positive FOBT, pBNP 24k, hypothermia and hypoglycemia.  1. Acute respiratory failure with possible aspiration pneumonitis 2. Acute encephalopathy 3. Hypothermia 4. Progressive acute on chronic anemia with GIB (+FOBT), will receive 2 U PRBC; chronic thrombocytopenia 5. Chronic systolic CHF 6. Severe pulmonary HTN  with PVR ~ 3.0 7. HTN 8. CKD stage IV 9. Hypoglycemia 10. Ascities 03/2013 s/p paracentesis at that time  Patient seen and examined with Dr. Haroldine Laws. See below for comprehensive thoughts. He recommends central access to be able to follow CVPs and co-ox levels. Agree with IV Lasix (written for 40mg  now and repeat at 2200) particularly in the setting of receiving blood transfusion. GI is also seeing the patient. F/u 2D echocardiogram. Ideally would begin to restart CHF meds as we are able pending stability of clinical situation. Will follow.   Signed, Melina Copa PA-C 10/15/2013, 4:20 PM   Patient seen and examined with Melina Copa, PA-C. We discussed all aspects of the encounter. I agree with the assessment and plan as stated above.   I know Ms. [name] [name] well from several previous hospitalizations. She has had severe systolic HF requiring milrinone in the past. On RHC in January her pulmonary pressures were in the 70-80 range with PCWP in the mid 30s. PVR was around 3 WU (mostly pulmonary venous HTN).She has been unable to f/u in clinic on a routine basis.  The etiology of her current decompensation is unclear. On exam she has some mild abdominal distension and her neck veins are mildly elevated but she does not have any LE edema or other indicators of poor perfusion. The most notable finding on her labs is a Hgb 6.0 and I suspect she may have had a recent bleed. I do not think this is dilutional from her HF.   At this point, agree with obtaining central access and following CVPs and co-ox. Would also repeat her echo. She is scheduled to get blood today and will get IV lasix between units. Will continue with supportive care and await more data.  Daniel Bensimhon,MD  Addendum:  Co-ox 69.7%. CVP 20. Suggestive of good cardiac output with volume overload. Agree with diuresis. Check echo. May need repeat RHC  prior to d/c.   Daniel Bensimhon,MD 11:10 PM

## 2013-10-15 NOTE — Procedures (Signed)
Central Venous Catheter Insertion Procedure Note Dewanna Hurston [id number] 1950/05/25  Procedure: Insertion of Central Venous Catheter Indications: Assessment of intravascular volume and Frequent blood sampling  Procedure Details Consent: Risks of procedure as well as the alternatives and risks of each were explained to the (patient/caregiver).  Consent for procedure obtained. Time Out: Verified patient identification, verified procedure, site/side was marked, verified correct patient position, special equipment/implants available, medications/allergies/relevent history reviewed, required imaging and test results available.  Performed  Maximum sterile technique was used including antiseptics, cap, gloves, gown, hand hygiene, mask and sheet. Skin prep: Chlorhexidine; local anesthetic administered A antimicrobial bonded/coated triple lumen catheter was placed in the left internal jugular vein using the Seldinger technique. Ultrasound guidance used.Yes.   Catheter placed to 18 cm. Blood aspirated via all 3 ports and then flushed x 3. Line sutured x 3 and dressing applied.  Evaluation Blood flow good Complications: No apparent complications Patient did tolerate procedure well. Chest X-ray ordered to verify placement.  CXR: pending.  Georgann Housekeeper, ACNP [facility] Pager [phone] or [phone]     Attending:  I supervised the procedure  Roselie Awkward, MD [facility] Pager: [phone] Cell: [phone] If no response, call [phone]

## 2013-10-15 NOTE — ED Notes (Signed)
16 FR Catheter placed at 1358. Urine returned.

## 2013-10-15 NOTE — ED Notes (Signed)
Pt placed on blanket warmer, due to  A rectal temp of 95.0, MD notified

## 2013-10-15 NOTE — Progress Notes (Signed)
Chaplain responded to page, after checking in at nurses station, met patient's son in waiting area and escorted him back to consult room while informing him of chaplain's identity.  Chaplain then checked in on patient and informed medical staff in patient room where they could find patient's son.  Dr. Informed patient's son of patient's status while chaplain remained present.  Offered emotional support and advocated for patient and family with the medical staff.  Will follow up as needed.  10/15/13 1400  Clinical Encounter Type  Visited With Family  Visit Type Initial;Spiritual support;ED;Trauma  Consult/Referral To Nurse;Physician  Spiritual Encounters  Spiritual Needs Emotional  Stress Factors  Patient Stress Factors None identified  Family Stress Factors Exhausted;Family relationships;Health changes;Lack of caregivers   Geralyn Flash

## 2013-10-15 NOTE — ED Notes (Signed)
Per EMS - pt found at home by family unresponsive. Unknown of last seen normal. Hx of CHF and DM. CBG 71. EMS started bagging the pt with BVM. 100% O2 sats with that. BP 160/90, HR 70 NSR. 12 lead unremarkable. Attempted oral intubation but pt had a gag reflux so they continued BVM. Pink frothy fluid came out, ems suctioned pt approx 10 ml of fluid. Never lost pulses but became apneic multiple times. No meds given en route.

## 2013-10-15 NOTE — Consult Note (Signed)
[facility] Consult: 4:24 PM 10/15/2013  LOS: 0 days    Referring Provider: dr Lake Bells  Primary Care Physician: Dr Doreene Burke  Primary Gastroenterologist:  Dr. Oletta Lamas in 03/2013.     Reason for Consultation:  Hematochezia.    HPI: [name] [name] is a 63 y.o. female.  Pt has 2 different med rec #s. Does not speak english, only cambodian.   History of severe 2V CAD 02/2013 tx medically, severe pulmonary on cath,  HTN, chronic systolic CHF: EF 32-99% (previously requiring milrinone), HTN, DM, CKD stage IV, anemia, normal EGD 03/2013, progressive anemia 06/2013 with transient blood-streaked vomiting and not endoscoped , protein calorie malnutrition, and ascites 03/2013 but liver normal on CT.  Anasarca in 06/2013.  Thrombocytopenia to 69 in 06/2013.    EGD in 03/2013 by dr Oletta Lamas for epig pain. This was normal.  Had ascites but normal liver on CT 03/2013 MRI/MRA of 03/2013 showed: IMPRESSION:  This study is limited secondary to patient motion.  There is 50% narrowing in the proximal SMA and this is not likely to be significant.  There is also narrowing at the origin of the celiac axis, however its origin was obscured.  The IMA was obscured. It is either occluded or was not clearly visualized on this study. HIDA scan in 02/2013 was negative.   Comes in today after found down at home.  EMS on the scene attempted oral and nasotracheal intubation but unsuccesful.  Pink frothy secretions noted at ETT placement in ED.  Last seen by son 3 days ago and c/o fatigue then.  In acute pulm edema and [place] [id number].   On rectal exam had hematochezia. She is not anemic. Hgb is 6.0, Platelets 91, glucose is 34. + AKI.  No LFTs yet obtained. coags not prolonged.  Unknown if taking her meds.       Past Medical History    Diagnosis Date  . Chronic systolic CHF (congestive heart failure)     a. EF 35-40% grade II DD 02/2013. b. Previously required short-term milrinone.  Marland Kitchen Uncontrolled diabetes mellitus   . HTN (hypertension)   . Pulmonary HTN     a. Cath 02/2013: severe pulm HTN.  . CAD (coronary artery disease)     a. Cath 02/2013: severe 2V CAD - 100% mRCA with L-R collaterals; diffuse 80-95% subtotal OM2 (too diffusely diseased to attempt PCA), overall small caliber diffusely diseased coronaries c/w poorly controlled DM; treated medically.  . CKD (chronic kidney disease), stage IV   . Ischemic cardiomyopathy   . Anemia     a. Prior normal EGD 03/2013. b. Progressive anemia 06/2013 in setting of transient hematemesis that had resolved.   . Thrombocytopenia     a. Liver imaging normal 03/2013.  . Edema     a. RUE edema with neg duplex for DVT 06/2013.  Marland Kitchen Protein calorie malnutrition   . Poor social situation   . Ascites 03/2013    No past surgical history on file.  Prior to Admission medications   Medication Sig Start  Date End Date Taking? Authorizing Provider  ATORVASTATIN CALCIUM PO Take 1 tablet by mouth daily.   Yes Historical Provider, MD  carvedilol (COREG) 3.125 MG tablet Take 3.125 mg by mouth daily.   Yes Historical Provider, MD  Furosemide (LASIX PO) Take 1 tablet by mouth daily.   Yes Historical Provider, MD  glipiZIDE (GLUCOTROL XL) 2.5 MG 24 hr tablet Take 2.5 mg by mouth daily with breakfast.   Yes Historical Provider, MD  HYDRALAZINE HCL PO Take 1 tablet by mouth 3 (three) times daily.   Yes Historical Provider, MD  isosorbide mononitrate (IMDUR) 60 MG 24 hr tablet Take 60 mg by mouth 2 (two) times daily.   Yes Historical Provider, MD  potassium chloride SA (K-DUR,KLOR-CON) 20 MEQ tablet Take 20 mEq by mouth daily.   Yes Historical Provider, MD    Scheduled Meds: . [START ON 10/16/2013] antiseptic oral rinse  7 mL Mouth Rinse QID  . chlorhexidine  15 mL Mouth Rinse BID  . etomidate      .  furosemide  40 mg Intravenous Once  . furosemide  40 mg Intravenous Once  . insulin aspart  2-6 Units Subcutaneous 6 times per day  . lidocaine (cardiac) 100 mg/70ml      . pantoprazole (PROTONIX) IV  40 mg Intravenous QHS  . rocuronium      . sodium chloride  3 mL Intravenous Q12H  . succinylcholine       Infusions: . sodium chloride    . sodium chloride    . ampicillin-sulbactam (UNASYN) IV    . vancomycin 1,000 mg (10/15/13 1617)   PRN Meds: sodium chloride, sodium chloride, albuterol, etomidate, fentaNYL, fentaNYL, rocuronium, sodium chloride   Allergies as of 10/15/2013  . (No Known Allergies)    No family history on file.  History   Social History  . Marital Status: Married    Spouse Name: N/A    Number of Children: N/A  . Years of Education: N/A   Occupational History  . Not on file.   Social History Main Topics  . Smoking status: Never Smoker   . Smokeless tobacco: Not on file  . Alcohol Use: Not on file  . Drug Use: Not on file  . Sexual Activity: Not on file   Other Topics Concern  . Not on file   Social History Narrative  . No narrative on file    REVIEW OF SYSTEMS: Constitutional:  Unable to obtain.  + fatigue 3 days ago ENT:  Unable to obtain Pulm: per HPI CV:  No palpitations, no LE edema.  GU:  No hematuria, no frequency, no bloody urine reported GI:  Per HPI.   Heme:  Bleeding now from mouth and rectum   Transfusions:  Not known Neuro: ? Derm:  ? Endocrine:  ? Immunization: ? Travel:  ?   PHYSICAL EXAM: Vital signs in last 24 hours: Filed Vitals:   10/15/13 1600  BP: 161/73  Pulse: 60  Temp:   Resp: 14   Wt Readings from Last 3 Encounters:  10/15/13 52.164 kg (115 lb)    General: looks ill and old for age.  Malnourished.  Head:  No trauma  Eyes:  No icterus or pallor Ears:  No obvious HOH  Nose:  No discharge Mouth:  Front of mouth without red blood.  Oral suction tube is sucking red blood from back of mouth/throat.   Tongue with some  Discolorations but no sores Neck:  No mass.  +JVD Lungs:  Crackles throughout Heart: RRR.  No mrg Abdomen:  Distended, soft, BS hypoactive, not tender.  No HSM.   Rectal: deferred.  Red blood per staff.    Musc/Skeltl: no joint contracture Extremities:  No CCE  Neurologic:  Opens eyes to voice.  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    Psych:  Not agitated.   Intake/Output from previous day:   Intake/Output this shift: Total I/O In: 0  Out: 700 [Urine:700]  LAB RESULTS:  Recent Labs  10/15/13 1318  WBC 5.5  HGB 6.0*  HCT 17.8*  PLT 91*   BMET Lab Results  Component Value Date   NA 136* 10/15/2013   K 3.6* 10/15/2013   CL 99 10/15/2013   CO2 21 10/15/2013   GLUCOSE 34* 10/15/2013   BUN 74* 10/15/2013   CREATININE 1.62* 10/15/2013   CALCIUM 8.1* 10/15/2013   LFT No results found for this basename: PROT, ALBUMIN, AST, ALT, ALKPHOS, BILITOT, BILIDIR, IBILI,  in the last 72 hours PT/INR Lab Results  Component Value Date   INR 1.27 10/15/2013   Hepatitis Panel No results found for this basename: HEPBSAG, HCVAB, HEPAIGM, HEPBIGM,  in the last 72 hours C-Diff No components found with this basename: cdiff   Lipase  No results found for this basename: lipase    Drugs of Abuse  No results found for this basename: labopia, cocainscrnur, labbenz, amphetmu, thcu, labbarb     RADIOLOGY STUDIES: Ct Head Wo Contrast  10/15/2013   CLINICAL DATA:  Respiratory arrest, altered mental status  EXAM: CT HEAD WITHOUT CONTRAST  TECHNIQUE: Contiguous axial images were obtained from the base of the skull through the vertex without intravenous contrast.  COMPARISON:  None.  FINDINGS: There is no evidence of mass effect, midline shift, or extra-axial fluid collections. There is no evidence of a space-occupying lesion or intracranial hemorrhage. There is no evidence of a cortical-based area of acute  infarction. There is generalized cerebral atrophy. There is periventricular white matter low attenuation likely secondary to microangiopathy.  The ventricles and sulci are appropriate for the patient's age. The basal cisterns are patent.  Visualized portions of the orbits are unremarkable. There is a small amount of fluid in the ethmoid sinuses posteriorly. There is a small amount of fluid in the sphenoid sinus. There is high-density fluid in the posterior nasal passage concerning for blood products. Cerebrovascular atherosclerotic calcifications are noted.  The osseous structures are unremarkable.  IMPRESSION: 1. No acute intracranial pathology. 2. There is high-density fluid in the posterior nasal passage concerning for blood products.   Electronically Signed   By: Kathreen Devoid   On: 10/15/2013 15:02   Dg Chest Portable 1 View  10/15/2013   CLINICAL DATA:  Vaginal bleeding.  Unresponsive.  EXAM: PORTABLE CHEST - 1 VIEW  COMPARISON:  None.  FINDINGS: Endotracheal tube tip lies 2.4 cm above the chronic. Nasogastric tube passes below the diaphragm well into the stomach.  Cardiac silhouette is mildly enlarged. No mediastinal or hilar masses. There are prominent bronchovascular markings. No lung consolidation or edema. No pleural effusion or gross pneumothorax on this supine study.  Bony thorax is demineralized but intact.  IMPRESSION: 1. Endotracheal tube and nasogastric tube are well positioned. 2. No acute cardiopulmonary disease.   Electronically Signed   By: Lajean Manes M.D.   On: 10/15/2013 13:47    ENDOSCOPIC STUDIES: 03/2013 EGD  Dr Oletta Lamas INDICATIONS: abdominal pain CT  and MRI failing to reveal calls.  Patient does have an acitic fluid she said recent renal  insufficiency and liver grossly normal un contrasted CT  ENDOSCOPIC IMPRESSION:  1. Epigastric Abdominal Pain. No explanation found on EGD. There  were no signs of esophageal varices.   IMPRESSION:   *  Hematochezia.  Unclear if having  abdominal pain, though none on exam today.  EGD in 03/2012 was normal.  No colonoscopy on records.   *  Blood per tube in back of throat. This is from intubation associated trauma.   *  Hx ascites. Likely cardiac or malnutrition related as no imaging evidence of liver disease. Anasarca in 06/2013    *  CHF, acute.  resp failure.  Intubated.   *  AKI.      PLAN:     *  Per Dr. Dickey Gave  10/15/2013, 4:24 PM Pager: [phone]  GI ATTENDING  History, laboratories, x-rays reviewed. Patient seen and examined. Very complicated 63 year old with multiple significant medical problems as outlined. Admitted with respiratory failure secondary to congestive heart failure. Has chronic anemia and thrombocytopenia. Also, minor rectal bleeding. Exam unrevealing. Decreased hemoglobin from baseline likely a combination of dilution from CHF, chronic disease, possibly GI blood loss. At this point, the priority is management of acute cardiopulmonary disease. If severe bleeding were to develop, could consider endoscopic evaluation. However, for now, supportive care. We will follow. Discussed with critical care PA. Thank you  Docia Chuck. Geri Seminole., M.D. [facility] of Gastroenterology

## 2013-10-16 ENCOUNTER — Inpatient Hospital Stay (HOSPITAL_COMMUNITY): Payer: Medicaid Other

## 2013-10-16 DIAGNOSIS — J811 Chronic pulmonary edema: Secondary | ICD-10-CM | POA: Diagnosis present

## 2013-10-16 DIAGNOSIS — I5043 Acute on chronic combined systolic (congestive) and diastolic (congestive) heart failure: Secondary | ICD-10-CM

## 2013-10-16 DIAGNOSIS — I509 Heart failure, unspecified: Secondary | ICD-10-CM

## 2013-10-16 DIAGNOSIS — I2789 Other specified pulmonary heart diseases: Secondary | ICD-10-CM

## 2013-10-16 DIAGNOSIS — J810 Acute pulmonary edema: Secondary | ICD-10-CM

## 2013-10-16 DIAGNOSIS — I059 Rheumatic mitral valve disease, unspecified: Secondary | ICD-10-CM

## 2013-10-16 LAB — TYPE AND SCREEN
ABO/RH(D): B POS
Antibody Screen: NEGATIVE
Unit division: 0
Unit division: 0

## 2013-10-16 LAB — BASIC METABOLIC PANEL
Anion gap: 15 (ref 5–15)
Anion gap: 15 (ref 5–15)
BUN: 73 mg/dL — AB (ref 6–23)
BUN: 73 mg/dL — AB (ref 6–23)
CALCIUM: 8.2 mg/dL — AB (ref 8.4–10.5)
CHLORIDE: 102 meq/L (ref 96–112)
CO2: 22 mEq/L (ref 19–32)
CO2: 22 meq/L (ref 19–32)
CREATININE: 1.7 mg/dL — AB (ref 0.50–1.10)
Calcium: 8.1 mg/dL — ABNORMAL LOW (ref 8.4–10.5)
Chloride: 101 mEq/L (ref 96–112)
Creatinine, Ser: 1.73 mg/dL — ABNORMAL HIGH (ref 0.50–1.10)
GFR calc Af Amer: 35 mL/min — ABNORMAL LOW (ref >90)
GFR calc Af Amer: 36 mL/min — ABNORMAL LOW (ref >90)
GFR calc non Af Amer: 31 mL/min — ABNORMAL LOW (ref >90)
GFR, EST NON AFRICAN AMERICAN: 30 mL/min — AB (ref >90)
GLUCOSE: 70 mg/dL (ref 70–99)
Glucose, Bld: 82 mg/dL (ref 70–99)
POTASSIUM: 4 meq/L (ref 3.7–5.3)
POTASSIUM: 4 meq/L (ref 3.7–5.3)
Sodium: 138 mEq/L (ref 137–147)
Sodium: 139 mEq/L (ref 137–147)

## 2013-10-16 LAB — GLUCOSE, CAPILLARY
GLUCOSE-CAPILLARY: 76 mg/dL (ref 70–99)
GLUCOSE-CAPILLARY: 80 mg/dL (ref 70–99)
GLUCOSE-CAPILLARY: 81 mg/dL (ref 70–99)
Glucose-Capillary: 83 mg/dL (ref 70–99)
Glucose-Capillary: 91 mg/dL (ref 70–99)
Glucose-Capillary: 98 mg/dL (ref 70–99)

## 2013-10-16 LAB — CBC
HCT: 34 % — ABNORMAL LOW (ref 36.0–46.0)
Hemoglobin: 11.6 g/dL — ABNORMAL LOW (ref 12.0–15.0)
MCH: 30.4 pg (ref 26.0–34.0)
MCHC: 34.1 g/dL (ref 30.0–36.0)
MCV: 89 fL (ref 78.0–100.0)
Platelets: 63 10*3/uL — ABNORMAL LOW (ref 150–400)
RBC: 3.82 MIL/uL — ABNORMAL LOW (ref 3.87–5.11)
RDW: 16.7 % — AB (ref 11.5–15.5)
WBC: 7.1 10*3/uL (ref 4.0–10.5)

## 2013-10-16 LAB — TROPONIN I: Troponin I: <0.3 ng/mL (ref <0.30)

## 2013-10-16 MED ORDER — FUROSEMIDE 10 MG/ML IJ SOLN
20.0000 mg | Freq: Three times a day (TID) | INTRAMUSCULAR | Status: DC
  Administered 2013-10-16: 20 mg via INTRAVENOUS
  Filled 2013-10-16: qty 2

## 2013-10-16 MED ORDER — HYDRALAZINE HCL 20 MG/ML IJ SOLN
10.0000 mg | Freq: Four times a day (QID) | INTRAMUSCULAR | Status: DC
  Administered 2013-10-16: 10 mg via INTRAVENOUS
  Filled 2013-10-16: qty 0.5

## 2013-10-16 MED ORDER — FAMOTIDINE IN NACL 20-0.9 MG/50ML-% IV SOLN
20.0000 mg | INTRAVENOUS | Status: DC
  Administered 2013-10-16: 20 mg via INTRAVENOUS
  Filled 2013-10-16: qty 50

## 2013-10-16 MED ORDER — POTASSIUM CHLORIDE 20 MEQ/15ML (10%) PO LIQD
20.0000 meq | Freq: Once | ORAL | Status: AC
  Administered 2013-10-16: 20 meq
  Filled 2013-10-16: qty 15

## 2013-10-16 MED ORDER — FUROSEMIDE 10 MG/ML IJ SOLN
40.0000 mg | Freq: Three times a day (TID) | INTRAMUSCULAR | Status: DC
Start: 2013-10-16 — End: 2013-10-16
  Administered 2013-10-16: 40 mg via INTRAVENOUS
  Filled 2013-10-16: qty 4

## 2013-10-16 MED ORDER — "THROMBI-PAD 3""X3"" EX PADS"
1.0000 | MEDICATED_PAD | Freq: Once | CUTANEOUS | Status: AC
  Administered 2013-10-16: 1 via TOPICAL
  Filled 2013-10-16: qty 1

## 2013-10-16 MED ORDER — ATORVASTATIN CALCIUM 10 MG PO TABS
10.0000 mg | ORAL_TABLET | Freq: Every day | ORAL | Status: DC
  Administered 2013-10-16 – 2013-10-20 (×5): 10 mg via ORAL
  Filled 2013-10-16 (×6): qty 1

## 2013-10-16 MED ORDER — HYDRALAZINE HCL 25 MG PO TABS
25.0000 mg | ORAL_TABLET | Freq: Three times a day (TID) | ORAL | Status: DC
  Administered 2013-10-16: 25 mg via ORAL
  Filled 2013-10-16 (×4): qty 1

## 2013-10-16 MED ORDER — PNEUMOCOCCAL VAC POLYVALENT 25 MCG/0.5ML IJ INJ
0.5000 mL | INJECTION | INTRAMUSCULAR | Status: AC
  Administered 2013-10-17: 0.5 mL via INTRAMUSCULAR
  Filled 2013-10-16: qty 0.5

## 2013-10-16 MED ORDER — FUROSEMIDE 10 MG/ML IJ SOLN
20.0000 mg | Freq: Three times a day (TID) | INTRAMUSCULAR | Status: DC
  Administered 2013-10-17: 20 mg via INTRAVENOUS
  Filled 2013-10-16 (×4): qty 2

## 2013-10-16 MED ORDER — ISOSORBIDE DINITRATE 10 MG PO TABS
10.0000 mg | ORAL_TABLET | Freq: Three times a day (TID) | ORAL | Status: DC
  Administered 2013-10-16 (×2): 10 mg via ORAL
  Filled 2013-10-16 (×5): qty 1

## 2013-10-16 MED ORDER — ISOSORBIDE DINITRATE 10 MG PO TABS
10.0000 mg | ORAL_TABLET | Freq: Three times a day (TID) | ORAL | Status: DC
Start: 2013-10-16 — End: 2013-10-16
  Filled 2013-10-16 (×2): qty 1

## 2013-10-16 MED ORDER — ISOSORBIDE DINITRATE 10 MG PO TABS
10.0000 mg | ORAL_TABLET | Freq: Three times a day (TID) | ORAL | Status: DC
  Administered 2013-10-16: 10 mg via ORAL
  Filled 2013-10-16 (×4): qty 1

## 2013-10-16 MED ORDER — HYDRALAZINE HCL 20 MG/ML IJ SOLN
10.0000 mg | Freq: Four times a day (QID) | INTRAMUSCULAR | Status: DC
  Administered 2013-10-17 (×2): 10 mg via INTRAVENOUS
  Filled 2013-10-16 (×2): qty 0.5
  Filled 2013-10-16: qty 1
  Filled 2013-10-16 (×2): qty 0.5
  Filled 2013-10-16: qty 1

## 2013-10-16 MED ORDER — INFLUENZA VAC SPLIT QUAD 0.5 ML IM SUSY
0.5000 mL | PREFILLED_SYRINGE | INTRAMUSCULAR | Status: AC
  Administered 2013-10-17: 0.5 mL via INTRAMUSCULAR
  Filled 2013-10-16: qty 0.5

## 2013-10-16 NOTE — Progress Notes (Addendum)
Patient ID: [name] [name], female   DOB: March 28, 1950, 63 y.o.   MRN: [id number]   SUBJECTIVE: Transfused yesterday, hemoglobin up to 11.6 today.  Seen by GI yesterday, supportive care for now.  CVL placed, CVP 20 last night with preserved co-ox.  Not getting diuretics currently.   Scheduled Meds: . ampicillin-sulbactam (UNASYN) IV  1.5 g Intravenous Q12H  . antiseptic oral rinse  7 mL Mouth Rinse QID  . chlorhexidine  15 mL Mouth Rinse BID  . [START ON 10/17/2013] Influenza vac split quadrivalent PF  0.5 mL Intramuscular Tomorrow-1000  . insulin aspart  2-6 Units Subcutaneous 6 times per day  . pantoprazole (PROTONIX) IV  40 mg Intravenous QHS  . [START ON 10/17/2013] pneumococcal 23 valent vaccine  0.5 mL Intramuscular Tomorrow-1000  . sodium chloride  3 mL Intravenous Q12H   Continuous Infusions: . sodium chloride 10 mL/hr at 10/15/13 2300   PRN Meds:.sodium chloride, sodium chloride, sodium chloride, albuterol, fentaNYL, fentaNYL, sodium chloride    Filed Vitals:   10/16/13 0400 10/16/13 0500 10/16/13 0600 10/16/13 0726  BP: 151/68 150/64 157/73   Pulse: 63 62 65 64  Temp: 98 F (36.7 C) 98 F (36.7 C) 97.9 F (36.6 C)   TempSrc:      Resp: 14 14 16 15   Height:      Weight:  120 lb 5.9 oz (54.6 kg)    SpO2: 99% 99% 99% 100%    Intake/Output Summary (Last 24 hours) at 10/16/13 0800 Last data filed at 10/16/13 0600  Gross per 24 hour  Intake   1310 ml  Output   1875 ml  Net   -565 ml    LABS: Basic Metabolic Panel:  Recent Labs  10/15/13 1318 10/16/13 0335  NA 136* 138  K 3.6* 4.0  CL 99 101  CO2 21 22  GLUCOSE 34* 82  BUN 74* 73*  CREATININE 1.62* 1.73*  CALCIUM 8.1* 8.2*   Liver Function Tests: No results found for this basename: AST, ALT, ALKPHOS, BILITOT, PROT, ALBUMIN,  in the last 72 hours No results found for this basename: LIPASE, AMYLASE,  in the last 72 hours CBC:  Recent Labs  10/15/13 1318 10/16/13 0335  WBC 5.5 7.1  NEUTROABS 4.6  --   HGB  6.0* 11.6*  HCT 17.8* 34.0*  MCV 89.9 89.0  PLT 91* 63*   Cardiac Enzymes:  Recent Labs  10/15/13 1318 10/16/13 0330  TROPONINI <0.30 <0.30   BNP: No components found with this basename: POCBNP,  D-Dimer: No results found for this basename: DDIMER,  in the last 72 hours Hemoglobin A1C: No results found for this basename: HGBA1C,  in the last 72 hours Fasting Lipid Panel: No results found for this basename: CHOL, HDL, LDLCALC, TRIG, CHOLHDL, LDLDIRECT,  in the last 72 hours Thyroid Function Tests:  Recent Labs  10/15/13 1914  TSH 2.790   Anemia Panel: No results found for this basename: VITAMINB12, FOLATE, FERRITIN, TIBC, IRON, RETICCTPCT,  in the last 72 hours  RADIOLOGY: Ct Head Wo Contrast  10/15/2013   CLINICAL DATA:  Respiratory arrest, altered mental status  EXAM: CT HEAD WITHOUT CONTRAST  TECHNIQUE: Contiguous axial images were obtained from the base of the skull through the vertex without intravenous contrast.  COMPARISON:  None.  FINDINGS: There is no evidence of mass effect, midline shift, or extra-axial fluid collections. There is no evidence of a space-occupying lesion or intracranial hemorrhage. There is no evidence of a cortical-based area of acute infarction.  There is generalized cerebral atrophy. There is periventricular white matter low attenuation likely secondary to microangiopathy.  The ventricles and sulci are appropriate for the patient's age. The basal cisterns are patent.  Visualized portions of the orbits are unremarkable. There is a small amount of fluid in the ethmoid sinuses posteriorly. There is a small amount of fluid in the sphenoid sinus. There is high-density fluid in the posterior nasal passage concerning for blood products. Cerebrovascular atherosclerotic calcifications are noted.  The osseous structures are unremarkable.  IMPRESSION: 1. No acute intracranial pathology. 2. There is high-density fluid in the posterior nasal passage concerning for blood  products.   Electronically Signed   By: Kathreen Devoid   On: 10/15/2013 15:02   Dg Chest Port 1 View  10/16/2013   CLINICAL DATA:  Airspace disease.  EXAM: PORTABLE CHEST - 1 VIEW  COMPARISON:  10/15/2013.  FINDINGS: Endotracheal tube 1.2 cm above the carina. Proximal repositioning should be considered. Left IJ line in good anatomic position. NG tube in good anatomic position. Stable cardiomegaly with normal pulmonary vascularity. Bilateral airspace disease again noted. Small pleural effusions noted. Airspace disease could be secondary to pulmonary edema, ARDS, and/or pneumonia. No acute bony abnormality.  IMPRESSION: 1. Endotracheal tube tip 1.2 cm above the carina. 2. Left IJ line and NG tube in stable position. 3. Persistent bilateral airspace disease. This could be secondary to pulmonary edema, ARDS, and/or bilateral pneumonia. Associated small bilateral pleural effusions noted. 4. Cardiomegaly.   Electronically Signed   By: Indios   On: 10/16/2013 07:49   Dg Chest Port 1 View  10/15/2013   CLINICAL DATA:  Post central line placement. Evaluate for pneumothorax.  EXAM: PORTABLE CHEST - 1 VIEW  COMPARISON:  Earlier same day  FINDINGS: Grossly unchanged enlarged cardiac silhouette and mediastinal contours with atherosclerotic plaque within the thoracic aorta. Interval placement of a left jugular approach intravenous catheter with tip projected over the superior cavoatrial junction. Otherwise, stable position of remaining support apparatus. No pneumothorax. The pulmonary vasculature remains indistinct with cephalization of flow. Interval increase in size of small bilateral effusions with associated worsening bibasilar opacities, left greater than right. Unchanged bones.  IMPRESSION: 1. Interval placement of left jugular approach intravenous catheter with tip projected of the superior cavoatrial junction. Otherwise, stable position of remaining support apparatus. No pneumothorax. 2. Grossly unchanged  findings of pulmonary edema with minimal increase in size of bilateral effusions and associated worsening bibasilar opacities, left greater than right. 3. Cardiomegaly and atherosclerosis.   Electronically Signed   By: Sandi Mariscal M.D.   On: 10/15/2013 18:59   Dg Chest Portable 1 View  10/15/2013   CLINICAL DATA:  Vaginal bleeding.  Unresponsive.  EXAM: PORTABLE CHEST - 1 VIEW  COMPARISON:  None.  FINDINGS: Endotracheal tube tip lies 2.4 cm above the chronic. Nasogastric tube passes below the diaphragm well into the stomach.  Cardiac silhouette is mildly enlarged. No mediastinal or hilar masses. There are prominent bronchovascular markings. No lung consolidation or edema. No pleural effusion or gross pneumothorax on this supine study.  Bony thorax is demineralized but intact.  IMPRESSION: 1. Endotracheal tube and nasogastric tube are well positioned. 2. No acute cardiopulmonary disease.   Electronically Signed   By: Lajean Manes M.D.   On: 10/15/2013 13:47   Dg Abd Portable 1v  10/15/2013   CLINICAL DATA:  NG tube placement  EXAM: PORTABLE ABDOMEN - 1 VIEW  COMPARISON:  None.  FINDINGS: Enteric tube tip and side  port projects over the expected location of the gastric antrum.  There is a paucity of bowel gas without definite evidence of obstruction.  Nondiagnostic evaluation for pneumoperitoneum secondary supine positioning exclusion the lower thorax. No definite pneumatosis or portal venous gas.  Limited visualization of lower thorax suggests small left-sided effusion with associated left basilar opacities. There is exclusion of the caudal aspect of the right hemi thorax.  No acute osseus abnormalities. A catheter overlies the urinary bladder.  IMPRESSION: Enteric tube tip and side port projects over the gastric fundus.   Electronically Signed   By: Sandi Mariscal M.D.   On: 10/15/2013 19:06    PHYSICAL EXAM General: Intubated, awake Neck: Left IJ CVL, oozing around CVL.  Lungs: Clear to auscultation  anteriorly CV: Nondisplaced PMI.  Heart regular S1/S2, no S3/S4, no murmur.  No peripheral edema.   Abdomen: Soft, nontender, no hepatosplenomegaly, no distention.  Neurologic: Alert, follows commands Psych: Normal affect. Extremities: No clubbing or cyanosis.   TELEMETRY: Reviewed telemetry pt in NSR  ASSESSMENT AND PLAN: 63 yo with history of CAD and ischemic cardiomyopathy, chronic CHF with predominantly pulmonary venous hypertension, and CKD stage IV was found down at home and intubated.  He was noted to have probable lower GI bleeding with hemoglobin 6 at admission and pulmonary edema on CXR.   1. Pulmonary: Intubated. Suspect pulmonary edema + possible aspiration pneumonitis/PNA.  He is on Augmentin.  2. Neuro: Awake on vent, follows commands.  3. Acute on chronic systolic CHF: Ischemic cardiomyopathy, EF 35-40% on last echo.  CVP 20 last night.  Co-ox preserved at 70%.  Suggests preserved cardiac output with significant volume overload.  Not sure how compliant patient is with medication regimen at home.  He has missed outpatient followup visits in CHF clinic.   - Needs diuresis.  Creatinine is currently at his baseline.  Will start Lasix 40 mg IV every 8 hours and follow response.   - BP elevated.  Will start hydralazine 25 mg every 8 hrs + isordil 10 mg every 8 hrs for afterload reduction and titrate up.  On high dose hydralazine/nitrates at home.   - Continue to follow CVP, will get coox again in am.  4. CAD: Troponin negative, doubt ACS.  Hold off on ASA for now with low platelets and oozing around central line. Will restart home statin. 5. GI bleeding: Hematochezia has been noted.  Possible lower GI bleeding.  Hemoglobin up to 11.6 after transfusion.  Started with hemoglobin 6.  GI following, supportive care for now.  On PPI.  6. CKD: Stage IV.  Creatinine was 1.7 this morning which is around his baseline (1.8-2 prior).  Will follow closely with diuresis.   Loralie Champagne 10/16/2013 8:08 AM

## 2013-10-16 NOTE — Progress Notes (Signed)
  Echocardiogram 2D Echocardiogram has been performed.  Diamond Nickel 10/16/2013, 12:13 PM

## 2013-10-16 NOTE — Progress Notes (Signed)
Daily Rounding Note  10/16/2013, 8:22 AM  LOS: 1 day   SUBJECTIVE:       Smear of brown stool this AM.  Still with blood per OG tube.  Pt denies abdominal pain but c/o pain at neck presumably from the ETT.   OBJECTIVE:         Vital signs in last 24 hours:    Temp:  [95 F (35 C)-98.2 F (36.8 C)] 97.9 F (36.6 C) (09/09 0600) Pulse Rate:  [58-77] 64 (09/09 0726) Resp:  [14-22] 15 (09/09 0726) BP: (144-171)/(63-94) 157/73 mmHg (09/09 0600) SpO2:  [99 %-100 %] 100 % (09/09 0726) FiO2 (%):  [40 %-100 %] 40 % (09/09 0726) Weight:  [52.164 kg (115 lb)-54.6 kg (120 lb 5.9 oz)] 54.6 kg (120 lb 5.9 oz) (09/09 0500) Last BM Date: 10/15/13 General: relaxed, cooperative.  On vent but able to non-verbally communicate.  Son in room and translated my questions   Heart: RRR Chest: clear bil in front.  Breathing quietly on vent Abdomen: BS quiet, NT, ND.    Extremities: no CCE.  Feet warm Neuro/Psych:  Mittens in place. No tremor.  Follows commands as possible.  Intake/Output from previous day: 09/08 0701 - 09/09 0700 In: 1310 [I.V.:215; Blood:695; NG/GT:100; IV Piggyback:300] Out: 1875 [Urine:1875]  Intake/Output this shift:    Lab Results:  Recent Labs  10/15/13 1318 10/16/13 0335  WBC 5.5 7.1  HGB 6.0* 11.6*  HCT 17.8* 34.0*  PLT 91* 63*   BMET  Recent Labs  10/15/13 1318 10/16/13 0335  NA 136* 138  K 3.6* 4.0  CL 99 101  CO2 21 22  GLUCOSE 34* 82  BUN 74* 73*  CREATININE 1.62* 1.73*  CALCIUM 8.1* 8.2*   PT/INR  Recent Labs  10/15/13 1318  LABPROT 15.9*  INR 1.27    Studies/Results: Ct Head Wo Contrast 10/15/2013   CLINICAL DATA:  Respiratory arrest, altered mental status  EXAM: CT HEAD WITHOUT CONTRAST  TECHNIQUE: Contiguous axial images were obtained from the base of the skull through the vertex without intravenous contrast.  COMPARISON:  None.  FINDINGS: There is no evidence of mass effect,  midline shift, or extra-axial fluid collections. There is no evidence of a space-occupying lesion or intracranial hemorrhage. There is no evidence of a cortical-based area of acute infarction. There is generalized cerebral atrophy. There is periventricular white matter low attenuation likely secondary to microangiopathy.  The ventricles and sulci are appropriate for the patient's age. The basal cisterns are patent.  Visualized portions of the orbits are unremarkable. There is a small amount of fluid in the ethmoid sinuses posteriorly. There is a small amount of fluid in the sphenoid sinus. There is high-density fluid in the posterior nasal passage concerning for blood products. Cerebrovascular atherosclerotic calcifications are noted.  The osseous structures are unremarkable.  IMPRESSION: 1. No acute intracranial pathology. 2. There is high-density fluid in the posterior nasal passage concerning for blood products.   Electronically Signed   By: Kathreen Devoid   On: 10/15/2013 15:02   Dg Chest Port 1 View 10/16/2013   CLINICAL DATA:  Airspace disease.  EXAM: PORTABLE CHEST - 1 VIEW  COMPARISON:  10/15/2013.  FINDINGS: Endotracheal tube 1.2 cm above the carina. Proximal repositioning should be considered. Left IJ line in good anatomic position. NG tube in good anatomic position. Stable cardiomegaly with normal pulmonary vascularity. Bilateral airspace disease again noted. Small pleural effusions noted. Airspace disease  could be secondary to pulmonary edema, ARDS, and/or pneumonia. No acute bony abnormality.  IMPRESSION: 1. Endotracheal tube tip 1.2 cm above the carina. 2. Left IJ line and NG tube in stable position. 3. Persistent bilateral airspace disease. This could be secondary to pulmonary edema, ARDS, and/or bilateral pneumonia. Associated small bilateral pleural effusions noted. 4. Cardiomegaly.   Electronically Signed   By: Marcello Moores  Register   On: 10/16/2013 07:49   Dg Abd Portable 1v 10/15/2013   CLINICAL DATA:   NG tube placement  EXAM: PORTABLE ABDOMEN - 1 VIEW  COMPARISON:  None.  FINDINGS: Enteric tube tip and side port projects over the expected location of the gastric antrum.  There is a paucity of bowel gas without definite evidence of obstruction.  Nondiagnostic evaluation for pneumoperitoneum secondary supine positioning exclusion the lower thorax. No definite pneumatosis or portal venous gas.  Limited visualization of lower thorax suggests small left-sided effusion with associated left basilar opacities. There is exclusion of the caudal aspect of the right hemi thorax.  No acute osseus abnormalities. A catheter overlies the urinary bladder.  IMPRESSION: Enteric tube tip and side port projects over the gastric fundus.   Electronically Signed   By: Sandi Mariscal M.D.   On: 10/15/2013 19:06   Scheduled Meds: . ampicillin-sulbactam (UNASYN) IV  1.5 g Intravenous Q12H  . antiseptic oral rinse  7 mL Mouth Rinse QID  . atorvastatin  10 mg Oral q1800  . chlorhexidine  15 mL Mouth Rinse BID  . furosemide  40 mg Intravenous Q8H  . hydrALAZINE  25 mg Oral 3 times per day  . [START ON 10/17/2013] Influenza vac split quadrivalent PF  0.5 mL Intramuscular Tomorrow-1000  . insulin aspart  2-6 Units Subcutaneous 6 times per day  . isosorbide dinitrate  10 mg Oral TID WC  . pantoprazole (PROTONIX) IV  40 mg Intravenous QHS  . [START ON 10/17/2013] pneumococcal 23 valent vaccine  0.5 mL Intramuscular Tomorrow-1000  . potassium chloride  20 mEq Per Tube Once  . sodium chloride  3 mL Intravenous Q12H   Continuous Infusions: . sodium chloride 10 mL/hr at 10/15/13 2300   PRN Meds:.sodium chloride, sodium chloride, sodium chloride, albuterol, fentaNYL, fentaNYL, sodium chloride  ASSESMENT:   * Painless hematochezia. Had brown stool this AM.  EGD in 03/2012 was normal. No previous colonoscopy * Blood per OG tube. This is from intubation associated trauma.  * normocytic anemia.  ABL anemia.  5 gram jump in Hgb post 2  units PRBC.  *  Thrombocytopenia.  * Hx ascites. Likely cardiac or malnutrition related as no imaging evidence of liver disease. Anasarca in 06/2013  * CHF, acute.  Resp failure.  Bil  PNA, may have aspirated.  On Unasyn. Intubated.  * 2 vsl CAD * AKI, CKD.      PLAN   *  Continue supportive care.  Dr Henrene Pastor to make decision as to colonoscopy/flex sig.  Minette Brine is sonVerdis Prime    [name] [name]  10/16/2013, 8:22 AM Pager: [phone]  GI ATTENDING  Interval history data reviewed. Patient seen and examined. Agree with H&P as above. Stable from GI standpoint. Significant increase in hemoglobin as expected with a combination of transfusion and diuresis. Stool is brown currently. Thus, no significant GI bleeding. Priority is management of acute cardiopulmonary problems. No plans for endoscopic evaluations in the immediate future. Continue PPI. Discussed with Dr. Jamal Collin of critical care medicine who agrees. We're available if  needed. Will sign off. Thank you  Docia Chuck. Geri Seminole., M.D. [facility] Division of Gastroenterology

## 2013-10-16 NOTE — Procedures (Signed)
Extubation Procedure Note  Patient Details:   Name: [name] [name] DOB: October 18, 1950 MRN: [id number]   Airway Documentation:     Evaluation  O2 sats: stable throughout Complications: No apparent complications Patient did tolerate procedure well. Bilateral Breath Sounds: Clear;Diminished Suctioning: Oral;Airway Yes Extubated PT to 3L [name]. PT was able to speak and her sats are stable  Chin Wachter, Leonie Douglas 10/16/2013, 2:34 PM

## 2013-10-16 NOTE — Progress Notes (Signed)
Chaplain followed up with patient's nurse following rounds as to the visitation of the patient's son.  Chaplain has prior contact with the patient's son during admittance.  Son was not present.  Chaplain will follow up as needed.  10/16/13 1100  Clinical Encounter Type  Visited With Health care provider  Visit Type Follow-up  Stress Factors  Patient Stress Factors None identified  Family Stress Factors None identified   Mertie Moores, Chaplain

## 2013-10-16 NOTE — Progress Notes (Signed)
UR Completed.  Vergie Living [phone] 10/16/2013

## 2013-10-16 NOTE — Progress Notes (Signed)
PULMONARY / CRITICAL CARE MEDICINE   Name: [name] [name] MRN: [id number] DOB: 1951/01/01    ADMISSION DATE:  10/15/2013 CONSULTATION DATE:  10/15/2013  REFERRING MD :  EDP  INITIAL PRESENTATION: 63 year old female with DM, CHF, presented to [facility] ED 9/8 after being found down. EMS noted several episodes of apnea and used BVM en route. Intubated in ED where pink frothy secretions and BNP 23k noted. PCCM asked to see for admission.   PMH listed under different med rec number [id number]  STUDIES:  9/08 Ct head: NAD 9/09 TTE: LVEF 30-35%. Multiple RWMAs. PASP est 79 mmHg  SIGNIFICANT EVENTS: 9/8 intubated, ICU admission 9/09 Passed SBT and extubated  SUBJECTIVE:  Passed SBT. Extubated. No distress post extubation. Excessive persistent bleeding from R IJ CVL site. Therefore DC'd  VITAL SIGNS: Temp:  [96.2 F (35.7 C)-98.8 F (37.1 C)] 98.1 F (36.7 C) (09/09 1600) Pulse Rate:  [58-75] 70 (09/09 1300) Resp:  [14-22] 14 (09/09 1300) BP: (144-163)/(63-83) 163/72 mmHg (09/09 1300) SpO2:  [2 %-100 %] 2 % (09/09 1600) FiO2 (%):  [40 %] 40 % (09/09 1100) Weight:  [54.6 kg (120 lb 5.9 oz)] 54.6 kg (120 lb 5.9 oz) (09/09 0500) HEMODYNAMICS: CVP:  [8 mmHg-20 mmHg] 8 mmHg VENTILATOR SETTINGS: Vent Mode:  [-] CPAP;PSV FiO2 (%):  [40 %] 40 % Set Rate:  [14 bmp] 14 bmp Vt Set:  [450 mL] 450 mL PEEP:  [5 cmH20] 5 cmH20 Pressure Support:  [5 cmH20] 5 cmH20 Plateau Pressure:  [20 cmH20-23 cmH20] 20 cmH20 INTAKE / OUTPUT:  Intake/Output Summary (Last 24 hours) at 10/16/13 1702 Last data filed at 10/16/13 1600  Gross per 24 hour  Intake   1360 ml  Output   1920 ml  Net   -560 ml    PHYSICAL EXAMINATION: General: diminutive, NAD Neuro: no focal deficits noted.  HEENT: normal Cardiovascular:  Reg, no M noted Lungs:  Clear anteriorly Abdomen:  Soft, non-tender, non-distended Ext:  No edema, warm  LABS: I have reviewed all of today's lab results. Relevant abnormalities are discussed in  the A/P section   ASSESSMENT / PLAN:  PULMONARY ETT 9/8 >9/09 A: Acute respiratory failure 2nd to pulmonary edema Pulm htn Doubt PNA  P:   Monitor in ICU post extubation SuppO2 to maintain SpO2 > 93% Cont diuresis as permitted by BP and renal function  CARDIOVASCULAR A:  Severe systolic CM Bradycardia, resolved Hypertension, controlled MI R/O'd by markers 9/09 P:  Further eval and mgmt per Cards Cont ASA, statin Cont scheduled hydralazine and nitrates  RENAL A:   CKD Hypokalemia, resolved P:   Monitor BMET intermittently Monitor I/Os Correct electrolytes as indicated  GASTROINTESTINAL A:   No further evidence of LGIB P:   SUP: N/I post extubation NPO post extubation  HEMATOLOGIC A:   Anemia without overt bleeding Thrombocytopenia of unclear etiology P:  Transfused 2 units PRBC for Hgb 6.0 in ED DVT px: SCDs Monitor CBC intermittently Transfuse per usual ICU guidelines  INFECTIOUS A:   Hypothermia, resolved Doubt acute infectious process P:   BCx2 9/8 >>> UC 9/8 >>> Sputum 9/8 >>> Abx: unasyn, start date 9/8 >> 9/09 DC vanc/zosyn  ENDOCRINE A:   Hypoglycemia DM 2   P:   CBG monitoring Consider SSI for glu > 180  NEUROLOGIC A:   Acute encephalopathy, resolved P:   RASS goal: 0  CCM X 35 mins  Merton Border, MD ; [facility] service Mobile [phone].  After 5:30 PM or  weekends, call [phone]

## 2013-10-17 ENCOUNTER — Inpatient Hospital Stay (HOSPITAL_COMMUNITY): Payer: Medicaid Other

## 2013-10-17 ENCOUNTER — Encounter (HOSPITAL_COMMUNITY): Payer: Self-pay | Admitting: *Deleted

## 2013-10-17 DIAGNOSIS — M109 Gout, unspecified: Secondary | ICD-10-CM | POA: Diagnosis not present

## 2013-10-17 DIAGNOSIS — M79609 Pain in unspecified limb: Secondary | ICD-10-CM

## 2013-10-17 DIAGNOSIS — I5023 Acute on chronic systolic (congestive) heart failure: Principal | ICD-10-CM

## 2013-10-17 LAB — BASIC METABOLIC PANEL
ANION GAP: 15 (ref 5–15)
BUN: 74 mg/dL — ABNORMAL HIGH (ref 6–23)
CALCIUM: 8.1 mg/dL — AB (ref 8.4–10.5)
CO2: 22 mEq/L (ref 19–32)
CREATININE: 1.71 mg/dL — AB (ref 0.50–1.10)
Chloride: 103 mEq/L (ref 96–112)
GFR calc non Af Amer: 31 mL/min — ABNORMAL LOW (ref >90)
GFR, EST AFRICAN AMERICAN: 36 mL/min — AB (ref >90)
Glucose, Bld: 112 mg/dL — ABNORMAL HIGH (ref 70–99)
Potassium: 3.5 mEq/L — ABNORMAL LOW (ref 3.7–5.3)
Sodium: 140 mEq/L (ref 137–147)

## 2013-10-17 LAB — URIC ACID: URIC ACID, SERUM: 15.9 mg/dL — AB (ref 2.4–7.0)

## 2013-10-17 LAB — GLUCOSE, CAPILLARY
Glucose-Capillary: 94 mg/dL (ref 70–99)
Glucose-Capillary: 198 mg/dL — ABNORMAL HIGH (ref 70–99)

## 2013-10-17 LAB — CBC
HCT: 30.9 % — ABNORMAL LOW (ref 36.0–46.0)
Hemoglobin: 10.2 g/dL — ABNORMAL LOW (ref 12.0–15.0)
MCH: 30.2 pg (ref 26.0–34.0)
MCHC: 33 g/dL (ref 30.0–36.0)
MCV: 91.4 fL (ref 78.0–100.0)
Platelets: 58 10*3/uL — ABNORMAL LOW (ref 150–400)
RBC: 3.38 MIL/uL — ABNORMAL LOW (ref 3.87–5.11)
RDW: 17.1 % — AB (ref 11.5–15.5)
WBC: 5.3 10*3/uL (ref 4.0–10.5)

## 2013-10-17 MED ORDER — FUROSEMIDE 10 MG/ML IJ SOLN
60.0000 mg | Freq: Three times a day (TID) | INTRAMUSCULAR | Status: DC
  Administered 2013-10-17: 60 mg via INTRAVENOUS
  Filled 2013-10-17: qty 6

## 2013-10-17 MED ORDER — ACETAMINOPHEN 325 MG PO TABS
650.0000 mg | ORAL_TABLET | Freq: Four times a day (QID) | ORAL | Status: DC | PRN
  Administered 2013-10-17 – 2013-10-21 (×5): 650 mg via ORAL
  Filled 2013-10-17 (×5): qty 2

## 2013-10-17 MED ORDER — DEXTROSE 5 % IV SOLN
1.0000 g | INTRAVENOUS | Status: AC
  Administered 2013-10-17 – 2013-10-19 (×3): 1 g via INTRAVENOUS
  Filled 2013-10-17 (×3): qty 10

## 2013-10-17 MED ORDER — PREDNISONE 20 MG PO TABS
40.0000 mg | ORAL_TABLET | Freq: Every day | ORAL | Status: AC
  Administered 2013-10-18 – 2013-10-20 (×3): 40 mg via ORAL
  Filled 2013-10-17 (×3): qty 2

## 2013-10-17 MED ORDER — FUROSEMIDE 10 MG/ML IJ SOLN: 40.0000 mg | Freq: Three times a day (TID) | INTRAMUSCULAR | Status: DC

## 2013-10-17 MED ORDER — TORSEMIDE 20 MG PO TABS
40.0000 mg | ORAL_TABLET | Freq: Two times a day (BID) | ORAL | Status: DC
  Administered 2013-10-17 – 2013-10-18 (×3): 40 mg via ORAL
  Filled 2013-10-17 (×4): qty 2

## 2013-10-17 MED ORDER — ACETAMINOPHEN 325 MG PO TABS: 650.0000 mg | ORAL_TABLET | Freq: Four times a day (QID) | ORAL | Status: DC | PRN

## 2013-10-17 MED ORDER — ISOSORBIDE MONONITRATE ER 60 MG PO TB24
60.0000 mg | ORAL_TABLET | Freq: Two times a day (BID) | ORAL | Status: DC
  Administered 2013-10-18: 60 mg via ORAL
  Filled 2013-10-17 (×2): qty 1

## 2013-10-17 MED ORDER — POTASSIUM CHLORIDE CRYS ER 20 MEQ PO TBCR
40.0000 meq | EXTENDED_RELEASE_TABLET | Freq: Once | ORAL | Status: AC
  Administered 2013-10-17: 40 meq via ORAL
  Filled 2013-10-17: qty 2

## 2013-10-17 MED ORDER — HYDRALAZINE HCL 50 MG PO TABS
50.0000 mg | ORAL_TABLET | Freq: Three times a day (TID) | ORAL | Status: DC
  Filled 2013-10-17 (×4): qty 1

## 2013-10-17 MED ORDER — ISOSORBIDE DINITRATE 20 MG PO TABS
20.0000 mg | ORAL_TABLET | Freq: Three times a day (TID) | ORAL | Status: AC
  Administered 2013-10-17 (×3): 20 mg via ORAL
  Filled 2013-10-17 (×3): qty 1

## 2013-10-17 MED ORDER — HYDRALAZINE HCL 25 MG PO TABS
25.0000 mg | ORAL_TABLET | Freq: Three times a day (TID) | ORAL | Status: DC
  Filled 2013-10-17 (×4): qty 1

## 2013-10-17 MED ORDER — PREDNISONE 20 MG PO TABS
40.0000 mg | ORAL_TABLET | Freq: Every day | ORAL | Status: DC
  Filled 2013-10-17: qty 2

## 2013-10-17 MED ORDER — DEXTROSE 5 % IV SOLN: 1.0000 g | INTRAVENOUS | Status: DC

## 2013-10-17 MED ORDER — HYDRALAZINE HCL 25 MG PO TABS
37.5000 mg | ORAL_TABLET | Freq: Three times a day (TID) | ORAL | Status: DC
  Administered 2013-10-17 – 2013-10-18 (×4): 37.5 mg via ORAL
  Filled 2013-10-17 (×7): qty 1.5

## 2013-10-17 NOTE — Plan of Care (Signed)
Problem: Phase I Progression Outcomes Goal: Voiding-avoid urinary catheter unless indicated Outcome: Not Applicable Date Met:  10/68/16 Pt has foley

## 2013-10-17 NOTE — Progress Notes (Signed)
Pt uric Acid level 15.9; Darrick Grinder, NP notified; no orders received.  Lenor Coffin, RN

## 2013-10-17 NOTE — Progress Notes (Signed)
Chaplain visited with patient in room.  Patient was awake and expressed she felt pain in left hand.  MD was informed by Chaplain immediately following visit.  Chaplain explained that he was with patient's son when patient was brought into E.D.  Patient smiled and referred to son by name, then shared he would return at 85.  Chaplain offered emotional support and ministry of presence.    Will follow up as needed.   10/17/13 1100  Clinical Encounter Type  Visited With Patient;Health care provider  Visit Type Follow-up  Spiritual Encounters  Spiritual Needs Emotional  Stress Factors  Patient Stress Factors Exhausted;Health changes  Family Stress Factors None identified  Advance Directives (For Healthcare)  Does patient have an advance directive? No   Mertie Moores, Chaplain

## 2013-10-17 NOTE — Progress Notes (Addendum)
Pt admitted to room 3E08. Oriented pt to unit and room and placed call bell in reach. Pt c/o pain in L hand. MD aware of pain. Pt speaks limited English but able to make needs known. Will continue to monitor pt closely. Report received from [place], [place].  Eulis Canner, RN

## 2013-10-17 NOTE — Progress Notes (Signed)
PULMONARY / CRITICAL CARE MEDICINE   Name: Tiersa Dayley MRN: [id number] DOB: November 28, 1950    ADMISSION DATE:  10/15/2013 CONSULTATION DATE:  10/15/2013  REFERRING MD :  EDP  INITIAL PRESENTATION: 63 year old female with DM, CHF, presented to [facility] ED 9/8 after being found down. EMS noted several episodes of apnea and used BVM en route. Intubated in ED where pink frothy secretions and BNP 23k noted. PCCM asked to see for admission.   PMH listed under different med rec number [id number]  STUDIES:  9/08 Ct head: NAD 9/09 TTE: LVEF 30-35%. Multiple RWMAs. PASP est 79 mmHg 9/10 L hand and wrist: no acute bony or soft tissue abnormalities  SIGNIFICANT EVENTS: 9/8 intubated, ICU admission 9/09 Passed SBT and extubated 9/10 Exquisitely tender L hand with erythema. Xrays negative. Uric acid markedly elevated. Prednisone ordered. Concern for possible cellulitis related to IVs, ABG punctures - ceftriaxone ordered X 3 days.  9/10 Tolerating extubation. Transferred to telemetry floor  SUBJECTIVE:  No overt distress. Exquisitely tender L hand. Uric acid markedly elevated  VITAL SIGNS: Temp:  [96.7 F (35.9 C)-98.9 F (37.2 C)] 97.7 F (36.5 C) (09/10 1439) Pulse Rate:  [68-72] 72 (09/10 1300) Resp:  [13-16] 16 (09/10 1439) BP: (125-158)/(52-84) 147/71 mmHg (09/10 1439) SpO2:  [91 %-99 %] 96 % (09/10 1439) Weight:  [52.8 kg (116 lb 6.5 oz)-53 kg (116 lb 13.5 oz)] 52.8 kg (116 lb 6.5 oz) (09/10 1429) HEMODYNAMICS:   VENTILATOR SETTINGS:   INTAKE / OUTPUT:  Intake/Output Summary (Last 24 hours) at 10/17/13 1644 Last data filed at 10/17/13 1302  Gross per 24 hour  Intake    710 ml  Output   1265 ml  Net   -555 ml    PHYSICAL EXAMINATION: General: diminutive, NAD Neuro: no focal deficits noted.  HEENT: normal Cardiovascular:  Reg, no M noted Lungs:  Clear anteriorly Abdomen:  Soft, non-tender, non-distended Ext:  L hand swollen, erythematous and exquisitely tender  LABS: I have  reviewed all of today's lab results. Relevant abnormalities are discussed in the A/P section  CXR: CM, no overt edema  ASSESSMENT / PLAN:  PULMONARY ETT 9/8 >9/09 A: Acute respiratory failure Pulm edema, essentially resolved by CXR Secondary PAH P:   SuppO2 to maintain SpO2 > 93% Cont diuresis as permitted by BP and renal function  Changed back to her home oral regimen of torsemide  CARDIOVASCULAR A:  Severe systolic CM Bradycardia, resolved Hypertension, controlled MI R/O'd by markers 9/09 P:  Cards managing  RENAL A:   CKD Hypokalemia, resolved P:   Monitor BMET intermittently Monitor I/Os Correct electrolytes as indicated  GASTROINTESTINAL A:   No acute issues P:   Advance diet as tolerated   HEMATOLOGIC A:   Anemia without overt bleeding Thrombocytopenia of unclear etiology P:  Transfused 2 units PRBC for Hgb 6.0 in ED DVT px: SCDs Monitor CBC intermittently Transfuse per usual guidelines  INFECTIOUS A:   Hypothermia, resolved Doubt acute infectious process Possible L hand soft tissue infection (more likely gout) P:   BCx2 9/8 >>> UC 9/8 >>> Sputum 9/8 >>> Abx: unasyn, start date 9/8 >> 9/09 Ceftriaxone 9/10 >> 3 days ordered. To be reassessed by subsequent MDs  ENDOCRINE A:   Hypoglycemia, resolved DM 2 P:   CBG monitoring Consider SSI for glu > 180  NEUROLOGIC A:   Acute encephalopathy, resolved P:   RASS goal: 0  Transfer to tele. Cards to cont to follow. TRH to assume primary duties  AM 9/11 and PCCM to sign off  Merton Border, MD ; [facility] [phone].  After 5:30 PM or weekends, call [phone]

## 2013-10-17 NOTE — Progress Notes (Signed)
Patient ID: [name] [name], female   DOB: 06-20-1950, 63 y.o.   MRN: [id number]   SUBJECTIVE:   Yesterday given 40 mg IV lasix every 8 hours. -1.5 liters. Weight down 4 pounds. Also started on hydralazine/isordil.Per GI- hgb stable. No role for endoscopic evaluation. Extubated. Passed swallow . Central line discontinued due to bleeding at site.   Complaining L hand pain. Denies SOB.   ECHO EF 30-35% Akinesis of the mid-apicalinferolateral and inferior myocardium. Akinesis of the mid-apicalanterior, anterolateral, and apical myocardium.   Hgb 11.6>10.2 Creatinine 1.7  Scheduled Meds: . atorvastatin  10 mg Oral q1800  . furosemide  20 mg Intravenous Q8H  . hydrALAZINE  10 mg Intravenous Q6H  . Influenza vac split quadrivalent PF  0.5 mL Intramuscular Tomorrow-1000  . isosorbide dinitrate  10 mg Oral TID  . pneumococcal 23 valent vaccine  0.5 mL Intramuscular Tomorrow-1000   Continuous Infusions:   PRN Meds:.    Filed Vitals:   10/17/13 0200 10/17/13 0300 10/17/13 0400 10/17/13 0520  BP: 128/60 137/58 136/64 158/72  Pulse:      Temp: 97.8 F (36.6 C) 98.2 F (36.8 C) 98.5 F (36.9 C)   TempSrc:      Resp:      Height:      Weight:   116 lb 13.5 oz (53 kg)   SpO2: 91% 93% 92%     Intake/Output Summary (Last 24 hours) at 10/17/13 0648 Last data filed at 10/17/13 0400  Gross per 24 hour  Intake    310 ml  Output   1840 ml  Net  -1530 ml    LABS: Basic Metabolic Panel:  Recent Labs  10/16/13 1532 10/17/13 0325  NA 139 140  K 4.0 3.5*  CL 102 103  CO2 22 22  GLUCOSE 70 112*  BUN 73* 74*  CREATININE 1.70* 1.71*  CALCIUM 8.1* 8.1*   Liver Function Tests: No results found for this basename: AST, ALT, ALKPHOS, BILITOT, PROT, ALBUMIN,  in the last 72 hours No results found for this basename: LIPASE, AMYLASE,  in the last 72 hours CBC:  Recent Labs  10/15/13 1318 10/16/13 0335 10/17/13 0325  WBC 5.5 7.1 5.3  NEUTROABS 4.6  --   --   HGB 6.0* 11.6* 10.2*    HCT 17.8* 34.0* 30.9*  MCV 89.9 89.0 91.4  PLT 91* 63* 58*   Cardiac Enzymes:  Recent Labs  10/15/13 1318 10/16/13 0330  TROPONINI <0.30 <0.30   BNP: No components found with this basename: POCBNP,  D-Dimer: No results found for this basename: DDIMER,  in the last 72 hours Hemoglobin A1C: No results found for this basename: HGBA1C,  in the last 72 hours Fasting Lipid Panel: No results found for this basename: CHOL, HDL, LDLCALC, TRIG, CHOLHDL, LDLDIRECT,  in the last 72 hours Thyroid Function Tests:  Recent Labs  10/15/13 1914  TSH 2.790   Anemia Panel: No results found for this basename: VITAMINB12, FOLATE, FERRITIN, TIBC, IRON, RETICCTPCT,  in the last 72 hours  RADIOLOGY: Ct Head Wo Contrast  10/15/2013   CLINICAL DATA:  Respiratory arrest, altered mental status  EXAM: CT HEAD WITHOUT CONTRAST  TECHNIQUE: Contiguous axial images were obtained from the base of the skull through the vertex without intravenous contrast.  COMPARISON:  None.  FINDINGS: There is no evidence of mass effect, midline shift, or extra-axial fluid collections. There is no evidence of a space-occupying lesion or intracranial hemorrhage. There is no evidence of a cortical-based area of  acute infarction. There is generalized cerebral atrophy. There is periventricular white matter low attenuation likely secondary to microangiopathy.  The ventricles and sulci are appropriate for the patient's age. The basal cisterns are patent.  Visualized portions of the orbits are unremarkable. There is a small amount of fluid in the ethmoid sinuses posteriorly. There is a small amount of fluid in the sphenoid sinus. There is high-density fluid in the posterior nasal passage concerning for blood products. Cerebrovascular atherosclerotic calcifications are noted.  The osseous structures are unremarkable.  IMPRESSION: 1. No acute intracranial pathology. 2. There is high-density fluid in the posterior nasal passage concerning for  blood products.   Electronically Signed   By: Kathreen Devoid   On: 10/15/2013 15:02   Dg Chest Port 1 View  10/16/2013   CLINICAL DATA:  Airspace disease.  EXAM: PORTABLE CHEST - 1 VIEW  COMPARISON:  10/15/2013.  FINDINGS: Endotracheal tube 1.2 cm above the carina. Proximal repositioning should be considered. Left IJ line in good anatomic position. NG tube in good anatomic position. Stable cardiomegaly with normal pulmonary vascularity. Bilateral airspace disease again noted. Small pleural effusions noted. Airspace disease could be secondary to pulmonary edema, ARDS, and/or pneumonia. No acute bony abnormality.  IMPRESSION: 1. Endotracheal tube tip 1.2 cm above the carina. 2. Left IJ line and NG tube in stable position. 3. Persistent bilateral airspace disease. This could be secondary to pulmonary edema, ARDS, and/or bilateral pneumonia. Associated small bilateral pleural effusions noted. 4. Cardiomegaly.   Electronically Signed   By: [place]   On: 10/16/2013 07:49   Dg Chest Port 1 View  10/15/2013   CLINICAL DATA:  Post central line placement. Evaluate for pneumothorax.  EXAM: PORTABLE CHEST - 1 VIEW  COMPARISON:  Earlier same day  FINDINGS: Grossly unchanged enlarged cardiac silhouette and mediastinal contours with atherosclerotic plaque within the thoracic aorta. Interval placement of a left jugular approach intravenous catheter with tip projected over the superior cavoatrial junction. Otherwise, stable position of remaining support apparatus. No pneumothorax. The pulmonary vasculature remains indistinct with cephalization of flow. Interval increase in size of small bilateral effusions with associated worsening bibasilar opacities, left greater than right. Unchanged bones.  IMPRESSION: 1. Interval placement of left jugular approach intravenous catheter with tip projected of the superior cavoatrial junction. Otherwise, stable position of remaining support apparatus. No pneumothorax. 2. Grossly  unchanged findings of pulmonary edema with minimal increase in size of bilateral effusions and associated worsening bibasilar opacities, left greater than right. 3. Cardiomegaly and atherosclerosis.   Electronically Signed   By: Sandi Mariscal M.D.   On: 10/15/2013 18:59   Dg Chest Portable 1 View  10/15/2013   CLINICAL DATA:  Vaginal bleeding.  Unresponsive.  EXAM: PORTABLE CHEST - 1 VIEW  COMPARISON:  None.  FINDINGS: Endotracheal tube tip lies 2.4 cm above the chronic. Nasogastric tube passes below the diaphragm well into the stomach.  Cardiac silhouette is mildly enlarged. No mediastinal or hilar masses. There are prominent bronchovascular markings. No lung consolidation or edema. No pleural effusion or gross pneumothorax on this supine study.  Bony thorax is demineralized but intact.  IMPRESSION: 1. Endotracheal tube and nasogastric tube are well positioned. 2. No acute cardiopulmonary disease.   Electronically Signed   By: Lajean Manes M.D.   On: 10/15/2013 13:47   Dg Abd Portable 1v  10/15/2013   CLINICAL DATA:  NG tube placement  EXAM: PORTABLE ABDOMEN - 1 VIEW  COMPARISON:  None.  FINDINGS: Enteric tube tip  and side port projects over the expected location of the gastric antrum.  There is a paucity of bowel gas without definite evidence of obstruction.  Nondiagnostic evaluation for pneumoperitoneum secondary supine positioning exclusion the lower thorax. No definite pneumatosis or portal venous gas.  Limited visualization of lower thorax suggests small left-sided effusion with associated left basilar opacities. There is exclusion of the caudal aspect of the right hemi thorax.  No acute osseus abnormalities. A catheter overlies the urinary bladder.  IMPRESSION: Enteric tube tip and side port projects over the gastric fundus.   Electronically Signed   By: Sandi Mariscal M.D.   On: 10/15/2013 19:06    PHYSICAL EXAM General: No respiratory distress. Sitting in chair.  Neck: JVP to ear.  Lungs: LLL  crackles.  CV: Nondisplaced PMI.  Heart regular S1/S2, no S3/S4, no murmur.  No peripheral edema.   Abdomen: Soft, nontender, no hepatosplenomegaly, no distention.  Neurologic: Alert, follows commands Psych: Normal affect. Extremities: No clubbing or cyanosis. L hand erythema noted palmar aspect.  L hand weakness noted.   TELEMETRY: Reviewed telemetry pt in NSR  ASSESSMENT AND PLAN: 63 yo with history of CAD and ischemic cardiomyopathy, chronic CHF with predominantly pulmonary venous hypertension, and CKD stage IV was found down at home and intubated.  She was noted to have probable lower GI bleeding with hemoglobin 6 at admission and pulmonary edema on CXR.   1. Pulmonary: Extubated. Passed swallow evaluation. Suspect pulmonary edema + possible aspiration pneumonitis/PNA.  She is on Augmentin.  2. Neuro: Alert and Oriented.  3. Acute on chronic systolic CHF: Ischemic cardiomyopathy, EF 30-35% . CVP removed due to bleeding from site.  Volume status remains elevated.but improving. Continue IV lasix 40 mg IV every 8 hours.  -- BP elevated.  Increase hydralazine to 37.5 mg every 8 hrs + isordil 20 mg every 8 hrs for afterload reduction and titrate up.   4. CAD: Troponin negative, doubt ACS.  Hold off on ASA for now with low platelets and GI bleeding. She is on home statin. 5. GI bleeding: Hematochezia has been noted.  Possible lower GI bleeding.  Hemoglobin up to 11.6 after transfusion.  Started with hemoglobin 6.  Today hemoglobin 10.2. Endoscopic evaluations on hold.  On PPI.  6. CKD: Stage IV.  Creatinine remains stable at 1.7 (Baseline (1.8-2 prior).  Will follow closely with diuresis.  7. L hand pain- ? Gout. Check uric acid. May need prednisone.   CLEGG,AMY NP-C  10/17/2013 6:48 AM  Patient seen with NP, agree with the above note.  She remains volume overloaded but is now extubated and awake/alert.  EF 30-35% on echo.  Main complaint is hand pain.   - Increase Lasix to 60 mg IV every 8  hrs - Titrate up hydralazine/Imdur - Hand pain could be gout but could also have fallen on hand prior to admission when found down. Will get plain films of hand/wrist.   Loralie Champagne 10/17/2013 7:34 AM

## 2013-10-18 DIAGNOSIS — I10 Essential (primary) hypertension: Secondary | ICD-10-CM

## 2013-10-18 LAB — GLUCOSE, CAPILLARY
GLUCOSE-CAPILLARY: 344 mg/dL — AB (ref 70–99)
Glucose-Capillary: 137 mg/dL — ABNORMAL HIGH (ref 70–99)
Glucose-Capillary: 235 mg/dL — ABNORMAL HIGH (ref 70–99)

## 2013-10-18 LAB — BASIC METABOLIC PANEL
Anion gap: 17 — ABNORMAL HIGH (ref 5–15)
BUN: 73 mg/dL — AB (ref 6–23)
CALCIUM: 8.4 mg/dL (ref 8.4–10.5)
CO2: 22 mEq/L (ref 19–32)
CREATININE: 1.88 mg/dL — AB (ref 0.50–1.10)
Chloride: 101 mEq/L (ref 96–112)
GFR, EST AFRICAN AMERICAN: 32 mL/min — AB (ref >90)
GFR, EST NON AFRICAN AMERICAN: 27 mL/min — AB (ref >90)
Glucose, Bld: 137 mg/dL — ABNORMAL HIGH (ref 70–99)
Potassium: 3.7 mEq/L (ref 3.7–5.3)
Sodium: 140 mEq/L (ref 137–147)

## 2013-10-18 LAB — CULTURE, RESPIRATORY

## 2013-10-18 LAB — CBC
HCT: 30.4 % — ABNORMAL LOW (ref 36.0–46.0)
Hemoglobin: 10 g/dL — ABNORMAL LOW (ref 12.0–15.0)
MCH: 30.3 pg (ref 26.0–34.0)
MCHC: 32.9 g/dL (ref 30.0–36.0)
MCV: 92.1 fL (ref 78.0–100.0)
PLATELETS: 58 10*3/uL — AB (ref 150–400)
RBC: 3.3 MIL/uL — ABNORMAL LOW (ref 3.87–5.11)
RDW: 16.9 % — ABNORMAL HIGH (ref 11.5–15.5)
WBC: 6.3 10*3/uL (ref 4.0–10.5)

## 2013-10-18 LAB — CULTURE, RESPIRATORY W GRAM STAIN

## 2013-10-18 MED ORDER — INSULIN ASPART 100 UNIT/ML ~~LOC~~ SOLN
0.0000 [IU] | Freq: Three times a day (TID) | SUBCUTANEOUS | Status: DC
  Administered 2013-10-19: 3 [IU] via SUBCUTANEOUS
  Administered 2013-10-19: 2 [IU] via SUBCUTANEOUS
  Administered 2013-10-19: 5 [IU] via SUBCUTANEOUS
  Administered 2013-10-20: 3 [IU] via SUBCUTANEOUS
  Administered 2013-10-20: 2 [IU] via SUBCUTANEOUS
  Administered 2013-10-20: 3 [IU] via SUBCUTANEOUS
  Administered 2013-10-21 (×2): 2 [IU] via SUBCUTANEOUS

## 2013-10-18 MED ORDER — FUROSEMIDE 10 MG/ML IJ SOLN
40.0000 mg | Freq: Three times a day (TID) | INTRAMUSCULAR | Status: DC
  Administered 2013-10-18 – 2013-10-20 (×6): 40 mg via INTRAVENOUS
  Filled 2013-10-18 (×6): qty 4

## 2013-10-18 MED ORDER — COLCHICINE 0.6 MG PO TABS
0.3000 mg | ORAL_TABLET | Freq: Every day | ORAL | Status: DC
  Administered 2013-10-18 – 2013-10-21 (×4): 0.3 mg via ORAL
  Filled 2013-10-18 (×4): qty 0.5

## 2013-10-18 MED ORDER — INSULIN ASPART 100 UNIT/ML ~~LOC~~ SOLN
0.0000 [IU] | Freq: Every day | SUBCUTANEOUS | Status: DC
  Administered 2013-10-18: 4 [IU] via SUBCUTANEOUS
  Administered 2013-10-19 – 2013-10-20 (×2): 3 [IU] via SUBCUTANEOUS

## 2013-10-18 MED ORDER — SPIRONOLACTONE 12.5 MG HALF TABLET
12.5000 mg | ORAL_TABLET | Freq: Every day | ORAL | Status: DC
  Administered 2013-10-18 – 2013-10-20 (×3): 12.5 mg via ORAL
  Filled 2013-10-18 (×4): qty 1

## 2013-10-18 MED ORDER — ISOSORB DINITRATE-HYDRALAZINE 20-37.5 MG PO TABS
2.0000 | ORAL_TABLET | Freq: Three times a day (TID) | ORAL | Status: DC
  Administered 2013-10-18 – 2013-10-21 (×9): 2 via ORAL
  Filled 2013-10-18 (×11): qty 2

## 2013-10-18 NOTE — Progress Notes (Signed)
Patient ID: [name] [name], female   DOB: 06/23/1950, 63 y.o.   MRN: [id number]   SUBJECTIVE:   Yesterday she transitioned from IV lasix to torsemide. Weight unchanged. Denies SOB. Complaining of L hand pain.    ECHO EF 30-35% Akinesis of the mid-apicalinferolateral and inferior myocardium. Akinesis of the mid-apicalanterior, anterolateral, and apical myocardium.   Uric Acid 15 Hgb 11.6>10.2>10 Creatinine 1.7>1.8  Scheduled Meds: . atorvastatin  10 mg Oral q1800  . cefTRIAXone (ROCEPHIN)  IV  1 g Intravenous Q24H  . hydrALAZINE  37.5 mg Oral 3 times per day  . isosorbide mononitrate  60 mg Oral BID  . predniSONE  40 mg Oral Q breakfast  . torsemide  40 mg Oral BID   Continuous Infusions:   PRN Meds:.    Filed Vitals:   10/17/13 2100 10/18/13 0607 10/18/13 0708 10/18/13 0948  BP: 146/74 164/80 158/70 157/83  Pulse: 88 65 63 65  Temp: 98.2 F (36.8 C) 97.7 F (36.5 C) 97.4 F (36.3 C) 97.7 F (36.5 C)  TempSrc: Oral Oral Axillary Oral  Resp: 20  18 16   Height:      Weight:   116 lb 6.5 oz (52.8 kg)   SpO2: 96% 96% 98% 96%    Intake/Output Summary (Last 24 hours) at 10/18/13 0950 Last data filed at 10/18/13 0946  Gross per 24 hour  Intake   1010 ml  Output   1250 ml  Net   -240 ml    LABS: Basic Metabolic Panel:  Recent Labs  10/17/13 0325 10/18/13 0500  NA 140 140  K 3.5* 3.7  CL 103 101  CO2 22 22  GLUCOSE 112* 137*  BUN 74* 73*  CREATININE 1.71* 1.88*  CALCIUM 8.1* 8.4   Liver Function Tests: No results found for this basename: AST, ALT, ALKPHOS, BILITOT, PROT, ALBUMIN,  in the last 72 hours No results found for this basename: LIPASE, AMYLASE,  in the last 72 hours CBC:  Recent Labs  10/15/13 1318  10/17/13 0325 10/18/13 0500  WBC 5.5  < > 5.3 6.3  NEUTROABS 4.6  --   --   --   HGB 6.0*  < > 10.2* 10.0*  HCT 17.8*  < > 30.9* 30.4*  MCV 89.9  < > 91.4 92.1  PLT 91*  < > 58* 58*  < > = values in this interval not displayed. Cardiac  Enzymes:  Recent Labs  10/15/13 1318 10/16/13 0330  TROPONINI <0.30 <0.30   BNP: No components found with this basename: POCBNP,  D-Dimer: No results found for this basename: DDIMER,  in the last 72 hours Hemoglobin A1C: No results found for this basename: HGBA1C,  in the last 72 hours Fasting Lipid Panel: No results found for this basename: CHOL, HDL, LDLCALC, TRIG, CHOLHDL, LDLDIRECT,  in the last 72 hours Thyroid Function Tests:  Recent Labs  10/15/13 1914  TSH 2.790   Anemia Panel: No results found for this basename: VITAMINB12, FOLATE, FERRITIN, TIBC, IRON, RETICCTPCT,  in the last 72 hours  RADIOLOGY: Ct Head Wo Contrast  10/15/2013   CLINICAL DATA:  Respiratory arrest, altered mental status  EXAM: CT HEAD WITHOUT CONTRAST  TECHNIQUE: Contiguous axial images were obtained from the base of the skull through the vertex without intravenous contrast.  COMPARISON:  None.  FINDINGS: There is no evidence of mass effect, midline shift, or extra-axial fluid collections. There is no evidence of a space-occupying lesion or intracranial hemorrhage. There is no evidence of a  cortical-based area of acute infarction. There is generalized cerebral atrophy. There is periventricular white matter low attenuation likely secondary to microangiopathy.  The ventricles and sulci are appropriate for the patient's age. The basal cisterns are patent.  Visualized portions of the orbits are unremarkable. There is a small amount of fluid in the ethmoid sinuses posteriorly. There is a small amount of fluid in the sphenoid sinus. There is high-density fluid in the posterior nasal passage concerning for blood products. Cerebrovascular atherosclerotic calcifications are noted.  The osseous structures are unremarkable.  IMPRESSION: 1. No acute intracranial pathology. 2. There is high-density fluid in the posterior nasal passage concerning for blood products.   Electronically Signed   By: Kathreen Devoid   On: 10/15/2013  15:02   Dg Chest Port 1 View  10/16/2013   CLINICAL DATA:  Airspace disease.  EXAM: PORTABLE CHEST - 1 VIEW  COMPARISON:  10/15/2013.  FINDINGS: Endotracheal tube 1.2 cm above the carina. Proximal repositioning should be considered. Left IJ line in good anatomic position. NG tube in good anatomic position. Stable cardiomegaly with normal pulmonary vascularity. Bilateral airspace disease again noted. Small pleural effusions noted. Airspace disease could be secondary to pulmonary edema, ARDS, and/or pneumonia. No acute bony abnormality.  IMPRESSION: 1. Endotracheal tube tip 1.2 cm above the carina. 2. Left IJ line and NG tube in stable position. 3. Persistent bilateral airspace disease. This could be secondary to pulmonary edema, ARDS, and/or bilateral pneumonia. Associated small bilateral pleural effusions noted. 4. Cardiomegaly.   Electronically Signed   By: [place]   On: 10/16/2013 07:49   Dg Chest Port 1 View  10/15/2013   CLINICAL DATA:  Post central line placement. Evaluate for pneumothorax.  EXAM: PORTABLE CHEST - 1 VIEW  COMPARISON:  Earlier same day  FINDINGS: Grossly unchanged enlarged cardiac silhouette and mediastinal contours with atherosclerotic plaque within the thoracic aorta. Interval placement of a left jugular approach intravenous catheter with tip projected over the superior cavoatrial junction. Otherwise, stable position of remaining support apparatus. No pneumothorax. The pulmonary vasculature remains indistinct with cephalization of flow. Interval increase in size of small bilateral effusions with associated worsening bibasilar opacities, left greater than right. Unchanged bones.  IMPRESSION: 1. Interval placement of left jugular approach intravenous catheter with tip projected of the superior cavoatrial junction. Otherwise, stable position of remaining support apparatus. No pneumothorax. 2. Grossly unchanged findings of pulmonary edema with minimal increase in size of bilateral  effusions and associated worsening bibasilar opacities, left greater than right. 3. Cardiomegaly and atherosclerosis.   Electronically Signed   By: Sandi Mariscal M.D.   On: 10/15/2013 18:59   Dg Chest Portable 1 View  10/15/2013   CLINICAL DATA:  Vaginal bleeding.  Unresponsive.  EXAM: PORTABLE CHEST - 1 VIEW  COMPARISON:  None.  FINDINGS: Endotracheal tube tip lies 2.4 cm above the chronic. Nasogastric tube passes below the diaphragm well into the stomach.  Cardiac silhouette is mildly enlarged. No mediastinal or hilar masses. There are prominent bronchovascular markings. No lung consolidation or edema. No pleural effusion or gross pneumothorax on this supine study.  Bony thorax is demineralized but intact.  IMPRESSION: 1. Endotracheal tube and nasogastric tube are well positioned. 2. No acute cardiopulmonary disease.   Electronically Signed   By: Lajean Manes M.D.   On: 10/15/2013 13:47   Dg Abd Portable 1v  10/15/2013   CLINICAL DATA:  NG tube placement  EXAM: PORTABLE ABDOMEN - 1 VIEW  COMPARISON:  None.  FINDINGS:  Enteric tube tip and side port projects over the expected location of the gastric antrum.  There is a paucity of bowel gas without definite evidence of obstruction.  Nondiagnostic evaluation for pneumoperitoneum secondary supine positioning exclusion the lower thorax. No definite pneumatosis or portal venous gas.  Limited visualization of lower thorax suggests small left-sided effusion with associated left basilar opacities. There is exclusion of the caudal aspect of the right hemi thorax.  No acute osseus abnormalities. A catheter overlies the urinary bladder.  IMPRESSION: Enteric tube tip and side port projects over the gastric fundus.   Electronically Signed   By: Sandi Mariscal M.D.   On: 10/15/2013 19:06    PHYSICAL EXAM General: No respiratory distress. Lying in bed.   Neck: JVP 14 cm.  Lungs: LLL crackles.  CV: Nondisplaced PMI.  Heart regular S1/S2, no S3/S4, no murmur.  No peripheral  edema.   Abdomen: Soft, nontender, no hepatosplenomegaly, no distention.  Neurologic: Alert, follows commands Psych: Normal affect. Extremities: No clubbing or cyanosis. L hand erythema noted palmar aspect.  L hand weakness noted.   TELEMETRY: Reviewed telemetry pt in NSR  ASSESSMENT AND PLAN: 63 yo with history of CAD and ischemic cardiomyopathy, chronic CHF with predominantly pulmonary venous hypertension, and CKD stage IV was found down at home and intubated.  She was noted to have probable lower GI bleeding with hemoglobin 6 at admission and pulmonary edema on CXR.   1. Pulmonary: Extubated. Passed swallow evaluation. Suspect pulmonary edema + possible aspiration pneumonitis/PNA.  She is on ceftriaxone. 2. Neuro: Alert and oriented.  3. Acute on chronic systolic CHF: Ischemic cardiomyopathy, EF 30-35%.  She remains volume overloaded.  Think she needs ongoing IV diuresis. - Stop po torsemide, Lasix 40 mg IV every 8 hours.  - Change to Bidil 75/ 40 tid. (has Medicaid).    - Add spironolactone 12.5 mg daily. - Think we can probably resume Coreg 3.125 mg bid tomorrow.  4. CAD: Troponin negative, doubt ACS.  Hold off on ASA for now with low platelets and GI bleeding. She is on home statin.  Hopefully resume aspirin before discharge given known CAD.  5. GI bleeding: Hematochezia, possible lower GI bleeding.  Overt bleeding has resolved. Hemoglobin up to 11.6 after transfusion.  Started with hemoglobin 6.  Today hemoglobin 10.2. Endoscopic evaluations on hold.  On PPI.   6. CKD: Stage IV.  Creatinine remains stable at 1.7 (Baseline (1.8-2 prior).  Will follow closely with diuresis.  7. L hand pain- 2 view XRay ok. Suspect gout.  Uric Acid 15. On Day 2 /3 prednisone.   Add colchicine 0.3 mg daily.   Need to mobilize. Consult cardiac rehab and PT.   CLEGG,AMY NP-C  10/18/2013 9:50 AM  Patient seen with NP, agree with the above note. I think she needs more diuresis, still volume overloaded.   Will restart IV Lasix, follow I/Os closely.  Needs to mobilize, PT consult.   Loralie Champagne 10/18/2013 11:10 AM

## 2013-10-18 NOTE — Progress Notes (Signed)
[facility] PROGRESS NOTE  [name] [name] ONG:[id number] DOB: [birth year] DOA: 10/15/2013 PCP: No primary provider on file.  Assessment/Plan:  Acute respiratory failure -was found with episodes of apnea at home, intubated in the ED, extubated . -resolved by CXR -supplement O2  -continue diuresis  Acute on Chronic Heart Failure -remains volume overloaded- continue diuresis with IV Lasix 40 mg Po q 8 hours.  -Appreciate cardiology-  substitute oral torsemide for IV lasix; hydralazine and spironlactone added. Coreg may be resumed tomorrow -2D echo 9/15 with EF of 30-35% -cardic rehab- 200 ft walk without dyspnea and with good sats on RA  CAD -Cardiology following- double ACS. ASA discontinued due to GIB, may be resumed on discharge  GI bleed - +guiac and hematochezia possible lower GIB. Overt bleeding stopped and hgb 11.6 after 2 units given in ED.  -continue PPI -Appreciate GI consult- no significant GI bled, and no plans for endoscopic evaluations, continue PPI.     Anemia  -no current/history of overt bleeding -In ED hgb of 6, transfused 2 units of PRBC -Appreciate GI- no plans for endoscopy -continue to monitor CBC   CKD, stage IV -creatinine at 1.8. Baseline about  1.8 -continue to follow BMET  L hand pain -CXR- unremarkable - uric acid 15 - On oral prednisone, add colchicine 0.3 mg  DM II -CBGs stable  -continue SSI   Aspiration pneumonitis/PNA -patient intubated due to acute respiratory failure,  Extubate 9/9 -IV rocephin ( day2) empirically for pneumonitis/pna -blood cultures pending -sputum cultures negative   DVT Prophylaxis SCDs Code Status: Full Family Communication: No family at bedside Disposition Plan: Inpatient, home when stable   Consultants:  Gastroenterology  Cardiology  Procedures:  ED echo  Antibiotics:  IV rocephin (day 2)  HPI/Subjective: [name], DM  presented to ED via ambulance with son who aids with  history.  Son states patient with increasing edema recently, and was found unresponsive.  EMS noted several episodes of apnea and used BVM en route.   In ED, patient was intubated and pink frothy secretions were noted.  EKG showed a sinus rhythm, BNP was found to be 23k, CXR was non-diagnostic, she was hypoglycemic, and hemoglobin of 6.  Given D50, 2L of PRBC and admitted for further management  Objective: Filed Vitals:   10/18/13 0948  BP: 157/83  Pulse: 65  Temp: 97.7 F (36.5 C)  Resp: 16    Intake/Output Summary (Last 24 hours) at 10/18/13 1502 Last data filed at 10/18/13 1324  Gross per 24 hour  Intake    890 ml  Output   1000 ml  Net   -110 ml   Filed Weights   10/17/13 0400 10/17/13 1429 10/18/13 0708  Weight: 53 kg (116 lb 13.5 oz) 52.8 kg (116 lb 6.5 oz) 52.8 kg (116 lb 6.5 oz)    Exam:  Gen: Alert and oriented pleasant [name], in NAD HEENT: Normocephalic, atraumatic.  Pupils symmertrical.  Moist mucosa.   Chest: clear to auscultate bilaterally, no ronchi or rales  Cardiac: Regular rate and rhythm, S1-S2, no rubs murmurs or gallops  Abdomen: soft, non tender, non distended, +bowel sounds. No guarding or rigidity  Extremities: Symmetrical in appearance without cyanosis or edema. Left hand-  mild swelling and erythema on palmar aspect. Limited ROM and strength. Neurological: Alert awake oriented to time place and person.  Psychiatric: Appears normal.   Data Reviewed: Basic Metabolic Panel:  Recent Labs Lab 10/15/13 1318 10/16/13 0335 10/16/13 1532  10/17/13 0325 10/18/13 0500  NA 136* 138 139 140 140  K 3.6* 4.0 4.0 3.5* 3.7  CL 99 101 102 103 101  CO2 21 22 22 22 22   GLUCOSE 34* 82 70 112* 137*  BUN 74* 73* 73* 74* 73*  CREATININE 1.62* 1.73* 1.70* 1.71* 1.88*  CALCIUM 8.1* 8.2* 8.1* 8.1* 8.4   Liver Function Tests: No results found for this basename: AST, ALT, ALKPHOS, BILITOT, PROT, ALBUMIN,  in the last 168 hours No results found for this  basename: LIPASE, AMYLASE,  in the last 168 hours No results found for this basename: AMMONIA,  in the last 168 hours CBC:  Recent Labs Lab 10/15/13 1318 10/16/13 0335 10/17/13 0325 10/18/13 0500  WBC 5.5 7.1 5.3 6.3  NEUTROABS 4.6  --   --   --   HGB 6.0* 11.6* 10.2* 10.0*  HCT 17.8* 34.0* 30.9* 30.4*  MCV 89.9 89.0 91.4 92.1  PLT 91* 63* 58* 58*   Cardiac Enzymes:  Recent Labs Lab 10/15/13 1318 10/16/13 0330  TROPONINI <0.30 <0.30   BNP (last 3 results)  Recent Labs  10/15/13 1318  PROBNP 24313.0*   CBG:  Recent Labs Lab 10/16/13 1923 10/17/13 0757 10/17/13 2124 10/18/13 0559 10/18/13 1124  GLUCAP 81 94 198* 137* 235*    Recent Results (from the past 240 hour(s))  CULTURE, BLOOD (ROUTINE X 2)     Status: None   Collection Time    10/15/13  2:15 PM      Result Value Ref Range Status   Specimen Description BLOOD LEFT ANTECUBITAL   Final   Special Requests BOTTLES DRAWN AEROBIC AND ANAEROBIC 5CC   Final   Culture  Setup Time     Final   Value: 10/15/2013 18:51     Performed at [facility]   Culture     Final   Value:        BLOOD CULTURE RECEIVED NO GROWTH TO DATE CULTURE WILL BE HELD FOR 5 DAYS BEFORE ISSUING A FINAL NEGATIVE REPORT     Performed at [facility]   Report Status PENDING   Incomplete  CULTURE, BLOOD (ROUTINE X 2)     Status: None   Collection Time    10/15/13  2:30 PM      Result Value Ref Range Status   Specimen Description BLOOD HAND RIGHT   Final   Special Requests BOTTLES DRAWN AEROBIC AND ANAEROBIC 5CC   Final   Culture  Setup Time     Final   Value: 10/15/2013 18:51     Performed at [facility]   Culture     Final   Value:        BLOOD CULTURE RECEIVED NO GROWTH TO DATE CULTURE WILL BE HELD FOR 5 DAYS BEFORE ISSUING A FINAL NEGATIVE REPORT     Performed at [facility]   Report Status PENDING   Incomplete  MRSA PCR SCREENING     Status: None   Collection Time    10/15/13  4:43 PM       Result Value Ref Range Status   MRSA by PCR NEGATIVE  NEGATIVE Final   Comment:            The GeneXpert MRSA Assay (FDA     approved for NASAL specimens     only), is one component of a     comprehensive MRSA colonization     surveillance program. It is not  intended to diagnose MRSA     infection nor to guide or     monitor treatment for     MRSA infections.  CULTURE, RESPIRATORY (NON-EXPECTORATED)     Status: None   Collection Time    10/15/13  8:05 PM      Result Value Ref Range Status   Specimen Description TRACHEAL ASPIRATE   Final   Special Requests NONE   Final   Gram Stain     Final   Value: FEW WBC PRESENT,BOTH PMN AND MONONUCLEAR     RARE SQUAMOUS EPITHELIAL CELLS PRESENT     RARE GRAM POSITIVE COCCI     IN PAIRS IN CLUSTERS     Performed at [facility]   Culture     Final   Value: Non-Pathogenic Oropharyngeal-type Flora Isolated.     Performed at [facility]   Report Status 10/18/2013 FINAL   Final     Studies: Dg Wrist Complete Left  11/07/13   CLINICAL DATA:  Pain and swelling.  Possible injury  EXAM: LEFT WRIST - COMPLETE 3+ VIEW  COMPARISON:  None.  FINDINGS: There is no evidence of fracture or dislocation. There is no evidence of arthropathy or other focal bone abnormality. Soft tissues are unremarkable.  IMPRESSION: Negative.   Electronically Signed   By: Franchot Gallo M.D.   On: 11-07-13 08:05   Dg Chest Port 1 View  November 07, 2013   CLINICAL DATA:  Followup respiratory failure  EXAM: PORTABLE CHEST - 1 VIEW  COMPARISON:  Prior chest x-ray 10/16/2013  FINDINGS: Extensive metallic artifact projects over the chest in the formal of the numerous coiled cardiac leads. Stable enlargement of the cardiopericardial silhouette. Atherosclerotic calcifications noted in the transverse aorta. The patient has been extubated and both the left IJ central venous catheter and nasogastric tube have been removed. Inspiratory volumes are slightly lower and there  is slightly increased left basilar atelectasis. Pulmonary vascular congestion has improved. No pneumothorax. Probable small bilateral layering pleural effusions. No acute osseous abnormality.  IMPRESSION: 1. Interval extubation, removal of nasogastric tube and removal of left IJ central venous catheter. 2. Interval resolution of pulmonary vascular congestion. 3. Slightly increased left basilar atelectasis. 4. Trace bilateral pleural effusions.   Electronically Signed   By: Jacqulynn Cadet M.D.   On: 2013-11-07 07:45   Dg Hand Complete Left  2013/11/07   CLINICAL DATA:  Pain and swelling.  Question injury  EXAM: LEFT HAND - COMPLETE 3+ VIEW  COMPARISON:  None.  FINDINGS: There is no evidence of fracture or dislocation. There is no evidence of arthropathy or other focal bone abnormality. Soft tissues are unremarkable.  IMPRESSION: Negative.   Electronically Signed   By: Franchot Gallo M.D.   On: November 07, 2013 08:04    Scheduled Meds: . atorvastatin  10 mg Oral q1800  . cefTRIAXone (ROCEPHIN)  IV  1 g Intravenous Q24H  . colchicine  0.3 mg Oral Daily  . furosemide  40 mg Intravenous Q8H  . isosorbide-hydrALAZINE  2 tablet Oral TID  . predniSONE  40 mg Oral Q breakfast  . spironolactone  12.5 mg Oral Daily   Continuous Infusions:   Active Problems:   Acute respiratory failure   Chronic systolic heart failure   Pulmonary hypertension   Pulmonary edema   Acute gout    Time spent: 35 min    Lacy Duverney [facility]  [facility] Pager [phone]. If 7PM-7AM, please contact night-coverage at [email or web address], password [facility] 10/18/2013, 3:02 PM  LOS: 3 days   Addendum  I personally saw and evaluated patient on 10/18/2013 and agree with the above findings. Patient transferred to telemetry yesterday. This morning she appears to be doing little better, we ambulated down the hallway to the nurses station and back. Clinic continue Lasix 40 mg IV 3 times a day, continue monitor volume status, reassess in  a.m.

## 2013-10-18 NOTE — Care Management Note (Addendum)
  Page 2 of 2   10/21/2013     3:45:30 PM CARE MANAGEMENT NOTE 10/21/2013  Patient:  [name] [name], [name] [name]   Account Number:  [id number]  Date Initiated:  10/16/2013  Documentation initiated by:  [facility]  Subjective/Objective Assessment:   Found down, intubated - Pulm Edema     Action/Plan:   Anticipated DC Date:  10/23/2013   Anticipated DC Plan:  [place]  CM consult  Follow-up appt scheduled  PCP issues      Choice offered to / List presented to:  NA           Status of service:  Completed, signed off Medicare Important Message given?  YES (If response is "NO", the following Medicare IM given date fields will be blank) Date Medicare IM given:  10/21/2013 Medicare IM given by:  Kaytlen Lightsey Date Additional Medicare IM given:   Additional Medicare IM given by:    Discharge Disposition:  HOME/SELF CARE  Per UR Regulation:  Reviewed for med. necessity/level of care/duration of stay  If discussed at [place] of Stay Meetings, dates discussed:   10/22/2013    Comments:  Mariann Laster RN, BSN, MSHL, CCM  Nurse - Case Manager,  ([facility])  [phone]  10/21/2013 IM:  N/a - 63yo with MCD Social:  From home with DTR PCP:  Contact:  [place]/ Son [phone] and [place]/ Daughter [phone] PT RECS:  None PCP Needed:  CM attempted [facility] but no location near to patient. CM contacted [facility] but not accepting MCD patients in 11 years d/t max. CM contacted [facility] who instructed per appt protocal: Instructe patient in the following: Follow up with [facility] . (New Primary Care MD appoint:  Patient to contact office on 10/22/2013 starting at 9:am to get appt for next week.  ) [place] [phone] Dispositon Plan: Home / Self care  10-16-13 9:15am Luz Lex, [place] [phone] ?? social support at home - readmission - may need ST SNF?? SW consult  placed.

## 2013-10-18 NOTE — Progress Notes (Signed)
CARDIAC REHAB PHASE I   PRE:  Rate/Rhythm: 68 SR  BP:  Supine: 156/84  Sitting:   Standing:    SaO2: 97%RA  MODE:  Ambulation: 200 ft   POST:  Rate/Rhythm: 69  BP:  Supine: 166/84  Sitting:   Standing:    SaO2: 95%RA 1340-1422 Assisted to [facility] prior to walk. Pt walked 200 ft on RA with gait belt use, rolling walker and asst x 1 with fairly steady gait. Not dyspneic. Sats good on RA. Pt stated she had sick heart. To bed after walk with bed alarm on.   Graylon Good, RN BSN  10/18/2013 2:20 PM

## 2013-10-19 DIAGNOSIS — I5033 Acute on chronic diastolic (congestive) heart failure: Secondary | ICD-10-CM

## 2013-10-19 LAB — CBC
HEMATOCRIT: 29.8 % — AB (ref 36.0–46.0)
Hemoglobin: 9.9 g/dL — ABNORMAL LOW (ref 12.0–15.0)
MCH: 29.7 pg (ref 26.0–34.0)
MCHC: 33.2 g/dL (ref 30.0–36.0)
MCV: 89.5 fL (ref 78.0–100.0)
Platelets: 54 10*3/uL — ABNORMAL LOW (ref 150–400)
RBC: 3.33 MIL/uL — ABNORMAL LOW (ref 3.87–5.11)
RDW: 16.6 % — ABNORMAL HIGH (ref 11.5–15.5)
WBC: 4.5 10*3/uL (ref 4.0–10.5)

## 2013-10-19 LAB — GLUCOSE, CAPILLARY
GLUCOSE-CAPILLARY: 226 mg/dL — AB (ref 70–99)
GLUCOSE-CAPILLARY: 263 mg/dL — AB (ref 70–99)
Glucose-Capillary: 188 mg/dL — ABNORMAL HIGH (ref 70–99)
Glucose-Capillary: 277 mg/dL — ABNORMAL HIGH (ref 70–99)

## 2013-10-19 LAB — BASIC METABOLIC PANEL
Anion gap: 14 (ref 5–15)
BUN: 73 mg/dL — ABNORMAL HIGH (ref 6–23)
CO2: 22 mEq/L (ref 19–32)
Calcium: 8.6 mg/dL (ref 8.4–10.5)
Chloride: 101 mEq/L (ref 96–112)
Creatinine, Ser: 1.76 mg/dL — ABNORMAL HIGH (ref 0.50–1.10)
GFR calc Af Amer: 34 mL/min — ABNORMAL LOW (ref >90)
GFR calc non Af Amer: 30 mL/min — ABNORMAL LOW (ref >90)
Glucose, Bld: 196 mg/dL — ABNORMAL HIGH (ref 70–99)
Potassium: 3.9 mEq/L (ref 3.7–5.3)
Sodium: 137 mEq/L (ref 137–147)

## 2013-10-19 MED ORDER — GLIPIZIDE ER 2.5 MG PO TB24
2.5000 mg | ORAL_TABLET | Freq: Every day | ORAL | Status: DC
  Administered 2013-10-19 – 2013-10-21 (×3): 2.5 mg via ORAL
  Filled 2013-10-19 (×4): qty 1

## 2013-10-19 MED ORDER — CARVEDILOL 3.125 MG PO TABS
3.1250 mg | ORAL_TABLET | Freq: Two times a day (BID) | ORAL | Status: DC
  Filled 2013-10-19 (×2): qty 1

## 2013-10-19 MED ORDER — CARVEDILOL 3.125 MG PO TABS
3.1250 mg | ORAL_TABLET | Freq: Two times a day (BID) | ORAL | Status: DC
  Administered 2013-10-19 – 2013-10-20 (×3): 3.125 mg via ORAL
  Filled 2013-10-19 (×5): qty 1

## 2013-10-19 NOTE — Evaluation (Signed)
Physical Therapy Evaluation Patient Details Name: [name] [name] MRN: [id number] DOB: [birth year] Today's Date: 10/19/2013   History of Present Illness  63 y.o. female admitted with acute respiratory failure.  Clinical Impression  Pt admitted with above. Pt currently with functional limitations due to the deficits listed below (see PT Problem List).  Pt will benefit from skilled PT to increase their independence and safety with mobility to allow discharge to the venue listed below. Recommending d/c home with cardiac rehab and PRN assist from family/friends.  If no social support at home, she may need ST SNF for continued therapy.      Follow Up Recommendations Other (comment) (cardiac rehab)    Equipment Recommendations  None recommended by PT    Recommendations for Other Services       Precautions / Restrictions Precautions Precautions: Fall      Mobility  Bed Mobility Overal bed mobility: Needs Assistance Bed Mobility: Supine to Sit;Sit to Supine     Supine to sit: Min guard Sit to supine: Min guard      Transfers Overall transfer level: Needs assistance Equipment used: None Transfers: Sit to/from [facility] Sit to Stand: Min assist Stand pivot transfers: Min assist          Ambulation/Gait Ambulation/Gait assistance: Min assist Ambulation Distance (Feet): 10 Feet Assistive device: None Gait Pattern/deviations: Decreased stride length Gait velocity: decreased   General Gait Details: pt declined increased gait distance. Per chart, pt has been ambulatiing >200 ft using RW with cardiac rehab.  Stairs            Wheelchair Mobility    Modified Rankin (Stroke Patients Only)       Balance                                             Pertinent Vitals/Pain Pain Assessment: No/denies pain    Home Living Family/patient expects to be discharged to:: Private residence Living Arrangements: Alone Available Help at  Discharge: Family;Available PRN/intermittently Type of Home: Apartment       Home Layout: One level Home Equipment: None      Prior Function Level of Independence: Independent               Hand Dominance        Extremity/Trunk Assessment   Upper Extremity Assessment: Generalized weakness           Lower Extremity Assessment: Generalized weakness         Communication   Communication: Prefers language other than English  Cognition Arousal/Alertness: Awake/alert Behavior During Therapy: WFL for tasks assessed/performed Overall Cognitive Status: Difficult to assess                      General Comments      Exercises        Assessment/Plan    PT Assessment Patient needs continued PT services  PT Diagnosis Difficulty walking;Generalized weakness   PT Problem List Decreased strength;Decreased activity tolerance;Decreased balance;Decreased mobility;Decreased knowledge of precautions;Decreased safety awareness  PT Treatment Interventions DME instruction;Gait training;Stair training;Functional mobility training;Therapeutic activities;Therapeutic exercise;Patient/family education;Balance training   PT Goals (Current goals can be found in the Care Plan section) Acute Rehab PT Goals Patient Stated Goal: not stated PT Goal Formulation: With patient Time For Goal Achievement: 11/02/13 Potential to Achieve Goals: Good    Frequency Min 3X/week  Barriers to discharge Decreased caregiver support      Co-evaluation               End of Session Equipment Utilized During Treatment: Gait belt Activity Tolerance: Patient tolerated treatment well Patient left: in chair;with call bell/phone within reach;with chair alarm set Nurse Communication: Mobility status         Time: 4944-9675 PT Time Calculation (min): 16 min   Charges:   PT Evaluation $Initial PT Evaluation Tier I: 1 Procedure PT Treatments $Therapeutic Activity: 8-22 mins    PT G Codes:          Lorriane Shire 10/19/2013, 1:48 PM

## 2013-10-19 NOTE — Progress Notes (Signed)
[facility] PROGRESS NOTE  [name] [name] DTO:[id number] DOB: 09/19/1950 DOA: 10/15/2013 PCP: No primary provider on file.  Assessment/Plan:  Acute respiratory failure -was found with episodes of apnea at home, intubated in the ED, extubated . -resolved by CXR -supplement O2  -continue lasix 40 mg IV q 8 hours  Acute on Chronic Heart Failure -remains volume overloaded- continue diuresis with IV Lasix 40 mg Po q 8 hours, likely transition to oral Demadex in am  -Bidil increased to 75/40, added spironolactone and coreg -2D echo 9/15 with EF of 30-35% -cardic rehab- 200 ft walk without dyspnea and with good sats on RA  CAD -Cardiology following- double ACS. ASA discontinued due to GIB, may be resumed on discharge  GI bleed - +guiac and hematochezia possible lower GIB. Overt bleeding stopped and hgb 11.6 after 2 units given in ED.  -continue PPI -Appreciate GI consult- no significant GI bled, and no plans for endoscopic evaluations, continue PPI.     Anemia  -no current/history of overt bleeding -In ED hgb of 6, transfused 2 units of PRBC -Appreciate GI- no plans for endoscopy -continue to monitor CBC   CKD, stage IV -creatinine at 1.8. Baseline about  1.8 -continue to follow BMET  L hand pain -CXR- unremarkable - uric acid 15 - On oral prednisone, add colchicine 0.3 mg  DM II -CBGs stable  -continue SSI   Aspiration pneumonitis/PNA -patient intubated due to acute respiratory failure,  Extubate 9/9 -IV rocephin ( day2) empirically for pneumonitis/pna -blood cultures pending -sputum cultures negative   DVT Prophylaxis SCDs Code Status: Full Family Communication: No family at bedside Disposition Plan: Inpatient, home when stable   Consultants:  Gastroenterology  Cardiology  Procedures:  ED echo  Antibiotics:  IV rocephin (day 2)  HPI/Subjective: [name], DM  presented to ED via ambulance with son who aids with history.  Son states  patient with increasing edema recently, and was found unresponsive.  EMS noted several episodes of apnea and used BVM en route.   In ED, patient was intubated and pink frothy secretions were noted.  EKG showed a sinus rhythm, BNP was found to be 23k, CXR was non-diagnostic, she was hypoglycemic, and hemoglobin of 6.  Given D50, 2L of PRBC and admitted for further management  Objective: Filed Vitals:   10/19/13 1356  BP: 154/73  Pulse: 70  Temp: 97.4 F (36.3 C)  Resp: 20    Intake/Output Summary (Last 24 hours) at 10/19/13 1403 Last data filed at 10/19/13 1300  Gross per 24 hour  Intake    340 ml  Output    851 ml  Net   -511 ml   Filed Weights   10/17/13 1429 10/18/13 0708 10/19/13 0642  Weight: 52.8 kg (116 lb 6.5 oz) 52.8 kg (116 lb 6.5 oz) 52.7 kg (116 lb 2.9 oz)    Exam:  Gen: Alert and oriented pleasant [name], in NAD HEENT: Normocephalic, atraumatic.  Pupils symmertrical.  Moist mucosa.   Chest: clear to auscultate bilaterally, no ronchi or rales  Cardiac: Regular rate and rhythm, S1-S2, no rubs murmurs or gallops  Abdomen: soft, non tender, non distended, +bowel sounds. No guarding or rigidity  Extremities: Symmetrical in appearance without cyanosis or edema. Left hand-  mild swelling and erythema on palmar aspect. Limited ROM and strength. Neurological: Alert awake oriented to time place and person.  Psychiatric: Appears normal.   Data Reviewed: Basic Metabolic Panel:  Recent Labs Lab 10/16/13 0335  10/16/13 1532 10/17/13 0325 10/18/13 0500 10/19/13 0555  NA 138 139 140 140 137  K 4.0 4.0 3.5* 3.7 3.9  CL 101 102 103 101 101  CO2 22 22 22 22 22   GLUCOSE 82 70 112* 137* 196*  BUN 73* 73* 74* 73* 73*  CREATININE 1.73* 1.70* 1.71* 1.88* 1.76*  CALCIUM 8.2* 8.1* 8.1* 8.4 8.6   Liver Function Tests: No results found for this basename: AST, ALT, ALKPHOS, BILITOT, PROT, ALBUMIN,  in the last 168 hours No results found for this basename: LIPASE,  AMYLASE,  in the last 168 hours No results found for this basename: AMMONIA,  in the last 168 hours CBC:  Recent Labs Lab 10/15/13 1318 10/16/13 0335 10/17/13 0325 10/18/13 0500 10/19/13 0555  WBC 5.5 7.1 5.3 6.3 4.5  NEUTROABS 4.6  --   --   --   --   HGB 6.0* 11.6* 10.2* 10.0* 9.9*  HCT 17.8* 34.0* 30.9* 30.4* 29.8*  MCV 89.9 89.0 91.4 92.1 89.5  PLT 91* 63* 58* 58* 54*   Cardiac Enzymes:  Recent Labs Lab 10/15/13 1318 10/16/13 0330  TROPONINI <0.30 <0.30   BNP (last 3 results)  Recent Labs  10/15/13 1318  PROBNP 24313.0*   CBG:  Recent Labs Lab 10/18/13 0559 10/18/13 1124 10/18/13 2055 10/19/13 0636 10/19/13 1106  GLUCAP 137* 235* 344* 188* 277*    Recent Results (from the past 240 hour(s))  CULTURE, BLOOD (ROUTINE X 2)     Status: None   Collection Time    10/15/13  2:15 PM      Result Value Ref Range Status   Specimen Description BLOOD LEFT ANTECUBITAL   Final   Special Requests BOTTLES DRAWN AEROBIC AND ANAEROBIC 5CC   Final   Culture  Setup Time     Final   Value: 10/15/2013 18:51     Performed at [facility]   Culture     Final   Value:        BLOOD CULTURE RECEIVED NO GROWTH TO DATE CULTURE WILL BE HELD FOR 5 DAYS BEFORE ISSUING A FINAL NEGATIVE REPORT     Performed at [facility]   Report Status PENDING   Incomplete  CULTURE, BLOOD (ROUTINE X 2)     Status: None   Collection Time    10/15/13  2:30 PM      Result Value Ref Range Status   Specimen Description BLOOD HAND RIGHT   Final   Special Requests BOTTLES DRAWN AEROBIC AND ANAEROBIC 5CC   Final   Culture  Setup Time     Final   Value: 10/15/2013 18:51     Performed at [facility]   Culture     Final   Value:        BLOOD CULTURE RECEIVED NO GROWTH TO DATE CULTURE WILL BE HELD FOR 5 DAYS BEFORE ISSUING A FINAL NEGATIVE REPORT     Performed at [facility]   Report Status PENDING   Incomplete  MRSA PCR SCREENING     Status: None   Collection Time     10/15/13  4:43 PM      Result Value Ref Range Status   MRSA by PCR NEGATIVE  NEGATIVE Final   Comment:            The GeneXpert MRSA Assay (FDA     approved for NASAL specimens     only), is one component of a     comprehensive  MRSA colonization     surveillance program. It is not     intended to diagnose MRSA     infection nor to guide or     monitor treatment for     MRSA infections.  CULTURE, RESPIRATORY (NON-EXPECTORATED)     Status: None   Collection Time    10/15/13  8:05 PM      Result Value Ref Range Status   Specimen Description TRACHEAL ASPIRATE   Final   Special Requests NONE   Final   Gram Stain     Final   Value: FEW WBC PRESENT,BOTH PMN AND MONONUCLEAR     RARE SQUAMOUS EPITHELIAL CELLS PRESENT     RARE GRAM POSITIVE COCCI     IN PAIRS IN CLUSTERS     Performed at [facility]   Culture     Final   Value: Non-Pathogenic Oropharyngeal-type Flora Isolated.     Performed at [facility]   Report Status 10/18/2013 FINAL   Final     Studies: No results found.  Scheduled Meds: . atorvastatin  10 mg Oral q1800  . carvedilol  3.125 mg Oral BID WC  . colchicine  0.3 mg Oral Daily  . furosemide  40 mg Intravenous Q8H  . glipiZIDE  2.5 mg Oral Q breakfast  . insulin aspart  0-5 Units Subcutaneous QHS  . insulin aspart  0-9 Units Subcutaneous TID WC  . isosorbide-hydrALAZINE  2 tablet Oral TID  . predniSONE  40 mg Oral Q breakfast  . spironolactone  12.5 mg Oral Daily   Continuous Infusions:   Active Problems:   Acute respiratory failure   Chronic systolic heart failure   Pulmonary hypertension   Pulmonary edema   Acute gout    Time spent: 25 min    Lacy Duverney [facility]  [facility] Pager [phone]. If 7PM-7AM, please contact night-coverage at [email or web address], password [facility] 10/19/2013, 2:03 PM  LOS: 4 days

## 2013-10-19 NOTE — Progress Notes (Signed)
CARDIAC REHAB PHASE I   PRE:  Rate/Rhythm: 70 sinus  BP:  Supine:   Sitting: 116/66  Standing:    SaO2:   MODE:  Ambulation: 400 ft   POST:  Rate/Rhythem: 87 sinus  BP:  Supine:   Sitting: 108/60  Standing:    SaO2:   Pt ambulated 400 ft with assist x1 using rolling walker.  Pt tolerated walk well without complaints.  Pt encouraged to walk with staff over weekend.  We will f/u on Monday. Alberteen Sam, MA, ACSM RCEP [phone]  Clotilde Dieter

## 2013-10-19 NOTE — Progress Notes (Signed)
    Subjective:  No significant shortness of breath. He continues to be 116 pounds. Hand pain has improved Abdomen is still protuberant with fluid.  Objective:  Vital Signs in the last 24 hours: Temp:  [97.3 F (36.3 C)-98 F (36.7 C)] 97.6 F (36.4 C) (09/12 0642) Pulse Rate:  [68-85] 68 (09/12 0642) Resp:  [18] 18 (09/12 0642) BP: (148-168)/(75-94) 160/75 mmHg (09/12 0642) SpO2:  [97 %-100 %] 100 % (09/12 0642) Weight:  [116 lb 2.9 oz (52.7 kg)] 116 lb 2.9 oz (52.7 kg) (09/12 0642)  Intake/Output from previous day: 09/11 0701 - 09/12 0700 In: 5 [P.O.:720; IV Piggyback:50] Out: 700 [Urine:700]   Physical Exam: General: Well developed, well nourished, in no acute distress. Head:  Normocephalic and atraumatic. Moderate JVP. Left neck dressing in place. Lungs: Clear to auscultation and percussion. No crackles today Heart: Normal S1 and S2.  No murmur, rubs or gallops.  Abdomen: soft, non-tender, positive bowel sounds. Probuerant.  Extremities: No clubbing or cyanosis. No edema. Neurologic: Alert and oriented x 3.    Lab Results:  Recent Labs  10/18/13 0500 10/19/13 0555  WBC 6.3 4.5  HGB 10.0* 9.9*  PLT 58* 54*    Recent Labs  10/18/13 0500 10/19/13 0555  NA 140 137  K 3.7 3.9  CL 101 101  CO2 22 22  GLUCOSE 137* 196*  BUN 73* 73*  CREATININE 1.88* 1.76*  Personally viewed.   Telemetry:  Personally viewed.   EKG:  Normal sinus rhythm, 62, first degree AV block, nonspecific ST-T wave changes  Cardiac Studies:  ECHO EF 30-35% Akinesis of the mid-apicalinferolateral and inferior myocardium. Akinesis of the mid-apicalanterior, anterolateral, and apical myocardium.   Scheduled Meds: . atorvastatin  10 mg Oral q1800  . cefTRIAXone (ROCEPHIN)  IV  1 g Intravenous Q24H  . colchicine  0.3 mg Oral Daily  . furosemide  40 mg Intravenous Q8H  . glipiZIDE  2.5 mg Oral Q breakfast  . insulin aspart  0-5 Units Subcutaneous QHS  . insulin aspart  0-9 Units  Subcutaneous TID WC  . isosorbide-hydrALAZINE  2 tablet Oral TID  . predniSONE  40 mg Oral Q breakfast  . spironolactone  12.5 mg Oral Daily   Continuous Infusions:  PRN Meds:.acetaminophen  Assessment/Plan:   63 year old with coronary artery disease history of ischemic cardiomyopathy, with acute on chronic systolic heart failure, pulmonary venous hypertension, COPD stage IV originally was found down at home and intubated. Probable lower GI bleeding with hemoglobin at 6 admission and pulmonary edema on chest x-ray.  1. Acute on chronic systolic heart failure-ischemic cardiomyopathy ejection fraction 30-35%.  - Continue Lasix IV 40 mg every 8 hours  - Tomorrow may be able to change Demadex (home dose 40 twice a day).  - Bidil has been increased to 75/40 TID (has Medicaid)  - Spironolactone 12.5 mg has been added. Potassium stable.  - Adding Carvedilol 3.125 mg twice a day. Titrate up tomorrow if possible.  2. Chronic kidney disease stage IV-creatinine stable 1.7. His previous baseline is 1.8-2.  3. GI bleed-hematochezia has resolved. Hemoglobin 11.6 after infusion after starting at 6. Endoscopic evaluations on hold. Proton pump inhibitor.  4. Left hand pain-x-ray okay. Gout. Colchicine 0.3 daily. Finishing up prednisone. 40 mg.   Rechelle Niebla, [place] 10/19/2013, 11:26 AM

## 2013-10-20 ENCOUNTER — Inpatient Hospital Stay (HOSPITAL_COMMUNITY): Payer: Medicaid Other

## 2013-10-20 LAB — GLUCOSE, CAPILLARY
Glucose-Capillary: 162 mg/dL — ABNORMAL HIGH (ref 70–99)
Glucose-Capillary: 223 mg/dL — ABNORMAL HIGH (ref 70–99)
Glucose-Capillary: 222 mg/dL — ABNORMAL HIGH (ref 70–99)
Glucose-Capillary: 294 mg/dL — ABNORMAL HIGH (ref 70–99)

## 2013-10-20 LAB — BASIC METABOLIC PANEL
Anion gap: 15 (ref 5–15)
BUN: 75 mg/dL — AB (ref 6–23)
CO2: 22 meq/L (ref 19–32)
CREATININE: 1.75 mg/dL — AB (ref 0.50–1.10)
Calcium: 8.7 mg/dL (ref 8.4–10.5)
Chloride: 98 mEq/L (ref 96–112)
GFR calc Af Amer: 35 mL/min — ABNORMAL LOW (ref >90)
GFR calc non Af Amer: 30 mL/min — ABNORMAL LOW (ref >90)
GLUCOSE: 149 mg/dL — AB (ref 70–99)
POTASSIUM: 3.7 meq/L (ref 3.7–5.3)
Sodium: 135 mEq/L — ABNORMAL LOW (ref 137–147)

## 2013-10-20 LAB — CBC
HCT: 30.2 % — ABNORMAL LOW (ref 36.0–46.0)
HEMOGLOBIN: 10 g/dL — AB (ref 12.0–15.0)
MCH: 29.7 pg (ref 26.0–34.0)
MCHC: 33.1 g/dL (ref 30.0–36.0)
MCV: 89.6 fL (ref 78.0–100.0)
Platelets: 71 10*3/uL — ABNORMAL LOW (ref 150–400)
RBC: 3.37 MIL/uL — AB (ref 3.87–5.11)
RDW: 16.6 % — ABNORMAL HIGH (ref 11.5–15.5)
WBC: 4.3 10*3/uL (ref 4.0–10.5)

## 2013-10-20 MED ORDER — TORSEMIDE 20 MG PO TABS
40.0000 mg | ORAL_TABLET | Freq: Two times a day (BID) | ORAL | Status: DC
  Administered 2013-10-20 – 2013-10-21 (×3): 40 mg via ORAL
  Filled 2013-10-20 (×4): qty 2

## 2013-10-20 MED ORDER — DOCUSATE SODIUM 100 MG PO CAPS
100.0000 mg | ORAL_CAPSULE | Freq: Two times a day (BID) | ORAL | Status: DC
  Administered 2013-10-20 – 2013-10-21 (×2): 100 mg via ORAL
  Filled 2013-10-20 (×3): qty 1

## 2013-10-20 MED ORDER — CARVEDILOL 6.25 MG PO TABS
6.2500 mg | ORAL_TABLET | Freq: Two times a day (BID) | ORAL | Status: DC
  Administered 2013-10-20 – 2013-10-21 (×2): 6.25 mg via ORAL
  Filled 2013-10-20 (×4): qty 1

## 2013-10-20 MED ORDER — DOCUSATE SODIUM 100 MG PO CAPS
100.0000 mg | ORAL_CAPSULE | Freq: Every day | ORAL | Status: DC
  Administered 2013-10-20: 100 mg via ORAL
  Filled 2013-10-20: qty 1

## 2013-10-20 MED ORDER — LACTULOSE 10 GM/15ML PO SOLN
20.0000 g | Freq: Every day | ORAL | Status: DC | PRN
  Administered 2013-10-21: 20 g via ORAL
  Filled 2013-10-20: qty 30

## 2013-10-20 NOTE — Progress Notes (Signed)
Pressure dressing to left neck changed cleaned and mepalex placed on site. Bruising and small hard area noted. Will continue to monitor site. Jimmie Molly, RN

## 2013-10-20 NOTE — Progress Notes (Signed)
[facility] PROGRESS NOTE  [name] [name] WPV:[id number] DOB: Jul 06, 1950 DOA: 10/15/2013 PCP: No primary provider on file.  Assessment/Plan:  Acute respiratory failure -was found with episodes of apnea at home, intubated in the ED, extubated . -resolved by CXR -supplement O2  -She was transitioned to Demadex 40 mg BID today  Acute on Chronic Heart Failure -She continues to show daily improvement, transitioned to Demadex 40 mg PO BID today -Bidil increased to 75/40, added spironolactone and coreg -2D echo 9/15 with EF of 30-35% -cardic rehab- 200 ft walk without dyspnea and with good sats on RA  CAD -Cardiology following- double ACS. ASA discontinued due to GIB, may be resumed on discharge  GI bleed - +guiac and hematochezia possible lower GIB. Overt bleeding stopped and hgb 11.6 after 2 units given in ED.  -continue PPI -Appreciate GI consult- no significant GI bled, and no plans for endoscopic evaluations, continue PPI.     Anemia  -no current/history of overt bleeding -In ED hgb of 6, transfused 2 units of PRBC -Appreciate GI- no plans for endoscopy -continue to monitor CBC   CKD, stage IV -creatinine at 1.8. Baseline about  1.8 -continue to follow BMET  L hand pain -CXR- unremarkable - uric acid 15 - On oral prednisone, add colchicine 0.3 mg  DM II -CBGs stable  -continue SSI   Aspiration pneumonitis/PNA -patient intubated due to acute respiratory failure,  Extubate 9/9 -IV rocephin ( day2) empirically for pneumonitis/pna -blood cultures pending -sputum cultures negative   DVT Prophylaxis SCDs Code Status: Full Family Communication: No family at bedside Disposition Plan: Anticipate discharge in the next 24 hours   Consultants:  Gastroenterology  Cardiology  Procedures:  ED echo  Antibiotics:  IV rocephin (day 2)  HPI/Subjective: [name], DM  presented to ED via ambulance with son who aids with history.  Son states patient  with increasing edema recently, and was found unresponsive.  EMS noted several episodes of apnea and used BVM en route.   In ED, patient was intubated and pink frothy secretions were noted.  EKG showed a sinus rhythm, BNP was found to be 23k, CXR was non-diagnostic, she was hypoglycemic, and hemoglobin of 6.  Given D50, 2L of PRBC and admitted for further management  Objective: Filed Vitals:   10/20/13 1558  BP: 155/88  Pulse: 67  Temp:   Resp:     Intake/Output Summary (Last 24 hours) at 10/20/13 1605 Last data filed at 10/20/13 1359  Gross per 24 hour  Intake    720 ml  Output   1100 ml  Net   -380 ml   Filed Weights   10/18/13 0708 10/19/13 0642 10/20/13 0629  Weight: 52.8 kg (116 lb 6.5 oz) 52.7 kg (116 lb 2.9 oz) 52.6 kg (115 lb 15.4 oz)    Exam:  Gen: Alert and oriented pleasant [name], in NAD HEENT: Normocephalic, atraumatic.  Pupils symmertrical.  Moist mucosa.   Chest: clear to auscultate bilaterally, no ronchi or rales  Cardiac: Regular rate and rhythm, S1-S2, no rubs murmurs or gallops  Abdomen: soft, non tender, non distended, +bowel sounds. No guarding or rigidity  Extremities: Symmetrical in appearance without cyanosis or edema. Left hand-  mild swelling and erythema on palmar aspect. Limited ROM and strength. Neurological: Alert awake oriented to time place and person.  Psychiatric: Appears normal.   Data Reviewed: Basic Metabolic Panel:  Recent Labs Lab 10/16/13 1532 10/17/13 0325 10/18/13 0500 10/19/13 0555 10/20/13 7482  NA 139 140 140 137 135*  K 4.0 3.5* 3.7 3.9 3.7  CL 102 103 101 101 98  CO2 22 22 22 22 22   GLUCOSE 70 112* 137* 196* 149*  BUN 73* 74* 73* 73* 75*  CREATININE 1.70* 1.71* 1.88* 1.76* 1.75*  CALCIUM 8.1* 8.1* 8.4 8.6 8.7   Liver Function Tests: No results found for this basename: AST, ALT, ALKPHOS, BILITOT, PROT, ALBUMIN,  in the last 168 hours No results found for this basename: LIPASE, AMYLASE,  in the last 168  hours No results found for this basename: AMMONIA,  in the last 168 hours CBC:  Recent Labs Lab 10/15/13 1318 10/16/13 0335 10/17/13 0325 10/18/13 0500 10/19/13 0555 10/20/13 0755  WBC 5.5 7.1 5.3 6.3 4.5 4.3  NEUTROABS 4.6  --   --   --   --   --   HGB 6.0* 11.6* 10.2* 10.0* 9.9* 10.0*  HCT 17.8* 34.0* 30.9* 30.4* 29.8* 30.2*  MCV 89.9 89.0 91.4 92.1 89.5 89.6  PLT 91* 63* 58* 58* 54* 71*   Cardiac Enzymes:  Recent Labs Lab 10/15/13 1318 10/16/13 0330  TROPONINI <0.30 <0.30   BNP (last 3 results)  Recent Labs  10/15/13 1318  PROBNP 24313.0*   CBG:  Recent Labs Lab 10/19/13 1106 10/19/13 1657 10/19/13 2158 10/20/13 0621 10/20/13 1127  GLUCAP 277* 226* 263* 162* 223*    Recent Results (from the past 240 hour(s))  CULTURE, BLOOD (ROUTINE X 2)     Status: None   Collection Time    10/15/13  2:15 PM      Result Value Ref Range Status   Specimen Description BLOOD LEFT ANTECUBITAL   Final   Special Requests BOTTLES DRAWN AEROBIC AND ANAEROBIC 5CC   Final   Culture  Setup Time     Final   Value: 10/15/2013 18:51     Performed at [facility]   Culture     Final   Value:        BLOOD CULTURE RECEIVED NO GROWTH TO DATE CULTURE WILL BE HELD FOR 5 DAYS BEFORE ISSUING A FINAL NEGATIVE REPORT     Performed at [facility]   Report Status PENDING   Incomplete  CULTURE, BLOOD (ROUTINE X 2)     Status: None   Collection Time    10/15/13  2:30 PM      Result Value Ref Range Status   Specimen Description BLOOD HAND RIGHT   Final   Special Requests BOTTLES DRAWN AEROBIC AND ANAEROBIC 5CC   Final   Culture  Setup Time     Final   Value: 10/15/2013 18:51     Performed at [facility]   Culture     Final   Value:        BLOOD CULTURE RECEIVED NO GROWTH TO DATE CULTURE WILL BE HELD FOR 5 DAYS BEFORE ISSUING A FINAL NEGATIVE REPORT     Performed at [facility]   Report Status PENDING   Incomplete  MRSA PCR SCREENING     Status: None    Collection Time    10/15/13  4:43 PM      Result Value Ref Range Status   MRSA by PCR NEGATIVE  NEGATIVE Final   Comment:            The GeneXpert MRSA Assay (FDA     approved for NASAL specimens     only), is one component of a  comprehensive MRSA colonization     surveillance program. It is not     intended to diagnose MRSA     infection nor to guide or     monitor treatment for     MRSA infections.  CULTURE, RESPIRATORY (NON-EXPECTORATED)     Status: None   Collection Time    10/15/13  8:05 PM      Result Value Ref Range Status   Specimen Description TRACHEAL ASPIRATE   Final   Special Requests NONE   Final   Gram Stain     Final   Value: FEW WBC PRESENT,BOTH PMN AND MONONUCLEAR     RARE SQUAMOUS EPITHELIAL CELLS PRESENT     RARE GRAM POSITIVE COCCI     IN PAIRS IN CLUSTERS     Performed at [facility]   Culture     Final   Value: Non-Pathogenic Oropharyngeal-type Flora Isolated.     Performed at [facility]   Report Status 10/18/2013 FINAL   Final     Studies: No results found.  Scheduled Meds: . atorvastatin  10 mg Oral q1800  . carvedilol  6.25 mg Oral BID WC  . colchicine  0.3 mg Oral Daily  . docusate sodium  100 mg Oral BID  . glipiZIDE  2.5 mg Oral Q breakfast  . insulin aspart  0-5 Units Subcutaneous QHS  . insulin aspart  0-9 Units Subcutaneous TID WC  . isosorbide-hydrALAZINE  2 tablet Oral TID  . spironolactone  12.5 mg Oral Daily  . torsemide  40 mg Oral BID   Continuous Infusions:   Active Problems:   Acute respiratory failure   Chronic systolic heart failure   Pulmonary hypertension   Pulmonary edema   Acute gout    Time spent: 25 min    Lacy Duverney [facility]  [facility] Pager [phone]. If 7PM-7AM, please contact night-coverage at [email or web address], password [facility] 10/20/2013, 4:05 PM  LOS: 5 days

## 2013-10-20 NOTE — Progress Notes (Signed)
   Primary Cardiologist: Bensimhon   Subjective:  No significant shortness of breath. Walking the hallway with Dr. Coralyn Pear Hand pain has improved Abdomen is improved  Objective:  Vital Signs in the last 24 hours: Temp:  [97.4 F (36.3 C)-98.2 F (36.8 C)] 97.6 F (36.4 C) (09/13 0953) Pulse Rate:  [67-70] 67 (09/13 0953) Resp:  [18-20] 20 (09/13 0953) BP: (152-174)/(66-81) 168/69 mmHg (09/13 0953) SpO2:  [97 %-100 %] 97 % (09/13 0953) Weight:  [115 lb 15.4 oz (52.6 kg)] 115 lb 15.4 oz (52.6 kg) (09/13 0629)  Intake/Output from previous day: 09/12 0701 - 09/13 0700 In: 62 [P.O.:460] Out: 651 [Urine:650; Stool:1]   Physical Exam: General: Well developed, well nourished, in no acute distress. Head:  Normocephalic and atraumatic. Moderate JVP. Left neck dressing in place. Lungs: Clear to auscultation and percussion. No crackles today Heart: Normal S1 and S2.  No murmur, rubs or gallops.  Abdomen: soft, non-tender, positive bowel sounds. Probuerant, improved.  Extremities: No clubbing or cyanosis. No edema. Neurologic: Alert and oriented x 3.    Lab Results:  Recent Labs  10/19/13 0555 10/20/13 0755  WBC 4.5 4.3  HGB 9.9* 10.0*  PLT 54* 71*    Recent Labs  10/18/13 0500 10/19/13 0555  NA 140 137  K 3.7 3.9  CL 101 101  CO2 22 22  GLUCOSE 137* 196*  BUN 73* 73*  CREATININE 1.88* 1.76*  Personally viewed.   Telemetry:  Personally viewed.   EKG:  Normal sinus rhythm, 62, first degree AV block, nonspecific ST-T wave changes  Cardiac Studies:  ECHO EF 30-35% Akinesis of the mid-apicalinferolateral and inferior myocardium. Akinesis of the mid-apicalanterior, anterolateral, and apical myocardium.   Scheduled Meds: . atorvastatin  10 mg Oral q1800  . carvedilol  3.125 mg Oral BID WC  . colchicine  0.3 mg Oral Daily  . furosemide  40 mg Intravenous Q8H  . glipiZIDE  2.5 mg Oral Q breakfast  . insulin aspart  0-5 Units Subcutaneous QHS  . insulin aspart  0-9  Units Subcutaneous TID WC  . isosorbide-hydrALAZINE  2 tablet Oral TID  . spironolactone  12.5 mg Oral Daily   Continuous Infusions:  PRN Meds:.acetaminophen  Assessment/Plan:   63 year old with coronary artery disease history of ischemic cardiomyopathy, with acute on chronic systolic heart failure, pulmonary venous hypertension, COPD stage IV originally was found down at home and intubated. Probable lower GI bleeding with hemoglobin at 6 admission and pulmonary edema on chest x-ray.  1. Acute on chronic systolic heart failure-ischemic cardiomyopathy ejection fraction 30-35%.  - Change Lasix to Demadex (home dose 40 twice a day). I have ordered.  - Bidil has been increased to 75/40 TID (has Medicaid)  - Spironolactone 12.5 mg has been added. Potassium stable.  - Increasing Carvedilol to 6.25 mg twice a day.   2. Chronic kidney disease stage IV-creatinine stable 1.7. Her previous baseline is 1.8-2.  3. GI bleed-hematochezia has resolved. Hemoglobin 11.6 after infusion after starting at 6. Endoscopic evaluations on hold. Proton pump inhibitor. Currently hemoglobin 10.  4. Left hand pain-x-ray okay. Gout. Colchicine 0.3 daily. Finished up prednisone. 40 mg.  Comfortable with discharge. I sent a message to have her appointment set up in 7 days in heart failure clinic.  SKAINS, [place] 10/20/2013, 10:06 AM

## 2013-10-21 DIAGNOSIS — K922 Gastrointestinal hemorrhage, unspecified: Secondary | ICD-10-CM | POA: Diagnosis present

## 2013-10-21 DIAGNOSIS — E119 Type 2 diabetes mellitus without complications: Secondary | ICD-10-CM | POA: Diagnosis present

## 2013-10-21 DIAGNOSIS — D649 Anemia, unspecified: Secondary | ICD-10-CM | POA: Diagnosis present

## 2013-10-21 LAB — BASIC METABOLIC PANEL
Anion gap: 14 (ref 5–15)
BUN: 77 mg/dL — AB (ref 6–23)
CO2: 23 mEq/L (ref 19–32)
CREATININE: 1.63 mg/dL — AB (ref 0.50–1.10)
Calcium: 8.4 mg/dL (ref 8.4–10.5)
Chloride: 100 mEq/L (ref 96–112)
GFR calc Af Amer: 38 mL/min — ABNORMAL LOW (ref >90)
GFR, EST NON AFRICAN AMERICAN: 33 mL/min — AB (ref >90)
GLUCOSE: 193 mg/dL — AB (ref 70–99)
Potassium: 3.5 mEq/L — ABNORMAL LOW (ref 3.7–5.3)
Sodium: 137 mEq/L (ref 137–147)

## 2013-10-21 LAB — CULTURE, BLOOD (ROUTINE X 2)
Culture: NO GROWTH
Culture: NO GROWTH

## 2013-10-21 LAB — GLUCOSE, CAPILLARY
GLUCOSE-CAPILLARY: 179 mg/dL — AB (ref 70–99)
Glucose-Capillary: 151 mg/dL — ABNORMAL HIGH (ref 70–99)

## 2013-10-21 MED ORDER — SPIRONOLACTONE 25 MG PO TABS: 25.0000 mg | ORAL_TABLET | Freq: Every day | ORAL | Status: DC

## 2013-10-21 MED ORDER — COLCHICINE 0.6 MG PO TABS: 0.3000 mg | ORAL_TABLET | Freq: Every day | ORAL | Status: DC

## 2013-10-21 MED ORDER — ALLOPURINOL 100 MG PO TABS: 100.0000 mg | ORAL_TABLET | Freq: Every day | ORAL | Status: DC

## 2013-10-21 MED ORDER — ISOSORB DINITRATE-HYDRALAZINE 20-37.5 MG PO TABS: 2.0000 | ORAL_TABLET | Freq: Three times a day (TID) | ORAL | Status: DC

## 2013-10-21 MED ORDER — FLEET ENEMA 7-19 GM/118ML RE ENEM
1.0000 | ENEMA | Freq: Once | RECTAL | Status: DC
  Filled 2013-10-21: qty 1

## 2013-10-21 MED ORDER — TORSEMIDE 20 MG PO TABS: 40.0000 mg | ORAL_TABLET | Freq: Two times a day (BID) | ORAL | Status: DC

## 2013-10-21 MED ORDER — CARVEDILOL 6.25 MG PO TABS: 6.2500 mg | ORAL_TABLET | Freq: Two times a day (BID) | ORAL | Status: DC

## 2013-10-21 MED ORDER — ATORVASTATIN CALCIUM 10 MG PO TABS: 10.0000 mg | ORAL_TABLET | Freq: Every day | ORAL | Status: DC

## 2013-10-21 MED ORDER — SPIRONOLACTONE 25 MG PO TABS
25.0000 mg | ORAL_TABLET | Freq: Every day | ORAL | Status: DC
Start: 2013-10-21 — End: 2013-10-21
  Administered 2013-10-21: 25 mg via ORAL
  Filled 2013-10-21: qty 1

## 2013-10-21 NOTE — Progress Notes (Signed)
CARDIAC REHAB PHASE I   PRE:  Rate/Rhythm: 68 SR    BP: sitting 154/59    SaO2: 98 RA  MODE:  Ambulation: 380 ft   POST:  Rate/Rhythm: 64 SR    BP: sitting 170/61     SaO2: 96 RA  Tolerated well. Sts she has been having diarrhea today. Attempted discussing low sodium, daily wts, calling HF clinic with pt. Some understanding noted. RN to discuss ed with son when he comes at 2. Pt seems to understand low sodium diet. Sts her son gives her her pills. [id number]   Josephina Shih Frazer CES, ACSM 10/21/2013 12:05 PM

## 2013-10-21 NOTE — Discharge Summary (Signed)
Physician Discharge Summary  [name] [name] PXT:[id number] DOB: Jun 26, 1950 DOA: 10/15/2013  PCP: No primary provider on file.  Admit date: 10/15/2013 Discharge date: 10/21/2013  Time spent: 40 minutes  Recommendations for Outpatient Follow-up:  1. Heart failure clinic- for CHF follow up and resumption of ASA 2. Follow up Hgb for assessment of anemia, BMET for assessment of creatinine, Uric Acid levels for managmement of gout, Hgb A1c for diabetes management 3. Discharge home  Discharge Diagnoses:  Active Problems:   Acute respiratory failure   Chronic systolic heart failure   Pulmonary hypertension   Pulmonary edema   Acute gout   Type II or unspecified type diabetes mellitus without mention of complication, not stated as uncontrolled   GI bleed   Anemia   Discharge Condition: stable  Diet recommendation: Heart health / carb modified diet  Filed Weights   10/19/13 0642 10/20/13 0629 10/21/13 0552  Weight: 52.7 kg (116 lb 2.9 oz) 52.6 kg (115 lb 15.4 oz) 52.1 kg (114 lb 13.8 oz)    History of present illness:  [name], DM presented to ED via ambulance with son who aids with history. Son states patient with increasing edema recently, and was found unresponsive. EMS noted several episodes of apnea and used BVM en route. In ED, patient was intubated and pink frothy secretions were noted. EKG showed a sinus rhythm, BNP was found to be 23k, CXR was non-diagnostic, was hypoglycemic, and hemoglobin of 6. Given D50, 2L of PRBC and admitted for further management.   Hospital Course:   Acute respiratory failure  -Like secondary to acute CHF exacerbation.  Has resolved on discharge, as evident by no requirement of supplemental O2 and clear lungs bilaterally. -Initially was found with episodes of apnea at home requiring intubation and was extubated 9/9.  She was placed on supplemental O2 and was diuresed with IV lasix, and upon improvement, was transitioned to oral  Lasix. -Discharged on oral torsemide 40 mg BID.  Acute on Chronic Heart Failure  -Stable on discharge-lungs clear bilaterally and has well tolerated ambulation of up to 200 ft without dyspnea and with good saturations on RA. On discharge, transitioned to Demadex 40 mg PO BID today; Bidil increased to 2 tabs 3 times daily; Coreg increased to 6.25 BID; with addition of spironloactone 25 mg daily. -2D echo 9/15 with EF of 30-35%   CAD  - stable on discharge.  Appreciate cardiology recommendation- due to GIB bleed during admission, Aspirin is stopped on discharge. Might resume in one week at heart failure clinic follow up.  -Follow up with heart failure clinic at scheduled appt on 9/21  GI bleed  - stable on discharge hgb 10 and no overt bleeding.  Suspected lower GIB.  Presented with hematochezia, +guiac, hgb of 6, and was thus given 2 units or PRBC in ED. Per Gastroenterology recommendations, there was no significant GIB, and thus no plans for endoscopic evaluations,  patient is to continue PPI.    Anemia  -stable with Hgb of 10; improvement in Hgb after transfusion of 2 units of blood.  -follow up  CBC to monitor hgb  CKD, stage IV  -creatinine at 1.75 on discharge -follow up BMET to monitor kidney function  Acute Gout -Stable on discharge- mild left hand pain and erythema on palmar surface, Significant decrease in swelling.  CXR- unremarkable. Uric acid elevated at 15. Treated with colchicine 0.3 mg.  -On discharge, continue colchicine, with addition of allopurinol due to elevated uric  acid levels -Follow up Uric Acid levels for management of gout  DM II -CBGs stable on discharge. Treated inpatient with SSI.  -On discharge, resume home regimen of glucotrol -Follow up with PCP for hgb A1c and diabetes management  Aspiration pneumonitis/PNA  -Patient intubated due to acute respiratory failure, Extubate 9/9. was treated empirically with IV rocephin for 3 days. Blood and sputum cultures  negative.   Procedures:  2D echo  Consultations:  Cardiology  GI   Discharge Exam: Filed Vitals:   10/21/13 0552  BP: 162/78  Pulse: 66  Temp: 97.7 F (36.5 C)  Resp: 18     Exam General: Alert and oriented pleasant female, in NAD Eyes: Anicteric account.  Cardiovascular: Regular rate and rhythm.  No murmurs, rubs, or gallops. Respiratory: Clear to auscultate bilaterally.  No rhonchi or crepitations. Abdomen: Soft nontender bowel sounds present. No guarding or rigidity.  Musculoskeletal: No edema. Left hand- mild pain and erythema of palmar surface, limited mobility of the .4/5 strength  Psychiatric: Appears normal.  Neurologic: Alert awake oriented to time place and person.    Discharge Instructions     Medication List    STOP taking these medications       aspirin EC 81 MG tablet     hydrALAZINE 100 MG tablet  Commonly known as:  APRESOLINE     isosorbide mononitrate 60 MG 24 hr tablet  Commonly known as:  IMDUR     loperamide 2 MG tablet  Commonly known as:  IMODIUM A-D      TAKE these medications       allopurinol 100 MG tablet  Commonly known as:  ZYLOPRIM  Take 1 tablet (100 mg total) by mouth daily.     atorvastatin 10 MG tablet  Commonly known as:  LIPITOR  Take 1 tablet (10 mg total) by mouth daily at 6 PM.     carvedilol 6.25 MG tablet  Commonly known as:  COREG  Take 1 tablet (6.25 mg total) by mouth 2 (two) times daily.     colchicine 0.6 MG tablet  Take 0.5 tablets (0.3 mg total) by mouth daily.     glipiZIDE 2.5 MG 24 hr tablet  Commonly known as:  GLUCOTROL XL  Take 2.5 mg by mouth daily with breakfast.     isosorbide-hydrALAZINE 20-37.5 MG per tablet  Commonly known as:  BIDIL  Take 2 tablets by mouth 3 (three) times daily.     potassium chloride SA 20 MEQ tablet  Commonly known as:  K-DUR,KLOR-CON  Take 20 mEq by mouth daily.     spironolactone 25 MG tablet  Commonly known as:  ALDACTONE  Take 1 tablet (25 mg  total) by mouth daily.     torsemide 20 MG tablet  Commonly known as:  DEMADEX  Take 2 tablets (40 mg total) by mouth 2 (two) times daily.       No Known Allergies Follow-up Information   Follow up with [facility] On 10/28/2013. (@ 1:45 pm; Heart Failure Clinic located in the [place]. BRING ALL YOUR MEDICATIONS TO YOUR VISIT)    Specialty:  Cardiology   Contact information:   [address] [id number] Danice Goltz [place] [address] [phone]       The results of significant diagnostics from this hospitalization (including imaging, microbiology, ancillary and laboratory) are listed below for reference.    Significant Diagnostic Studies: Dg Wrist Complete Left  10/19/13   CLINICAL  DATA:  Pain and swelling.  Possible injury  EXAM: LEFT WRIST - COMPLETE 3+ VIEW  COMPARISON:  None.  FINDINGS: There is no evidence of fracture or dislocation. There is no evidence of arthropathy or other focal bone abnormality. Soft tissues are unremarkable.  IMPRESSION: Negative.   Electronically Signed   By: Franchot Gallo M.D.   On: 10/17/2013 08:05   Dg Abd 1 View  10/21/2013   CLINICAL DATA:  Abdominal distention.  EXAM: ABDOMEN - 1 VIEW  COMPARISON:  Abdominal radiograph performed 10/15/2013  FINDINGS: The visualized bowel gas pattern is unremarkable. Scattered air and stool filled loops of colon are seen; no abnormal dilatation of small bowel loops is seen to suggest small bowel obstruction. No free intra-abdominal air is identified, though evaluation for free air is limited on a single supine view. A small amount of air is noted within the stomach.  The visualized osseous structures are within normal limits; the sacroiliac joints are unremarkable in appearance.  IMPRESSION: Unremarkable bowel gas pattern; no free intra-abdominal air seen. Moderate amount of stool noted in the colon.   Electronically Signed   By: Garald Balding M.D.   On:  10/21/2013 01:46   Ct Head Wo Contrast  10/15/2013   CLINICAL DATA:  Respiratory arrest, altered mental status  EXAM: CT HEAD WITHOUT CONTRAST  TECHNIQUE: Contiguous axial images were obtained from the base of the skull through the vertex without intravenous contrast.  COMPARISON:  None.  FINDINGS: There is no evidence of mass effect, midline shift, or extra-axial fluid collections. There is no evidence of a space-occupying lesion or intracranial hemorrhage. There is no evidence of a cortical-based area of acute infarction. There is generalized cerebral atrophy. There is periventricular white matter low attenuation likely secondary to microangiopathy.  The ventricles and sulci are appropriate for the patient's age. The basal cisterns are patent.  Visualized portions of the orbits are unremarkable. There is a small amount of fluid in the ethmoid sinuses posteriorly. There is a small amount of fluid in the sphenoid sinus. There is high-density fluid in the posterior nasal passage concerning for blood products. Cerebrovascular atherosclerotic calcifications are noted.  The osseous structures are unremarkable.  IMPRESSION: 1. No acute intracranial pathology. 2. There is high-density fluid in the posterior nasal passage concerning for blood products.   Electronically Signed   By: Kathreen Devoid   On: 10/15/2013 15:02   Dg Chest Port 1 View  10/17/2013   CLINICAL DATA:  Followup respiratory failure  EXAM: PORTABLE CHEST - 1 VIEW  COMPARISON:  Prior chest x-ray 10/16/2013  FINDINGS: Extensive metallic artifact projects over the chest in the formal of the numerous coiled cardiac leads. Stable enlargement of the cardiopericardial silhouette. Atherosclerotic calcifications noted in the transverse aorta. The patient has been extubated and both the left IJ central venous catheter and nasogastric tube have been removed. Inspiratory volumes are slightly lower and there is slightly increased left basilar atelectasis. Pulmonary  vascular congestion has improved. No pneumothorax. Probable small bilateral layering pleural effusions. No acute osseous abnormality.  IMPRESSION: 1. Interval extubation, removal of nasogastric tube and removal of left IJ central venous catheter. 2. Interval resolution of pulmonary vascular congestion. 3. Slightly increased left basilar atelectasis. 4. Trace bilateral pleural effusions.   Electronically Signed   By: Jacqulynn Cadet M.D.   On: 10/17/2013 07:45   Dg Chest Port 1 View  10/16/2013   CLINICAL DATA:  Airspace disease.  EXAM: PORTABLE CHEST - 1 VIEW  COMPARISON:  10/15/2013.  FINDINGS: Endotracheal tube 1.2 cm above the carina. Proximal repositioning should be considered. Left IJ line in good anatomic position. NG tube in good anatomic position. Stable cardiomegaly with normal pulmonary vascularity. Bilateral airspace disease again noted. Small pleural effusions noted. Airspace disease could be secondary to pulmonary edema, ARDS, and/or pneumonia. No acute bony abnormality.  IMPRESSION: 1. Endotracheal tube tip 1.2 cm above the carina. 2. Left IJ line and NG tube in stable position. 3. Persistent bilateral airspace disease. This could be secondary to pulmonary edema, ARDS, and/or bilateral pneumonia. Associated small bilateral pleural effusions noted. 4. Cardiomegaly.   Electronically Signed   By: [place]   On: 10/16/2013 07:49   Dg Chest Port 1 View  10/15/2013   CLINICAL DATA:  Post central line placement. Evaluate for pneumothorax.  EXAM: PORTABLE CHEST - 1 VIEW  COMPARISON:  Earlier same day  FINDINGS: Grossly unchanged enlarged cardiac silhouette and mediastinal contours with atherosclerotic plaque within the thoracic aorta. Interval placement of a left jugular approach intravenous catheter with tip projected over the superior cavoatrial junction. Otherwise, stable position of remaining support apparatus. No pneumothorax. The pulmonary vasculature remains indistinct with cephalization  of flow. Interval increase in size of small bilateral effusions with associated worsening bibasilar opacities, left greater than right. Unchanged bones.  IMPRESSION: 1. Interval placement of left jugular approach intravenous catheter with tip projected of the superior cavoatrial junction. Otherwise, stable position of remaining support apparatus. No pneumothorax. 2. Grossly unchanged findings of pulmonary edema with minimal increase in size of bilateral effusions and associated worsening bibasilar opacities, left greater than right. 3. Cardiomegaly and atherosclerosis.   Electronically Signed   By: Sandi Mariscal M.D.   On: 10/15/2013 18:59   Dg Chest Portable 1 View  10/15/2013   CLINICAL DATA:  Vaginal bleeding.  Unresponsive.  EXAM: PORTABLE CHEST - 1 VIEW  COMPARISON:  None.  FINDINGS: Endotracheal tube tip lies 2.4 cm above the chronic. Nasogastric tube passes below the diaphragm well into the stomach.  Cardiac silhouette is mildly enlarged. No mediastinal or hilar masses. There are prominent bronchovascular markings. No lung consolidation or edema. No pleural effusion or gross pneumothorax on this supine study.  Bony thorax is demineralized but intact.  IMPRESSION: 1. Endotracheal tube and nasogastric tube are well positioned. 2. No acute cardiopulmonary disease.   Electronically Signed   By: Lajean Manes M.D.   On: 10/15/2013 13:47   Dg Abd Portable 1v  10/15/2013   CLINICAL DATA:  NG tube placement  EXAM: PORTABLE ABDOMEN - 1 VIEW  COMPARISON:  None.  FINDINGS: Enteric tube tip and side port projects over the expected location of the gastric antrum.  There is a paucity of bowel gas without definite evidence of obstruction.  Nondiagnostic evaluation for pneumoperitoneum secondary supine positioning exclusion the lower thorax. No definite pneumatosis or portal venous gas.  Limited visualization of lower thorax suggests small left-sided effusion with associated left basilar opacities. There is exclusion of  the caudal aspect of the right hemi thorax.  No acute osseus abnormalities. A catheter overlies the urinary bladder.  IMPRESSION: Enteric tube tip and side port projects over the gastric fundus.   Electronically Signed   By: Sandi Mariscal M.D.   On: 10/15/2013 19:06   Dg Hand Complete Left  10/17/2013   CLINICAL DATA:  Pain and swelling.  Question injury  EXAM: LEFT HAND - COMPLETE 3+ VIEW  COMPARISON:  None.  FINDINGS: There is no evidence of fracture or  dislocation. There is no evidence of arthropathy or other focal bone abnormality. Soft tissues are unremarkable.  IMPRESSION: Negative.   Electronically Signed   By: Franchot Gallo M.D.   On: 10/17/2013 08:04    Microbiology: Recent Results (from the past 240 hour(s))  CULTURE, BLOOD (ROUTINE X 2)     Status: None   Collection Time    10/15/13  2:15 PM      Result Value Ref Range Status   Specimen Description BLOOD LEFT ANTECUBITAL   Final   Special Requests BOTTLES DRAWN AEROBIC AND ANAEROBIC 5CC   Final   Culture  Setup Time     Final   Value: 10/15/2013 18:51     Performed at [facility]   Culture     Final   Value: NO GROWTH 5 DAYS     Performed at [facility]   Report Status 10/21/2013 FINAL   Final  CULTURE, BLOOD (ROUTINE X 2)     Status: None   Collection Time    10/15/13  2:30 PM      Result Value Ref Range Status   Specimen Description BLOOD HAND RIGHT   Final   Special Requests BOTTLES DRAWN AEROBIC AND ANAEROBIC 5CC   Final   Culture  Setup Time     Final   Value: 10/15/2013 18:51     Performed at [facility]   Culture     Final   Value: NO GROWTH 5 DAYS     Performed at [facility]   Report Status 10/21/2013 FINAL   Final  MRSA PCR SCREENING     Status: None   Collection Time    10/15/13  4:43 PM      Result Value Ref Range Status   MRSA by PCR NEGATIVE  NEGATIVE Final   Comment:            The GeneXpert MRSA Assay (FDA     approved for NASAL specimens     only), is one  component of a     comprehensive MRSA colonization     surveillance program. It is not     intended to diagnose MRSA     infection nor to guide or     monitor treatment for     MRSA infections.  CULTURE, RESPIRATORY (NON-EXPECTORATED)     Status: None   Collection Time    10/15/13  8:05 PM      Result Value Ref Range Status   Specimen Description TRACHEAL ASPIRATE   Final   Special Requests NONE   Final   Gram Stain     Final   Value: FEW WBC PRESENT,BOTH PMN AND MONONUCLEAR     RARE SQUAMOUS EPITHELIAL CELLS PRESENT     RARE GRAM POSITIVE COCCI     IN PAIRS IN CLUSTERS     Performed at [facility]   Culture     Final   Value: Non-Pathogenic Oropharyngeal-type Flora Isolated.     Performed at [facility]   Report Status 10/18/2013 FINAL   Final     Labs: Basic Metabolic Panel:  Recent Labs Lab 10/17/13 0325 10/18/13 0500 10/19/13 0555 10/20/13 0755 10/21/13 0403  NA 140 140 137 135* 137  K 3.5* 3.7 3.9 3.7 3.5*  CL 103 101 101 98 100  CO2 22 22 22 22 23   GLUCOSE 112* 137* 196* 149* 193*  BUN 74* 73* 73* 75* 77*  CREATININE 1.71* 1.88* 1.76*  1.75* 1.63*  CALCIUM 8.1* 8.4 8.6 8.7 8.4   Liver Function Tests: No results found for this basename: AST, ALT, ALKPHOS, BILITOT, PROT, ALBUMIN,  in the last 168 hours No results found for this basename: LIPASE, AMYLASE,  in the last 168 hours No results found for this basename: AMMONIA,  in the last 168 hours CBC:  Recent Labs Lab 10/15/13 1318 10/16/13 0335 10/17/13 0325 10/18/13 0500 10/19/13 0555 10/20/13 0755  WBC 5.5 7.1 5.3 6.3 4.5 4.3  NEUTROABS 4.6  --   --   --   --   --   HGB 6.0* 11.6* 10.2* 10.0* 9.9* 10.0*  HCT 17.8* 34.0* 30.9* 30.4* 29.8* 30.2*  MCV 89.9 89.0 91.4 92.1 89.5 89.6  PLT 91* 63* 58* 58* 54* 71*   Cardiac Enzymes:  Recent Labs Lab 10/15/13 1318 10/16/13 0330  TROPONINI <0.30 <0.30   BNP: BNP (last 3 results)  Recent Labs  10/15/13 1318  PROBNP 24313.0*    CBG:  Recent Labs Lab 10/20/13 0621 10/20/13 1127 10/20/13 1647 10/20/13 2119 10/21/13 0645  GLUCAP 162* 223* 222* 294* 151*       Signed:  Lacy Duverney PA-C  [facility] 10/21/2013, 11:04 AM  Addendum I personally saw and evaluated patient, agree with the above findings. I think she is doing better today, ambulating without any issues, tolerating PO intake, had a coupe bowel movements in the last 24 hours. Discussed with Cardiology, they think she's stable for discharge.

## 2013-10-21 NOTE — Progress Notes (Signed)
Patient ID: [name] [name], female   DOB: 04-10-1950, 63 y.o.   MRN: [id number]   SUBJECTIVE:  No complaints this morning.  Hand pain improved.  Denies dyspnea walking around her room.  ECHO EF 30-35% Akinesis of the mid-apicalinferolateral and inferior myocardium. Akinesis of the mid-apicalanterior, anterolateral, and apical myocardium.   Uric Acid 15 Hgb 11.6>10.2>10 Creatinine 1.7>1.8>1.63  Scheduled Meds: . atorvastatin  10 mg Oral q1800  . carvedilol  6.25 mg Oral BID WC  . colchicine  0.3 mg Oral Daily  . docusate sodium  100 mg Oral BID  . glipiZIDE  2.5 mg Oral Q breakfast  . insulin aspart  0-5 Units Subcutaneous QHS  . insulin aspart  0-9 Units Subcutaneous TID WC  . isosorbide-hydrALAZINE  2 tablet Oral TID  . spironolactone  25 mg Oral Daily  . torsemide  40 mg Oral BID   Continuous Infusions:   PRN Meds:.    Filed Vitals:   10/20/13 1300 10/20/13 1558 10/20/13 2123 10/21/13 0552  BP: 163/76 155/88 140/70 162/78  Pulse: 68 67 68 66  Temp: 97.5 F (36.4 C)  97.3 F (36.3 C) 97.7 F (36.5 C)  TempSrc: Oral  Oral Oral  Resp: 20  20 18   Height:      Weight:    114 lb 13.8 oz (52.1 kg)  SpO2: 98%  99% 99%    Intake/Output Summary (Last 24 hours) at 10/21/13 0839 Last data filed at 10/21/13 0734  Gross per 24 hour  Intake    720 ml  Output   1600 ml  Net   -880 ml    LABS: Basic Metabolic Panel:  Recent Labs  10/20/13 0755 10/21/13 0403  NA 135* 137  K 3.7 3.5*  CL 98 100  CO2 22 23  GLUCOSE 149* 193*  BUN 75* 77*  CREATININE 1.75* 1.63*  CALCIUM 8.7 8.4   Liver Function Tests: No results found for this basename: AST, ALT, ALKPHOS, BILITOT, PROT, ALBUMIN,  in the last 72 hours No results found for this basename: LIPASE, AMYLASE,  in the last 72 hours CBC:  Recent Labs  10/19/13 0555 10/20/13 0755  WBC 4.5 4.3  HGB 9.9* 10.0*  HCT 29.8* 30.2*  MCV 89.5 89.6  PLT 54* 71*   Cardiac Enzymes: No results found for this basename: CKTOTAL,  CKMB, CKMBINDEX, TROPONINI,  in the last 72 hours BNP: No components found with this basename: POCBNP,  D-Dimer: No results found for this basename: DDIMER,  in the last 72 hours Hemoglobin A1C: No results found for this basename: HGBA1C,  in the last 72 hours Fasting Lipid Panel: No results found for this basename: CHOL, HDL, LDLCALC, TRIG, CHOLHDL, LDLDIRECT,  in the last 72 hours Thyroid Function Tests: No results found for this basename: TSH, T4TOTAL, FREET3, T3FREE, THYROIDAB,  in the last 72 hours Anemia Panel: No results found for this basename: VITAMINB12, FOLATE, FERRITIN, TIBC, IRON, RETICCTPCT,  in the last 72 hours  PHYSICAL EXAM General: No respiratory distress. Lying in bed.   Neck: JVP 8 cm.  Lungs: Slight crackles at bases.  CV: Nondisplaced PMI.  Heart regular S1/S2, no S3/S4, no murmur.  No peripheral edema.   Abdomen: Soft, nontender, no hepatosplenomegaly, no distention.  Neurologic: Alert, follows commands Psych: Normal affect. Extremities: No clubbing or cyanosis.    TELEMETRY: Reviewed telemetry pt in NSR  ASSESSMENT AND PLAN: 63 yo with history of CAD and ischemic cardiomyopathy, chronic CHF with predominantly pulmonary venous hypertension, and CKD  stage IV was found down at home and intubated.  She was noted to have probable lower GI bleeding with hemoglobin 6 at admission and pulmonary edema on CXR.   1. Pulmonary: Extubated. Passed swallow evaluation. Suspect pulmonary edema + possible aspiration pneumonitis/PNA.  She is now off antibiotics. 2. Neuro: Alert and oriented.  3. Acute on chronic systolic CHF: Ischemic cardiomyopathy, EF 30-35%.  Volume status is improved, she is back on po torsemide. - Continue current Bidil and Coreg.  - BP still high, and K is low normal.  Will increase spironolactone to 25 mg daily. - Holding off on ACEI with CKD.  4. CAD: Troponin negative, doubt ACS.  She is on home statin.  Given low platelets and GI bleeding this  admission (now resolved), would stay off aspirin another week.  Can restart at followup in CHF clinic in 1 week if hemoglobin stable. 5. GI bleeding: Hematochezia, possible lower GI bleeding.  Overt bleeding has resolved. Hemoglobin up to 11.6 after transfusion.  Started with hemoglobin 6.  Endoscopic evaluations on hold.  On PPI.  Holding ASA another week as above. 6. CKD: Stage IV.  Creatinine remains stable at 1.6 (Baseline (1.8-2 prior).   7. L hand pain: Improved. 2 view XRay ok. Suspect gout.  Uric Acid 15. Would continue colchicine 0.3 daily at home. 8. Disposition: Think she can go home today.  Needs followup CHF clinic in 1 week.  Restart ASA 81 in 1 week.  Meds: colchicine 0.3 daily (would consider allopurinol initiation given very elevated uric acid), torsemide 40 mg bid, spironolactone 25 mg daily, Bidil 2 tabs tid, Coreg 6.25 mg bid, atorvastatin 10 daily, diabetes meds per [facility].   Loralie Champagne 10/21/2013 8:39 AM

## 2013-10-21 NOTE — Progress Notes (Signed)
Patient is sleeping peacefully. Maintained on the monitor and is in first degree heart block. Will continue to monitor.  Esperanza Heir, RN

## 2013-10-21 NOTE — Progress Notes (Signed)
Inpatient Diabetes Program Recommendations  AACE/ADA: New Consensus Statement on Inpatient Glycemic Control (2013)  Target Ranges:  Prepandial:   less than 140 mg/dL      Peak postprandial:   less than 180 mg/dL (1-2 hours)      Critically ill patients:  140 - 180 mg/dL  Results for Golab, Danett (MRN [id number]) as of 10/21/2013 11:41  Ref. Range 10/20/2013 11:27 10/20/2013 16:47 10/20/2013 21:19 10/21/2013 06:45 10/21/2013 11:05  Glucose-Capillary Latest Range: 70-99 mg/dL 223 (H) 222 (H) 294 (H) 151 (H) 179 (H)   Inpatient Diabetes Program Recommendations Correction (SSI): consider increasing Novolog to moderate scale Thank you  Raoul Pitch BSN, RN,CDE Inpatient Diabetes Coordinator [phone] (team pager)

## 2013-10-21 NOTE — Progress Notes (Signed)
Pt's son given heart failure education and discharge instructions, pt's son verbalized understanding.  Pt DC home via wc.

## 2013-10-22 ENCOUNTER — Encounter (HOSPITAL_COMMUNITY): Payer: Self-pay | Admitting: *Deleted

## 2013-10-27 ENCOUNTER — Encounter (HOSPITAL_COMMUNITY): Payer: Self-pay | Admitting: Emergency Medicine

## 2013-10-27 ENCOUNTER — Emergency Department (HOSPITAL_COMMUNITY): Payer: Medicaid Other

## 2013-10-27 ENCOUNTER — Inpatient Hospital Stay (HOSPITAL_COMMUNITY)
Admission: EM | Admit: 2013-10-27 | Discharge: 2013-10-30 | DRG: 637 | Disposition: A | Discharge status: Home / Self Care | Payer: Medicaid Other | Attending: Internal Medicine | Admitting: Internal Medicine

## 2013-10-27 DIAGNOSIS — I130 Hypertensive heart and chronic kidney disease with heart failure and stage 1 through stage 4 chronic kidney disease, or unspecified chronic kidney disease: Secondary | ICD-10-CM | POA: Diagnosis present

## 2013-10-27 DIAGNOSIS — Z79899 Other long term (current) drug therapy: Secondary | ICD-10-CM

## 2013-10-27 DIAGNOSIS — N184 Chronic kidney disease, stage 4 (severe): Secondary | ICD-10-CM

## 2013-10-27 DIAGNOSIS — R0789 Other chest pain: Secondary | ICD-10-CM

## 2013-10-27 DIAGNOSIS — T68XXXA Hypothermia, initial encounter: Secondary | ICD-10-CM

## 2013-10-27 DIAGNOSIS — D61818 Other pancytopenia: Secondary | ICD-10-CM

## 2013-10-27 DIAGNOSIS — I2510 Atherosclerotic heart disease of native coronary artery without angina pectoris: Secondary | ICD-10-CM | POA: Diagnosis present

## 2013-10-27 DIAGNOSIS — E11649 Type 2 diabetes mellitus with hypoglycemia without coma: Secondary | ICD-10-CM

## 2013-10-27 DIAGNOSIS — Z7982 Long term (current) use of aspirin: Secondary | ICD-10-CM

## 2013-10-27 DIAGNOSIS — I5023 Acute on chronic systolic (congestive) heart failure: Secondary | ICD-10-CM

## 2013-10-27 DIAGNOSIS — R680 Hypothermia, not associated with low environmental temperature: Secondary | ICD-10-CM | POA: Diagnosis present

## 2013-10-27 DIAGNOSIS — E1169 Type 2 diabetes mellitus with other specified complication: Principal | ICD-10-CM | POA: Diagnosis present

## 2013-10-27 DIAGNOSIS — N189 Chronic kidney disease, unspecified: Secondary | ICD-10-CM

## 2013-10-27 DIAGNOSIS — I5022 Chronic systolic (congestive) heart failure: Secondary | ICD-10-CM

## 2013-10-27 DIAGNOSIS — E119 Type 2 diabetes mellitus without complications: Secondary | ICD-10-CM | POA: Diagnosis present

## 2013-10-27 DIAGNOSIS — E1149 Type 2 diabetes mellitus with other diabetic neurological complication: Secondary | ICD-10-CM | POA: Diagnosis present

## 2013-10-27 DIAGNOSIS — I2589 Other forms of chronic ischemic heart disease: Secondary | ICD-10-CM | POA: Diagnosis present

## 2013-10-27 DIAGNOSIS — G9340 Encephalopathy, unspecified: Secondary | ICD-10-CM | POA: Diagnosis present

## 2013-10-27 DIAGNOSIS — I2789 Other specified pulmonary heart diseases: Secondary | ICD-10-CM | POA: Diagnosis present

## 2013-10-27 DIAGNOSIS — I509 Heart failure, unspecified: Secondary | ICD-10-CM | POA: Diagnosis present

## 2013-10-27 DIAGNOSIS — E162 Hypoglycemia, unspecified: Secondary | ICD-10-CM

## 2013-10-27 DIAGNOSIS — E1122 Type 2 diabetes mellitus with diabetic chronic kidney disease: Secondary | ICD-10-CM

## 2013-10-27 DIAGNOSIS — E1142 Type 2 diabetes mellitus with diabetic polyneuropathy: Secondary | ICD-10-CM | POA: Diagnosis present

## 2013-10-27 LAB — CBC WITH DIFFERENTIAL/PLATELET
Basophils Absolute: 0 10*3/uL (ref 0.0–0.1)
Basophils Relative: 0 % (ref 0–1)
EOS PCT: 4 % (ref 0–5)
Eosinophils Absolute: 0.1 10*3/uL (ref 0.0–0.7)
HEMATOCRIT: 31.4 % — AB (ref 36.0–46.0)
Hemoglobin: 10.3 g/dL — ABNORMAL LOW (ref 12.0–15.0)
LYMPHS ABS: 0.4 10*3/uL — AB (ref 0.7–4.0)
Lymphocytes Relative: 13 % (ref 12–46)
MCH: 30.2 pg (ref 26.0–34.0)
MCHC: 32.8 g/dL (ref 30.0–36.0)
MCV: 92.1 fL (ref 78.0–100.0)
MONO ABS: 0.3 10*3/uL (ref 0.1–1.0)
Monocytes Relative: 11 % (ref 3–12)
NEUTROS ABS: 2 10*3/uL (ref 1.7–7.7)
Neutrophils Relative %: 72 % (ref 43–77)
PLATELETS: 72 10*3/uL — AB (ref 150–400)
RBC: 3.41 MIL/uL — AB (ref 3.87–5.11)
RDW: 16.8 % — ABNORMAL HIGH (ref 11.5–15.5)
WBC: 2.8 10*3/uL — ABNORMAL LOW (ref 4.0–10.5)

## 2013-10-27 LAB — URINALYSIS, ROUTINE W REFLEX MICROSCOPIC
BILIRUBIN URINE: NEGATIVE
Glucose, UA: NEGATIVE mg/dL
Hgb urine dipstick: NEGATIVE
KETONES UR: NEGATIVE mg/dL
LEUKOCYTES UA: NEGATIVE
NITRITE: NEGATIVE
PROTEIN: NEGATIVE mg/dL
Specific Gravity, Urine: 1.009 (ref 1.005–1.030)
Urobilinogen, UA: 0.2 mg/dL (ref 0.0–1.0)
pH: 7 (ref 5.0–8.0)

## 2013-10-27 LAB — PRO B NATRIURETIC PEPTIDE: PRO B NATRI PEPTIDE: 16497 pg/mL — AB (ref 0–125)

## 2013-10-27 LAB — BASIC METABOLIC PANEL
Anion gap: 13 (ref 5–15)
BUN: 85 mg/dL — AB (ref 6–23)
CALCIUM: 8.1 mg/dL — AB (ref 8.4–10.5)
CO2: 24 meq/L (ref 19–32)
CREATININE: 1.76 mg/dL — AB (ref 0.50–1.10)
Chloride: 96 mEq/L (ref 96–112)
GFR calc Af Amer: 34 mL/min — ABNORMAL LOW (ref >90)
GFR calc non Af Amer: 30 mL/min — ABNORMAL LOW (ref >90)
Glucose, Bld: 94 mg/dL (ref 70–99)
Potassium: 4.2 mEq/L (ref 3.7–5.3)
Sodium: 133 mEq/L — ABNORMAL LOW (ref 137–147)

## 2013-10-27 LAB — CBG MONITORING, ED
GLUCOSE-CAPILLARY: 112 mg/dL — AB (ref 70–99)
GLUCOSE-CAPILLARY: 326 mg/dL — AB (ref 70–99)
GLUCOSE-CAPILLARY: 140 mg/dL — AB (ref 70–99)
Glucose-Capillary: 21 mg/dL — CL (ref 70–99)
Glucose-Capillary: 232 mg/dL — ABNORMAL HIGH (ref 70–99)
Glucose-Capillary: 79 mg/dL (ref 70–99)
Glucose-Capillary: 95 mg/dL (ref 70–99)
Glucose-Capillary: 62 mg/dL — ABNORMAL LOW (ref 70–99)

## 2013-10-27 LAB — I-STAT TROPONIN, ED: Troponin i, poc: 0.04 ng/mL (ref 0.00–0.08)

## 2013-10-27 LAB — I-STAT CG4 LACTIC ACID, ED: Lactic Acid, Venous: 0.83 mmol/L (ref 0.5–2.2)

## 2013-10-27 LAB — GLUCOSE, CAPILLARY: GLUCOSE-CAPILLARY: 75 mg/dL (ref 70–99)

## 2013-10-27 MED ORDER — DEXTROSE 10 % IV SOLN
INTRAVENOUS | Status: DC
  Administered 2013-10-27 – 2013-10-28 (×3): via INTRAVENOUS
  Administered 2013-10-28 – 2013-10-29 (×2): 100 mL/h via INTRAVENOUS

## 2013-10-27 MED ORDER — ALLOPURINOL 100 MG PO TABS
100.0000 mg | ORAL_TABLET | Freq: Every day | ORAL | Status: DC
  Administered 2013-10-28 – 2013-10-30 (×3): 100 mg via ORAL
  Filled 2013-10-27 (×4): qty 1

## 2013-10-27 MED ORDER — DEXTROSE 50 % IV SOLN
25.0000 mL | Freq: Once | INTRAVENOUS | Status: AC | PRN
  Administered 2013-10-27: 21:00:00 via INTRAVENOUS
  Filled 2013-10-27: qty 50

## 2013-10-27 MED ORDER — SODIUM CHLORIDE 0.9 % IJ SOLN
3.0000 mL | Freq: Two times a day (BID) | INTRAMUSCULAR | Status: DC
  Administered 2013-10-27 – 2013-10-29 (×3): 3 mL via INTRAVENOUS

## 2013-10-27 MED ORDER — VANCOMYCIN HCL IN DEXTROSE 1-5 GM/200ML-% IV SOLN
1000.0000 mg | Freq: Once | INTRAVENOUS | Status: AC
  Administered 2013-10-27: 1000 mg via INTRAVENOUS
  Filled 2013-10-27: qty 200

## 2013-10-27 MED ORDER — DEXTROSE 50 % IV SOLN
INTRAVENOUS | Status: AC
  Administered 2013-10-27: 50 mL
  Filled 2013-10-27: qty 50

## 2013-10-27 MED ORDER — TORSEMIDE 20 MG PO TABS
40.0000 mg | ORAL_TABLET | Freq: Two times a day (BID) | ORAL | Status: DC
  Filled 2013-10-27 (×3): qty 2

## 2013-10-27 MED ORDER — SPIRONOLACTONE 25 MG PO TABS
25.0000 mg | ORAL_TABLET | Freq: Every day | ORAL | Status: DC
  Administered 2013-10-28 – 2013-10-30 (×3): 25 mg via ORAL
  Filled 2013-10-27 (×3): qty 1

## 2013-10-27 MED ORDER — ISOSORB DINITRATE-HYDRALAZINE 20-37.5 MG PO TABS
2.0000 | ORAL_TABLET | Freq: Three times a day (TID) | ORAL | Status: DC
  Administered 2013-10-28 – 2013-10-30 (×7): 2 via ORAL
  Filled 2013-10-27 (×10): qty 2

## 2013-10-27 MED ORDER — ATORVASTATIN CALCIUM 10 MG PO TABS
10.0000 mg | ORAL_TABLET | Freq: Every day | ORAL | Status: DC
  Administered 2013-10-28 – 2013-10-29 (×2): 10 mg via ORAL
  Filled 2013-10-27 (×3): qty 1

## 2013-10-27 MED ORDER — DEXTROSE 50 % IV SOLN
1.0000 | Freq: Once | INTRAVENOUS | Status: AC
  Administered 2013-10-27: 50 mL via INTRAVENOUS
  Filled 2013-10-27: qty 50

## 2013-10-27 MED ORDER — VANCOMYCIN HCL 500 MG IV SOLR: 500.0000 mg | INTRAVENOUS | Status: DC

## 2013-10-27 MED ORDER — PIPERACILLIN-TAZOBACTAM 3.375 G IVPB
3.3750 g | Freq: Three times a day (TID) | INTRAVENOUS | Status: DC
  Administered 2013-10-27: 3.375 g via INTRAVENOUS
  Filled 2013-10-27: qty 50

## 2013-10-27 MED ORDER — ASPIRIN EC 81 MG PO TBEC
81.0000 mg | DELAYED_RELEASE_TABLET | Freq: Every day | ORAL | Status: DC
  Administered 2013-10-28 – 2013-10-30 (×3): 81 mg via ORAL
  Filled 2013-10-27 (×3): qty 1

## 2013-10-27 MED ORDER — DEXTROSE 50 % IV SOLN: 50.0000 mL | Freq: Once | INTRAVENOUS | Status: AC | PRN

## 2013-10-27 MED ORDER — CARVEDILOL 6.25 MG PO TABS
6.2500 mg | ORAL_TABLET | Freq: Two times a day (BID) | ORAL | Status: DC
  Administered 2013-10-28 (×2): 6.25 mg via ORAL
  Filled 2013-10-27 (×7): qty 1

## 2013-10-27 MED ORDER — COLCHICINE 0.6 MG PO TABS
0.3000 mg | ORAL_TABLET | Freq: Every day | ORAL | Status: DC
  Administered 2013-10-28 – 2013-10-30 (×3): 0.3 mg via ORAL
  Filled 2013-10-27 (×3): qty 0.5

## 2013-10-27 MED ORDER — VANCOMYCIN HCL 500 MG IV SOLR
500.0000 mg | INTRAVENOUS | Status: DC
  Filled 2013-10-27: qty 500

## 2013-10-27 MED ORDER — POTASSIUM CHLORIDE CRYS ER 20 MEQ PO TBCR
20.0000 meq | EXTENDED_RELEASE_TABLET | Freq: Every day | ORAL | Status: DC
  Administered 2013-10-28 – 2013-10-30 (×3): 20 meq via ORAL
  Filled 2013-10-27 (×4): qty 1

## 2013-10-27 NOTE — ED Provider Notes (Signed)
TIME SEEN: 4:00 PM  CHIEF COMPLAINT: Altered mental status, hypoglycemia  HPI: Patient is a 63 y.o. F with history of CHF, coronary artery disease, diabetes on glipizide, chronic kidney disease, hypertension, CAD who presents to the emergency department with altered mental status, hypoglycemia. Some reports that she was last seen normal yesterday. He reports this morning she was not feeling well and when he went to check on her this afternoon she was unresponsive, diaphoretic. In the ED, her glucose is 31. He reports she did take her glipizide this morning. She is not on insulin. She has had episodes of hypoglycemia before. Recently admitted for CHF exacerbation and possible pneumonia. No longer on antibiotics per son.  ROS: Unobtainable secondary to altered mental status  PAST MEDICAL HISTORY/PAST SURGICAL HISTORY:  Past Medical History  Diagnosis Date  . Hypertension   . Pneumonia   . CHF (congestive heart failure) 08/2012  . Anemia 08/2012  . UTI (urinary tract infection) 08/22/2012  . Ischemic cardiomyopathy     EF 30-35%  . Coronary artery disease   . Pulmonary hypertension   . NSVT (nonsustained ventricular tachycardia) 02/2013  . Diabetes mellitus     Reportedly diagnosed 2011 but no medication initiated until 04/2011 (Metformin)  . Diabetic peripheral neuropathy     Since 2013  . Thrombocytopenia   . CKD (chronic kidney disease)   . Chronic systolic CHF (congestive heart failure)     a. EF 35-40% grade II DD 02/2013. b. Previously required short-term milrinone.  Marland Kitchen Uncontrolled diabetes mellitus   . HTN (hypertension)   . Pulmonary HTN     a. Cath 02/2013: severe pulm HTN.  . CAD (coronary artery disease)     a. Cath 02/2013: severe 2V CAD - 100% mRCA with L-R collaterals; diffuse 80-95% subtotal OM2 (too diffusely diseased to attempt PCA), overall small caliber diffusely diseased coronaries c/w poorly controlled DM; treated medically.  . CKD (chronic kidney disease), stage IV   .  Ischemic cardiomyopathy   . Anemia     a. Prior normal EGD 03/2013. b. Progressive anemia 06/2013 in setting of transient hematemesis that had resolved.   . Thrombocytopenia     a. Liver imaging normal 03/2013.  . Edema     a. RUE edema with neg duplex for DVT 06/2013.  Marland Kitchen Protein calorie malnutrition   . Poor social situation   . Ascites     a. s/p paracentesis 03/2013.  . Valvular heart disease     a. Echo 06/2013: mild AI, mod MR, mod TR, mod PR.    MEDICATIONS:  Prior to Admission medications   Medication Sig Start Date End Date Taking? Authorizing Provider  allopurinol (ZYLOPRIM) 100 MG tablet Take 1 tablet (100 mg total) by mouth daily. 10/21/13 10/21/14  Lacy Duverney, PA-C  aspirin 81 MG EC tablet Take 1 tablet (81 mg total) by mouth daily. 04/08/13   Tresa Garter, MD  aspirin EC 81 MG tablet Take 81 mg by mouth daily.    Historical Provider, MD  atorvastatin (LIPITOR) 10 MG tablet Take 1 tablet (10 mg total) by mouth daily at 6 PM. 10/21/13   Lacy Duverney, PA-C  atorvastatin (LIPITOR) 20 MG tablet Take 1 tablet (20 mg total) by mouth daily at 6 PM. 07/10/13   Amy D Clegg, NP  carvedilol (COREG) 3.125 MG tablet Take 1 tablet (3.125 mg total) by mouth 2 (two) times daily with a meal. 07/10/13   Amy D Clegg, NP  carvedilol (COREG) 6.25  MG tablet Take 1 tablet (6.25 mg total) by mouth 2 (two) times daily. 10/21/13 10/21/14  Lacy Duverney, PA-C  colchicine 0.6 MG tablet Take 0.5 tablets (0.3 mg total) by mouth daily. 10/21/13   Lacy Duverney, PA-C  glipiZIDE (GLUCOTROL XL) 2.5 MG 24 hr tablet Take 1 tablet (2.5 mg total) by mouth daily with breakfast. 04/08/13   Tresa Garter, MD  glipiZIDE (GLUCOTROL XL) 2.5 MG 24 hr tablet Take 2.5 mg by mouth daily with breakfast.    Historical Provider, MD  hydrALAZINE (APRESOLINE) 100 MG tablet Take 1 tablet (100 mg total) by mouth 3 (three) times daily. 07/10/13   Amy D Ninfa Meeker, NP  isosorbide mononitrate (IMDUR) 60 MG 24 hr tablet Take 60 mg by mouth 2 (two)  times daily.    Historical Provider, MD  isosorbide-hydrALAZINE (BIDIL) 20-37.5 MG per tablet Take 2 tablets by mouth 3 (three) times daily. 10/21/13   Lacy Duverney, PA-C  Loperamide HCl (ANTI-DIARRHEAL PO) Take 2 tablets by mouth daily as needed (diarrhea).    Historical Provider, MD  potassium chloride SA (K-DUR,KLOR-CON) 20 MEQ tablet Take 1 tablet (20 mEq total) by mouth daily. 07/10/13   Amy D Clegg, NP  potassium chloride SA (K-DUR,KLOR-CON) 20 MEQ tablet Take 20 mEq by mouth daily.    Historical Provider, MD  spironolactone (ALDACTONE) 25 MG tablet Take 1 tablet (25 mg total) by mouth daily. 10/21/13   Lacy Duverney, PA-C  torsemide (DEMADEX) 20 MG tablet Take 2 tablets (40 mg total) by mouth 2 (two) times daily. 07/10/13   Amy D Ninfa Meeker, NP  torsemide (DEMADEX) 20 MG tablet Take 2 tablets (40 mg total) by mouth 2 (two) times daily. 10/21/13   Lacy Duverney, PA-C    ALLERGIES:  No Known Allergies  SOCIAL HISTORY:  History  Substance Use Topics  . Smoking status: Never Smoker   . Smokeless tobacco: Not on file  . Alcohol Use: No    FAMILY HISTORY: Family History  Problem Relation Age of Onset  . Diabetes Brother     deceased in 37C dt DM complications    EXAM: There were no vitals taken for this visit. CONSTITUTIONAL: Patient is an awake, following commands or answer questions, diaphoretic HEAD: Normocephalic EYES: Conjunctivae clear, PERRL ENT: normal nose; no rhinorrhea; moist mucous membranes; pharynx without lesions noted NECK: Supple, no meningismus, no LAD  CARD: RRR; S1 and S2 appreciated; no murmurs, no clicks, no rubs, no gallops RESP: Normal chest excursion without splinting or tachypnea; breath sounds clear and equal bilaterally; no wheezes, no rhonchi, no rales,  ABD/GI: Normal bowel sounds; non-distended; soft, non-tender, no rebound, no guarding BACK:  The back appears normal and is non-tender to palpation, there is no CVA tenderness EXT: Normal ROM in all joints;  non-tender to palpation; no edema; normal capillary refill; no cyanosis    SKIN: Normal color for age and race; warm, diaphoretic NEURO: Patient is not awake or alert, following commands or answering questions. PSYCH: The patient's mood and manner are appropriate. Grooming and personal hygiene are appropriate.  MEDICAL DECISION MAKING: Patient here with hypoglycemia. D50 given in patient's mental status improved. She began complaining of chest pain no shortness of breath. She began moving all of her extremities equally with no pronator drift. She reported normal sensation. Cranial nerves II through XII intact. She is hypothermic. This may be secondary to prolonged episode of hypoglycemia. We'll however treat for possible sepsis with broad-spectrum antibiotics given son reports she recently had pneumonia and  was hospitalized. We'll obtain cultures, chest x-ray, urine. Will continue to closely monitor her glucose. She will need admission. Will place bair hugger on patient.  ED PROGRESS:  Pt's glucose continues to drop. She's given multiple rounds of D50. We'll start on D10 infusion at 75 mL per hour given her history of CHF. Her temperature is improving with the [facility].  Labs show pancytopenia which is chronic. Chest x-ray shows edema but no infiltrate. Urine shows no sign of infection. Discussed with hospitalist for admission. Updated family.      EKG Interpretation  Date/Time:  Sunday October 27 2013 15:56:15 EDT Ventricular Rate:  51 PR Interval:  232 QRS Duration: 121 QT Interval:  546 QTC Calculation: 503 R Axis:   108 Text Interpretation:  Sinus rhythm Prolonged PR interval Nonspecific intraventricular conduction delay Consider anterior infarct No significant change since last tracing Confirmed by Lurlean Kernen,  DO, [place] ([id number]) on 10/27/2013 5:58:48 PM         CRITICAL CARE Performed by: Nyra Jabs   Total critical care time: 35 minutes  Critical care time was exclusive  of separately billable procedures and treating other patients.  Hypoglycemia on D. 10 infusion; hypothermia requiring bair hugger; multiple rechecks of temperature, glucose in mental status  Critical care was necessary to treat or prevent imminent or life-threatening deterioration.  Critical care was time spent personally by me on the following activities: development of treatment plan with patient and/or surrogate as well as nursing, discussions with consultants, evaluation of patient's response to treatment, examination of patient, obtaining history from patient or surrogate, ordering and performing treatments and interventions, ordering and review of laboratory studies, ordering and review of radiographic studies, pulse oximetry and re-evaluation of patient's condition.   [place], DO 10/28/13 0002

## 2013-10-27 NOTE — ED Notes (Signed)
Dr Audie Pinto and Dr Leonides Schanz at bedside.

## 2013-10-27 NOTE — ED Notes (Signed)
Pt arousable after D50 given.  Son interpreting.  Pt feels weak, able to lift arms, legs off bed.  Smile symmetrical.

## 2013-10-27 NOTE — H&P (Signed)
[facility] History and Physical  [name] KYH:[id number] DOB: 1950/05/09 DOA: 10/27/2013  Referring physician: EDP PCP: Angelica Chessman, MD   Chief Complaint: Hypoglycemia   HPI: [name] [name] is a 63 y.o. female who wasn't feeling well today per son, but they weren't able to check BGL at home.  Patient developed AMS and was brought in to the ED by son.  BGL was checked as part of the basic AMS work up and was found to be 21 in triage!  Patient was also hypothermic.  After D50 BGL came up to the 200s and with a bear hugger her temp has normalized, she is now back to baseline.  Regarding cause of hypoglycemia: she takes glipizide 2.5mg  daily, took this morning.  Did not accidentally overdose on this medication, just took her normal dose per son.  Son states she did eat a banana, an apple, and some sweat tea today, but hasnt been eating as well overall since her hospital discharge from the ICU 5 days ago (was in ICU from 9/8 to 9/15 for CHF exacerbation, respiratory failure, see discharge note from 9/15 visit for details).  Review of Systems: Systems reviewed.  As above, otherwise negative  Past Medical History  Diagnosis Date  . Hypertension   . Pneumonia   . CHF (congestive heart failure) 08/2012  . Anemia 08/2012  . UTI (urinary tract infection) 08/22/2012  . Ischemic cardiomyopathy     EF 30-35%  . Coronary artery disease   . Pulmonary hypertension   . NSVT (nonsustained ventricular tachycardia) 02/2013  . Diabetes mellitus     Reportedly diagnosed 2011 but no medication initiated until 04/2011 (Metformin)  . Diabetic peripheral neuropathy     Since 2013  . Thrombocytopenia   . CKD (chronic kidney disease)   . Chronic systolic CHF (congestive heart failure)     a. EF 35-40% grade II DD 02/2013. b. Previously required short-term milrinone.  Marland Kitchen Uncontrolled diabetes mellitus   . HTN (hypertension)   . Pulmonary HTN     a. Cath 02/2013: severe pulm HTN.  . CAD (coronary artery  disease)     a. Cath 02/2013: severe 2V CAD - 100% mRCA with L-R collaterals; diffuse 80-95% subtotal OM2 (too diffusely diseased to attempt PCA), overall small caliber diffusely diseased coronaries c/w poorly controlled DM; treated medically.  . CKD (chronic kidney disease), stage IV   . Ischemic cardiomyopathy   . Anemia     a. Prior normal EGD 03/2013. b. Progressive anemia 06/2013 in setting of transient hematemesis that had resolved.   . Thrombocytopenia     a. Liver imaging normal 03/2013.  . Edema     a. RUE edema with neg duplex for DVT 06/2013.  Marland Kitchen Protein calorie malnutrition   . Poor social situation   . Ascites     a. s/p paracentesis 03/2013.  . Valvular heart disease     a. Echo 06/2013: mild AI, mod MR, mod TR, mod PR.   Past Surgical History  Procedure Laterality Date  . Cesarean section    . Tubal ligation    . Esophagogastroduodenoscopy N/A 03/13/2013    Procedure: ESOPHAGOGASTRODUODENOSCOPY (EGD);  Surgeon: Winfield Cunas., MD;  Location: [facility] ENDOSCOPY;  Service: Endoscopy;  Laterality: N/A;  . Cardiac catheterization  02/2013    severe 2 vessel CAD   Social History:  reports that she has never smoked. She does not have any smokeless tobacco history on file. She reports that she does not drink  alcohol or use illicit drugs.  No Known Allergies  Family History  Problem Relation Age of Onset  . Diabetes Brother     deceased in 33X dt DM complications     Prior to Admission medications   Medication Sig Start Date End Date Taking? Authorizing Provider  allopurinol (ZYLOPRIM) 100 MG tablet Take 1 tablet (100 mg total) by mouth daily. 10/21/13 10/21/14 Yes Sahar Alene Mires, PA-C  aspirin 81 MG EC tablet Take 1 tablet (81 mg total) by mouth daily. 04/08/13  Yes Tresa Garter, MD  atorvastatin (LIPITOR) 10 MG tablet Take 1 tablet (10 mg total) by mouth daily at 6 PM. 10/21/13  Yes Sahar Alene Mires, PA-C  carvedilol (COREG) 6.25 MG tablet Take 1 tablet (6.25 mg total) by mouth 2  (two) times daily. 10/21/13 10/21/14 Yes Sahar Alene Mires, PA-C  colchicine 0.6 MG tablet Take 0.5 tablets (0.3 mg total) by mouth daily. 10/21/13  Yes Sahar Alene Mires, PA-C  glipiZIDE (GLUCOTROL XL) 2.5 MG 24 hr tablet Take 1 tablet (2.5 mg total) by mouth daily with breakfast. 04/08/13  Yes Olugbemiga E Jegede, MD  isosorbide-hydrALAZINE (BIDIL) 20-37.5 MG per tablet Take 2 tablets by mouth 3 (three) times daily. 10/21/13  Yes Sahar Osman, PA-C  potassium chloride SA (K-DUR,KLOR-CON) 20 MEQ tablet Take 20 mEq by mouth daily.   Yes Historical Provider, MD  spironolactone (ALDACTONE) 25 MG tablet Take 1 tablet (25 mg total) by mouth daily. 10/21/13  Yes Sahar Alene Mires, PA-C  torsemide (DEMADEX) 20 MG tablet Take 2 tablets (40 mg total) by mouth 2 (two) times daily. 10/21/13  Yes Lacy Duverney, PA-C   Physical Exam: Filed Vitals:   10/27/13 1906  BP:   Pulse:   Temp: 98.3 F (36.8 C)  Resp:     BP 108/49  Pulse 56  Temp(Src) 98.3 F (36.8 C) (Oral)  Resp 13  Wt 50.803 kg (112 lb)  SpO2 98%  General Appearance:    Alert, oriented, no distress, appears stated age  Head:    Normocephalic, atraumatic  Eyes:    PERRL, EOMI, sclera non-icteric        Nose:   Nares without drainage or epistaxis. Mucosa, turbinates normal  Throat:   Moist mucous membranes. Oropharynx without erythema or exudate.  Neck:   Supple. No carotid bruits.  No thyromegaly.  No lymphadenopathy.   Back:     No CVA tenderness, no spinal tenderness  Lungs:     Clear to auscultation bilaterally, without wheezes, rhonchi or rales  Chest wall:    No tenderness to palpitation  Heart:    Regular rate and rhythm without murmurs, gallops, rubs  Abdomen:     Soft, non-tender, nondistended, normal bowel sounds, no organomegaly  Genitalia:    deferred  Rectal:    deferred  Extremities:   No clubbing, cyanosis or edema.  Pulses:   2+ and symmetric all extremities  Skin:   Skin color, texture, turgor normal, no rashes or lesions  Lymph nodes:    Cervical, supraclavicular, and axillary nodes normal  Neurologic:   CNII-XII intact. Normal strength, sensation and reflexes      throughout    Labs on Admission:  Basic Metabolic Panel:  Recent Labs Lab 10/21/13 0403 10/27/13 1606  NA 137 133*  K 3.5* 4.2  CL 100 96  CO2 23 24  GLUCOSE 193* 94  BUN 77* 85*  CREATININE 1.63* 1.76*  CALCIUM 8.4 8.1*   Liver Function Tests: No results found for this basename:  AST, ALT, ALKPHOS, BILITOT, PROT, ALBUMIN,  in the last 168 hours No results found for this basename: LIPASE, AMYLASE,  in the last 168 hours No results found for this basename: AMMONIA,  in the last 168 hours CBC:  Recent Labs Lab 10/27/13 1606  WBC 2.8*  NEUTROABS 2.0  HGB 10.3*  HCT 31.4*  MCV 92.1  PLT 72*   Cardiac Enzymes: No results found for this basename: CKTOTAL, CKMB, CKMBINDEX, TROPONINI,  in the last 168 hours  BNP (last 3 results)  Recent Labs  06/25/13 1129 10/15/13 1318 10/27/13 1606  PROBNP 27880.0* 24313.0* 16497.0*   CBG:  Recent Labs Lab 10/27/13 1626 10/27/13 1653 10/27/13 1720 10/27/13 1757 10/27/13 1903  GLUCAP 112* 79 326* 140* 95    Radiological Exams on Admission: Dg Chest Portable 1 View  10/27/2013   CLINICAL DATA:  Short of breath.  EXAM: PORTABLE CHEST - 1 VIEW  COMPARISON:  06/27/2013.  FINDINGS: Mild central vascular congestion. Mild enlargement cardiac silhouette. Hazy opacity is noted at the lung bases consistent with small effusions. No overt pulmonary edema.  No mediastinal or hilar masses.  No pneumothorax.  Bony thorax is demineralized but grossly intact.  IMPRESSION: Mild congestive heart failure is suspected with central vascular congestion, mild cardiomegaly and small bilateral effusions.   Electronically Signed   By: Lajean Manes M.D.   On: 10/27/2013 16:42    EKG: Independently reviewed.  Assessment/Plan Principal Problem:   Hypoglycemia Active Problems:   Diabetes mellitus, type 2   Chronic kidney  disease (CKD), stage IV (severe)   Chronic systolic heart failure   Pancytopenia  Hypoglycemia - Improved after D50 but still went back down, now on D10 at 75 cc/hr.  Q2H BGL checks, holding glipizide.  DM2 - holding glipizide, Q2H BGL checks, see above  CKD stage 4 - chronic, due to cardio-renal syndrome  Chronic systolic CHF - chronic and severe, try to get patient off of D10 drip and back on to 1L / day fluid restriction ASAP!  Watch for fluid overload, CHF exacerbation just landed her in the ICU on a vent 12 days ago!  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  HGB stable since discharge at 10.3.  Did have minor LGIB during ICU stay last week.  Endoscopy not done due to this not being severe.     Code Status: Full Code  Family Communication: Son at bedside Disposition Plan: Admit to obs   Time spent: 70 min  GARDNER, JARED M. [facility] Pager [phone]  If 7AM-7PM, please contact the day team taking care of the patient [email or web address] Password [email or web address] 10/27/2013, 8:00 PM

## 2013-10-27 NOTE — ED Notes (Signed)
1645 CBG 79

## 2013-10-27 NOTE — ED Notes (Signed)
MD at bedside. 

## 2013-10-27 NOTE — ED Notes (Signed)
D50 given.

## 2013-10-27 NOTE — Progress Notes (Addendum)
ANTIBIOTIC CONSULT NOTE - INITIAL  Pharmacy Consult for Vancomycin and Zosyn Indication: sepsis  No Known Allergies  Patient Measurements:   Per patient 64.5 inches, wt = 112 lbs Adjusted Body Weight:   Vital Signs: Temp: 93.3 F (34.1 C) (09/20 1621) Temp src: Rectal (09/20 1621) BP: 151/66 mmHg (09/20 1630) Pulse Rate: 51 (09/20 1630)  Labs: No results found for this basename: WBC, HGB, PLT, LABCREA, CREATININE,  in the last 72 hours The CrCl is unknown because both a height and weight (above a minimum accepted value) are required for this calculation. No results found for this basename: VANCOTROUGH, Corlis Leak, VANCORANDOM, [place], GENTPEAK, GENTRANDOM, TOBRATROUGH, TOBRAPEAK, TOBRARND, AMIKACINPEAK, AMIKACINTROU, AMIKACIN,  in the last 72 hours   Microbiology: Recent Results (from the past 720 hour(s))  CULTURE, BLOOD (ROUTINE X 2)     Status: None   Collection Time    10/15/13  2:15 PM      Result Value Ref Range Status   Specimen Description BLOOD LEFT ANTECUBITAL   Final   Special Requests BOTTLES DRAWN AEROBIC AND ANAEROBIC 5CC   Final   Culture  Setup Time     Final   Value: 10/15/2013 18:51     Performed at [facility]   Culture     Final   Value: NO GROWTH 5 DAYS     Performed at [facility]   Report Status 10/21/2013 FINAL   Final  CULTURE, BLOOD (ROUTINE X 2)     Status: None   Collection Time    10/15/13  2:30 PM      Result Value Ref Range Status   Specimen Description BLOOD HAND RIGHT   Final   Special Requests BOTTLES DRAWN AEROBIC AND ANAEROBIC 5CC   Final   Culture  Setup Time     Final   Value: 10/15/2013 18:51     Performed at [facility]   Culture     Final   Value: NO GROWTH 5 DAYS     Performed at [facility]   Report Status 10/21/2013 FINAL   Final  MRSA PCR SCREENING     Status: None   Collection Time    10/15/13  4:43 PM      Result Value Ref Range Status   MRSA by PCR NEGATIVE  NEGATIVE Final   Comment:            The GeneXpert MRSA Assay (FDA     approved for NASAL specimens     only), is one component of a     comprehensive MRSA colonization     surveillance program. It is not     intended to diagnose MRSA     infection nor to guide or     monitor treatment for     MRSA infections.  CULTURE, RESPIRATORY (NON-EXPECTORATED)     Status: None   Collection Time    10/15/13  8:05 PM      Result Value Ref Range Status   Specimen Description TRACHEAL ASPIRATE   Final   Special Requests NONE   Final   Gram Stain     Final   Value: FEW WBC PRESENT,BOTH PMN AND MONONUCLEAR     RARE SQUAMOUS EPITHELIAL CELLS PRESENT     RARE GRAM POSITIVE COCCI     IN PAIRS IN CLUSTERS     Performed at [facility]   Culture     Final   Value: Non-Pathogenic Oropharyngeal-type Flora Isolated.  Performed at [facility]   Report Status 10/18/2013 FINAL   Final    Medical History: Past Medical History  Diagnosis Date  . Hypertension   . Pneumonia   . CHF (congestive heart failure) 08/2012  . Anemia 08/2012  . UTI (urinary tract infection) 08/22/2012  . Ischemic cardiomyopathy     EF 30-35%  . Coronary artery disease   . Pulmonary hypertension   . NSVT (nonsustained ventricular tachycardia) 02/2013  . Diabetes mellitus     Reportedly diagnosed 2011 but no medication initiated until 04/2011 (Metformin)  . Diabetic peripheral neuropathy     Since 2013  . Thrombocytopenia   . CKD (chronic kidney disease)   . Chronic systolic CHF (congestive heart failure)     a. EF 35-40% grade II DD 02/2013. b. Previously required short-term milrinone.  Marland Kitchen Uncontrolled diabetes mellitus   . HTN (hypertension)   . Pulmonary HTN     a. Cath 02/2013: severe pulm HTN.  . CAD (coronary artery disease)     a. Cath 02/2013: severe 2V CAD - 100% mRCA with L-R collaterals; diffuse 80-95% subtotal OM2 (too diffusely diseased to attempt PCA), overall small caliber diffusely diseased coronaries c/w  poorly controlled DM; treated medically.  . CKD (chronic kidney disease), stage IV   . Ischemic cardiomyopathy   . Anemia     a. Prior normal EGD 03/2013. b. Progressive anemia 06/2013 in setting of transient hematemesis that had resolved.   . Thrombocytopenia     a. Liver imaging normal 03/2013.  . Edema     a. RUE edema with neg duplex for DVT 06/2013.  Marland Kitchen Protein calorie malnutrition   . Poor social situation   . Ascites     a. s/p paracentesis 03/2013.  . Valvular heart disease     a. Echo 06/2013: mild AI, mod MR, mod TR, mod PR.    Assessment: 63 yo female admitted with apnea, VDRF and pharmacy asked to begin empiric antibiotic coverage with vancomycin and Zosyn.   Most recent Scr 1.63 from 9/14, estimated CrCl ~ 40 ml/min.  Today's labs pending.  Goal of Therapy:  Vancomycin trough level 15-20 mcg/ml  Plan:  1.  Vancomycin 1g IV x 1, then 500 mg IV q 24 hrs. 2. Zosyn 3.375g IV q 24 hrs. 3. Will f/u labs and adjust dosing as needed.  Uvaldo Rising, BCPS  Clinical Pharmacist Pager [phone]  10/27/2013 5:20 PM    Addendum: Antibiotics discontinued.  Owings, Pharm.D., BCPS Clinical Pharmacist Pager: [phone] 10/27/2013 7:38 PM

## 2013-10-27 NOTE — ED Notes (Signed)
Son states his mother was not feeling well today. He thinks her blood sugar was low but he was not able to check it at home. Pt is moaning in triage but will not answer questions. Her blood sugar is 21. Taken to pod a room

## 2013-10-27 NOTE — ED Notes (Signed)
Pt has eyes open and appears more alert, states she feels better.

## 2013-10-28 ENCOUNTER — Inpatient Hospital Stay (HOSPITAL_COMMUNITY): Admit: 2013-10-28 | Payer: Medicaid Other

## 2013-10-28 ENCOUNTER — Encounter (HOSPITAL_COMMUNITY): Payer: Self-pay | Admitting: *Deleted

## 2013-10-28 DIAGNOSIS — E1169 Type 2 diabetes mellitus with other specified complication: Secondary | ICD-10-CM | POA: Diagnosis present

## 2013-10-28 DIAGNOSIS — I509 Heart failure, unspecified: Secondary | ICD-10-CM | POA: Diagnosis present

## 2013-10-28 DIAGNOSIS — R4182 Altered mental status, unspecified: Secondary | ICD-10-CM | POA: Diagnosis present

## 2013-10-28 DIAGNOSIS — I5023 Acute on chronic systolic (congestive) heart failure: Secondary | ICD-10-CM

## 2013-10-28 DIAGNOSIS — E1149 Type 2 diabetes mellitus with other diabetic neurological complication: Secondary | ICD-10-CM | POA: Diagnosis present

## 2013-10-28 DIAGNOSIS — I2589 Other forms of chronic ischemic heart disease: Secondary | ICD-10-CM | POA: Diagnosis present

## 2013-10-28 DIAGNOSIS — N189 Chronic kidney disease, unspecified: Secondary | ICD-10-CM | POA: Diagnosis present

## 2013-10-28 DIAGNOSIS — E1142 Type 2 diabetes mellitus with diabetic polyneuropathy: Secondary | ICD-10-CM | POA: Diagnosis present

## 2013-10-28 DIAGNOSIS — R680 Hypothermia, not associated with low environmental temperature: Secondary | ICD-10-CM | POA: Diagnosis present

## 2013-10-28 DIAGNOSIS — G9340 Encephalopathy, unspecified: Secondary | ICD-10-CM | POA: Diagnosis present

## 2013-10-28 DIAGNOSIS — Z7982 Long term (current) use of aspirin: Secondary | ICD-10-CM | POA: Diagnosis not present

## 2013-10-28 DIAGNOSIS — D61818 Other pancytopenia: Secondary | ICD-10-CM | POA: Diagnosis present

## 2013-10-28 DIAGNOSIS — I2789 Other specified pulmonary heart diseases: Secondary | ICD-10-CM | POA: Diagnosis present

## 2013-10-28 DIAGNOSIS — I5022 Chronic systolic (congestive) heart failure: Secondary | ICD-10-CM | POA: Diagnosis present

## 2013-10-28 DIAGNOSIS — I2510 Atherosclerotic heart disease of native coronary artery without angina pectoris: Secondary | ICD-10-CM | POA: Diagnosis present

## 2013-10-28 DIAGNOSIS — I130 Hypertensive heart and chronic kidney disease with heart failure and stage 1 through stage 4 chronic kidney disease, or unspecified chronic kidney disease: Secondary | ICD-10-CM | POA: Diagnosis present

## 2013-10-28 DIAGNOSIS — N184 Chronic kidney disease, stage 4 (severe): Secondary | ICD-10-CM | POA: Diagnosis present

## 2013-10-28 DIAGNOSIS — Z79899 Other long term (current) drug therapy: Secondary | ICD-10-CM | POA: Diagnosis not present

## 2013-10-28 LAB — GLUCOSE, CAPILLARY
GLUCOSE-CAPILLARY: 101 mg/dL — AB (ref 70–99)
GLUCOSE-CAPILLARY: 107 mg/dL — AB (ref 70–99)
GLUCOSE-CAPILLARY: 76 mg/dL (ref 70–99)
GLUCOSE-CAPILLARY: 85 mg/dL (ref 70–99)
GLUCOSE-CAPILLARY: 86 mg/dL (ref 70–99)
GLUCOSE-CAPILLARY: 90 mg/dL (ref 70–99)
Glucose-Capillary: 102 mg/dL — ABNORMAL HIGH (ref 70–99)
Glucose-Capillary: 109 mg/dL — ABNORMAL HIGH (ref 70–99)
Glucose-Capillary: 143 mg/dL — ABNORMAL HIGH (ref 70–99)
Glucose-Capillary: 153 mg/dL — ABNORMAL HIGH (ref 70–99)
Glucose-Capillary: 46 mg/dL — ABNORMAL LOW (ref 70–99)
Glucose-Capillary: 64 mg/dL — ABNORMAL LOW (ref 70–99)
Glucose-Capillary: 77 mg/dL (ref 70–99)
Glucose-Capillary: 78 mg/dL (ref 70–99)
Glucose-Capillary: 80 mg/dL (ref 70–99)

## 2013-10-28 LAB — BASIC METABOLIC PANEL
ANION GAP: 11 (ref 5–15)
BUN: 80 mg/dL — ABNORMAL HIGH (ref 6–23)
CHLORIDE: 91 meq/L — AB (ref 96–112)
CO2: 25 meq/L (ref 19–32)
CREATININE: 1.8 mg/dL — AB (ref 0.50–1.10)
Calcium: 7.9 mg/dL — ABNORMAL LOW (ref 8.4–10.5)
GFR calc Af Amer: 33 mL/min — ABNORMAL LOW (ref >90)
GFR calc non Af Amer: 29 mL/min — ABNORMAL LOW (ref >90)
Glucose, Bld: 93 mg/dL (ref 70–99)
Potassium: 3.9 mEq/L (ref 3.7–5.3)
SODIUM: 127 meq/L — AB (ref 137–147)

## 2013-10-28 LAB — URINE CULTURE
COLONY COUNT: NO GROWTH
CULTURE: NO GROWTH

## 2013-10-28 LAB — CBC
HCT: 27.4 % — ABNORMAL LOW (ref 36.0–46.0)
HEMOGLOBIN: 9.3 g/dL — AB (ref 12.0–15.0)
MCH: 30.2 pg (ref 26.0–34.0)
MCHC: 33.9 g/dL (ref 30.0–36.0)
MCV: 89 fL (ref 78.0–100.0)
PLATELETS: 67 10*3/uL — AB (ref 150–400)
RBC: 3.08 MIL/uL — AB (ref 3.87–5.11)
RDW: 16.8 % — ABNORMAL HIGH (ref 11.5–15.5)
WBC: 3.7 10*3/uL — ABNORMAL LOW (ref 4.0–10.5)

## 2013-10-28 LAB — HEMOGLOBIN A1C
Hgb A1c MFr Bld: 6.2 % — ABNORMAL HIGH (ref <5.7)
Mean Plasma Glucose: 131 mg/dL — ABNORMAL HIGH (ref <117)

## 2013-10-28 MED ORDER — FUROSEMIDE 10 MG/ML IJ SOLN
40.0000 mg | Freq: Two times a day (BID) | INTRAMUSCULAR | Status: DC
  Administered 2013-10-28 – 2013-10-29 (×2): 40 mg via INTRAVENOUS
  Filled 2013-10-28 (×4): qty 4

## 2013-10-28 MED ORDER — DEXTROSE 50 % IV SOLN
25.0000 mL | Freq: Once | INTRAVENOUS | Status: AC
  Administered 2013-10-28: 25 mL via INTRAVENOUS
  Filled 2013-10-28: qty 50

## 2013-10-28 NOTE — Evaluation (Signed)
Physical Therapy Evaluation Patient Details Name: [name] [name] MRN: [id number] DOB: 08-10-1950 Today's Date: 10/28/2013   History of Present Illness  63 year old readmitted with hypoglycemia from recent hospitlal stay with CHF.  Pt originaly from [place] and has been in [place] for 7 years.  PMH - diabetes and kidney disease.    Clinical Impression  Pt did well today.  She walked 400 feet with RW and SBA.  Pt with no loss of balance.  Pt did stop 3 times and take deep breaths.  Pts O2 sats at rest on RA 100 % and after walking 400 feet on RW 100%.  Pts blood sugar before walk was 85 (after eating all her breakfast) - nursing aware and going to give pt extra to eat after her walk.  Pt tolerated the walk well and complained of her knees being tired after the walk but no pain.  Son not present to discuss help at home/DC plans.    Follow Up Recommendations No PT follow up    Equipment Recommendations  None recommended by PT    Recommendations for Other Services       Precautions / Restrictions Precautions Precautions: Fall Restrictions Weight Bearing Restrictions: No      Mobility  Bed Mobility Overal bed mobility: Modified Independent Bed Mobility: Supine to Sit              Transfers Overall transfer level: Needs assistance   Transfers: Sit to/from Stand Sit to Stand: Supervision Stand pivot transfers: Min guard          Ambulation/Gait Ambulation/Gait assistance: Min guard Ambulation Distance (Feet): 400 Feet Assistive device: Rolling walker (2 wheeled) Gait Pattern/deviations: Step-through pattern     General Gait Details: Pt reprots she uses RW all the time.  I tried to assess her balance by having her walk last 20 feet with no device.  Pt reaching and hanging on furniture etc and not confident without RW.  she did  great with RW and no loss of balance noticed  Stairs            Wheelchair Mobility    Modified Rankin (Stroke Patients Only)        Balance                                             Pertinent Vitals/Pain Pain Assessment: No/denies pain    Home Living Family/patient expects to be discharged to:: Private residence                 Additional Comments: son not present for eval.  Pt speaks a little English and the best I could gather was pt lives with son and daughter (who doesnt talk).  Pt uses RW all the time    Prior Function                 Hand Dominance        Extremity/Trunk Assessment               Lower Extremity Assessment: Generalized weakness      Cervical / Trunk Assessment: Normal  Communication   Communication: Prefers language other than English  Cognition Arousal/Alertness: Awake/alert Behavior During Therapy: WFL for tasks assessed/performed Overall Cognitive Status: Difficult to assess  General Comments      Exercises        Assessment/Plan    PT Assessment Patient needs continued PT services  PT Diagnosis Generalized weakness   PT Problem List Decreased activity tolerance  PT Treatment Interventions Gait training;Functional mobility training;Therapeutic activities;Therapeutic exercise;Patient/family education   PT Goals (Current goals can be found in the Care Plan section) Acute Rehab PT Goals Patient Stated Goal: not stated PT Goal Formulation: With patient Time For Goal Achievement: 11/08/13 Potential to Achieve Goals: Good    Frequency Min 3X/week   Barriers to discharge        Co-evaluation               End of Session Equipment Utilized During Treatment: Gait belt Activity Tolerance: Patient tolerated treatment well Patient left: in chair;with call bell/phone within reach Nurse Communication: Mobility status         Time: 3546-5681 PT Time Calculation (min): 29 min   Charges:   PT Evaluation $Initial PT Evaluation Tier I: 1 Procedure PT Treatments $Gait Training: 8-22  mins   PT G Codes:          Loyal Buba 10/28/2013, 11:36 AM 10/28/2013   Rande Lawman, PT

## 2013-10-28 NOTE — Evaluation (Signed)
Occupational Therapy Evaluation Patient Details Name: [name] [name] MRN: [id number] DOB: 08-08-1950 Today's Date: 10/28/2013    History of Present Illness 63 year old readmitted with hypoglycemia from recent hospitlal stay with CHF.  Pt originaly from [place] and has been in [place] for 7 years.  PMH - diabetes and kidney disease.     Clinical Impression   Pt admitted with above. Feel pt will benefit from acute OT to increase independence/safety prior to d/c home.    Follow Up Recommendations  No OT follow up;Supervision - Intermittent    Equipment Recommendations  3 in 1 bedside comode    Recommendations for Other Services       Precautions / Restrictions Precautions Precautions: Fall Restrictions Weight Bearing Restrictions: No      Mobility Bed Mobility Overal bed mobility: Modified Independent                Transfers Overall transfer level: Needs assistance   Transfers: Sit to/from Stand Sit to Stand: Supervision         General transfer comment: cues for hand placement    Balance                                            ADL Overall ADL's : Needs assistance/impaired                     Lower Body Dressing: Supervision/safety;Sit to/from stand   Toilet Transfer: Supervision/safety;Ambulation;RW (bed)           Functional mobility during ADLs: Supervision/safety;Rolling walker-cues to step closer to walker General ADL Comments: Recommended pt sit for LB ADLs. Explained use of 3 in 1 for over toilet and for shower chair.     Vision                     Perception     Praxis      Pertinent Vitals/Pain Pain Assessment: No/denies pain     Hand Dominance     Extremity/Trunk Assessment Upper Extremity Assessment Upper Extremity Assessment: Overall WFL for tasks assessed   Lower Extremity Assessment Lower Extremity Assessment: Defer to PT evaluation       Communication Communication Communication:  Prefers language other than [place] (cambodian; used phone interpreter)   Cognition Arousal/Alertness: Awake/alert Behavior During Therapy: WFL for tasks assessed/performed Overall Cognitive Status: No family/caregiver present to determine baseline cognitive functioning Area of Impairment: Orientation Orientation Level: Disoriented to;Place;Time                 General Comments       Exercises       Shoulder Instructions      Home Living Family/patient expects to be discharged to:: Private residence Living Arrangements: Children (son and daughter who doesn't talk) Available Help at Discharge: Family;Available PRN/intermittently Type of Home: House Home Access: Level entry     Home Layout: One level     Bathroom Shower/Tub: Teacher, early years/pre: Standard     Home Equipment: Cane - single point   Additional Comments: used phone interpreter       Prior Functioning/Environment               OT Diagnosis: Generalized weakness   OT Problem List: Decreased knowledge of use of DME or AE;Decreased knowledge of precautions;Decreased strength   OT Treatment/Interventions: Self-care/ADL training;DME  and/or AE instruction;Therapeutic activities;Patient/family education;Balance training;Cognitive remediation/compensation    OT Goals(Current goals can be found in the care plan section) Acute Rehab OT Goals Patient Stated Goal: not stated OT Goal Formulation: With patient Time For Goal Achievement: 11/04/13 Potential to Achieve Goals: Good ADL Goals Pt Will Perform Tub/Shower Transfer: Tub transfer;with modified independence;ambulating;3 in 1;rolling walker  OT Frequency: Min 2X/week   Barriers to D/C:            Co-evaluation              End of Session Equipment Utilized During Treatment: Gait belt;Rolling walker  Activity Tolerance: Patient tolerated treatment well Patient left: in bed;with call bell/phone within reach;with bed alarm  set   Time: 0037-0488 OT Time Calculation (min): 16 min Charges:  OT General Charges $OT Visit: 1 Procedure OT Evaluation $Initial OT Evaluation Tier I: 1 Procedure G-CodesBenito Mccreedy OTR/L [id number] 10/28/2013, 6:14 PM

## 2013-10-28 NOTE — Progress Notes (Signed)
UR completed 

## 2013-10-28 NOTE — Progress Notes (Signed)
Patient's CBG 46 @ 0030.  Patient given 24 grams fast acting carbs (1 can regular Sprite) with an increase of CBG to 76 after 15 minutes.  Patient then ate a frozen dinner with 1 can of regular Sprite.  CBG 102 @ 0149 but decreased to 64 by 0337. Patient given 8 oz orange juice and Dr. Rogue Bussing notified.  New orders received and completed to give patient 25 ml D50 and increase 10% Dextrose IVF from 75 to 100 cc/hr.  Will continue to closely monitor patient.    Kyerra Vargo,RN

## 2013-10-28 NOTE — Progress Notes (Signed)
Notified Dr. Karleen Hampshire of BRB per rectum.

## 2013-10-28 NOTE — Progress Notes (Signed)
  HF team aware of [name] [name]'s readmission for hypoglycemia. I have reviewed chart. HF currently seems well compensated. Please contact us for any HF related issues.  We will place f/u appointment with [facility]  on her chart for next week.    Daniel Bensimhon,MD 10:38 AM

## 2013-10-28 NOTE — Progress Notes (Signed)
[facility] PROGRESS NOTE  [place] CBJ:[id number] DOB: [birth year] DOA: 10/27/2013 PCP: Angelica Chessman, MD Interim summary: [name] [name] is a 63 y.o. female was found to be encephalopathic. Son called EMS and she was found to be hypoglycemic. She is a diabetic and is on glucotrol. She was admitted to medical service for further evaluation. We stp[[ed the glucotrol and she was started on IV dextrose.   Assessment/Plan:  Hypoglycemia - Improved after D50 but still went back down, now on D10 at 75 cc/hr. Q2H BGL checks, holding glipizide.  DM2 - holding glipizide, Q2H BGL checks, last hgba1c is 5.9 . Repeat hgba1c ordered and pending.  CKD stage 4 - chronic, due to cardio-renal syndrome  Chronic systolic CHF - chronic and severe, on dextrose fluids currently. We will try stop the fluids by the end of the day.  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 HGB stable since discharge at 10.3. Did have minor LGIB during ICU stay last week. Endoscopy not done due to this not being severe.     Code Status: full code.  Family Communication: discussed with son over the phone.  Disposition Plan: pending.    Consultants:  none  Procedures:  none     HPI/Subjective: Comfortable, denies any pain, sitting in the chair.   Objective: Filed Vitals:   10/28/13 1425  BP: 118/65  Pulse: 62  Temp: 97.6 F (36.4 C)  Resp: 18    Intake/Output Summary (Last 24 hours) at 10/28/13 1536 Last data filed at 10/28/13 1230  Gross per 24 hour  Intake   1339 ml  Output      0 ml  Net   1339 ml   Filed Weights   10/27/13 1717 10/27/13 2138  Weight: 50.803 kg (112 lb) 49.9 kg (110 lb 0.2 oz)    Exam:   General:  Alert afebrile comfortable  Cardiovascular: s1s2 normal,  Respiratory: chest clear to ausculatation, no wheezing heard  Abdomen: soft NT nd  bs+  Musculoskeletal: no pedal edema.   Data Reviewed: Basic Metabolic Panel:  Recent Labs Lab 10/27/13 1606 10/28/13 0550  NA 133* 127*  K 4.2 3.9  CL 96 91*  CO2 24 25  GLUCOSE 94 93  BUN 85* 80*  CREATININE 1.76* 1.80*  CALCIUM 8.1* 7.9*   Liver Function Tests: No results found for this basename: AST, ALT, ALKPHOS, BILITOT, PROT, ALBUMIN,  in the last 168 hours No results found for this basename: LIPASE, AMYLASE,  in the last 168 hours No results found for this basename: AMMONIA,  in the last 168 hours CBC:  Recent Labs Lab 10/27/13 1606 10/28/13 0550  WBC 2.8* 3.7*  NEUTROABS 2.0  --   HGB 10.3* 9.3*  HCT 31.4* 27.4*  MCV 92.1 89.0  PLT 72* 67*   Cardiac Enzymes: No results found for this basename: CKTOTAL, CKMB, CKMBINDEX, TROPONINI,  in the last 168 hours BNP (last 3 results)  Recent Labs  06/25/13 1129 10/15/13 1318 10/27/13 1606  PROBNP 27880.0* 24313.0* 16497.0*   CBG:  Recent Labs Lab 10/28/13 0617 10/28/13 0730 10/28/13 0949 10/28/13 1101 10/28/13 1326  GLUCAP 80 153* 101* 85 78    Recent Results (from the past 240 hour(s))  CULTURE, BLOOD (ROUTINE X 2)     Status: None   Collection Time    10/27/13  4:40 PM  Result Value Ref Range Status   Specimen Description BLOOD RIGHT HAND   Final   Special Requests BOTTLES DRAWN AEROBIC AND ANAEROBIC 5 CC   Final   Culture  Setup Time     Final   Value: 10/27/2013 22:00     Performed at [facility]   Culture     Final   Value:        BLOOD CULTURE RECEIVED NO GROWTH TO DATE CULTURE WILL BE HELD FOR 5 DAYS BEFORE ISSUING A FINAL NEGATIVE REPORT     Performed at [facility]   Report Status PENDING   Incomplete  CULTURE, BLOOD (ROUTINE X 2)     Status: None   Collection Time    10/27/13  4:45 PM      Result Value Ref Range Status   Specimen Description BLOOD LEFT ARM   Final   Special Requests BOTTLES DRAWN AEROBIC ONLY 3 CC   Final   Culture  Setup Time     Final    Value: 10/27/2013 22:00     Performed at [facility]   Culture     Final   Value:        BLOOD CULTURE RECEIVED NO GROWTH TO DATE CULTURE WILL BE HELD FOR 5 DAYS BEFORE ISSUING A FINAL NEGATIVE REPORT     Performed at [facility]   Report Status PENDING   Incomplete     Studies: Dg Chest Portable 1 View  10/27/2013   CLINICAL DATA:  Short of breath.  EXAM: PORTABLE CHEST - 1 VIEW  COMPARISON:  06/27/2013.  FINDINGS: Mild central vascular congestion. Mild enlargement cardiac silhouette. Hazy opacity is noted at the lung bases consistent with small effusions. No overt pulmonary edema.  No mediastinal or hilar masses.  No pneumothorax.  Bony thorax is demineralized but grossly intact.  IMPRESSION: Mild congestive heart failure is suspected with central vascular congestion, mild cardiomegaly and small bilateral effusions.   Electronically Signed   By: Lajean Manes M.D.   On: 10/27/2013 16:42    Scheduled Meds: . allopurinol  100 mg Oral Daily  . aspirin EC  81 mg Oral Daily  . atorvastatin  10 mg Oral q1800  . carvedilol  6.25 mg Oral BID WC  . colchicine  0.3 mg Oral Daily  . furosemide  40 mg Intravenous Q12H  . isosorbide-hydrALAZINE  2 tablet Oral TID  . potassium chloride SA  20 mEq Oral Daily  . sodium chloride  3 mL Intravenous Q12H  . spironolactone  25 mg Oral Daily   Continuous Infusions: . dextrose 100 mL/hr at 10/28/13 0920    Principal Problem:   Hypoglycemia Active Problems:   Diabetes mellitus, type 2   Chronic kidney disease (CKD), stage IV (severe)   Chronic systolic heart failure   Pancytopenia    Time spent: 25 minutes.     [facility] Pager [phone]. If 7PM-7AM, please contact night-coverage at [email or web address], password [facility] 10/28/2013, 3:36 PM  LOS: 1 day

## 2013-10-29 DIAGNOSIS — N189 Chronic kidney disease, unspecified: Secondary | ICD-10-CM

## 2013-10-29 DIAGNOSIS — E1129 Type 2 diabetes mellitus with other diabetic kidney complication: Secondary | ICD-10-CM

## 2013-10-29 LAB — BASIC METABOLIC PANEL
ANION GAP: 12 (ref 5–15)
BUN: 82 mg/dL — ABNORMAL HIGH (ref 6–23)
CALCIUM: 7.9 mg/dL — AB (ref 8.4–10.5)
CO2: 23 meq/L (ref 19–32)
Chloride: 89 mEq/L — ABNORMAL LOW (ref 96–112)
Creatinine, Ser: 1.87 mg/dL — ABNORMAL HIGH (ref 0.50–1.10)
GFR calc Af Amer: 32 mL/min — ABNORMAL LOW (ref >90)
GFR calc non Af Amer: 28 mL/min — ABNORMAL LOW (ref >90)
GLUCOSE: 103 mg/dL — AB (ref 70–99)
Potassium: 4.8 mEq/L (ref 3.7–5.3)
SODIUM: 124 meq/L — AB (ref 137–147)

## 2013-10-29 LAB — GLUCOSE, CAPILLARY
GLUCOSE-CAPILLARY: 117 mg/dL — AB (ref 70–99)
GLUCOSE-CAPILLARY: 124 mg/dL — AB (ref 70–99)
GLUCOSE-CAPILLARY: 139 mg/dL — AB (ref 70–99)
Glucose-Capillary: 118 mg/dL — ABNORMAL HIGH (ref 70–99)
Glucose-Capillary: 121 mg/dL — ABNORMAL HIGH (ref 70–99)
Glucose-Capillary: 122 mg/dL — ABNORMAL HIGH (ref 70–99)
Glucose-Capillary: 124 mg/dL — ABNORMAL HIGH (ref 70–99)
Glucose-Capillary: 188 mg/dL — ABNORMAL HIGH (ref 70–99)
Glucose-Capillary: 98 mg/dL (ref 70–99)

## 2013-10-29 MED ORDER — TORSEMIDE 20 MG PO TABS
40.0000 mg | ORAL_TABLET | Freq: Two times a day (BID) | ORAL | Status: DC
Start: 2013-10-30 — End: 2013-10-30
  Administered 2013-10-30: 40 mg via ORAL
  Filled 2013-10-29 (×2): qty 2

## 2013-10-29 NOTE — Progress Notes (Signed)
[facility] PROGRESS NOTE  [name] [name] GGE:[id number] DOB: 30-Sep-1950 DOA: 10/27/2013 PCP: Angelica Chessman, MD Interim summary: [name] [name] is a 63 y.o. Female with h/o dM, on glucotrol at home  was found altered by her son.  Son called EMS and she was found to be hypoglycemic. She is a diabetic and is on glucotrol. She was admitted to medical service for further evaluation. We stopped the glucotrol and she was started on IV dextrose. She did not have any more hypoglycemic episodes and IV fluds have been stopped. Repeat BMP today showed worsening of her hyponatremia.   Assessment/Plan:  Hypoglycemia -probably from the glipizide . Stopped it. No more episodes of hypoglycemia off the dextrose drip.  DM2 - holding glipizide, Q2H BGL checks, last hgba1c is 5.9 . Repeat hgba1c ordered and is 6.2. Control with diet .  CKD stage 4 - chronic, due to cardio-renal syndrome  Chronic systolic CHF - chronic and severe, resume home medications.   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 HGB stable since discharge at 10.3. Did have minor LGIB during ICU stay last week. Endoscopy not done due to this not being severe. An episode of slight BPR. None after that episode. Repeat CBC In am and monitor.   Hyponatremia: Persistent. ? Fluid overload. She appears to be compensated. She was on IV lasix last 24 hours.  Fluid restriction to 1 lit/day.  Urine osmlo and serum osmo ordered. Urine sodium ordered to evaluate for SIADH, tsh one week ago was normal. Am cortisol ordered.     Code Status: full code.  Family Communication: discussed with son over the phone.  Disposition Plan: pending.    Consultants:  none  Procedures:  none     HPI/Subjective: Comfortable, denies any pain, sitting in the chair.   Objective: Filed Vitals:   10/29/13 1609  BP: 131/65  Pulse:  55  Temp: 97.6 F (36.4 C)  Resp: 18    Intake/Output Summary (Last 24 hours) at 10/29/13 1745 Last data filed at 10/29/13 0910  Gross per 24 hour  Intake    120 ml  Output      0 ml  Net    120 ml   Filed Weights   10/27/13 1717 10/27/13 2138 10/29/13 1100  Weight: 50.803 kg (112 lb) 49.9 kg (110 lb 0.2 oz) 52.572 kg (115 lb 14.4 oz)    Exam:   General:  Alert afebrile comfortable  Cardiovascular: s1s2 normal,  Respiratory: chest clear to ausculatation, no wheezing heard  Abdomen: soft NT nd bs+  Musculoskeletal: no pedal edema.   Data Reviewed: Basic Metabolic Panel:  Recent Labs Lab 10/27/13 1606 10/28/13 0550 10/29/13 1200  NA 133* 127* 124*  K 4.2 3.9 4.8  CL 96 91* 89*  CO2 24 25 23   GLUCOSE 94 93 103*  BUN 85* 80* 82*  CREATININE 1.76* 1.80* 1.87*  CALCIUM 8.1* 7.9* 7.9*   Liver Function Tests: No results found for this basename: AST, ALT, ALKPHOS, BILITOT, PROT, ALBUMIN,  in the last 168 hours No results found for this basename: LIPASE, AMYLASE,  in the last 168 hours No results found for this basename: AMMONIA,  in the last 168 hours CBC:  Recent Labs Lab 10/27/13 1606 10/28/13 0550  WBC 2.8* 3.7*  NEUTROABS 2.0  --   HGB 10.3* 9.3*  HCT 31.4* 27.4*  MCV 92.1  89.0  PLT 72* 67*   Cardiac Enzymes: No results found for this basename: CKTOTAL, CKMB, CKMBINDEX, TROPONINI,  in the last 168 hours BNP (last 3 results)  Recent Labs  06/25/13 1129 10/15/13 1318 10/27/13 1606  PROBNP 27880.0* 24313.0* 16497.0*   CBG:  Recent Labs Lab 10/29/13 0758 10/29/13 1014 10/29/13 1151 10/29/13 1514 10/29/13 1616  GLUCAP 122* 188* 118* 124* 124*    Recent Results (from the past 240 hour(s))  CULTURE, BLOOD (ROUTINE X 2)     Status: None   Collection Time    10/27/13  4:40 PM      Result Value Ref Range Status   Specimen Description BLOOD RIGHT HAND   Final   Special Requests BOTTLES DRAWN AEROBIC AND ANAEROBIC 5 CC   Final   Culture   Setup Time     Final   Value: 10/27/2013 22:00     Performed at [facility]   Culture     Final   Value:        BLOOD CULTURE RECEIVED NO GROWTH TO DATE CULTURE WILL BE HELD FOR 5 DAYS BEFORE ISSUING A FINAL NEGATIVE REPORT     Performed at [facility]   Report Status PENDING   Incomplete  CULTURE, BLOOD (ROUTINE X 2)     Status: None   Collection Time    10/27/13  4:45 PM      Result Value Ref Range Status   Specimen Description BLOOD LEFT ARM   Final   Special Requests BOTTLES DRAWN AEROBIC ONLY 3 CC   Final   Culture  Setup Time     Final   Value: 10/27/2013 22:00     Performed at [facility]   Culture     Final   Value:        BLOOD CULTURE RECEIVED NO GROWTH TO DATE CULTURE WILL BE HELD FOR 5 DAYS BEFORE ISSUING A FINAL NEGATIVE REPORT     Performed at [facility]   Report Status PENDING   Incomplete  URINE CULTURE     Status: None   Collection Time    10/27/13  6:01 PM      Result Value Ref Range Status   Specimen Description URINE, CATHETERIZED   Final   Special Requests NONE   Final   Culture  Setup Time     Final   Value: 10/27/2013 19:01     Performed at [place]     Final   Value: NO GROWTH     Performed at [facility]   Culture     Final   Value: NO GROWTH     Performed at [facility]   Report Status 10/28/2013 FINAL   Final     Studies: No results found.  Scheduled Meds: . allopurinol  100 mg Oral Daily  . aspirin EC  81 mg Oral Daily  . atorvastatin  10 mg Oral q1800  . carvedilol  6.25 mg Oral BID WC  . colchicine  0.3 mg Oral Daily  . isosorbide-hydrALAZINE  2 tablet Oral TID  . potassium chloride SA  20 mEq Oral Daily  . sodium chloride  3 mL Intravenous Q12H  . spironolactone  25 mg Oral Daily  . [START ON 10/30/2013] torsemide  40 mg Oral BID   Continuous Infusions:    Principal Problem:   Hypoglycemia Active Problems:   Diabetes mellitus, type 2   Chronic  kidney disease (CKD), stage IV (severe)   Chronic systolic heart failure   Pancytopenia    Time spent: 25 minutes.     [facility] Pager [phone]. If 7PM-7AM, please contact night-coverage at [email or web address], password [facility] 10/29/2013, 5:45 PM  LOS: 2 days

## 2013-10-29 NOTE — Care Management Note (Unsigned)
    Page 1 of 1   10/29/2013     4:01:58 PM CARE MANAGEMENT NOTE 10/29/2013  Patient:  [name] [name], [name] [name]   Account Number:  [id number]  Date Initiated:  10/29/2013  Documentation initiated by:  Tuyet Bader  Subjective/Objective Assessment:   Pt adm on 9/20 with CHF, hypoglycemia.  PTA, pt resides at home with family and is independent.  She speaks very little [place].     Action/Plan:   Will follow for dc needs as pt progresses.   Anticipated DC Date:  10/30/2013   Anticipated DC Plan:  [facility]  CM consult      Choice offered to / List presented to:             Status of service:  In process, will continue to follow Medicare Important Message given?   (If response is "NO", the following Medicare IM given date fields will be blank) Date Medicare IM given:   Medicare IM given by:   Date Additional Medicare IM given:   Additional Medicare IM given by:    Discharge Disposition:    Per UR Regulation:  Reviewed for med. necessity/level of care/duration of stay  If discussed at [place] of Stay Meetings, dates discussed:    Comments:

## 2013-10-29 NOTE — Progress Notes (Signed)
Pharmacist Heart Failure Core Measure Documentation  Assessment: [name] [name] has an EF documented as 30-35% on 10/16/2013 by Dr. Sallyanne Kuster.  Rationale: Heart failure patients with left ventricular systolic dysfunction (LVSD) and an EF < 40% should be prescribed an angiotensin converting enzyme inhibitor (ACEI) or angiotensin receptor blocker (ARB) at discharge unless a contraindication is documented in the medical record.  This patient is not currently on an ACEI or ARB for HF.  This note is being placed in the record in order to provide documentation that a contraindication to the use of these agents is present for this encounter.  ACE Inhibitor or Angiotensin Receptor Blocker is contraindicated (specify all that apply)  []   ACEI allergy AND ARB allergy []   Angioedema []   Moderate or severe aortic stenosis []   Hyperkalemia []   Hypotension []   Renal artery stenosis [x]   Worsening renal function, preexisting renal disease or dysfunction (Noted Dr. Claris Gladden note from 10/21/2013 stated to hold off d/t CKD.)   [name] [name] 10/29/2013 2:15 PM

## 2013-10-30 ENCOUNTER — Inpatient Hospital Stay (HOSPITAL_COMMUNITY): Payer: Medicaid Other

## 2013-10-30 LAB — BASIC METABOLIC PANEL
ANION GAP: 12 (ref 5–15)
BUN: 88 mg/dL — ABNORMAL HIGH (ref 6–23)
CHLORIDE: 92 meq/L — AB (ref 96–112)
CO2: 23 mEq/L (ref 19–32)
Calcium: 8.3 mg/dL — ABNORMAL LOW (ref 8.4–10.5)
Creatinine, Ser: 1.86 mg/dL — ABNORMAL HIGH (ref 0.50–1.10)
GFR calc non Af Amer: 28 mL/min — ABNORMAL LOW (ref >90)
GFR, EST AFRICAN AMERICAN: 32 mL/min — AB (ref >90)
Glucose, Bld: 94 mg/dL (ref 70–99)
POTASSIUM: 4.7 meq/L (ref 3.7–5.3)
Sodium: 127 mEq/L — ABNORMAL LOW (ref 137–147)

## 2013-10-30 LAB — GLUCOSE, CAPILLARY
Glucose-Capillary: 102 mg/dL — ABNORMAL HIGH (ref 70–99)
Glucose-Capillary: 196 mg/dL — ABNORMAL HIGH (ref 70–99)

## 2013-10-30 LAB — CBC
HEMATOCRIT: 27 % — AB (ref 36.0–46.0)
Hemoglobin: 9 g/dL — ABNORMAL LOW (ref 12.0–15.0)
MCH: 30.4 pg (ref 26.0–34.0)
MCHC: 33.3 g/dL (ref 30.0–36.0)
MCV: 91.2 fL (ref 78.0–100.0)
Platelets: 58 10*3/uL — ABNORMAL LOW (ref 150–400)
RBC: 2.96 MIL/uL — ABNORMAL LOW (ref 3.87–5.11)
RDW: 16.8 % — AB (ref 11.5–15.5)
WBC: 3.3 10*3/uL — AB (ref 4.0–10.5)

## 2013-10-30 LAB — OSMOLALITY: Osmolality: 293 mOsm/kg (ref 275–300)

## 2013-10-30 LAB — PRO B NATRIURETIC PEPTIDE: Pro B Natriuretic peptide (BNP): 12300 pg/mL — ABNORMAL HIGH (ref 0–125)

## 2013-10-30 MED ORDER — GLUCERNA SHAKE PO LIQD: 237.0000 mL | Freq: Every day | ORAL | Status: DC

## 2013-10-30 NOTE — Progress Notes (Signed)
INITIAL NUTRITION ASSESSMENT  DOCUMENTATION CODES Per approved criteria  -Not Applicable   INTERVENTION: Glucerna Shake po daily, each supplement provides 220 kcal and 10 grams of protein RD to follow for nutrition care plan  NUTRITION DIAGNOSIS: Inadequate oral intake related to poor appetite as evidenced by son report  Goal: Pt to meet >/= 90% of their estimated nutrition needs   Monitor:  PO & supplemental intake, weight, labs, I/O's  Reason for Assessment: Malnutrition Screening Tool Report  63 y.o. female  Admitting Dx: Hypoglycemia  ASSESSMENT: 63 y.o. Female with h/o dM, on glucotrol at home was found altered by her son. Son called EMS and she was found to be hypoglycemic. She is a diabetic and is on glucotrol. She was admitted to medical service for further evaluation.   RD spoke with patient's son who reports patient's appetite has been decreased PTA; he feels this was related to DM medications she was taking; current PO intake is variable at 50-100% per flowsheet records; weight has been stable per readings below; son amenable to RD ordering oral nutrition supplement.  No muscle or subcutaneous fat depletion noticed.  Height: Ht Readings from Last 1 Encounters:  10/27/13 5' 4.5" (1.638 m)    Weight: Wt Readings from Last 1 Encounters:  10/29/13 115 lb 14.4 oz (52.572 kg)    Ideal Body Weight: 120 lb  % Ideal Body Weight: 95%  Wt Readings from Last 10 Encounters:  10/29/13 115 lb 14.4 oz (52.572 kg)  10/21/13 114 lb 13.8 oz (52.1 kg)  07/10/13 115 lb 8.3 oz (52.4 kg)  04/08/13 136 lb (61.689 kg)  04/02/13 124 lb (56.246 kg)  03/19/13 112 lb 10.5 oz (51.1 kg)  03/19/13 112 lb 10.5 oz (51.1 kg)  03/19/13 112 lb 10.5 oz (51.1 kg)  03/19/13 112 lb 10.5 oz (51.1 kg)  03/05/13 122 lb 9.6 oz (55.611 kg)    Usual Body Weight: 115 lb  % Usual Body Weight: 100%  BMI:  Body mass index is 19.59 kg/(m^2).  Estimated Nutritional Needs: Kcal:  1300-1500 Protein: 65-75 gm Fluid: >/= 1.5 L  Skin: Intact  Diet Order: Carb Control  EDUCATION NEEDS: -No education needs identified at this time  Labs:   Recent Labs Lab 10/28/13 0550 10/29/13 1200 10/30/13 0531  NA 127* 124* 127*  K 3.9 4.8 4.7  CL 91* 89* 92*  CO2 25 23 23   BUN 80* 82* 88*  CREATININE 1.80* 1.87* 1.86*  CALCIUM 7.9* 7.9* 8.3*  GLUCOSE 93 103* 94    CBG (last 3)   Recent Labs  10/29/13 1616 10/29/13 2102 10/30/13 0605  GLUCAP 124* 139* 102*    Scheduled Meds: . allopurinol  100 mg Oral Daily  . aspirin EC  81 mg Oral Daily  . atorvastatin  10 mg Oral q1800  . carvedilol  6.25 mg Oral BID WC  . colchicine  0.3 mg Oral Daily  . isosorbide-hydrALAZINE  2 tablet Oral TID  . potassium chloride SA  20 mEq Oral Daily  . sodium chloride  3 mL Intravenous Q12H  . spironolactone  25 mg Oral Daily  . torsemide  40 mg Oral BID    Continuous Infusions:   Past Medical History  Diagnosis Date  . Hypertension   . Pneumonia   . CHF (congestive heart failure) 08/2012  . Anemia 08/2012  . UTI (urinary tract infection) 08/22/2012  . Ischemic cardiomyopathy     EF 30-35%  . Coronary artery disease   . Pulmonary  hypertension   . NSVT (nonsustained ventricular tachycardia) 02/2013  . Diabetes mellitus     Reportedly diagnosed 2011 but no medication initiated until 04/2011 (Metformin)  . Diabetic peripheral neuropathy     Since 2013  . Thrombocytopenia   . CKD (chronic kidney disease)   . Chronic systolic CHF (congestive heart failure)     a. EF 35-40% grade II DD 02/2013. b. Previously required short-term milrinone.  Marland Kitchen Uncontrolled diabetes mellitus   . HTN (hypertension)   . Pulmonary HTN     a. Cath 02/2013: severe pulm HTN.  . CAD (coronary artery disease)     a. Cath 02/2013: severe 2V CAD - 100% mRCA with L-R collaterals; diffuse 80-95% subtotal OM2 (too diffusely diseased to attempt PCA), overall small caliber diffusely diseased coronaries c/w  poorly controlled DM; treated medically.  . CKD (chronic kidney disease), stage IV   . Ischemic cardiomyopathy   . Anemia     a. Prior normal EGD 03/2013. b. Progressive anemia 06/2013 in setting of transient hematemesis that had resolved.   . Thrombocytopenia     a. Liver imaging normal 03/2013.  . Edema     a. RUE edema with neg duplex for DVT 06/2013.  Marland Kitchen Protein calorie malnutrition   . Poor social situation   . Ascites     a. s/p paracentesis 03/2013.  . Valvular heart disease     a. Echo 06/2013: mild AI, mod MR, mod TR, mod PR.    Past Surgical History  Procedure Laterality Date  . Cesarean section    . Tubal ligation    . Esophagogastroduodenoscopy N/A 03/13/2013    Procedure: ESOPHAGOGASTRODUODENOSCOPY (EGD);  Surgeon: Winfield Cunas., MD;  Location: [facility] ENDOSCOPY;  Service: Endoscopy;  Laterality: N/A;  . Cardiac catheterization  02/2013    severe 2 vessel CAD    Arthur Holms, RD, LDN Pager #: [phone] After-Hours Pager #: [phone]

## 2013-10-30 NOTE — Progress Notes (Signed)
Physical Therapy Treatment Patient Details Name: Maxi Carreras MRN: [id number] DOB: 08/25/1950 Today's Date: 10/30/2013    History of Present Illness 63 year old readmitted with hypoglycemia from recent hospitlal stay with CHF.  Pt originaly from [place] and has been in [place] for 7 years.  PMH - diabetes and kidney disease.      PT Comments    Pt progressing with mobility, supervision level with RW. Worked on balance activities as well as ther ex. PT will continue to follow.    Follow Up Recommendations  No PT follow up     Equipment Recommendations  None recommended by PT    Recommendations for Other Services       Precautions / Restrictions Precautions Precautions: Fall Restrictions Weight Bearing Restrictions: No    Mobility  Bed Mobility Overal bed mobility: Modified Independent                Transfers Overall transfer level: Modified independent Equipment used: None Transfers: Sit to/from Stand              Ambulation/Gait Ambulation/Gait assistance: Supervision Ambulation Distance (Feet): 350 Feet Assistive device: Rolling walker (2 wheeled) Gait Pattern/deviations: Trunk flexed Gait velocity: decreased   General Gait Details: vc's for posture and staying close to RW. Pt with no LOB and steady, though decreased, pace.    Stairs            Wheelchair Mobility    Modified Rankin (Stroke Patients Only)       Balance Overall balance assessment: Needs assistance Sitting-balance support: No upper extremity supported;Feet supported Sitting balance-Leahy Scale: Good     Standing balance support: Single extremity supported;During functional activity Standing balance-Leahy Scale: Fair Standing balance comment: pt able to maintain static stance without UE support, requires surface to steady with dynamic activity Single Leg Stance - Right Leg: 10 Single Leg Stance - Left Leg: 10           High Level Balance Comments: worked on single  limb stance as well as standing ther ex    Cognition Arousal/Alertness: Awake/alert Behavior During Therapy: WFL for tasks assessed/performed Overall Cognitive Status: Within Functional Limits for tasks assessed                      Exercises Total Joint Exercises Standing Hip Extension: AROM;Both;10 reps;Standing General Exercises - Lower Extremity Ankle Circles/Pumps: AROM;Both;10 reps;Seated Long Arc Quad: AROM;Both;10 reps;Seated Heel Raises: AROM;Both;10 reps;Standing Mini-Sqauts: AROM;10 reps;Standing    General Comments        Pertinent Vitals/Pain Pain Assessment: No/denies pain HR 65 bpm with ambulation    Home Living                      Prior Function            PT Goals (current goals can now be found in the care plan section) Acute Rehab PT Goals Patient Stated Goal: return home PT Goal Formulation: With patient Time For Goal Achievement: 11/08/13 Potential to Achieve Goals: Good Progress towards PT goals: Progressing toward goals    Frequency  Min 3X/week    PT Plan Current plan remains appropriate    Co-evaluation             End of Session Equipment Utilized During Treatment: Gait belt Activity Tolerance: Patient tolerated treatment well Patient left: in chair;with call bell/phone within reach     Time: 1026-1044 PT Time Calculation (min): 18 min  Charges:  $  Gait Training: 8-22 mins                    G Codes:     Leighton Roach, PT  Acute Rehab Services  [phone]  Leighton Roach 10/30/2013, 10:51 AM

## 2013-10-30 NOTE — Discharge Summary (Signed)
Physician Discharge Summary  [name] [name] QPR:[id number] DOB: 12-16-1950 DOA: 10/27/2013  PCP: Angelica Chessman, MD  Admit date: 10/27/2013 Discharge date: 10/30/2013  Time spent: 45 minutes  Recommendations for Outpatient Follow-up:  1. Dr.Bensimhon 9/24 2. PCP in 1-2weeks, needs Hematology referral for Pancytopenia  Discharge Diagnoses:  Principal Problem:   Hypoglycemia Active Problems:   Diabetes mellitus, type 2   Chronic kidney disease (CKD), stage IV (severe)   Chronic systolic heart failure   Pancytopenia   Discharge Condition: stable  Diet recommendation: heart healthy, low sodium  Filed Weights   10/27/13 1717 10/27/13 2138 10/29/13 1100  Weight: 50.803 kg (112 lb) 49.9 kg (110 lb 0.2 oz) 52.572 kg (115 lb 14.4 oz)    History of present illness:  [name] [name] is a 63 y.o. female who wasn't feeling well on day of admission per son, but they weren't able to check CBG at home. Patient developed AMS and was brought in to the ED by son. CBG was checked as part of the basic AMS work up and was found to be 21 in triage! Patient was also hypothermic. After D50 BGL came up to the 200s and with a bear hugger her temp has normalized, she is now back to baseline.  She takes glipizide 2.5mg  daily, took this morning of admission  Hospital Course:  Hypoglycemia -related to glipizide and unreliable PO intake. Glipizide Dced Required dextrose drip on admission, since then stopped after 12hours and no more episodes of hypoglycemia off the dextrose drip.   DM2 - holding glipizide,  hgba1c ordered and is 6.2. Control with diet alone  CKD stage 4 - chronic, due to cardio-renal syndrome   Chronic systolic CHF - chronic and compensated, BNP up but lower than recent baselines -continue home regimen of diuretics, FU with CHF service 9/24   Pancytopenia - chronic and essentially unchanged since May of this year,  Needs heme referral for workup as outpatient    Discharge Exam: Filed  Vitals:   10/30/13 1014  BP: 148/59  Pulse: 64  Temp:   Resp:     General: AAOx3 Cardiovascular: S1S2/RRR Respiratory: CTAB  Discharge Instructions You were cared for by a hospitalist during your hospital stay. If you have any questions about your discharge medications or the care you received while you were in the hospital after you are discharged, you can call the unit and asked to speak with the hospitalist on call if the hospitalist that took care of you is not available. Once you are discharged, your primary care physician will handle any further medical issues. Please note that NO REFILLS for any discharge medications will be authorized once you are discharged, as it is imperative that you return to your primary care physician (or establish a relationship with a primary care physician if you do not have one) for your aftercare needs so that they can reassess your need for medications and monitor your lab values.  Discharge Instructions   Diet - low sodium heart healthy    Complete by:  As directed      Diet Carb Modified    Complete by:  As directed      Increase activity slowly    Complete by:  As directed           Current Discharge Medication List    CONTINUE these medications which have NOT CHANGED   Details  allopurinol (ZYLOPRIM) 100 MG tablet Take 1 tablet (100 mg total) by mouth daily. Qty: 30 tablet, Refills:  0    aspirin 81 MG EC tablet Take 1 tablet (81 mg total) by mouth daily. Qty: 90 tablet, Refills: 3   Associated Diagnoses: Chronic systolic congestive heart failure    atorvastatin (LIPITOR) 10 MG tablet Take 1 tablet (10 mg total) by mouth daily at 6 PM. Qty: 30 tablet, Refills: 0    carvedilol (COREG) 6.25 MG tablet Take 1 tablet (6.25 mg total) by mouth 2 (two) times daily. Qty: 60 tablet, Refills: 0    colchicine 0.6 MG tablet Take 0.5 tablets (0.3 mg total) by mouth daily. Qty: 15 tablet, Refills: 0    isosorbide-hydrALAZINE (BIDIL) 20-37.5 MG  per tablet Take 2 tablets by mouth 3 (three) times daily. Qty: 90 tablet, Refills: 0    potassium chloride SA (K-DUR,KLOR-CON) 20 MEQ tablet Take 20 mEq by mouth daily.    spironolactone (ALDACTONE) 25 MG tablet Take 1 tablet (25 mg total) by mouth daily. Qty: 30 tablet, Refills: 0    torsemide (DEMADEX) 20 MG tablet Take 2 tablets (40 mg total) by mouth 2 (two) times daily. Qty: 30 tablet, Refills: 0      STOP taking these medications     glipiZIDE (GLUCOTROL XL) 2.5 MG 24 hr tablet        No Known Allergies Follow-up Information   Follow up with Glori Bickers, MD On 10/31/2013. (at 11:40 am in the [facility]--code [id number])    Specialty:  Cardiology   Contact information:   [place] [address] [phone]       Follow up with Angelica Chessman, MD. Schedule an appointment as soon as possible for a visit in 1 week.   Specialty:  Internal Medicine   Contact information:   [place] [address] [phone]        The results of significant diagnostics from this hospitalization (including imaging, microbiology, ancillary and laboratory) are listed below for reference.    Significant Diagnostic Studies: Dg Chest 2 View  10/30/2013   CLINICAL DATA:  Difficulty breathing  EXAM: CHEST  2 VIEW  COMPARISON:  October 27, 2013  FINDINGS: Currently there is no appreciable edema or consolidation. There is a small granuloma in the left upper lobe. Heart is enlarged with mild pulmonary venous hypertension. No adenopathy. There is atherosclerotic change in aorta. No bone lesions.  IMPRESSION: Persistent volume overload without frank edema or consolidation. Small granuloma left upper lobe.   Electronically Signed   By: Lowella Grip M.D.   On: 10/30/2013 08:09   Dg Wrist Complete Left  10/17/2013   CLINICAL DATA:  Pain and swelling.  Possible injury  EXAM: LEFT WRIST - COMPLETE 3+ VIEW  COMPARISON:  None.   FINDINGS: There is no evidence of fracture or dislocation. There is no evidence of arthropathy or other focal bone abnormality. Soft tissues are unremarkable.  IMPRESSION: Negative.   Electronically Signed   By: Franchot Gallo M.D.   On: 10/17/2013 08:05   Dg Abd 1 View  10/21/2013   CLINICAL DATA:  Abdominal distention.  EXAM: ABDOMEN - 1 VIEW  COMPARISON:  Abdominal radiograph performed 10/15/2013  FINDINGS: The visualized bowel gas pattern is unremarkable. Scattered air and stool filled loops of colon are seen; no abnormal dilatation of small bowel loops is seen to suggest small bowel obstruction. No free intra-abdominal air is identified, though evaluation for free air is limited on a single supine view. A small amount of air is noted within  the stomach.  The visualized osseous structures are within normal limits; the sacroiliac joints are unremarkable in appearance.  IMPRESSION: Unremarkable bowel gas pattern; no free intra-abdominal air seen. Moderate amount of stool noted in the colon.   Electronically Signed   By: Garald Balding M.D.   On: 10/21/2013 01:46   Ct Head Wo Contrast  10/15/2013   CLINICAL DATA:  Respiratory arrest, altered mental status  EXAM: CT HEAD WITHOUT CONTRAST  TECHNIQUE: Contiguous axial images were obtained from the base of the skull through the vertex without intravenous contrast.  COMPARISON:  None.  FINDINGS: There is no evidence of mass effect, midline shift, or extra-axial fluid collections. There is no evidence of a space-occupying lesion or intracranial hemorrhage. There is no evidence of a cortical-based area of acute infarction. There is generalized cerebral atrophy. There is periventricular white matter low attenuation likely secondary to microangiopathy.  The ventricles and sulci are appropriate for the patient's age. The basal cisterns are patent.  Visualized portions of the orbits are unremarkable. There is a small amount of fluid in the ethmoid sinuses posteriorly.  There is a small amount of fluid in the sphenoid sinus. There is high-density fluid in the posterior nasal passage concerning for blood products. Cerebrovascular atherosclerotic calcifications are noted.  The osseous structures are unremarkable.  IMPRESSION: 1. No acute intracranial pathology. 2. There is high-density fluid in the posterior nasal passage concerning for blood products.   Electronically Signed   By: Kathreen Devoid   On: 10/15/2013 15:02   Dg Chest Portable 1 View  10/27/2013   CLINICAL DATA:  Short of breath.  EXAM: PORTABLE CHEST - 1 VIEW  COMPARISON:  06/27/2013.  FINDINGS: Mild central vascular congestion. Mild enlargement cardiac silhouette. Hazy opacity is noted at the lung bases consistent with small effusions. No overt pulmonary edema.  No mediastinal or hilar masses.  No pneumothorax.  Bony thorax is demineralized but grossly intact.  IMPRESSION: Mild congestive heart failure is suspected with central vascular congestion, mild cardiomegaly and small bilateral effusions.   Electronically Signed   By: Lajean Manes M.D.   On: 10/27/2013 16:42   Dg Chest Port 1 View  10/17/2013   CLINICAL DATA:  Followup respiratory failure  EXAM: PORTABLE CHEST - 1 VIEW  COMPARISON:  Prior chest x-ray 10/16/2013  FINDINGS: Extensive metallic artifact projects over the chest in the formal of the numerous coiled cardiac leads. Stable enlargement of the cardiopericardial silhouette. Atherosclerotic calcifications noted in the transverse aorta. The patient has been extubated and both the left IJ central venous catheter and nasogastric tube have been removed. Inspiratory volumes are slightly lower and there is slightly increased left basilar atelectasis. Pulmonary vascular congestion has improved. No pneumothorax. Probable small bilateral layering pleural effusions. No acute osseous abnormality.  IMPRESSION: 1. Interval extubation, removal of nasogastric tube and removal of left IJ central venous catheter. 2.  Interval resolution of pulmonary vascular congestion. 3. Slightly increased left basilar atelectasis. 4. Trace bilateral pleural effusions.   Electronically Signed   By: Jacqulynn Cadet M.D.   On: 10/17/2013 07:45   Dg Chest Port 1 View  10/16/2013   CLINICAL DATA:  Airspace disease.  EXAM: PORTABLE CHEST - 1 VIEW  COMPARISON:  10/15/2013.  FINDINGS: Endotracheal tube 1.2 cm above the carina. Proximal repositioning should be considered. Left IJ line in good anatomic position. NG tube in good anatomic position. Stable cardiomegaly with normal pulmonary vascularity. Bilateral airspace disease again noted. Small pleural effusions noted. Airspace disease  could be secondary to pulmonary edema, ARDS, and/or pneumonia. No acute bony abnormality.  IMPRESSION: 1. Endotracheal tube tip 1.2 cm above the carina. 2. Left IJ line and NG tube in stable position. 3. Persistent bilateral airspace disease. This could be secondary to pulmonary edema, ARDS, and/or bilateral pneumonia. Associated small bilateral pleural effusions noted. 4. Cardiomegaly.   Electronically Signed   By: [place]   On: 10/16/2013 07:49   Dg Chest Port 1 View  10/15/2013   CLINICAL DATA:  Post central line placement. Evaluate for pneumothorax.  EXAM: PORTABLE CHEST - 1 VIEW  COMPARISON:  Earlier same day  FINDINGS: Grossly unchanged enlarged cardiac silhouette and mediastinal contours with atherosclerotic plaque within the thoracic aorta. Interval placement of a left jugular approach intravenous catheter with tip projected over the superior cavoatrial junction. Otherwise, stable position of remaining support apparatus. No pneumothorax. The pulmonary vasculature remains indistinct with cephalization of flow. Interval increase in size of small bilateral effusions with associated worsening bibasilar opacities, left greater than right. Unchanged bones.  IMPRESSION: 1. Interval placement of left jugular approach intravenous catheter with tip  projected of the superior cavoatrial junction. Otherwise, stable position of remaining support apparatus. No pneumothorax. 2. Grossly unchanged findings of pulmonary edema with minimal increase in size of bilateral effusions and associated worsening bibasilar opacities, left greater than right. 3. Cardiomegaly and atherosclerosis.   Electronically Signed   By: Sandi Mariscal M.D.   On: 10/15/2013 18:59   Dg Chest Portable 1 View  10/15/2013   CLINICAL DATA:  Vaginal bleeding.  Unresponsive.  EXAM: PORTABLE CHEST - 1 VIEW  COMPARISON:  None.  FINDINGS: Endotracheal tube tip lies 2.4 cm above the chronic. Nasogastric tube passes below the diaphragm well into the stomach.  Cardiac silhouette is mildly enlarged. No mediastinal or hilar masses. There are prominent bronchovascular markings. No lung consolidation or edema. No pleural effusion or gross pneumothorax on this supine study.  Bony thorax is demineralized but intact.  IMPRESSION: 1. Endotracheal tube and nasogastric tube are well positioned. 2. No acute cardiopulmonary disease.   Electronically Signed   By: Lajean Manes M.D.   On: 10/15/2013 13:47   Dg Abd Portable 1v  10/15/2013   CLINICAL DATA:  NG tube placement  EXAM: PORTABLE ABDOMEN - 1 VIEW  COMPARISON:  None.  FINDINGS: Enteric tube tip and side port projects over the expected location of the gastric antrum.  There is a paucity of bowel gas without definite evidence of obstruction.  Nondiagnostic evaluation for pneumoperitoneum secondary supine positioning exclusion the lower thorax. No definite pneumatosis or portal venous gas.  Limited visualization of lower thorax suggests small left-sided effusion with associated left basilar opacities. There is exclusion of the caudal aspect of the right hemi thorax.  No acute osseus abnormalities. A catheter overlies the urinary bladder.  IMPRESSION: Enteric tube tip and side port projects over the gastric fundus.   Electronically Signed   By: Sandi Mariscal M.D.    On: 10/15/2013 19:06   Dg Hand Complete Left  10/17/2013   CLINICAL DATA:  Pain and swelling.  Question injury  EXAM: LEFT HAND - COMPLETE 3+ VIEW  COMPARISON:  None.  FINDINGS: There is no evidence of fracture or dislocation. There is no evidence of arthropathy or other focal bone abnormality. Soft tissues are unremarkable.  IMPRESSION: Negative.   Electronically Signed   By: Franchot Gallo M.D.   On: 10/17/2013 08:04    Microbiology: Recent Results (from the past 240  hour(s))  CULTURE, BLOOD (ROUTINE X 2)     Status: None   Collection Time    10/27/13  4:40 PM      Result Value Ref Range Status   Specimen Description BLOOD RIGHT HAND   Final   Special Requests BOTTLES DRAWN AEROBIC AND ANAEROBIC 5 CC   Final   Culture  Setup Time     Final   Value: 10/27/2013 22:00     Performed at [facility]   Culture     Final   Value:        BLOOD CULTURE RECEIVED NO GROWTH TO DATE CULTURE WILL BE HELD FOR 5 DAYS BEFORE ISSUING A FINAL NEGATIVE REPORT     Performed at [facility]   Report Status PENDING   Incomplete  CULTURE, BLOOD (ROUTINE X 2)     Status: None   Collection Time    10/27/13  4:45 PM      Result Value Ref Range Status   Specimen Description BLOOD LEFT ARM   Final   Special Requests BOTTLES DRAWN AEROBIC ONLY 3 CC   Final   Culture  Setup Time     Final   Value: 10/27/2013 22:00     Performed at [facility]   Culture     Final   Value:        BLOOD CULTURE RECEIVED NO GROWTH TO DATE CULTURE WILL BE HELD FOR 5 DAYS BEFORE ISSUING A FINAL NEGATIVE REPORT     Performed at [facility]   Report Status PENDING   Incomplete  URINE CULTURE     Status: None   Collection Time    10/27/13  6:01 PM      Result Value Ref Range Status   Specimen Description URINE, CATHETERIZED   Final   Special Requests NONE   Final   Culture  Setup Time     Final   Value: 10/27/2013 19:01     Performed at [place]     Final   Value: NO  GROWTH     Performed at [facility]   Culture     Final   Value: NO GROWTH     Performed at [facility]   Report Status 10/28/2013 FINAL   Final     Labs: Basic Metabolic Panel:  Recent Labs Lab 10/27/13 1606 10/28/13 0550 10/29/13 1200 10/30/13 0531  NA 133* 127* 124* 127*  K 4.2 3.9 4.8 4.7  CL 96 91* 89* 92*  CO2 24 25 23 23   GLUCOSE 94 93 103* 94  BUN 85* 80* 82* 88*  CREATININE 1.76* 1.80* 1.87* 1.86*  CALCIUM 8.1* 7.9* 7.9* 8.3*   Liver Function Tests: No results found for this basename: AST, ALT, ALKPHOS, BILITOT, PROT, ALBUMIN,  in the last 168 hours No results found for this basename: LIPASE, AMYLASE,  in the last 168 hours No results found for this basename: AMMONIA,  in the last 168 hours CBC:  Recent Labs Lab 10/27/13 1606 10/28/13 0550 10/30/13 0531  WBC 2.8* 3.7* 3.3*  NEUTROABS 2.0  --   --   HGB 10.3* 9.3* 9.0*  HCT 31.4* 27.4* 27.0*  MCV 92.1 89.0 91.2  PLT 72* 67* 58*   Cardiac Enzymes: No results found for this basename: CKTOTAL, CKMB, CKMBINDEX, TROPONINI,  in the last 168 hours BNP: BNP (last 3 results)  Recent Labs  10/15/13 1318 10/27/13 1606 10/30/13 0531  PROBNP 24313.0* 16497.0* 12300.0*   CBG:  Recent Labs Lab 10/29/13 1151 10/29/13 1514 10/29/13 1616 10/29/13 2102 10/30/13 0605  GLUCAP 118* 124* 124* 139* 102*       Signed:  Millard Bautch  [facility] 10/30/2013, 10:57 AM

## 2013-10-31 ENCOUNTER — Encounter: Payer: Self-pay | Admitting: Internal Medicine

## 2013-10-31 ENCOUNTER — Ambulatory Visit: Payer: Medicaid Other | Attending: Internal Medicine | Admitting: Internal Medicine

## 2013-10-31 VITALS — BP 123/63 | HR 59 | Temp 97.9°F | Resp 15 | Wt 115.0 lb

## 2013-10-31 DIAGNOSIS — I129 Hypertensive chronic kidney disease with stage 1 through stage 4 chronic kidney disease, or unspecified chronic kidney disease: Secondary | ICD-10-CM | POA: Insufficient documentation

## 2013-10-31 DIAGNOSIS — N189 Chronic kidney disease, unspecified: Secondary | ICD-10-CM | POA: Insufficient documentation

## 2013-10-31 DIAGNOSIS — Z7982 Long term (current) use of aspirin: Secondary | ICD-10-CM | POA: Diagnosis not present

## 2013-10-31 DIAGNOSIS — E119 Type 2 diabetes mellitus without complications: Secondary | ICD-10-CM | POA: Insufficient documentation

## 2013-10-31 DIAGNOSIS — I509 Heart failure, unspecified: Secondary | ICD-10-CM | POA: Insufficient documentation

## 2013-10-31 DIAGNOSIS — Z23 Encounter for immunization: Secondary | ICD-10-CM | POA: Diagnosis not present

## 2013-10-31 DIAGNOSIS — E139 Other specified diabetes mellitus without complications: Secondary | ICD-10-CM

## 2013-10-31 DIAGNOSIS — E162 Hypoglycemia, unspecified: Secondary | ICD-10-CM

## 2013-10-31 DIAGNOSIS — I2510 Atherosclerotic heart disease of native coronary artery without angina pectoris: Secondary | ICD-10-CM | POA: Insufficient documentation

## 2013-10-31 DIAGNOSIS — I5022 Chronic systolic (congestive) heart failure: Secondary | ICD-10-CM

## 2013-10-31 DIAGNOSIS — D61818 Other pancytopenia: Secondary | ICD-10-CM

## 2013-10-31 DIAGNOSIS — E089 Diabetes mellitus due to underlying condition without complications: Secondary | ICD-10-CM

## 2013-10-31 LAB — GLUCOSE, POCT (MANUAL RESULT ENTRY): POC Glucose: 191 mg/dl — AB (ref 70–99)

## 2013-10-31 NOTE — Progress Notes (Signed)
Patient here for follow up from the hospital Was admitted for hypoglycemia

## 2013-10-31 NOTE — Patient Instructions (Signed)
Diabetes Mellitus and Food It is important for you to manage your blood sugar (glucose) level. Your blood glucose level can be greatly affected by what you eat. Eating healthier foods in the appropriate amounts throughout the day at about the same time each day will help you control your blood glucose level. It can also help slow or prevent worsening of your diabetes mellitus. Healthy eating may even help you improve the level of your blood pressure and reach or maintain a healthy weight.  HOW CAN FOOD AFFECT ME? Carbohydrates Carbohydrates affect your blood glucose level more than any other type of food. Your dietitian will help you determine how many carbohydrates to eat at each meal and teach you how to count carbohydrates. Counting carbohydrates is important to keep your blood glucose at a healthy level, especially if you are using insulin or taking certain medicines for diabetes mellitus. Alcohol Alcohol can cause sudden decreases in blood glucose (hypoglycemia), especially if you use insulin or take certain medicines for diabetes mellitus. Hypoglycemia can be a life-threatening condition. Symptoms of hypoglycemia (sleepiness, dizziness, and disorientation) are similar to symptoms of having too much alcohol.  If your health care provider has given you approval to drink alcohol, do so in moderation and use the following guidelines:  Women should not have more than one drink per day, and men should not have more than two drinks per day. One drink is equal to:  12 oz of beer.  5 oz of wine.  1 oz of hard liquor.  Do not drink on an empty stomach.  Keep yourself hydrated. Have water, diet soda, or unsweetened iced tea.  Regular soda, juice, and other mixers might contain a lot of carbohydrates and should be counted. WHAT FOODS ARE NOT RECOMMENDED? As you make food choices, it is important to remember that all foods are not the same. Some foods have fewer nutrients per serving than other  foods, even though they might have the same number of calories or carbohydrates. It is difficult to get your body what it needs when you eat foods with fewer nutrients. Examples of foods that you should avoid that are high in calories and carbohydrates but low in nutrients include:  Trans fats (most processed foods list trans fats on the Nutrition Facts label).  Regular soda.  Juice.  Candy.  Sweets, such as cake, pie, doughnuts, and cookies.  Fried foods. WHAT FOODS CAN I EAT? Have nutrient-rich foods, which will nourish your body and keep you healthy. The food you should eat also will depend on several factors, including:  The calories you need.  The medicines you take.  Your weight.  Your blood glucose level.  Your blood pressure level.  Your cholesterol level. You also should eat a variety of foods, including:  Protein, such as meat, poultry, fish, tofu, nuts, and seeds (lean animal proteins are best).  Fruits.  Vegetables.  Dairy products, such as milk, cheese, and yogurt (low fat is best).  Breads, grains, pasta, cereal, rice, and beans.  Fats such as olive oil, trans fat-free margarine, canola oil, avocado, and olives. DOES EVERYONE WITH DIABETES MELLITUS HAVE THE SAME MEAL PLAN? Because every person with diabetes mellitus is different, there is not one meal plan that works for everyone. It is very important that you meet with a dietitian who will help you create a meal plan that is just right for you. Document Released: 10/21/2004 Document Revised: 01/29/2013 Document Reviewed: 12/21/2012 [employer] Patient Information 2015 [employer]. This   information is not intended to replace advice given to you by your health care provider. Make sure you discuss any questions you have with your health care provider.  

## 2013-10-31 NOTE — Progress Notes (Signed)
MRN: [id number] Name: [name] [name]  Sex: female Age: 63 y.o. DOB: 10/10/1950  Allergies: Review of patient's allergies indicates no known allergies.  Chief Complaint  Patient presents with  . Follow-up    HPI: Patient is 63 y.o. female who has history of diabetes CKD. CHF, pancytopenia patient was recently hospitalized with symptoms of altered mental state, EMR reviewed her patient found to be hypoglycemic her blood sugar was 21 mg/dL initially she was treated with D50, patient used to be on glipizide which was discontinued her her last A1c was 6.2% she was advised for diet control, history of chronic CHF she was continued with her medications and already has scheduled follow with cardiology, also noticed to have pancytopenia, patient was advised  to follow with hematology. Patient denies any acute symptoms denies any shortness of breath, patient has already eaten today her blood sugar is elevated.  Past Medical History  Diagnosis Date  . Hypertension   . Pneumonia   . CHF (congestive heart failure) 08/2012  . Anemia 08/2012  . UTI (urinary tract infection) 08/22/2012  . Ischemic cardiomyopathy     EF 30-35%  . Coronary artery disease   . Pulmonary hypertension   . NSVT (nonsustained ventricular tachycardia) 02/2013  . Diabetes mellitus     Reportedly diagnosed 2011 but no medication initiated until 04/2011 (Metformin)  . Diabetic peripheral neuropathy     Since 2013  . Thrombocytopenia   . CKD (chronic kidney disease)   . Chronic systolic CHF (congestive heart failure)     a. EF 35-40% grade II DD 02/2013. b. Previously required short-term milrinone.  Marland Kitchen Uncontrolled diabetes mellitus   . HTN (hypertension)   . Pulmonary HTN     a. Cath 02/2013: severe pulm HTN.  . CAD (coronary artery disease)     a. Cath 02/2013: severe 2V CAD - 100% mRCA with L-R collaterals; diffuse 80-95% subtotal OM2 (too diffusely diseased to attempt PCA), overall small caliber diffusely diseased coronaries  c/w poorly controlled DM; treated medically.  . CKD (chronic kidney disease), stage IV   . Ischemic cardiomyopathy   . Anemia     a. Prior normal EGD 03/2013. b. Progressive anemia 06/2013 in setting of transient hematemesis that had resolved.   . Thrombocytopenia     a. Liver imaging normal 03/2013.  . Edema     a. RUE edema with neg duplex for DVT 06/2013.  Marland Kitchen Protein calorie malnutrition   . Poor social situation   . Ascites     a. s/p paracentesis 03/2013.  . Valvular heart disease     a. Echo 06/2013: mild AI, mod MR, mod TR, mod PR.    Past Surgical History  Procedure Laterality Date  . Cesarean section    . Tubal ligation    . Esophagogastroduodenoscopy N/A 03/13/2013    Procedure: ESOPHAGOGASTRODUODENOSCOPY (EGD);  Surgeon: Winfield Cunas., MD;  Location: [facility] ENDOSCOPY;  Service: Endoscopy;  Laterality: N/A;  . Cardiac catheterization  02/2013    severe 2 vessel CAD      Medication List       This list is accurate as of: 10/31/13  5:16 PM.  Always use your most recent med list.               allopurinol 100 MG tablet  Commonly known as:  ZYLOPRIM  Take 1 tablet (100 mg total) by mouth daily.     aspirin 81 MG EC tablet  Take 1 tablet (81  mg total) by mouth daily.     atorvastatin 10 MG tablet  Commonly known as:  LIPITOR  Take 1 tablet (10 mg total) by mouth daily at 6 PM.     carvedilol 6.25 MG tablet  Commonly known as:  COREG  Take 1 tablet (6.25 mg total) by mouth 2 (two) times daily.     colchicine 0.6 MG tablet  Take 0.5 tablets (0.3 mg total) by mouth daily.     isosorbide-hydrALAZINE 20-37.5 MG per tablet  Commonly known as:  BIDIL  Take 2 tablets by mouth 3 (three) times daily.     potassium chloride SA 20 MEQ tablet  Commonly known as:  K-DUR,KLOR-CON  Take 20 mEq by mouth daily.     spironolactone 25 MG tablet  Commonly known as:  ALDACTONE  Take 1 tablet (25 mg total) by mouth daily.     torsemide 20 MG tablet  Commonly known as:   DEMADEX  Take 2 tablets (40 mg total) by mouth 2 (two) times daily.        No orders of the defined types were placed in this encounter.    Immunization History  Administered Date(s) Administered  . Influenza,inj,Quad PF,36+ Mos 03/07/2013, 10/17/2013, 10/31/2013  . Pneumococcal Polysaccharide-23 10/17/2013    Family History  Problem Relation Age of Onset  . Diabetes Brother     deceased in 16X dt DM complications    History  Substance Use Topics  . Smoking status: Never Smoker   . Smokeless tobacco: Not on file  . Alcohol Use: No    Review of Systems   As noted in HPI  Filed Vitals:   10/31/13 1639  BP: 123/63  Pulse: 59  Temp: 97.9 F (36.6 C)  Resp: 15    Physical Exam  Physical Exam  Constitutional: No distress.  Eyes: EOM are normal. Pupils are equal, round, and reactive to light.  Cardiovascular: Normal rate and regular rhythm.   Pulmonary/Chest: No respiratory distress. She has no wheezes. She has no rales.  Musculoskeletal:  Trace pedal edema    CBC    Component Value Date/Time   WBC 3.3* 10/30/2013 0531   WBC 5.2 03/05/2013 1916   RBC 2.96* 10/30/2013 0531   RBC 3.95* 03/05/2013 1916   RBC 2.90* 08/22/2012 0907   HGB 9.0* 10/30/2013 0531   HGB 11.3* 03/05/2013 1916   HCT 27.0* 10/30/2013 0531   HCT 37.5* 03/05/2013 1916   PLT 58* 10/30/2013 0531   MCV 91.2 10/30/2013 0531   MCV 95.0 03/05/2013 1916   LYMPHSABS 0.4* 10/27/2013 1606   MONOABS 0.3 10/27/2013 1606   EOSABS 0.1 10/27/2013 1606   BASOSABS 0.0 10/27/2013 1606    CMP     Component Value Date/Time   NA 127* 10/30/2013 0531   K 4.7 10/30/2013 0531   CL 92* 10/30/2013 0531   CO2 23 10/30/2013 0531   GLUCOSE 94 10/30/2013 0531   BUN 88* 10/30/2013 0531   CREATININE 1.86* 10/30/2013 0531   CREATININE 1.57* 01/27/2013 1315   CALCIUM 8.3* 10/30/2013 0531   PROT 7.2 06/26/2013 0632   ALBUMIN 2.3* 06/26/2013 0632   AST 21 06/26/2013 0632   ALT 9 06/26/2013 0632   ALKPHOS 135* 06/26/2013 0632    BILITOT 0.8 06/26/2013 0632   GFRNONAA 28* 10/30/2013 0531   GFRAA 32* 10/30/2013 0531    No results found for this basename: chol,  tri,  ldl    No components found with this basename: hga1c  Lab Results  Component Value Date/Time   AST 21 06/26/2013  6:32 AM    Assessment and Plan  Need for prophylactic vaccination and inoculation against influenza Flu shot given today.  Pancytopenia - Plan: Ambulatory referral to Hematology  Diabetes/Hypoglycemia - Plan:  Results for orders placed in visit on 10/31/13  GLUCOSE, POCT (MANUAL RESULT ENTRY)      Result Value Ref Range   POC Glucose 191 (*) 70 - 99 mg/dl   Her last A1c is 6.2%, patient is advised for diabetes meal planning, due to hypoglycemia will hold off oral hypoglycemics, advise patient and family to keep the fingerstick log, if her fasting sugar is persistently more than 150 mg/dL will reconsider starting her on low-dose glipizide, recheck A1c in 3 months.  Chronic CHF Patient is scheduled to follow with cardiology. Continue with meds.  Health Maintenance Flu shot given today.  Return in about 3 months (around 01/30/2014), or if symptoms worsen or fail to improve, for diabetes, hypertension.  Lorayne Marek, MD

## 2013-11-02 LAB — CULTURE, BLOOD (ROUTINE X 2)
CULTURE: NO GROWTH
Culture: NO GROWTH

## 2013-11-05 ENCOUNTER — Encounter (HOSPITAL_COMMUNITY): Payer: Self-pay

## 2013-11-05 ENCOUNTER — Telehealth: Payer: Self-pay | Admitting: Hematology and Oncology

## 2013-11-05 NOTE — Telephone Encounter (Signed)
S/W PATIENT SON AND GAVE NP APPT FOR 10/06 @ 10 W/DR. [place].

## 2013-11-07 ENCOUNTER — Encounter (HOSPITAL_COMMUNITY): Payer: Self-pay

## 2013-11-11 ENCOUNTER — Inpatient Hospital Stay (HOSPITAL_COMMUNITY): Admission: RE | Admit: 2013-11-11 | Payer: Self-pay | Source: Ambulatory Visit

## 2013-11-12 ENCOUNTER — Ambulatory Visit: Payer: Self-pay | Admitting: Hematology and Oncology

## 2013-11-12 ENCOUNTER — Encounter: Payer: Self-pay | Admitting: Hematology and Oncology

## 2013-11-12 ENCOUNTER — Ambulatory Visit: Payer: Self-pay

## 2013-11-12 ENCOUNTER — Ambulatory Visit (HOSPITAL_BASED_OUTPATIENT_CLINIC_OR_DEPARTMENT_OTHER): Payer: Medicaid Other | Admitting: Hematology and Oncology

## 2013-11-12 ENCOUNTER — Ambulatory Visit (HOSPITAL_BASED_OUTPATIENT_CLINIC_OR_DEPARTMENT_OTHER): Payer: Medicaid Other

## 2013-11-12 ENCOUNTER — Ambulatory Visit: Payer: Medicaid Other

## 2013-11-12 VITALS — BP 146/59 | HR 56 | Temp 98.1°F | Resp 18 | Ht 64.0 in | Wt 107.6 lb

## 2013-11-12 DIAGNOSIS — N184 Chronic kidney disease, stage 4 (severe): Secondary | ICD-10-CM

## 2013-11-12 DIAGNOSIS — D61818 Other pancytopenia: Secondary | ICD-10-CM

## 2013-11-12 DIAGNOSIS — I5022 Chronic systolic (congestive) heart failure: Secondary | ICD-10-CM

## 2013-11-12 LAB — CBC & DIFF AND RETIC
BASO%: 1.1 % (ref 0.0–2.0)
Basophils Absolute: 0 10*3/uL (ref 0.0–0.1)
EOS%: 26.2 % — ABNORMAL HIGH (ref 0.0–7.0)
Eosinophils Absolute: 0.7 10*3/uL — ABNORMAL HIGH (ref 0.0–0.5)
HCT: 27.1 % — ABNORMAL LOW (ref 34.8–46.6)
HGB: 8.9 g/dL — ABNORMAL LOW (ref 11.6–15.9)
IMMATURE RETIC FRACT: 1.9 % (ref 1.60–10.00)
LYMPH#: 0.7 10*3/uL — AB (ref 0.9–3.3)
LYMPH%: 26.9 % (ref 14.0–49.7)
MCH: 30.6 pg (ref 25.1–34.0)
MCHC: 32.8 g/dL (ref 31.5–36.0)
MCV: 93.1 fL (ref 79.5–101.0)
MONO#: 0.3 10*3/uL (ref 0.1–0.9)
MONO%: 10.5 % (ref 0.0–14.0)
NEUT#: 1 10*3/uL — ABNORMAL LOW (ref 1.5–6.5)
NEUT%: 35.3 % — ABNORMAL LOW (ref 38.4–76.8)
Platelets: 47 10*3/uL — ABNORMAL LOW (ref 145–400)
RBC: 2.91 10*6/uL — ABNORMAL LOW (ref 3.70–5.45)
RDW: 19.4 % — AB (ref 11.2–14.5)
Retic %: 1.83 % (ref 0.70–2.10)
Retic Ct Abs: 53.25 10*3/uL (ref 33.70–90.70)
WBC: 2.8 10*3/uL — ABNORMAL LOW (ref 3.9–10.3)

## 2013-11-12 LAB — COMPREHENSIVE METABOLIC PANEL (CC13)
ALBUMIN: 2.9 g/dL — AB (ref 3.5–5.0)
ALT: 22 U/L (ref 0–55)
AST: 24 U/L (ref 5–34)
Alkaline Phosphatase: 174 U/L — ABNORMAL HIGH (ref 40–150)
Anion Gap: 7 mEq/L (ref 3–11)
BUN: 95.4 mg/dL — AB (ref 7.0–26.0)
CALCIUM: 8.8 mg/dL (ref 8.4–10.4)
CHLORIDE: 97 meq/L — AB (ref 98–109)
CO2: 25 mEq/L (ref 22–29)
Creatinine: 2.2 mg/dL — ABNORMAL HIGH (ref 0.6–1.1)
Glucose: 169 mg/dl — ABNORMAL HIGH (ref 70–140)
POTASSIUM: 4.2 meq/L (ref 3.5–5.1)
Sodium: 129 mEq/L — ABNORMAL LOW (ref 136–145)
Total Bilirubin: 1.14 mg/dL (ref 0.20–1.20)
Total Protein: 7.7 g/dL (ref 6.4–8.3)

## 2013-11-12 LAB — CHCC SMEAR

## 2013-11-12 LAB — LACTATE DEHYDROGENASE (CC13): LDH: 268 U/L — ABNORMAL HIGH (ref 125–245)

## 2013-11-12 LAB — IRON AND TIBC CHCC
%SAT: 30 % (ref 21–57)
Iron: 77 ug/dL (ref 41–142)
TIBC: 256 ug/dL (ref 236–444)
UIBC: 179 ug/dL (ref 120–384)

## 2013-11-12 LAB — FERRITIN CHCC: Ferritin: 230 ng/ml (ref 9–269)

## 2013-11-12 LAB — TECHNOLOGIST REVIEW

## 2013-11-12 NOTE — Progress Notes (Signed)
[facility] NOTE  Patient Care Team: Tresa Garter, MD as PCP - General (Internal Medicine) Heath Lark, MD as Consulting Physician (Hematology and Oncology) Lorayne Marek, MD as Referring Physician (Internal Medicine)  CHIEF COMPLAINTS/PURPOSE OF CONSULTATION:  Chronic pancytopenia  HISTORY OF PRESENTING ILLNESS:  [name] [name] 63 y.o. female is here because of progressive pancytopenia. The patient is a poor historian. History was obtained through and interpreter and information mainly was supplied by her son.  She was found to have abnormal CBC from blood test monitoring. I reviewed her CBC all the way to 2013 which were within normal limits. 08/22/2012, she has new onset of anemia.  Starting early this year, she started to have thrombocytopenia.  Most recently, starting September 2015, she has new onset of leukopenia  She denies recent chest pain on exertion, shortness of breath on minimal exertion, pre-syncopal episodes, or palpitations. She is almost wheelchair-bound due to recent congestive heart failure She had not noticed any recent bleeding such as epistaxis, hematuria or hematochezia The patient denies over the counter NSAID ingestion. Her last colonoscopy was unknown. Her most recent EGD on 03/13/2013 show no source of bleeding She had no prior history or diagnosis of cancer. Her age appropriate screening programs are up-to-date. She denies any pica and eats a variety of diet. She never donated blood. She had received blood transfusion  MEDICAL HISTORY:  Past Medical History  Diagnosis Date  . Hypertension   . Pneumonia   . CHF (congestive heart failure) 08/2012  . Anemia 08/2012  . UTI (urinary tract infection) 08/22/2012  . Ischemic cardiomyopathy     EF 30-35%  . Coronary artery disease   . Pulmonary hypertension   . NSVT (nonsustained ventricular tachycardia) 02/2013  . Diabetes mellitus     Reportedly diagnosed 2011 but no medication  initiated until 04/2011 (Metformin)  . Diabetic peripheral neuropathy     Since 2013  . Thrombocytopenia   . CKD (chronic kidney disease)   . Chronic systolic CHF (congestive heart failure)     a. EF 35-40% grade II DD 02/2013. b. Previously required short-term milrinone.  Marland Kitchen Uncontrolled diabetes mellitus   . HTN (hypertension)   . Pulmonary HTN     a. Cath 02/2013: severe pulm HTN.  . CAD (coronary artery disease)     a. Cath 02/2013: severe 2V CAD - 100% mRCA with L-R collaterals; diffuse 80-95% subtotal OM2 (too diffusely diseased to attempt PCA), overall small caliber diffusely diseased coronaries c/w poorly controlled DM; treated medically.  . CKD (chronic kidney disease), stage IV   . Ischemic cardiomyopathy   . Anemia     a. Prior normal EGD 03/2013. b. Progressive anemia 06/2013 in setting of transient hematemesis that had resolved.   . Thrombocytopenia     a. Liver imaging normal 03/2013.  . Edema     a. RUE edema with neg duplex for DVT 06/2013.  Marland Kitchen Protein calorie malnutrition   . Poor social situation   . Ascites     a. s/p paracentesis 03/2013.  . Valvular heart disease     a. Echo 06/2013: mild AI, mod MR, mod TR, mod PR.    SURGICAL HISTORY: Past Surgical History  Procedure Laterality Date  . Cesarean section    . Tubal ligation    . Esophagogastroduodenoscopy N/A 03/13/2013    Procedure: ESOPHAGOGASTRODUODENOSCOPY (EGD);  Surgeon: Winfield Cunas., MD;  Location: [facility] ENDOSCOPY;  Service: Endoscopy;  Laterality: N/A;  . Cardiac  catheterization  02/2013    severe 2 vessel CAD    SOCIAL HISTORY: History   Social History  . Marital Status: Married    Spouse Name: N/A    Number of Children: N/A  . Years of Education: N/A   Occupational History  . Not on file.   Social History Main Topics  . Smoking status: Never Smoker   . Smokeless tobacco: Never Used  . Alcohol Use: No  . Drug Use: No  . Sexual Activity: Not on file   Other Topics Concern  . Not on file    Social History Narrative   ** Merged History Encounter **       Patient lives with husband and son. Currently without insurance and therefore tries not seek medical care dt financial concern.     FAMILY HISTORY: Family History  Problem Relation Age of Onset  . Diabetes Brother     deceased in 16W dt DM complications    ALLERGIES:  has No Known Allergies.  MEDICATIONS:  Current Outpatient Prescriptions  Medication Sig Dispense Refill  . aspirin 81 MG EC tablet Take 1 tablet (81 mg total) by mouth daily.  90 tablet  3  . atorvastatin (LIPITOR) 10 MG tablet Take 1 tablet (10 mg total) by mouth daily at 6 PM.  30 tablet  0  . carvedilol (COREG) 6.25 MG tablet Take 1 tablet (6.25 mg total) by mouth 2 (two) times daily.  60 tablet  0  . isosorbide-hydrALAZINE (BIDIL) 20-37.5 MG per tablet Take 2 tablets by mouth 3 (three) times daily.  90 tablet  0  . potassium chloride SA (K-DUR,KLOR-CON) 20 MEQ tablet Take 20 mEq by mouth daily.      Marland Kitchen spironolactone (ALDACTONE) 25 MG tablet Take 1 tablet (25 mg total) by mouth daily.  30 tablet  0  . torsemide (DEMADEX) 20 MG tablet Take 2 tablets (40 mg total) by mouth 2 (two) times daily.  30 tablet  0   No current facility-administered medications for this visit.    REVIEW OF SYSTEMS:   Constitutional: Denies fevers, chills or abnormal night sweats Eyes: Denies blurriness of vision, double vision or watery eyes Ears, nose, mouth, throat, and face: Denies mucositis or sore throat Respiratory: Denies cough, dyspnea or wheezes Cardiovascular: Denies palpitation, chest discomfort or lower extremity swelling Gastrointestinal:  Denies nausea, heartburn or change in bowel habits Skin: Denies abnormal skin rashes Lymphatics: Denies new lymphadenopathy or easy bruising Neurological:Denies numbness, tingling or new weaknesses Behavioral/Psych: Mood is stable, no new changes  All other systems were reviewed with the patient and are  negative.  PHYSICAL EXAMINATION: ECOG PERFORMANCE STATUS: 2 - Symptomatic, <50% confined to bed  Filed Vitals:   11/12/13 1130  BP: 146/59  Pulse: 56  Temp: 98.1 F (36.7 C)  Resp: 18   Filed Weights   11/12/13 1130  Weight: 107 lb 9.6 oz (48.807 kg)    GENERAL:alert, no distress and comfortable. She looks thin and frail SKIN: skin color, texture, turgor are normal, no rashes or significant lesions EYES: normal, conjunctiva are pale and non-injected, sclera clear OROPHARYNX:no exudate, no erythema and lips, buccal mucosa, and tongue normal  NECK: supple, thyroid normal size, non-tender, without nodularity LYMPH:  no palpable lymphadenopathy in the cervical, axillary or inguinal LUNGS: clear to auscultation and percussion with normal breathing effort HEART: regular rate & rhythm with left heart murmurs and no lower extremity edema ABDOMEN:abdomen soft, non-tender and normal bowel sounds Musculoskeletal:no cyanosis of digits  and no clubbing  PSYCH: alert & oriented x 3 with fluent speech NEURO: no focal motor/sensory deficits  LABORATORY DATA:  I have reviewed the data as listed Lab Results  Component Value Date   WBC 2.8* 11/12/2013   HGB 8.9* 11/12/2013   HCT 27.1* 11/12/2013   MCV 93.1 11/12/2013   PLT 47 few Large platelets present* 11/12/2013   ASSESSMENT & PLAN:  Pancytopenia Clinically, the blood work is highly suspicious for undiagnosed myelodysplastic syndrome. I will order some basic blood work and will review this result with the patient and her son next week. She may need bone marrow aspirate and biopsy for definitive diagnosis but frankly speaking I do not know if she can tolerate any form of palliative chemotherapy due to her poor baseline performance status. Right now, she is not symptomatic with bleeding, infection or have symptomatic anemia.  Chronic systolic heart failure Clinically, she appears euvolemic    All questions were answered. The patient  knows to call the clinic with any problems, questions or concerns. I spent 40 minutes counseling the patient face to face. The total time spent in the appointment was 55 minutes and more than 50% was on counseling.     [facility], Salia Cangemi, MD 11/12/2013 9:04 PM

## 2013-11-12 NOTE — Assessment & Plan Note (Signed)
Clinically, she appears euvolemic 

## 2013-11-12 NOTE — Assessment & Plan Note (Signed)
Clinically, the blood work is highly suspicious for undiagnosed myelodysplastic syndrome. I will order some basic blood work and will review this result with the patient and her son next week. She may need bone marrow aspirate and biopsy for definitive diagnosis but frankly speaking I do not know if she can tolerate any form of palliative chemotherapy due to her poor baseline performance status. Right now, she is not symptomatic with bleeding, infection or have symptomatic anemia.

## 2013-11-14 LAB — VITAMIN B12: Vitamin B-12: 805 pg/mL (ref 211–911)

## 2013-11-14 LAB — ERYTHROPOIETIN: Erythropoietin: 37.2 m[IU]/mL — ABNORMAL HIGH (ref 2.6–18.5)

## 2013-11-14 LAB — BRAIN NATRIURETIC PEPTIDE: Brain Natriuretic Peptide: 2268.3 pg/mL — ABNORMAL HIGH (ref 0.0–100.0)

## 2013-11-14 LAB — DIRECT ANTIGLOBULIN TEST (NOT AT ARMC)
DAT (COMPLEMENT): NEGATIVE
DAT IgG: NEGATIVE

## 2013-11-14 LAB — SEDIMENTATION RATE: SED RATE: 32 mm/h — AB (ref 0–22)

## 2013-11-22 ENCOUNTER — Ambulatory Visit (HOSPITAL_BASED_OUTPATIENT_CLINIC_OR_DEPARTMENT_OTHER): Payer: Medicaid Other | Admitting: Hematology and Oncology

## 2013-11-22 ENCOUNTER — Encounter: Payer: Self-pay | Admitting: Hematology and Oncology

## 2013-11-22 ENCOUNTER — Telehealth: Payer: Self-pay | Admitting: Hematology and Oncology

## 2013-11-22 VITALS — BP 131/50 | HR 67 | Temp 97.3°F | Resp 18 | Ht 64.0 in | Wt 108.3 lb

## 2013-11-22 DIAGNOSIS — D61818 Other pancytopenia: Secondary | ICD-10-CM

## 2013-11-22 DIAGNOSIS — N184 Chronic kidney disease, stage 4 (severe): Secondary | ICD-10-CM

## 2013-11-22 DIAGNOSIS — I5022 Chronic systolic (congestive) heart failure: Secondary | ICD-10-CM

## 2013-11-22 NOTE — Telephone Encounter (Signed)
Pt confirmed labs/ov per 10/16 POF, gave pt AVS..... KJ

## 2013-11-23 ENCOUNTER — Encounter: Payer: Self-pay | Admitting: Hematology and Oncology

## 2013-11-23 NOTE — Assessment & Plan Note (Signed)
Clinically, she appears euvolemic 

## 2013-11-23 NOTE — Assessment & Plan Note (Signed)
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 The high percentage of eosinophils count is highly suspicious, along with her abnormal peripheral smear and multiple co-morbidities, her prognosis is not good. I discussed with her and her son the risk and benefit of performing bone marrow biopsy. Even though that might give [place] a conclusive diagnosis, due to her significant co-morbidities and poor baseline performance status, I would not recommend any form of systemic treatment. I recommend consideration for hospice but I do not believe the patient and her family members were ready. He would like her to try to optimizing medical management to get her kidney function and heart function under control. I think this is reasonable to revisit the issue again in another 2 months. In the meantime, I recommend transfusion support only. The patient will return every 2 weeks to have her blood work checked. If her hemoglobin is less than 8 g, she will get 1 unit of blood transfusion. If her platelet count is less than 10,000 or if she has clinical signs of bleeding, she will get 1 unit of platelets. There is no contraindication for her to remain on antiplatelet agents. The patient will need irradiated blood products.

## 2013-11-23 NOTE — Progress Notes (Signed)
[place] OFFICE PROGRESS NOTE  Patient Care Team: Tresa Garter, MD as PCP - General (Internal Medicine) Heath Lark, MD as Consulting Physician (Hematology and Oncology) Lorayne Marek, MD as Referring Physician (Internal Medicine)  SUMMARY OF ONCOLOGIC HISTORY: The patient is a poor historian. History was obtained through and interpreter and information mainly was supplied by her son.  She was found to have abnormal CBC from blood test monitoring. I reviewed her CBC all the way to 2013 which were within normal limits. 08/22/2012, she has new onset of anemia.  Starting early this year, she started to have thrombocytopenia.  Most recently, starting September 2015, she has new onset of leukopenia  She denies recent chest pain on exertion, shortness of breath on minimal exertion, pre-syncopal episodes, or palpitations. She is almost wheelchair-bound due to recent congestive heart failure She had not noticed any recent bleeding such as epistaxis, hematuria or hematochezia  INTERVAL HISTORY: Please see below for problem oriented charting. She denies new symptoms  REVIEW OF SYSTEMS:   All other systems were reviewed with the patient and are negative.  I have reviewed the past medical history, past surgical history, social history and family history with the patient and they are unchanged from previous note.  ALLERGIES:  has No Known Allergies.  MEDICATIONS:  Current Outpatient Prescriptions  Medication Sig Dispense Refill  . aspirin 81 MG EC tablet Take 1 tablet (81 mg total) by mouth daily.  90 tablet  3  . atorvastatin (LIPITOR) 10 MG tablet Take 1 tablet (10 mg total) by mouth daily at 6 PM.  30 tablet  0  . carvedilol (COREG) 6.25 MG tablet Take 1 tablet (6.25 mg total) by mouth 2 (two) times daily.  60 tablet  0  . isosorbide-hydrALAZINE (BIDIL) 20-37.5 MG per tablet Take 2 tablets by mouth 3 (three) times daily.  90 tablet  0  . potassium chloride SA  (K-DUR,KLOR-CON) 20 MEQ tablet Take 20 mEq by mouth daily.      Marland Kitchen spironolactone (ALDACTONE) 25 MG tablet Take 1 tablet (25 mg total) by mouth daily.  30 tablet  0  . torsemide (DEMADEX) 20 MG tablet Take 2 tablets (40 mg total) by mouth 2 (two) times daily.  30 tablet  0   No current facility-administered medications for this visit.    PHYSICAL EXAMINATION: ECOG PERFORMANCE STATUS: 2 - Symptomatic, <50% confined to bed  Filed Vitals:   11/22/13 1515  BP: 131/50  Pulse: 67  Temp: 97.3 F (36.3 C)  Resp: 18   Filed Weights   11/22/13 1515  Weight: 108 lb 4.8 oz (49.125 kg)    GENERAL:alert, no distress and comfortable. She looks thin and debilitated, sitting on the wheelchair. SKIN: skin color is pale, texture, turgor are normal, no rashes or significant lesions EYES: normal, Conjunctiva are pale and non-injected, sclera clear Musculoskeletal:no cyanosis of digits and no clubbing  NEURO: alert & oriented x 3 with fluent speech, no focal motor/sensory deficits  LABORATORY DATA:  I have reviewed the data as listed    Component Value Date/Time   NA 129* 11/12/2013 1205   NA 127* 10/30/2013 0531   K 4.2 11/12/2013 1205   K 4.7 10/30/2013 0531   CL 92* 10/30/2013 0531   CO2 25 11/12/2013 1205   CO2 23 10/30/2013 0531   GLUCOSE 169* 11/12/2013 1205   GLUCOSE 94 10/30/2013 0531   BUN 95.4* 11/12/2013 1205   BUN 88* 10/30/2013 0531   CREATININE 2.2*  11/12/2013 1205   CREATININE 1.86* 10/30/2013 0531   CREATININE 1.57* 01/27/2013 1315   CALCIUM 8.8 11/12/2013 1205   CALCIUM 8.3* 10/30/2013 0531   PROT 7.7 11/12/2013 1205   PROT 7.2 06/26/2013 0632   ALBUMIN 2.9* 11/12/2013 1205   ALBUMIN 2.3* 06/26/2013 0632   AST 24 11/12/2013 1205   AST 21 06/26/2013 0632   ALT 22 11/12/2013 1205   ALT 9 06/26/2013 0632   ALKPHOS 174* 11/12/2013 1205   ALKPHOS 135* 06/26/2013 0632   BILITOT 1.14 11/12/2013 1205   BILITOT 0.8 06/26/2013 0632   GFRNONAA 28* 10/30/2013 0531   GFRAA 32* 10/30/2013 0531    No  results found for this basename: SPEP,  UPEP,   kappa and lambda light chains    Lab Results  Component Value Date   WBC 2.8* 11/12/2013   NEUTROABS 1.0* 11/12/2013   HGB 8.9* 11/12/2013   HCT 27.1* 11/12/2013   MCV 93.1 11/12/2013   PLT 47 few Large platelets present* 11/12/2013      Chemistry      Component Value Date/Time   NA 129* 11/12/2013 1205   NA 127* 10/30/2013 0531   K 4.2 11/12/2013 1205   K 4.7 10/30/2013 0531   CL 92* 10/30/2013 0531   CO2 25 11/12/2013 1205   CO2 23 10/30/2013 0531   BUN 95.4* 11/12/2013 1205   BUN 88* 10/30/2013 0531   CREATININE 2.2* 11/12/2013 1205   CREATININE 1.86* 10/30/2013 0531   CREATININE 1.57* 01/27/2013 1315      Component Value Date/Time   CALCIUM 8.8 11/12/2013 1205   CALCIUM 8.3* 10/30/2013 0531   ALKPHOS 174* 11/12/2013 1205   ALKPHOS 135* 06/26/2013 0632   AST 24 11/12/2013 1205   AST 21 06/26/2013 0632   ALT 22 11/12/2013 1205   ALT 9 06/26/2013 0632   BILITOT 1.14 11/12/2013 1205   BILITOT 0.8 06/26/2013 5993     I reviewed her peripheral smear. It is grossly abnormal with elevated eosinophils. There is absolute leukopenia and thrombocytopenia.  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 The high percentage of eosinophils count is highly suspicious, along with her abnormal peripheral smear and multiple co-morbidities, her prognosis is not good. I discussed with her and her son the risk and benefit of performing bone marrow biopsy. Even though that might give [place] a conclusive diagnosis, due to her significant co-morbidities and poor baseline performance status, I would not recommend any form of systemic treatment. I recommend consideration for hospice but I do not believe the patient and her family members were ready. He would like her to try to optimizing medical management to get her kidney function and heart function under control. I think this  is reasonable to revisit the issue again in another 2 months. In the meantime, I recommend transfusion support only. The patient will return every 2 weeks to have her blood work checked. If her hemoglobin is less than 8 g, she will get 1 unit of blood transfusion. If her platelet count is less than 10,000 or if she has clinical signs of bleeding, she will get 1 unit of platelets. There is no contraindication for her to remain on antiplatelet agents. The patient will need irradiated blood products.  Chronic kidney disease (CKD), stage IV (severe) This is severe. Given her severe comorbidities, I doubt she is a dialysis candidate  Chronic systolic heart failure Clinically, she appears  euvolemic      Orders Placed This Encounter  Procedures  . CBC with Differential    Standing Status: Standing     Number of Occurrences: 9     Standing Expiration Date: 11/23/2014  . Hold Tube, Blood Bank    Standing Status: Standing     Number of Occurrences: 9     Standing Expiration Date: 11/23/2014   All questions were answered. The patient knows to call the clinic with any problems, questions or concerns. No barriers to learning was detected. I spent 25 minutes counseling the patient face to face. The total time spent in the appointment was 30 minutes and more than 50% was on counseling and review of test results     [facility], [place], MD 11/23/2013 8:35 PM

## 2013-11-23 NOTE — Assessment & Plan Note (Signed)
This is severe. Given her severe comorbidities, I doubt she is a dialysis candidate

## 2013-11-27 ENCOUNTER — Telehealth: Payer: Self-pay | Admitting: *Deleted

## 2013-11-27 ENCOUNTER — Ambulatory Visit (HOSPITAL_COMMUNITY)
Admission: RE | Admit: 2013-11-27 | Discharge: 2013-11-27 | Disposition: A | Discharge status: Home / Self Care | Payer: Medicaid Other | Source: Ambulatory Visit | Attending: Hematology and Oncology | Admitting: Hematology and Oncology

## 2013-11-27 ENCOUNTER — Ambulatory Visit: Payer: Medicaid Other

## 2013-11-27 ENCOUNTER — Other Ambulatory Visit: Payer: Self-pay | Admitting: Hematology and Oncology

## 2013-11-27 ENCOUNTER — Other Ambulatory Visit (HOSPITAL_BASED_OUTPATIENT_CLINIC_OR_DEPARTMENT_OTHER): Payer: Medicaid Other

## 2013-11-27 VITALS — BP 145/67 | HR 65 | Temp 97.5°F | Resp 17

## 2013-11-27 DIAGNOSIS — D630 Anemia in neoplastic disease: Secondary | ICD-10-CM

## 2013-11-27 DIAGNOSIS — D61818 Other pancytopenia: Secondary | ICD-10-CM

## 2013-11-27 LAB — CBC WITH DIFFERENTIAL/PLATELET
BASO%: 0.9 % (ref 0.0–2.0)
BASOS ABS: 0 10*3/uL (ref 0.0–0.1)
EOS%: 23.3 % — ABNORMAL HIGH (ref 0.0–7.0)
Eosinophils Absolute: 0.8 10*3/uL — ABNORMAL HIGH (ref 0.0–0.5)
HCT: 23.5 % — ABNORMAL LOW (ref 34.8–46.6)
HEMOGLOBIN: 7.7 g/dL — AB (ref 11.6–15.9)
LYMPH%: 16.1 % (ref 14.0–49.7)
MCH: 30.6 pg (ref 25.1–34.0)
MCHC: 32.8 g/dL (ref 31.5–36.0)
MCV: 93.3 fL (ref 79.5–101.0)
MONO#: 0.3 10*3/uL (ref 0.1–0.9)
MONO%: 7.8 % (ref 0.0–14.0)
NEUT#: 1.7 10*3/uL (ref 1.5–6.5)
NEUT%: 51.9 % (ref 38.4–76.8)
Platelets: 64 10*3/uL — ABNORMAL LOW (ref 145–400)
RBC: 2.52 10*6/uL — ABNORMAL LOW (ref 3.70–5.45)
RDW: 19.2 % — ABNORMAL HIGH (ref 11.2–14.5)
WBC: 3.2 10*3/uL — ABNORMAL LOW (ref 3.9–10.3)
lymph#: 0.5 10*3/uL — ABNORMAL LOW (ref 0.9–3.3)

## 2013-11-27 LAB — PREPARE RBC (CROSSMATCH)

## 2013-11-27 LAB — HOLD TUBE, BLOOD BANK

## 2013-11-27 NOTE — Telephone Encounter (Signed)
Spoke w/ son and informed of pt's Hgb 7.7 need for blood transfusion per Dr. Alvy Bimler.  Instructed for pt to return today at 3 pm for transfusion.  He will speak to his mother and call us back to confirm.  Son called back to confirm his mother will return at 3 pm for transfusion today.

## 2013-11-27 NOTE — Progress Notes (Signed)
Patient removed blood band bracelet last night; rescheduled to receive blood tomorrow (11/28/13).

## 2013-11-28 ENCOUNTER — Ambulatory Visit (HOSPITAL_BASED_OUTPATIENT_CLINIC_OR_DEPARTMENT_OTHER): Payer: Medicaid Other

## 2013-11-28 VITALS — BP 150/80 | HR 70 | Temp 98.0°F | Resp 18

## 2013-11-28 DIAGNOSIS — N184 Chronic kidney disease, stage 4 (severe): Secondary | ICD-10-CM

## 2013-11-28 DIAGNOSIS — D631 Anemia in chronic kidney disease: Secondary | ICD-10-CM

## 2013-11-28 DIAGNOSIS — D630 Anemia in neoplastic disease: Secondary | ICD-10-CM

## 2013-11-28 LAB — ABO/RH: ABO/RH(D): B POS

## 2013-11-28 MED ORDER — SODIUM CHLORIDE 0.9 % IV SOLN
250.0000 mL | Freq: Once | INTRAVENOUS | Status: AC
  Administered 2013-11-28: 250 mL via INTRAVENOUS

## 2013-11-28 NOTE — Patient Instructions (Signed)

## 2013-11-29 LAB — TYPE AND SCREEN
ABO/RH(D): B POS
ANTIBODY SCREEN: NEGATIVE
Unit division: 0

## 2013-12-11 ENCOUNTER — Other Ambulatory Visit (HOSPITAL_BASED_OUTPATIENT_CLINIC_OR_DEPARTMENT_OTHER): Payer: Medicaid Other

## 2013-12-11 DIAGNOSIS — D61818 Other pancytopenia: Secondary | ICD-10-CM

## 2013-12-11 DIAGNOSIS — N184 Chronic kidney disease, stage 4 (severe): Secondary | ICD-10-CM

## 2013-12-11 DIAGNOSIS — D631 Anemia in chronic kidney disease: Secondary | ICD-10-CM

## 2013-12-11 LAB — CBC WITH DIFFERENTIAL/PLATELET
BASO%: 0.8 % (ref 0.0–2.0)
BASOS ABS: 0 10*3/uL (ref 0.0–0.1)
EOS%: 51.1 % — ABNORMAL HIGH (ref 0.0–7.0)
Eosinophils Absolute: 2.5 10*3/uL — ABNORMAL HIGH (ref 0.0–0.5)
HEMATOCRIT: 26.2 % — AB (ref 34.8–46.6)
HEMOGLOBIN: 8.5 g/dL — AB (ref 11.6–15.9)
LYMPH%: 7.8 % — AB (ref 14.0–49.7)
MCH: 32.4 pg (ref 25.1–34.0)
MCHC: 32.4 g/dL (ref 31.5–36.0)
MCV: 99.9 fL (ref 79.5–101.0)
MONO#: 0.3 10*3/uL (ref 0.1–0.9)
MONO%: 5.7 % (ref 0.0–14.0)
NEUT%: 34.6 % — AB (ref 38.4–76.8)
NEUTROS ABS: 1.7 10*3/uL (ref 1.5–6.5)
PLATELETS: 51 10*3/uL — AB (ref 145–400)
RBC: 2.62 10*6/uL — ABNORMAL LOW (ref 3.70–5.45)
RDW: 23.5 % — ABNORMAL HIGH (ref 11.2–14.5)
WBC: 4.8 10*3/uL (ref 3.9–10.3)
lymph#: 0.4 10*3/uL — ABNORMAL LOW (ref 0.9–3.3)

## 2013-12-11 LAB — HOLD TUBE, BLOOD BANK

## 2013-12-21 ENCOUNTER — Inpatient Hospital Stay (HOSPITAL_COMMUNITY)
Admission: EM | Admit: 2013-12-21 | Discharge: 2014-01-10 | DRG: 291 | Disposition: A | Discharge status: Home Health | Payer: Medicaid Other | Attending: Internal Medicine | Admitting: Internal Medicine

## 2013-12-21 ENCOUNTER — Emergency Department (HOSPITAL_COMMUNITY): Payer: Medicaid Other

## 2013-12-21 ENCOUNTER — Encounter (HOSPITAL_COMMUNITY): Payer: Self-pay | Admitting: Emergency Medicine

## 2013-12-21 DIAGNOSIS — N179 Acute kidney failure, unspecified: Secondary | ICD-10-CM | POA: Diagnosis present

## 2013-12-21 DIAGNOSIS — I5023 Acute on chronic systolic (congestive) heart failure: Secondary | ICD-10-CM

## 2013-12-21 DIAGNOSIS — T68XXXA Hypothermia, initial encounter: Secondary | ICD-10-CM | POA: Insufficient documentation

## 2013-12-21 DIAGNOSIS — R0689 Other abnormalities of breathing: Secondary | ICD-10-CM

## 2013-12-21 DIAGNOSIS — I2510 Atherosclerotic heart disease of native coronary artery without angina pectoris: Secondary | ICD-10-CM | POA: Diagnosis present

## 2013-12-21 DIAGNOSIS — R740 Nonspecific elevation of levels of transaminase and lactic acid dehydrogenase [LDH]: Secondary | ICD-10-CM | POA: Diagnosis present

## 2013-12-21 DIAGNOSIS — E1143 Type 2 diabetes mellitus with diabetic autonomic (poly)neuropathy: Secondary | ICD-10-CM | POA: Insufficient documentation

## 2013-12-21 DIAGNOSIS — A498 Other bacterial infections of unspecified site: Secondary | ICD-10-CM | POA: Diagnosis not present

## 2013-12-21 DIAGNOSIS — Z452 Encounter for adjustment and management of vascular access device: Secondary | ICD-10-CM

## 2013-12-21 DIAGNOSIS — I130 Hypertensive heart and chronic kidney disease with heart failure and stage 1 through stage 4 chronic kidney disease, or unspecified chronic kidney disease: Principal | ICD-10-CM | POA: Diagnosis present

## 2013-12-21 DIAGNOSIS — R0600 Dyspnea, unspecified: Secondary | ICD-10-CM | POA: Insufficient documentation

## 2013-12-21 DIAGNOSIS — D638 Anemia in other chronic diseases classified elsewhere: Secondary | ICD-10-CM | POA: Insufficient documentation

## 2013-12-21 DIAGNOSIS — I255 Ischemic cardiomyopathy: Secondary | ICD-10-CM | POA: Diagnosis present

## 2013-12-21 DIAGNOSIS — D469 Myelodysplastic syndrome, unspecified: Secondary | ICD-10-CM | POA: Diagnosis present

## 2013-12-21 DIAGNOSIS — I509 Heart failure, unspecified: Secondary | ICD-10-CM

## 2013-12-21 DIAGNOSIS — D696 Thrombocytopenia, unspecified: Secondary | ICD-10-CM | POA: Diagnosis present

## 2013-12-21 DIAGNOSIS — N390 Urinary tract infection, site not specified: Secondary | ICD-10-CM | POA: Insufficient documentation

## 2013-12-21 DIAGNOSIS — D46Z Other myelodysplastic syndromes: Secondary | ICD-10-CM | POA: Diagnosis present

## 2013-12-21 DIAGNOSIS — N184 Chronic kidney disease, stage 4 (severe): Secondary | ICD-10-CM | POA: Diagnosis present

## 2013-12-21 DIAGNOSIS — T82838A Hemorrhage of vascular prosthetic devices, implants and grafts, initial encounter: Secondary | ICD-10-CM

## 2013-12-21 DIAGNOSIS — A499 Bacterial infection, unspecified: Secondary | ICD-10-CM | POA: Diagnosis not present

## 2013-12-21 DIAGNOSIS — Z833 Family history of diabetes mellitus: Secondary | ICD-10-CM

## 2013-12-21 DIAGNOSIS — R188 Other ascites: Secondary | ICD-10-CM | POA: Diagnosis present

## 2013-12-21 DIAGNOSIS — E1142 Type 2 diabetes mellitus with diabetic polyneuropathy: Secondary | ICD-10-CM | POA: Diagnosis present

## 2013-12-21 DIAGNOSIS — Z7982 Long term (current) use of aspirin: Secondary | ICD-10-CM | POA: Diagnosis not present

## 2013-12-21 DIAGNOSIS — I5043 Acute on chronic combined systolic (congestive) and diastolic (congestive) heart failure: Secondary | ICD-10-CM | POA: Diagnosis present

## 2013-12-21 DIAGNOSIS — E876 Hypokalemia: Secondary | ICD-10-CM | POA: Diagnosis not present

## 2013-12-21 DIAGNOSIS — E1165 Type 2 diabetes mellitus with hyperglycemia: Secondary | ICD-10-CM | POA: Diagnosis present

## 2013-12-21 DIAGNOSIS — R509 Fever, unspecified: Secondary | ICD-10-CM | POA: Diagnosis not present

## 2013-12-21 DIAGNOSIS — Z515 Encounter for palliative care: Secondary | ICD-10-CM | POA: Insufficient documentation

## 2013-12-21 DIAGNOSIS — R680 Hypothermia, not associated with low environmental temperature: Secondary | ICD-10-CM | POA: Diagnosis present

## 2013-12-21 DIAGNOSIS — E8770 Fluid overload, unspecified: Secondary | ICD-10-CM

## 2013-12-21 DIAGNOSIS — L26 Exfoliative dermatitis: Secondary | ICD-10-CM | POA: Insufficient documentation

## 2013-12-21 DIAGNOSIS — B965 Pseudomonas (aeruginosa) (mallei) (pseudomallei) as the cause of diseases classified elsewhere: Secondary | ICD-10-CM | POA: Diagnosis not present

## 2013-12-21 DIAGNOSIS — E785 Hyperlipidemia, unspecified: Secondary | ICD-10-CM | POA: Diagnosis present

## 2013-12-21 DIAGNOSIS — R630 Anorexia: Secondary | ICD-10-CM | POA: Insufficient documentation

## 2013-12-21 DIAGNOSIS — R21 Rash and other nonspecific skin eruption: Secondary | ICD-10-CM

## 2013-12-21 DIAGNOSIS — R7881 Bacteremia: Secondary | ICD-10-CM | POA: Diagnosis not present

## 2013-12-21 DIAGNOSIS — R6510 Systemic inflammatory response syndrome (SIRS) of non-infectious origin without acute organ dysfunction: Secondary | ICD-10-CM

## 2013-12-21 DIAGNOSIS — D631 Anemia in chronic kidney disease: Secondary | ICD-10-CM | POA: Diagnosis present

## 2013-12-21 DIAGNOSIS — E440 Moderate protein-calorie malnutrition: Secondary | ICD-10-CM | POA: Diagnosis present

## 2013-12-21 DIAGNOSIS — I5041 Acute combined systolic (congestive) and diastolic (congestive) heart failure: Secondary | ICD-10-CM

## 2013-12-21 DIAGNOSIS — I959 Hypotension, unspecified: Secondary | ICD-10-CM | POA: Diagnosis present

## 2013-12-21 DIAGNOSIS — Z95828 Presence of other vascular implants and grafts: Secondary | ICD-10-CM

## 2013-12-21 DIAGNOSIS — N189 Chronic kidney disease, unspecified: Secondary | ICD-10-CM | POA: Diagnosis present

## 2013-12-21 DIAGNOSIS — I272 Other secondary pulmonary hypertension: Secondary | ICD-10-CM | POA: Diagnosis present

## 2013-12-21 DIAGNOSIS — I878 Other specified disorders of veins: Secondary | ICD-10-CM

## 2013-12-21 DIAGNOSIS — E46 Unspecified protein-calorie malnutrition: Secondary | ICD-10-CM | POA: Diagnosis present

## 2013-12-21 DIAGNOSIS — I10 Essential (primary) hypertension: Secondary | ICD-10-CM | POA: Diagnosis present

## 2013-12-21 DIAGNOSIS — L282 Other prurigo: Secondary | ICD-10-CM

## 2013-12-21 DIAGNOSIS — IMO0002 Reserved for concepts with insufficient information to code with codable children: Secondary | ICD-10-CM | POA: Diagnosis present

## 2013-12-21 DIAGNOSIS — E8779 Other fluid overload: Secondary | ICD-10-CM

## 2013-12-21 LAB — CBC WITH DIFFERENTIAL/PLATELET
Basophils Absolute: 0 10*3/uL (ref 0.0–0.1)
Basophils Relative: 1 % (ref 0–1)
EOS ABS: 1 10*3/uL — AB (ref 0.0–0.7)
EOS PCT: 28 % — AB (ref 0–5)
HEMATOCRIT: 23.9 % — AB (ref 36.0–46.0)
Hemoglobin: 7.7 g/dL — ABNORMAL LOW (ref 12.0–15.0)
Lymphocytes Relative: 10 % — ABNORMAL LOW (ref 12–46)
Lymphs Abs: 0.4 10*3/uL — ABNORMAL LOW (ref 0.7–4.0)
MCH: 32.6 pg (ref 26.0–34.0)
MCHC: 32.2 g/dL (ref 30.0–36.0)
MCV: 101.3 fL — ABNORMAL HIGH (ref 78.0–100.0)
MONO ABS: 0.4 10*3/uL (ref 0.1–1.0)
Monocytes Relative: 11 % (ref 3–12)
NEUTROS ABS: 1.7 10*3/uL (ref 1.7–7.7)
Neutrophils Relative %: 50 % (ref 43–77)
Platelets: 37 10*3/uL — ABNORMAL LOW (ref 150–400)
RBC: 2.36 MIL/uL — AB (ref 3.87–5.11)
RDW: 21.6 % — ABNORMAL HIGH (ref 11.5–15.5)
WBC: 3.5 10*3/uL — ABNORMAL LOW (ref 4.0–10.5)

## 2013-12-21 LAB — COMPREHENSIVE METABOLIC PANEL
ALK PHOS: 163 U/L — AB (ref 39–117)
ALT: 35 U/L (ref 0–35)
AST: 45 U/L — ABNORMAL HIGH (ref 0–37)
Albumin: 2.8 g/dL — ABNORMAL LOW (ref 3.5–5.2)
Anion gap: 19 — ABNORMAL HIGH (ref 5–15)
BUN: 128 mg/dL — AB (ref 6–23)
CALCIUM: 8.1 mg/dL — AB (ref 8.4–10.5)
CO2: 17 mEq/L — ABNORMAL LOW (ref 19–32)
Chloride: 97 mEq/L (ref 96–112)
Creatinine, Ser: 3.45 mg/dL — ABNORMAL HIGH (ref 0.50–1.10)
GFR calc non Af Amer: 13 mL/min — ABNORMAL LOW (ref >90)
GFR, EST AFRICAN AMERICAN: 15 mL/min — AB (ref >90)
GLUCOSE: 163 mg/dL — AB (ref 70–99)
POTASSIUM: 5.8 meq/L — AB (ref 3.7–5.3)
Sodium: 133 mEq/L — ABNORMAL LOW (ref 137–147)
TOTAL PROTEIN: 7.1 g/dL (ref 6.0–8.3)
Total Bilirubin: 0.8 mg/dL (ref 0.3–1.2)

## 2013-12-21 LAB — I-STAT TROPONIN, ED: Troponin i, poc: 0.03 ng/mL (ref 0.00–0.08)

## 2013-12-21 LAB — URINALYSIS, ROUTINE W REFLEX MICROSCOPIC
BILIRUBIN URINE: NEGATIVE
Glucose, UA: NEGATIVE mg/dL
HGB URINE DIPSTICK: NEGATIVE
Ketones, ur: NEGATIVE mg/dL
Leukocytes, UA: NEGATIVE
Nitrite: NEGATIVE
PH: 5 (ref 5.0–8.0)
Protein, ur: NEGATIVE mg/dL
SPECIFIC GRAVITY, URINE: 1.013 (ref 1.005–1.030)
Urobilinogen, UA: 0.2 mg/dL (ref 0.0–1.0)

## 2013-12-21 LAB — I-STAT CG4 LACTIC ACID, ED: Lactic Acid, Venous: 1.78 mmol/L (ref 0.5–2.2)

## 2013-12-21 MED ORDER — VANCOMYCIN HCL IN DEXTROSE 1-5 GM/200ML-% IV SOLN
1000.0000 mg | Freq: Once | INTRAVENOUS | Status: AC
  Administered 2013-12-21: 1000 mg via INTRAVENOUS
  Filled 2013-12-21: qty 200

## 2013-12-21 MED ORDER — VANCOMYCIN HCL 500 MG IV SOLR: 500.0000 mg | INTRAVENOUS | Status: DC

## 2013-12-21 MED ORDER — PIPERACILLIN-TAZOBACTAM IN DEX 2-0.25 GM/50ML IV SOLN
2.2500 g | Freq: Three times a day (TID) | INTRAVENOUS | Status: DC
  Administered 2013-12-22: 2.25 g via INTRAVENOUS
  Filled 2013-12-21 (×3): qty 50

## 2013-12-21 MED ORDER — PIPERACILLIN-TAZOBACTAM 3.375 G IVPB 30 MIN
3.3750 g | Freq: Once | INTRAVENOUS | Status: AC
  Administered 2013-12-21: 3.375 g via INTRAVENOUS
  Filled 2013-12-21: qty 50

## 2013-12-21 NOTE — H&P (Signed)
[facility]                                                                                    Patient Demographics  [name] [name], is a 63 y.o. female  CSN: [id number]  MRN: [id number]  DOB - 24-Mar-1950  Admit Date - 12/21/2013  Outpatient Primary MD for the patient is [name] Chessman, MD   With History of -  Past Medical History  Diagnosis Date  . Hypertension   . Pneumonia   . CHF (congestive heart failure) 08/2012  . Anemia 08/2012  . UTI (urinary tract infection) 08/22/2012  . Ischemic cardiomyopathy     EF 30-35%  . Coronary artery disease   . Pulmonary hypertension   . NSVT (nonsustained ventricular tachycardia) 02/2013  . Diabetes mellitus     Reportedly diagnosed 2011 but no medication initiated until 04/2011 (Metformin)  . Diabetic peripheral neuropathy     Since 2013  . Thrombocytopenia   . CKD (chronic kidney disease)   . Chronic systolic CHF (congestive heart failure)     a. EF 35-40% grade II DD 02/2013. b. Previously required short-term milrinone.  Marland Kitchen Uncontrolled diabetes mellitus   . HTN (hypertension)   . Pulmonary HTN     a. Cath 02/2013: severe pulm HTN.  . CAD (coronary artery disease)     a. Cath 02/2013: severe 2V CAD - 100% mRCA with L-R collaterals; diffuse 80-95% subtotal OM2 (too diffusely diseased to attempt PCA), overall small caliber diffusely diseased coronaries c/w poorly controlled DM; treated medically.  . CKD (chronic kidney disease), stage IV   . Ischemic cardiomyopathy   . Anemia     a. Prior normal EGD 03/2013. b. Progressive anemia 06/2013 in setting of transient hematemesis that had resolved.   . Thrombocytopenia     a. Liver imaging normal 03/2013.  . Edema     a. RUE edema with neg duplex for DVT 06/2013.  Marland Kitchen Protein calorie malnutrition   . Poor social situation   . Ascites     a. s/p paracentesis 03/2013.  . Valvular heart disease     a. Echo 06/2013: mild AI, mod MR, mod TR, mod PR.      Past Surgical History   Procedure Laterality Date  . Cesarean section    . Tubal ligation    . Esophagogastroduodenoscopy N/A 03/13/2013    Procedure: ESOPHAGOGASTRODUODENOSCOPY (EGD);  Surgeon: Winfield Cunas., MD;  Location: [facility] ENDOSCOPY;  Service: Endoscopy;  Laterality: N/A;  . Cardiac catheterization  02/2013    severe 2 vessel CAD    in for   Chief Complaint  Patient presents with  . Shortness of Breath     HPI  [name] [name]  is a 63 y.o. female, With the past medical history significant for coronary artery disease, congestive heart failure with echo done on 10/16/2013 showing ejection fraction of 30-35% and ascites status post paracentesis on 03/2013 presenting with increasing shortness of breath for the last few days with decreased urinary output. Patient is on torsemide twice daily and reports being compliant with her medications. Most of the history was taken from the son who reports that  his mother complains of increaseditching as well theophylline patient was recently diagnosed with rapidly progressing MDS and is being followed at the oncology clinicwith frequent blood transfusions. Her physician at that time considered placing her on hospice however the family and the patient were not ready. No history of chest pains, palpitations, nausea or vomiting. No history of diarrhea. The son reports that she has a chronic rash and always complained of itching which has been worse in the last few days.patient also has history of chronic kidney disease stage IV but due to her comorbidities, dialysis has not been discussed.the son also reports the onset of chills today but no feverishness. In the emergency room the patient was found to be hypothermic and the bear hugger was placed.    Review of Systems    In addition to the HPI above,   No Headache, No changes with Vision or hearing, No problems swallowing food or Liquids, No Chest pain, Cough   No Nausea or Vommitting, Bowel movements are regular, No Blood in  stool or Urine, No dysuria, No new skin rashes or bruises, No new joints pains-aches,  No new weakness, tingling, numbness in any extremity, No polyuria, polydypsia or polyphagia, No significant Mental Stressors.  A full 10 point Review of Systems was done, except as stated above, all other Review of Systems were negative.   Social History History  Substance Use Topics  . Smoking status: Never Smoker   . Smokeless tobacco: Never Used  . Alcohol Use: No     Family History Family History  Problem Relation Age of Onset  . Diabetes Brother     deceased in 99I dt DM complications     Prior to Admission medications   Medication Sig Start Date End Date Taking? Authorizing Provider  aspirin 81 MG EC tablet Take 1 tablet (81 mg total) by mouth daily. 04/08/13  Yes Tresa Garter, MD  atorvastatin (LIPITOR) 10 MG tablet Take 1 tablet (10 mg total) by mouth daily at 6 PM. 10/21/13  Yes Sahar Alene Mires, PA-C  carvedilol (COREG) 3.125 MG tablet Take 3.125 mg by mouth 2 (two) times daily with a meal.   Yes Historical Provider, MD  hydrALAZINE (APRESOLINE) 100 MG tablet Take 100 mg by mouth 2 (two) times daily.   Yes Historical Provider, MD  isosorbide mononitrate (IMDUR) 60 MG 24 hr tablet Take 60 mg by mouth daily. 11/08/13  Yes Historical Provider, MD  torsemide (DEMADEX) 20 MG tablet Take 2 tablets (40 mg total) by mouth 2 (two) times daily. 10/21/13  Yes Lacy Duverney, PA-C    No Known Allergies  Physical Exam  Vitals  Blood pressure 127/49, pulse 48, temperature 94.1 F (34.5 C), temperature source Rectal, resp. rate 14, height 5\' 4"  (1.626 m), weight 48.988 kg (108 lb), SpO2 100 %.   1. General elderly female, well-developed  2. Drowsy, fully alert when awakened.  3. No Gross F.N deficits, ALL C.Nerves Intact, .  4.  Eyes appear Normal, Conjunctivae clear, PERRLA.bloody excoriations noted in the right ear Moist Oral Mucosa.  5. Supple Neck, No JVD, No cervical  lymphadenopathy appriciated, No Carotid Bruits.  6. Symmetrical Chest wall movement, bilateral crackles.  7. RRR, No Gallops, systolic ejection murmur noted, No Parasternal Heave.  8. Positive Bowel Sounds, Abdomen Soft, mild generalized tenderness with ascites, No rebound -guarding or rigidity.  9.  a generalized excoriating rash noted involving the whole body with redness.  10.  joints appear normal , no effusions, Normal  ROM.  11. No Palpable Lymph Nodes in Neck or Axillae    Data Review  CBC  Recent Labs Lab 12/21/13 2041  WBC 3.5*  HGB 7.7*  HCT 23.9*  PLT 37*  MCV 101.3*  MCH 32.6  MCHC 32.2  RDW 21.6*  LYMPHSABS 0.4*  MONOABS 0.4  EOSABS 1.0*  BASOSABS 0.0   ------------------------------------------------------------------------------------------------------------------  Chemistries   Recent Labs Lab 12/21/13 2041  NA 133*  K 5.8*  CL 97  CO2 17*  GLUCOSE 163*  BUN 128*  CREATININE 3.45*  CALCIUM 8.1*  AST 45*  ALT 35  ALKPHOS 163*  BILITOT 0.8   ------------------------------------------------------------------------------------------------------------------ estimated creatinine clearance is 12.7 mL/min (by C-G formula based on Cr of 3.45). ------------------------------------------------------------------------------------------------------------------ No results for input(s): TSH, T4TOTAL, T3FREE, THYROIDAB in the last 72 hours.  Invalid input(s): FREET3   Coagulation profile No results for input(s): INR, PROTIME in the last 168 hours. ------------------------------------------------------------------------------------------------------------------- No results for input(s): DDIMER in the last 72 hours. -------------------------------------------------------------------------------------------------------------------  Cardiac Enzymes No results for input(s): CKMB, TROPONINI, MYOGLOBIN in the last 168 hours.  Invalid input(s):  CK ------------------------------------------------------------------------------------------------------------------ Invalid input(s): POCBNP   ---------------------------------------------------------------------------------------------------------------  Urinalysis    Component Value Date/Time   COLORURINE YELLOW 12/21/2013 2206   APPEARANCEUR CLEAR 12/21/2013 2206   LABSPEC 1.013 12/21/2013 2206   PHURINE 5.0 12/21/2013 2206   GLUCOSEU NEGATIVE 12/21/2013 2206   HGBUR NEGATIVE 12/21/2013 2206   BILIRUBINUR NEGATIVE 12/21/2013 2206   BILIRUBINUR neg 01/19/2013 1247   KETONESUR NEGATIVE 12/21/2013 2206   PROTEINUR NEGATIVE 12/21/2013 2206   PROTEINUR 3+ 01/19/2013 1247   UROBILINOGEN 0.2 12/21/2013 2206   UROBILINOGEN 0.2 01/19/2013 1247   NITRITE NEGATIVE 12/21/2013 2206   NITRITE neg 01/19/2013 1247   LEUKOCYTESUR NEGATIVE 12/21/2013 2206    ----------------------------------------------------------------------------------------------------------------     Imaging results:   Dg Chest Port 1 View  12/21/2013   CLINICAL DATA:  Shortness of breath  EXAM: PORTABLE CHEST - 1 VIEW  COMPARISON:  10/30/2013  FINDINGS: There is tenting of the diaphragm. There is bilateral mild interstitial thickening. There is no focal parenchymal opacity, pleural effusion, or pneumothorax. There is stable cardiomegaly. There is thoracic aortic atherosclerosis.  The osseous structures are unremarkable.  IMPRESSION: Cardiomegaly with pulmonary vascular congestion.   Electronically Signed   By: Kathreen Devoid   On: 12/21/2013 21:23    My personal review of EKG: sinus bradycardia at 50 bpm with right axis deviation, low voltage QRS and possible old anteroseptal MI  Personally reviewed Old Chart from outpatient clinic and oncology  Assessment & Plan  1. Congestive heart failure; echocardiogram done on 10/16/2013 shows ejection fraction 30-35% 2. Acute on chronic kidney disease with symptoms of  end-stage renal disease like itching and fluid overload 3. SIRS with hypothermia ,NOS; probably cellulitis due to increased scratching 4. Ascites 5. Excoriated pink rash 6. MDS/advanced and followed at the oncology clinic 7. History of coronary artery disease with history of severe 2 vessel disease  Plan  End of life measures  discussed with the family .they still want full code Consult nephrology in a.m.although I don't think that the patient is a dialysis candidate due to comorbidities Lasix 80 mg IV  Now Fluid restriction Vancomycin and Zosyn IV Check blood cultures Grave prognosis  DVT Prophylaxis Lovenox  AM Labs Ordered, also please review Full Orders  Family Communication: Admission, patients condition and plan of care including tests being ordered have been discussed with the patient and son who indicate understanding and agree with  the plan and Code Status.  Code Status full  Disposition Plan: unknown  Time spent in minutes : 42 minutes  Condition critical   @SIGNATURE @

## 2013-12-21 NOTE — ED Notes (Signed)
Pt to ED with c/o shortness of breath.  Pt's son st's pt has been swelling x's 1 week.  Today started to c/o shortness of breath.  Pt takes Lasix everyday but has only been voiding small amounts.  Pt also c/o mid chest pain

## 2013-12-21 NOTE — ED Notes (Signed)
Admitting at bedside 

## 2013-12-21 NOTE — ED Provider Notes (Signed)
CSN: [id number]     Arrival date & time 12/21/13  2006 History   First MD Initiated Contact with Patient 12/21/13 2025     Chief Complaint  Patient presents with  . Shortness of Breath     (Consider location/radiation/quality/duration/timing/severity/associated sxs/prior Treatment) HPI Comments: This is a 63 year old female with an extensive past medical history including CHF, anemia, CK D, thrombocytopenia, ascites and diabetes who presents to the emergency department with her son from home complaining of shortness of breath over the past week increasing today. Patient states it feels as if there is fluid in her heart and lungs. States her legs have been swelling along with her abdomen. She takes Lasix on a daily basis, however has decreased urine output over the past week. Her son states she has been voiding very small amounts. Denies any chest pain, nausea or vomiting. States she just does not feel well. She has a rash all over her body that has been present for a few weeks. Her son states she is constantly scratching at the rash. He reports patient appears to have cold chills intermittently. Unknown if she's had a fever. Last month she had a blood transfusion for anemia.  PCP- Angelica Chessman, MD   Patient is a 63 y.o. female presenting with shortness of breath. The history is provided by the patient and a relative.  Shortness of Breath   Past Medical History  Diagnosis Date  . Hypertension   . Pneumonia   . CHF (congestive heart failure) 08/2012  . Anemia 08/2012  . UTI (urinary tract infection) 08/22/2012  . Ischemic cardiomyopathy     EF 30-35%  . Coronary artery disease   . Pulmonary hypertension   . NSVT (nonsustained ventricular tachycardia) 02/2013  . Diabetes mellitus     Reportedly diagnosed 2011 but no medication initiated until 04/2011 (Metformin)  . Diabetic peripheral neuropathy     Since 2013  . Thrombocytopenia   . CKD (chronic kidney disease)   . Chronic  systolic CHF (congestive heart failure)     a. EF 35-40% grade II DD 02/2013. b. Previously required short-term milrinone.  Marland Kitchen Uncontrolled diabetes mellitus   . HTN (hypertension)   . Pulmonary HTN     a. Cath 02/2013: severe pulm HTN.  . CAD (coronary artery disease)     a. Cath 02/2013: severe 2V CAD - 100% mRCA with L-R collaterals; diffuse 80-95% subtotal OM2 (too diffusely diseased to attempt PCA), overall small caliber diffusely diseased coronaries c/w poorly controlled DM; treated medically.  . CKD (chronic kidney disease), stage IV   . Ischemic cardiomyopathy   . Anemia     a. Prior normal EGD 03/2013. b. Progressive anemia 06/2013 in setting of transient hematemesis that had resolved.   . Thrombocytopenia     a. Liver imaging normal 03/2013.  . Edema     a. RUE edema with neg duplex for DVT 06/2013.  Marland Kitchen Protein calorie malnutrition   . Poor social situation   . Ascites     a. s/p paracentesis 03/2013.  . Valvular heart disease     a. Echo 06/2013: mild AI, mod MR, mod TR, mod PR.   Past Surgical History  Procedure Laterality Date  . Cesarean section    . Tubal ligation    . Esophagogastroduodenoscopy N/A 03/13/2013    Procedure: ESOPHAGOGASTRODUODENOSCOPY (EGD);  Surgeon: Winfield Cunas., MD;  Location[facility] ENDOSCOPY;  Service: Endoscopy;  Laterality: N/A;  . Cardiac catheterization  02/2013  severe 2 vessel CAD   Family History  Problem Relation Age of Onset  . Diabetes Brother     deceased in 32T dt DM complications   History  Substance Use Topics  . Smoking status: Never Smoker   . Smokeless tobacco: Never Used  . Alcohol Use: No   OB History    No data available     Review of Systems  Respiratory: Positive for shortness of breath.    10 Systems reviewed and are negative for acute change except as noted in the HPI.   Allergies  Review of patient's allergies indicates no known allergies.  Home Medications   Prior to Admission medications   Medication Sig  Start Date End Date Taking? Authorizing Provider  aspirin 81 MG EC tablet Take 1 tablet (81 mg total) by mouth daily. 04/08/13  Yes Tresa Garter, MD  atorvastatin (LIPITOR) 10 MG tablet Take 1 tablet (10 mg total) by mouth daily at 6 PM. 10/21/13  Yes Sahar Alene Mires, PA-C  carvedilol (COREG) 3.125 MG tablet Take 3.125 mg by mouth 2 (two) times daily with a meal.   Yes Historical Provider, MD  hydrALAZINE (APRESOLINE) 100 MG tablet Take 100 mg by mouth 2 (two) times daily.   Yes Historical Provider, MD  isosorbide mononitrate (IMDUR) 60 MG 24 hr tablet Take 60 mg by mouth daily. 11/08/13  Yes Historical Provider, MD  torsemide (DEMADEX) 20 MG tablet Take 2 tablets (40 mg total) by mouth 2 (two) times daily. 10/21/13  Yes Sahar Osman, PA-C   BP 127/49 mmHg  Pulse 48  Temp(Src) 94.1 F (34.5 C) (Rectal)  Resp 14  Ht 5\' 4"  (1.626 m)  Wt 108 lb (48.988 kg)  BMI 18.53 kg/m2  SpO2 100% Physical Exam  Constitutional: She is oriented to person, place, and time. She appears ill. She appears distressed (mild).  HENT:  Head: Normocephalic and atraumatic.  Mouth/Throat: No oral lesions.  Dry MM.  Eyes: Conjunctivae are normal.  Neck: Neck supple.  Cardiovascular: Regular rhythm and intact distal pulses.  Bradycardia present.   +2 pitting edema LE bilateral.  Pulmonary/Chest: She has rhonchi (bilateral lung bases).  Increased effort of breathing.  Abdominal: She exhibits ascites. There is no tenderness.  Neurological: She is alert and oriented to person, place, and time.  Skin:  Scaling skin throughout body. Spares palms/soles/mucosa.  Nursing note and vitals reviewed.   ED Course  Procedures (including critical care time) Labs Review Labs Reviewed  CBC WITH DIFFERENTIAL - Abnormal; Notable for the following:    WBC 3.5 (*)    RBC 2.36 (*)    Hemoglobin 7.7 (*)    HCT 23.9 (*)    MCV 101.3 (*)    RDW 21.6 (*)    Platelets 37 (*)    Lymphocytes Relative 10 (*)    Eosinophils Relative  28 (*)    Lymphs Abs 0.4 (*)    Eosinophils Absolute 1.0 (*)    All other components within normal limits  COMPREHENSIVE METABOLIC PANEL - Abnormal; Notable for the following:    Sodium 133 (*)    Potassium 5.8 (*)    CO2 17 (*)    Glucose, Bld 163 (*)    BUN 128 (*)    Creatinine, Ser 3.45 (*)    Calcium 8.1 (*)    Albumin 2.8 (*)    AST 45 (*)    Alkaline Phosphatase 163 (*)    GFR calc non Af Amer 13 (*)  GFR calc Af Amer 15 (*)    Anion gap 19 (*)    All other components within normal limits  CULTURE, BLOOD (ROUTINE X 2)  CULTURE, BLOOD (ROUTINE X 2)  URINE CULTURE  URINALYSIS, ROUTINE W REFLEX MICROSCOPIC  PRO B NATRIURETIC PEPTIDE  I-STAT TROPOININ, ED  I-STAT CG4 LACTIC ACID, ED  TYPE AND SCREEN    Imaging Review Dg Chest Port 1 View  12/21/2013   CLINICAL DATA:  Shortness of breath  EXAM: PORTABLE CHEST - 1 VIEW  COMPARISON:  10/30/2013  FINDINGS: There is tenting of the diaphragm. There is bilateral mild interstitial thickening. There is no focal parenchymal opacity, pleural effusion, or pneumothorax. There is stable cardiomegaly. There is thoracic aortic atherosclerosis.  The osseous structures are unremarkable.  IMPRESSION: Cardiomegaly with pulmonary vascular congestion.   Electronically Signed   By: Kathreen Devoid   On: 12/21/2013 21:23     EKG Interpretation   Date/Time:  Saturday December 21 2013 20:09:31 EST Ventricular Rate:  50 PR Interval:  206 QRS Duration: 100 QT Interval:  512 QTC Calculation: 466 R Axis:   115 Text Interpretation:  Sinus bradycardia Right axis deviation Low voltage  QRS Cannot rule out Anterior infarct , age undetermined Abnormal ECG No  significant change since Sept 2015 Confirmed by Regenia Skeeter  MD, SCOTT ([id number])  on 12/21/2013 8:42:04 PM      MDM   Final diagnoses:  Hypothermia, initial encounter  CHF exacerbation  Acute renal failure, unspecified acute renal failure type  SIRS (systemic inflammatory response syndrome)    Pt ill appearing and in mild distress. Hypothermic and bradycardic. BP stable. Pt put on bear hugger. Rales at lung bases on exam with extremity edema and ascites. Level II code sepsis called given temp and leukopenia. Broad spectrum abx started (vanc/zosyn). Renal functions worsened, acute on chronic renal failure. Hgb 7.7, recent transfusion last month for the same. UA negative. CXR showing cardiomegaly with pulmonary vascular congestion. Rash does not appear to be concerning for SJS/ TEN. Pt admitted to step-down bed, admission accepted by Dr. Laren Everts, [facility].  Discussed with attending Dr. Regenia Skeeter who also evaluated patient and agrees with plan of care.   Carman Ching, PA-C 12/21/13 [id number]  Ephraim Hamburger, MD 12/22/13 [phone]

## 2013-12-21 NOTE — Progress Notes (Signed)
ANTIBIOTIC CONSULT NOTE - INITIAL  Pharmacy Consult for Vancomycin and Zosyn Indication: sepsis  No Known Allergies  Patient Measurements: Height: 5\' 4"  (162.6 cm) (from 11/22/13 record) Weight: 108 lb (48.988 kg) (per ED report) IBW/kg (Calculated) : 54.7   Vital Signs: Temp: 93.4 F (34.1 C) (11/14 2050) Temp Source: Rectal (11/14 2050) BP: 135/73 mmHg (11/14 2050) Pulse Rate: 48 (11/14 2050) Intake/Output from previous day:   Intake/Output from this shift:    Labs:  Recent Labs  12/21/13 2041  WBC 3.5*  HGB 7.7*  PLT PENDING  CREATININE 3.45*   Estimated Creatinine Clearance: 12.7 mL/min (by C-G formula based on Cr of 3.45). No results for input(s): VANCOTROUGH, VANCOPEAK, VANCORANDOM, GENTTROUGH, GENTPEAK, GENTRANDOM, TOBRATROUGH, TOBRAPEAK, TOBRARND, AMIKACINPEAK, AMIKACINTROU, AMIKACIN in the last 72 hours.   Microbiology: No results found for this or any previous visit (from the past 720 hour(s)).  Medical History: Past Medical History  Diagnosis Date  . Hypertension   . Pneumonia   . CHF (congestive heart failure) 08/2012  . Anemia 08/2012  . UTI (urinary tract infection) 08/22/2012  . Ischemic cardiomyopathy     EF 30-35%  . Coronary artery disease   . Pulmonary hypertension   . NSVT (nonsustained ventricular tachycardia) 02/2013  . Diabetes mellitus     Reportedly diagnosed 2011 but no medication initiated until 04/2011 (Metformin)  . Diabetic peripheral neuropathy     Since 2013  . Thrombocytopenia   . CKD (chronic kidney disease)   . Chronic systolic CHF (congestive heart failure)     a. EF 35-40% grade II DD 02/2013. b. Previously required short-term milrinone.  Marland Kitchen Uncontrolled diabetes mellitus   . HTN (hypertension)   . Pulmonary HTN     a. Cath 02/2013: severe pulm HTN.  . CAD (coronary artery disease)     a. Cath 02/2013: severe 2V CAD - 100% mRCA with L-R collaterals; diffuse 80-95% subtotal OM2 (too diffusely diseased to attempt PCA),  overall small caliber diffusely diseased coronaries c/w poorly controlled DM; treated medically.  . CKD (chronic kidney disease), stage IV   . Ischemic cardiomyopathy   . Anemia     a. Prior normal EGD 03/2013. b. Progressive anemia 06/2013 in setting of transient hematemesis that had resolved.   . Thrombocytopenia     a. Liver imaging normal 03/2013.  . Edema     a. RUE edema with neg duplex for DVT 06/2013.  Marland Kitchen Protein calorie malnutrition   . Poor social situation   . Ascites     a. s/p paracentesis 03/2013.  . Valvular heart disease     a. Echo 06/2013: mild AI, mod MR, mod TR, mod PR.    Medications:  See medication reconciliation  Assessment: 63 y.o female presented to ED with c/o shortness of breath and mild chest pain.  Starting IV vancomycin and zosyn for sepsis.  Has h/o CKD, SCr = 3.45, estimated CrCl ~ 12.7 ml/min.   Goal of Therapy:  Vancomycin trough level 15-20 mcg/ml  Plan:  Zosyn 3.375 g IV x 1 given in ED then will give Zosyn 2.25 g IV q8 hr.  Vancomycin 1000 mg x 1 igiven in ED then 500 mg IV q48h. Monitor clinical status, renal function daily and check vancomycin trough at steady state.   Nicole Cella, RPh Clinical Pharmacist Pager: [phone] 12/21/2013,9:36 PM

## 2013-12-22 ENCOUNTER — Encounter (HOSPITAL_COMMUNITY): Payer: Self-pay | Admitting: Physician Assistant

## 2013-12-22 DIAGNOSIS — E46 Unspecified protein-calorie malnutrition: Secondary | ICD-10-CM | POA: Diagnosis present

## 2013-12-22 DIAGNOSIS — N189 Chronic kidney disease, unspecified: Secondary | ICD-10-CM

## 2013-12-22 DIAGNOSIS — E1165 Type 2 diabetes mellitus with hyperglycemia: Secondary | ICD-10-CM

## 2013-12-22 DIAGNOSIS — D631 Anemia in chronic kidney disease: Secondary | ICD-10-CM

## 2013-12-22 DIAGNOSIS — R188 Other ascites: Secondary | ICD-10-CM

## 2013-12-22 DIAGNOSIS — N184 Chronic kidney disease, stage 4 (severe): Secondary | ICD-10-CM | POA: Diagnosis present

## 2013-12-22 DIAGNOSIS — E1142 Type 2 diabetes mellitus with diabetic polyneuropathy: Secondary | ICD-10-CM

## 2013-12-22 DIAGNOSIS — I255 Ischemic cardiomyopathy: Secondary | ICD-10-CM

## 2013-12-22 DIAGNOSIS — D696 Thrombocytopenia, unspecified: Secondary | ICD-10-CM | POA: Diagnosis present

## 2013-12-22 DIAGNOSIS — IMO0002 Reserved for concepts with insufficient information to code with codable children: Secondary | ICD-10-CM | POA: Diagnosis present

## 2013-12-22 DIAGNOSIS — I5043 Acute on chronic combined systolic (congestive) and diastolic (congestive) heart failure: Secondary | ICD-10-CM

## 2013-12-22 DIAGNOSIS — D46Z Other myelodysplastic syndromes: Secondary | ICD-10-CM

## 2013-12-22 DIAGNOSIS — E785 Hyperlipidemia, unspecified: Secondary | ICD-10-CM | POA: Diagnosis present

## 2013-12-22 DIAGNOSIS — N179 Acute kidney failure, unspecified: Secondary | ICD-10-CM | POA: Diagnosis present

## 2013-12-22 DIAGNOSIS — I10 Essential (primary) hypertension: Secondary | ICD-10-CM

## 2013-12-22 LAB — CBC
HEMATOCRIT: 22.3 % — AB (ref 36.0–46.0)
Hemoglobin: 7.4 g/dL — ABNORMAL LOW (ref 12.0–15.0)
MCH: 33.5 pg (ref 26.0–34.0)
MCHC: 33.2 g/dL (ref 30.0–36.0)
MCV: 100.9 fL — AB (ref 78.0–100.0)
Platelets: 39 10*3/uL — ABNORMAL LOW (ref 150–400)
RBC: 2.21 MIL/uL — ABNORMAL LOW (ref 3.87–5.11)
RDW: 21.5 % — AB (ref 11.5–15.5)
WBC: 4.2 10*3/uL (ref 4.0–10.5)

## 2013-12-22 LAB — GLUCOSE, CAPILLARY
GLUCOSE-CAPILLARY: 90 mg/dL (ref 70–99)
Glucose-Capillary: 83 mg/dL (ref 70–99)
Glucose-Capillary: 141 mg/dL — ABNORMAL HIGH (ref 70–99)

## 2013-12-22 LAB — BASIC METABOLIC PANEL
Anion gap: 18 — ABNORMAL HIGH (ref 5–15)
BUN: 128 mg/dL — ABNORMAL HIGH (ref 6–23)
CALCIUM: 8.4 mg/dL (ref 8.4–10.5)
CHLORIDE: 98 meq/L (ref 96–112)
CO2: 18 meq/L — AB (ref 19–32)
CREATININE: 3.66 mg/dL — AB (ref 0.50–1.10)
GFR calc Af Amer: 14 mL/min — ABNORMAL LOW (ref >90)
GFR calc non Af Amer: 12 mL/min — ABNORMAL LOW (ref >90)
GLUCOSE: 100 mg/dL — AB (ref 70–99)
Potassium: 5.5 mEq/L — ABNORMAL HIGH (ref 3.7–5.3)
SODIUM: 134 meq/L — AB (ref 137–147)

## 2013-12-22 LAB — ABO/RH: ABO/RH(D): B POS

## 2013-12-22 LAB — PROTIME-INR
INR: 1.51 — ABNORMAL HIGH (ref 0.00–1.49)
Prothrombin Time: 18.3 seconds — ABNORMAL HIGH (ref 11.6–15.2)

## 2013-12-22 LAB — MRSA PCR SCREENING: MRSA by PCR: NEGATIVE

## 2013-12-22 LAB — TROPONIN I
Troponin I: <0.3 ng/mL (ref <0.30)
Troponin I: <0.3 ng/mL (ref <0.30)

## 2013-12-22 LAB — TSH: TSH: 6.88 u[IU]/mL — ABNORMAL HIGH (ref 0.350–4.500)

## 2013-12-22 LAB — PRO B NATRIURETIC PEPTIDE: PRO B NATRI PEPTIDE: 15092 pg/mL — AB (ref 0–125)

## 2013-12-22 MED ORDER — MILRINONE IN DEXTROSE 20 MG/100ML IV SOLN
0.1250 ug/kg/min | INTRAVENOUS | Status: DC
  Administered 2013-12-22 – 2013-12-31 (×6): 0.125 ug/kg/min via INTRAVENOUS
  Filled 2013-12-22 (×8): qty 100

## 2013-12-22 MED ORDER — HYDRALAZINE HCL 50 MG PO TABS
100.0000 mg | ORAL_TABLET | Freq: Two times a day (BID) | ORAL | Status: DC
  Administered 2013-12-22: 100 mg via ORAL
  Filled 2013-12-22 (×2): qty 2

## 2013-12-22 MED ORDER — CAMPHOR-MENTHOL 0.5-0.5 % EX LOTN
TOPICAL_LOTION | CUTANEOUS | Status: DC | PRN
  Filled 2013-12-22: qty 222

## 2013-12-22 MED ORDER — ONDANSETRON HCL 4 MG/2ML IJ SOLN: 4.0000 mg | Freq: Four times a day (QID) | INTRAMUSCULAR | Status: DC | PRN

## 2013-12-22 MED ORDER — ONDANSETRON HCL 4 MG PO TABS: 4.0000 mg | ORAL_TABLET | Freq: Four times a day (QID) | ORAL | Status: DC | PRN

## 2013-12-22 MED ORDER — LUBRIDERM SERIOUSLY SENSITIVE EX LOTN
TOPICAL_LOTION | Freq: Two times a day (BID) | CUTANEOUS | Status: DC
  Administered 2013-12-22: 1 via TOPICAL
  Administered 2013-12-22 – 2013-12-27 (×10): via TOPICAL
  Administered 2013-12-28: 1 via TOPICAL
  Administered 2013-12-28: 22:00:00 via TOPICAL
  Administered 2013-12-28: 1 via TOPICAL
  Administered 2013-12-29: 23:00:00 via TOPICAL
  Administered 2013-12-29: 1 via TOPICAL
  Administered 2013-12-30: 22:00:00 via TOPICAL
  Administered 2013-12-30 – 2013-12-31 (×2): 1 via TOPICAL
  Administered 2013-12-31 – 2014-01-02 (×4): via TOPICAL
  Administered 2014-01-02: 1 via TOPICAL
  Administered 2014-01-03 (×2): via TOPICAL
  Administered 2014-01-04: 1 via TOPICAL
  Administered 2014-01-04: 22:00:00 via TOPICAL
  Administered 2014-01-05: 1 via TOPICAL
  Administered 2014-01-06 – 2014-01-10 (×9): via TOPICAL
  Filled 2013-12-22 (×2): qty 562

## 2013-12-22 MED ORDER — ACETAMINOPHEN 325 MG PO TABS
650.0000 mg | ORAL_TABLET | Freq: Four times a day (QID) | ORAL | Status: DC | PRN
  Administered 2014-01-04: 650 mg via ORAL
  Filled 2013-12-22: qty 2

## 2013-12-22 MED ORDER — SODIUM CHLORIDE 0.9 % IJ SOLN: 3.0000 mL | INTRAMUSCULAR | Status: DC | PRN

## 2013-12-22 MED ORDER — SODIUM CHLORIDE 0.9 % IJ SOLN
3.0000 mL | Freq: Two times a day (BID) | INTRAMUSCULAR | Status: DC
  Administered 2013-12-22 – 2013-12-24 (×5): 3 mL via INTRAVENOUS

## 2013-12-22 MED ORDER — SODIUM CHLORIDE 0.9 % IV SOLN
250.0000 mL | INTRAVENOUS | Status: DC | PRN
  Administered 2013-12-24: 250 mL via INTRAVENOUS

## 2013-12-22 MED ORDER — ATORVASTATIN CALCIUM 10 MG PO TABS
10.0000 mg | ORAL_TABLET | Freq: Every day | ORAL | Status: DC
  Administered 2013-12-22 – 2013-12-25 (×3): 10 mg via ORAL
  Filled 2013-12-22 (×4): qty 1

## 2013-12-22 MED ORDER — OXYCODONE HCL 5 MG PO TABS
5.0000 mg | ORAL_TABLET | ORAL | Status: DC | PRN
  Administered 2013-12-26 – 2014-01-06 (×2): 5 mg via ORAL
  Filled 2013-12-22 (×2): qty 1

## 2013-12-22 MED ORDER — ASPIRIN EC 81 MG PO TBEC
81.0000 mg | DELAYED_RELEASE_TABLET | Freq: Every day | ORAL | Status: DC
  Administered 2013-12-22 – 2013-12-26 (×5): 81 mg via ORAL
  Filled 2013-12-22 (×6): qty 1

## 2013-12-22 MED ORDER — CARVEDILOL 3.125 MG PO TABS
3.1250 mg | ORAL_TABLET | Freq: Two times a day (BID) | ORAL | Status: DC
  Filled 2013-12-22 (×3): qty 1

## 2013-12-22 MED ORDER — HYDROXYZINE HCL 25 MG PO TABS
25.0000 mg | ORAL_TABLET | Freq: Four times a day (QID) | ORAL | Status: DC | PRN
  Administered 2013-12-25 – 2014-01-08 (×3): 25 mg via ORAL
  Filled 2013-12-22 (×4): qty 1

## 2013-12-22 MED ORDER — TORSEMIDE 20 MG PO TABS
40.0000 mg | ORAL_TABLET | Freq: Two times a day (BID) | ORAL | Status: DC
  Administered 2013-12-22: 40 mg via ORAL
  Filled 2013-12-22 (×3): qty 2

## 2013-12-22 MED ORDER — FUROSEMIDE 10 MG/ML IJ SOLN
80.0000 mg | Freq: Once | INTRAMUSCULAR | Status: AC
  Administered 2013-12-22: 80 mg via INTRAVENOUS
  Filled 2013-12-22: qty 8

## 2013-12-22 MED ORDER — ACETAMINOPHEN 650 MG RE SUPP: 650.0000 mg | Freq: Four times a day (QID) | RECTAL | Status: DC | PRN

## 2013-12-22 MED ORDER — ZOLPIDEM TARTRATE 5 MG PO TABS: 5.0000 mg | ORAL_TABLET | Freq: Every evening | ORAL | Status: DC | PRN

## 2013-12-22 MED ORDER — FUROSEMIDE 10 MG/ML IJ SOLN
160.0000 mg | Freq: Four times a day (QID) | INTRAVENOUS | Status: DC
  Administered 2013-12-22 – 2013-12-31 (×36): 160 mg via INTRAVENOUS
  Filled 2013-12-22 (×39): qty 16

## 2013-12-22 MED ORDER — SODIUM CHLORIDE 0.9 % IJ SOLN
3.0000 mL | Freq: Two times a day (BID) | INTRAMUSCULAR | Status: DC
  Administered 2013-12-22 – 2013-12-25 (×5): 3 mL via INTRAVENOUS

## 2013-12-22 MED ORDER — CETYLPYRIDINIUM CHLORIDE 0.05 % MT LIQD
7.0000 mL | Freq: Two times a day (BID) | OROMUCOSAL | Status: DC
  Administered 2013-12-22 – 2014-01-10 (×39): 7 mL via OROMUCOSAL

## 2013-12-22 MED ORDER — ISOSORBIDE MONONITRATE ER 60 MG PO TB24
60.0000 mg | ORAL_TABLET | Freq: Every day | ORAL | Status: DC
  Administered 2013-12-22 – 2013-12-31 (×10): 60 mg via ORAL
  Filled 2013-12-22 (×11): qty 1

## 2013-12-22 MED ORDER — INSULIN ASPART 100 UNIT/ML ~~LOC~~ SOLN
0.0000 [IU] | Freq: Three times a day (TID) | SUBCUTANEOUS | Status: DC
  Administered 2013-12-22 – 2013-12-23 (×3): 1 [IU] via SUBCUTANEOUS
  Administered 2013-12-24: 3 [IU] via SUBCUTANEOUS
  Administered 2013-12-26: 2 [IU] via SUBCUTANEOUS
  Administered 2013-12-26: 1 [IU] via SUBCUTANEOUS
  Administered 2013-12-27: 2 [IU] via SUBCUTANEOUS
  Administered 2013-12-28 (×2): 3 [IU] via SUBCUTANEOUS
  Administered 2013-12-28: 5 [IU] via SUBCUTANEOUS
  Administered 2013-12-29 (×2): 2 [IU] via SUBCUTANEOUS

## 2013-12-22 NOTE — Consult Note (Signed)
Cardiology Consultation   Patient ID: [name] [name]; [id number]; 12/12/1950   Admit date: 12/21/2013 Date of Consult: 12/22/2013  Primary Physician: Angelica Chessman, MD Cardiologist: Dr. Liam Rogers  CHF:  Dr. Glori Bickers    Reason for Consultation: CHF  History of Present Illness:  [name] [name] is a 63 y.o. female with a hx of CAD, ischemic cardiomyopathy with EF 37-04%, chronic systolic HF, predominantly venous pulmonary HTN, CKD stage 4, DM2, HTN, anemia.  LHC in 02/2013 demonstrated severe 2v CAD with 100% mRCA with L-R collats, diffuse 80-95% subtotal OM2 (small caliber diffusely diseased vessels - diabetic vessels) >> tx medically.  She has been seen by the Advanced HF team in the past with poor FU.  She has required Milrinone in the past and has also undergone paracentesis for ascites.  She was admitted in 10/2013 with VDRF 2/2 a/c systolic HF in the setting of LGI bleed.  She was recently dx with progressing MDS and sees Dr. Alvy Bimler.    She presented to the ED 12/21/2013 with increasing dyspnea and decreased UOP.  BNP is 15K.  Creatinine is worse (2.2 >> 3.45).  Hgb is close to recent baseline at 7.4 although had been higher in the past.  CXR demonstrates pulmonary vascular congestion.  She was noted to hypothermic (SIRS).  Nephrology has seen the patient for a/c renal failure.  Cardiology is asked to help manage HF.  The patient does not speak English so a accurate history from the patient could not be obtained.    Past Medical History  Diagnosis Date  . Hypertension   . Pneumonia   . CHF (congestive heart failure) 08/2012  . Anemia 08/2012  . UTI (urinary tract infection) 08/22/2012  . Ischemic cardiomyopathy     EF 30-35%  . Coronary artery disease   . Pulmonary hypertension   . NSVT (nonsustained ventricular tachycardia) 02/2013  . Diabetes mellitus     Reportedly diagnosed 2011 but no medication initiated until 04/2011 (Metformin)  . Diabetic peripheral neuropathy    Since 2013  . Thrombocytopenia   . CKD (chronic kidney disease)   . Chronic systolic CHF (congestive heart failure)     Echo (10/16/13):  EF 30% to 35%. Akinesis of inf-lat and inferior, anterior, anterolateral, and apical myocardium. Restrictive physiology.  Mild AI, MAC, mod MR, mild LAE, mild RVE, mild reduced RVSF, mild RAE, mod TR, Mod PI, PASP 79 mmHg, L pleural effusion.  Marland Kitchen Uncontrolled diabetes mellitus   . HTN (hypertension)   . Pulmonary HTN     a. Cath 02/2013: severe pulm HTN.  . CAD (coronary artery disease)     a. Cath 02/2013: severe 2V CAD - 100% mRCA with L-R collaterals; diffuse 80-95% subtotal OM2 (too diffusely diseased to attempt PCA), overall small caliber diffusely diseased coronaries c/w poorly controlled DM; treated medically.  . CKD (chronic kidney disease), stage IV   . Ischemic cardiomyopathy   . Anemia     a. Prior normal EGD 03/2013. b. Progressive anemia 06/2013 in setting of transient hematemesis that had resolved.   . Thrombocytopenia     a. Liver imaging normal 03/2013.  . Edema     a. RUE edema with neg duplex for DVT 06/2013.  Marland Kitchen Protein calorie malnutrition   . Poor social situation   . Ascites     a. s/p paracentesis 03/2013.  . Valvular heart disease     a. Echo 06/2013: mild AI, mod MR, mod TR, mod PR.  Past Surgical History  Procedure Laterality Date  . Cesarean section    . Tubal ligation    . Esophagogastroduodenoscopy N/A 03/13/2013    Procedure: ESOPHAGOGASTRODUODENOSCOPY (EGD);  Surgeon: Winfield Cunas., MD;  Location[facility] ENDOSCOPY;  Service: Endoscopy;  Laterality: N/A;  . Cardiac catheterization  02/2013    severe 2 vessel CAD      Home Meds:  Prior to Admission medications   Medication Sig Start Date End Date Taking? Authorizing Provider  aspirin 81 MG EC tablet Take 1 tablet (81 mg total) by mouth daily. 04/08/13  Yes Tresa Garter, MD  atorvastatin (LIPITOR) 10 MG tablet Take 1 tablet (10 mg total) by mouth daily at 6 PM.  10/21/13  Yes Sahar Alene Mires, PA-C  carvedilol (COREG) 3.125 MG tablet Take 3.125 mg by mouth 2 (two) times daily with a meal.   Yes Historical Provider, MD  hydrALAZINE (APRESOLINE) 100 MG tablet Take 100 mg by mouth 2 (two) times daily.   Yes Historical Provider, MD  isosorbide mononitrate (IMDUR) 60 MG 24 hr tablet Take 60 mg by mouth daily. 11/08/13  Yes Historical Provider, MD  torsemide (DEMADEX) 20 MG tablet Take 2 tablets (40 mg total) by mouth 2 (two) times daily. 10/21/13  Yes Lacy Duverney, PA-C     Current Medications:  . antiseptic oral rinse  7 mL Mouth Rinse BID  . aspirin EC  81 mg Oral Daily  . atorvastatin  10 mg Oral q1800  . hydrALAZINE  100 mg Oral BID  . insulin aspart  0-9 Units Subcutaneous TID WC  . isosorbide mononitrate  60 mg Oral Daily  . lubriderm seriously sensitive   Topical BID  . piperacillin-tazobactam (ZOSYN)  IV  2.25 g Intravenous 3 times per day  . sodium chloride  3 mL Intravenous Q12H  . sodium chloride  3 mL Intravenous Q12H  . torsemide  40 mg Oral BID  . [START ON 12/23/2013] vancomycin  500 mg Intravenous Q48H      Allergies:   No Known Allergies   Social History:   The patient  reports that she has never smoked. She has never used smokeless tobacco. She reports that she does not drink alcohol or use illicit drugs.     Family History:   The patient's family history includes Diabetes in her brother.    ROS:  Please see the history of present illness.    All other systems reviewed and negative.    Vital Signs: Blood pressure 130/45, pulse 58, temperature 98 F (36.7 C), temperature source Oral, resp. rate 10, height 5' (1.524 m), weight 138 lb 3.7 oz (62.7 kg), SpO2 99 %.   PHYSICAL EXAM:  General:  Well nourished, well developed, in no acute distress HEENT: normal Lymph: no adenopathy Neck: no JVD Endocrine:  No thryomegaly Vascular: No carotid bruits; FA pulses 2+ bilaterally without bruits Cardiac:  normal S1, S2; RRR; no  murmur Lungs:  Crackles at bases bilaterally Abd: appears tight to palpitations and mildly tender Ext: trace edema Musculoskeletal:  No deformities, BUE and BLE strength normal and equal Skin: warm and dry Neuro:  CNs 2-12 intact, no focal abnormalities noted Psych:  Normal affect    EKG:  Sinus brady, HR 50, RAD, PR 206 ms, low voltage, IVCD   Labs:   Recent Labs  12/22/13 0525 12/22/13 1010  TROPONINI <0.30 <0.30    Lab Results  Component Value Date   WBC 4.2 12/22/2013   HGB 7.4* 12/22/2013  HCT 22.3* 12/22/2013   MCV 100.9* 12/22/2013   PLT 39* 12/22/2013     Recent Labs Lab 12/21/13 2041 12/22/13 0525  NA 133* 134*  K 5.8* 5.5*  CL 97 98  CO2 17* 18*  BUN 128* 128*  CREATININE 3.45* 3.66*  CALCIUM 8.1* 8.4  PROT 7.1  --   BILITOT 0.8  --   ALKPHOS 163*  --   ALT 35  --   AST 45*  --   GLUCOSE 163* 100*     Radiology/Studies:   Dg Chest Port 1 View    12/21/2013   CLINICAL DATA:  Shortness of breath  EXAM: PORTABLE CHEST - 1 VIEW  COMPARISON:  10/30/2013  FINDINGS: There is tenting of the diaphragm. There is bilateral mild interstitial thickening. There is no focal parenchymal opacity, pleural effusion, or pneumothorax. There is stable cardiomegaly. There is thoracic aortic atherosclerosis.  The osseous structures are unremarkable.  IMPRESSION: Cardiomegaly with pulmonary vascular congestion.   Electronically Signed   By: Kathreen Devoid   On: 12/21/2013 21:23     ASSESSMENT:  Principal Problem:   Acute on chronic combined systolic and diastolic congestive heart failure Active Problems:   Acute renal failure   Essential hypertension   Hyperlipidemia   Uncontrolled type 2 diabetes mellitus with peripheral neuropathy   CKD (chronic kidney disease), stage IV   Protein calorie malnutrition   Ischemic cardiomyopathy   Anemia in chronic renal disease   Thrombocytopenia   Ascites   Acute renal failure syndrome   MDS (myelodysplastic syndrome),  high grade     PLAN:  1.  She has benefited from Milrinone infusion in the past to help with low output CHF.  Will try low dose Milrinone and hopefully improve renal blood flow and promote diuresis.  2.  Discussed Paracentesis (which she has benefited from in the past) with Dr. [place] Pink with Hospitalist but currently she is very thrombocytopenic.  He will discuss with Radiology - may need platelet transfusion prior to procedure.  3.  Hydralazine and BB stopped due to hypotension 4.  Discussed with Dr. Haroldine Laws and he will place a central line in am so we can follow co ox   Signed, Fransico Him, MD [facility] 12/22/2013

## 2013-12-22 NOTE — Consult Note (Signed)
[facility] Renal Consultation Note  Requesting MD: [place] Pink Indication for Consultation: A on CKD  HPI:  [name] [name] is a 63 y.o. [name] with past medical history significant for diabetes mellitus, hypertension both poorly controlled. She also has a significant cardiac history to include severe coronary artery disease and ischemic cardiomyopathy with an ejection fraction of 30-35%. Also note of severe pulmonary hypertension. In addition she is noted to have advanced myelodysplastic syndrome requiring multiple transfusions.  She also appears to have CKD date back to 2014.  She had some acute on chronic kidney disease after cardiac catheterization early in 2015 but creatinines between 1.8-2.0 range for the last several months. Last known creatinine was September 23 of 1.86. She was admitted yesterday by the hospitalists for congestive heart failure with symptoms. Patient claims compliance with her twice a day torsemide. It should be noted that patient is non-English speaking. The history that I give is obtained from the chart which was obtained from patient's son. Would also is gleaned from the chart is that due to her severe myelodysplastic syndrome she was being considered for palliative care but the family was not ready. On admission yesterday creatinine was noted to be 3.45. Her urinalysis as well as several urinalyses in the past or completely negative. Urine output is scant with only 250 mL recorded since admission. Blood pressure has been good, not too low or too high.  Fortunately her son was at bedside today. He acts as her primary caregiver. He reports she is not taking any nonsteroidal anti-inflammatory drugs. He states he only changes that have taken place over the last couple of months is that her blood pressure medications have been decreased. Patient denies dizziness. Her major complaint is of itching and this rash which he says has worsened over the last couple of weeks.  She denies nausea.  CREATININE  Date/Time Value Ref Range Status  11/12/2013 12:05 PM 2.2* 0.6 - 1.1 mg/dL Final   CREAT  Date/Time Value Ref Range Status  01/27/2013 01:15 PM 1.57* 0.50 - 1.10 mg/dL Final  01/21/2013 08:32 PM 2.09* 0.50 - 1.10 mg/dL Final  01/19/2013 12:42 PM 1.44* 0.50 - 1.10 mg/dL Final   CREATININE, SER  Date/Time Value Ref Range Status  12/22/2013 05:25 AM 3.66* 0.50 - 1.10 mg/dL Final  12/21/2013 08:41 PM 3.45* 0.50 - 1.10 mg/dL Final  10/30/2013 05:31 AM 1.86* 0.50 - 1.10 mg/dL Final  10/29/2013 12:00 PM 1.87* 0.50 - 1.10 mg/dL Final  10/28/2013 05:50 AM 1.80* 0.50 - 1.10 mg/dL Final  10/27/2013 04:06 PM 1.76* 0.50 - 1.10 mg/dL Final  10/21/2013 04:03 AM 1.63* 0.50 - 1.10 mg/dL Final  10/20/2013 07:55 AM 1.75* 0.50 - 1.10 mg/dL Final  10/19/2013 05:55 AM 1.76* 0.50 - 1.10 mg/dL Final  10/18/2013 05:00 AM 1.88* 0.50 - 1.10 mg/dL Final  10/17/2013 03:25 AM 1.71* 0.50 - 1.10 mg/dL Final  10/16/2013 03:32 PM 1.70* 0.50 - 1.10 mg/dL Final  10/16/2013 03:35 AM 1.73* 0.50 - 1.10 mg/dL Final  10/15/2013 01:18 PM 1.62* 0.50 - 1.10 mg/dL Final  07/10/2013 06:25 AM 1.88* 0.50 - 1.10 mg/dL Final  07/09/2013 06:55 AM 1.96* 0.50 - 1.10 mg/dL Final  07/08/2013 05:00 AM 1.88* 0.50 - 1.10 mg/dL Final  07/07/2013 05:00 AM 2.07* 0.50 - 1.10 mg/dL Final  07/06/2013 05:52 AM 2.03* 0.50 - 1.10 mg/dL Final  07/05/2013 04:30 AM 2.08* 0.50 - 1.10 mg/dL Final  07/04/2013 04:15 AM 2.13* 0.50 - 1.10 mg/dL Final  07/03/2013 05:00 AM 2.25*  0.50 - 1.10 mg/dL Final  07/02/2013 06:45 AM 2.07* 0.50 - 1.10 mg/dL Final  07/01/2013 06:30 AM 2.38* 0.50 - 1.10 mg/dL Final  06/30/2013 06:50 AM 2.61* 0.50 - 1.10 mg/dL Final  06/29/2013 12:30 PM 2.33* 0.50 - 1.10 mg/dL Final  06/28/2013 07:05 AM 2.13* 0.50 - 1.10 mg/dL Final  06/27/2013 05:40 AM 1.88* 0.50 - 1.10 mg/dL Final  06/26/2013 06:32 AM 1.57* 0.50 - 1.10 mg/dL Final  06/25/2013 11:29 AM 1.43* 0.50 - 1.10 mg/dL Final  03/19/2013  04:45 AM 1.69* 0.50 - 1.10 mg/dL Final  03/18/2013 05:00 AM 1.74* 0.50 - 1.10 mg/dL Final  03/17/2013 03:30 AM 1.74* 0.50 - 1.10 mg/dL Final  03/16/2013 02:56 AM 2.10* 0.50 - 1.10 mg/dL Final  03/15/2013 05:00 AM 2.48* 0.50 - 1.10 mg/dL Final  03/14/2013 04:43 AM 2.70* 0.50 - 1.10 mg/dL Final  03/13/2013 02:50 AM 2.78* 0.50 - 1.10 mg/dL Final  03/12/2013 03:52 AM 2.74* 0.50 - 1.10 mg/dL Final  03/11/2013 05:44 AM 3.08* 0.50 - 1.10 mg/dL Final  03/10/2013 05:16 AM 2.67* 0.50 - 1.10 mg/dL Final  03/09/2013 04:37 AM 1.97* 0.50 - 1.10 mg/dL Final  03/08/2013 01:35 PM 1.57* 0.50 - 1.10 mg/dL Final  03/08/2013 03:39 AM 1.50* 0.50 - 1.10 mg/dL Final  03/07/2013 04:05 AM 1.44* 0.50 - 1.10 mg/dL Final  03/06/2013 05:32 AM 1.35* 0.50 - 1.10 mg/dL Final  03/05/2013 11:50 PM 1.30* 0.50 - 1.10 mg/dL Final  08/23/2012 05:40 AM 1.31* 0.50 - 1.10 mg/dL Final  08/22/2012 06:25 PM 1.05 0.50 - 1.10 mg/dL Final  08/22/2012 09:17 AM 1.30* 0.50 - 1.10 mg/dL Final  04/10/2011 08:25 AM 0.90 0.50 - 1.10 mg/dL Final     PMHx:   Past Medical History  Diagnosis Date  . Hypertension   . Pneumonia   . CHF (congestive heart failure) 08/2012  . Anemia 08/2012  . UTI (urinary tract infection) 08/22/2012  . Ischemic cardiomyopathy     EF 30-35%  . Coronary artery disease   . Pulmonary hypertension   . NSVT (nonsustained ventricular tachycardia) 02/2013  . Diabetes mellitus     Reportedly diagnosed 2011 but no medication initiated until 04/2011 (Metformin)  . Diabetic peripheral neuropathy     Since 2013  . Thrombocytopenia   . CKD (chronic kidney disease)   . Chronic systolic CHF (congestive heart failure)     a. EF 35-40% grade II DD 02/2013. b. Previously required short-term milrinone.  Marland Kitchen Uncontrolled diabetes mellitus   . HTN (hypertension)   . Pulmonary HTN     a. Cath 02/2013: severe pulm HTN.  . CAD (coronary artery disease)     a. Cath 02/2013: severe 2V CAD - 100% mRCA with L-R collaterals; diffuse  80-95% subtotal OM2 (too diffusely diseased to attempt PCA), overall small caliber diffusely diseased coronaries c/w poorly controlled DM; treated medically.  . CKD (chronic kidney disease), stage IV   . Ischemic cardiomyopathy   . Anemia     a. Prior normal EGD 03/2013. b. Progressive anemia 06/2013 in setting of transient hematemesis that had resolved.   . Thrombocytopenia     a. Liver imaging normal 03/2013.  . Edema     a. RUE edema with neg duplex for DVT 06/2013.  Marland Kitchen Protein calorie malnutrition   . Poor social situation   . Ascites     a. s/p paracentesis 03/2013.  . Valvular heart disease     a. Echo 06/2013: mild AI, mod MR, mod TR, mod PR.  Past Surgical History  Procedure Laterality Date  . Cesarean section    . Tubal ligation    . Esophagogastroduodenoscopy N/A 03/13/2013    Procedure: ESOPHAGOGASTRODUODENOSCOPY (EGD);  Surgeon: Winfield Cunas., MD;  Location: [facility] ENDOSCOPY;  Service: Endoscopy;  Laterality: N/A;  . Cardiac catheterization  02/2013    severe 2 vessel CAD    Family Hx:  Family History  Problem Relation Age of Onset  . Diabetes Brother     deceased in 91Q dt DM complications    Social History:  reports that she has never smoked. She has never used smokeless tobacco. She reports that she does not drink alcohol or use illicit drugs.  Allergies: No Known Allergies  Medications: Prior to Admission medications   Medication Sig Start Date End Date Taking? Authorizing Provider  aspirin 81 MG EC tablet Take 1 tablet (81 mg total) by mouth daily. 04/08/13  Yes Tresa Garter, MD  atorvastatin (LIPITOR) 10 MG tablet Take 1 tablet (10 mg total) by mouth daily at 6 PM. 10/21/13  Yes Sahar Alene Mires, PA-C  carvedilol (COREG) 3.125 MG tablet Take 3.125 mg by mouth 2 (two) times daily with a meal.   Yes Historical Provider, MD  hydrALAZINE (APRESOLINE) 100 MG tablet Take 100 mg by mouth 2 (two) times daily.   Yes Historical Provider, MD  isosorbide mononitrate  (IMDUR) 60 MG 24 hr tablet Take 60 mg by mouth daily. 11/08/13  Yes Historical Provider, MD  torsemide (DEMADEX) 20 MG tablet Take 2 tablets (40 mg total) by mouth 2 (two) times daily. 10/21/13  Yes Lacy Duverney, PA-C    I have reviewed the patient's current medications.  Labs:  Results for orders placed or performed during the hospital encounter of 12/21/13 (from the past 48 hour(s))  CBC with Differential     Status: Abnormal   Collection Time: 12/21/13  8:41 PM  Result Value Ref Range   WBC 3.5 (L) 4.0 - 10.5 K/uL   RBC 2.36 (L) 3.87 - 5.11 MIL/uL   Hemoglobin 7.7 (L) 12.0 - 15.0 g/dL   HCT 23.9 (L) 36.0 - 46.0 %   MCV 101.3 (H) 78.0 - 100.0 fL   MCH 32.6 26.0 - 34.0 pg   MCHC 32.2 30.0 - 36.0 g/dL   RDW 21.6 (H) 11.5 - 15.5 %   Platelets 37 (L) 150 - 400 K/uL    Comment: REPEATED TO VERIFY PLATELET COUNT CONFIRMED BY SMEAR    Neutrophils Relative % 50 43 - 77 %   Lymphocytes Relative 10 (L) 12 - 46 %   Monocytes Relative 11 3 - 12 %   Eosinophils Relative 28 (H) 0 - 5 %   Basophils Relative 1 0 - 1 %   Neutro Abs 1.7 1.7 - 7.7 K/uL   Lymphs Abs 0.4 (L) 0.7 - 4.0 K/uL   Monocytes Absolute 0.4 0.1 - 1.0 K/uL   Eosinophils Absolute 1.0 (H) 0.0 - 0.7 K/uL   Basophils Absolute 0.0 0.0 - 0.1 K/uL   RBC Morphology ELLIPTOCYTES     Comment: BURR CELLS  Comprehensive metabolic panel     Status: Abnormal   Collection Time: 12/21/13  8:41 PM  Result Value Ref Range   Sodium 133 (L) 137 - 147 mEq/L   Potassium 5.8 (H) 3.7 - 5.3 mEq/L   Chloride 97 96 - 112 mEq/L   CO2 17 (L) 19 - 32 mEq/L   Glucose, Bld 163 (H) 70 - 99 mg/dL   BUN  128 (H) 6 - 23 mg/dL   Creatinine, Ser 3.45 (H) 0.50 - 1.10 mg/dL   Calcium 8.1 (L) 8.4 - 10.5 mg/dL   Total Protein 7.1 6.0 - 8.3 g/dL   Albumin 2.8 (L) 3.5 - 5.2 g/dL   AST 45 (H) 0 - 37 U/L   ALT 35 0 - 35 U/L   Alkaline Phosphatase 163 (H) 39 - 117 U/L   Total Bilirubin 0.8 0.3 - 1.2 mg/dL   GFR calc non Af Amer 13 (L) >90 mL/min   GFR calc Af  Amer 15 (L) >90 mL/min    Comment: (NOTE) The eGFR has been calculated using the CKD EPI equation. This calculation has not been validated in all clinical situations. eGFR's persistently <90 mL/min signify possible Chronic Kidney Disease.    Anion gap 19 (H) 5 - 15  Pro b natriuretic peptide     Status: Abnormal   Collection Time: 12/21/13  8:41 PM  Result Value Ref Range   Pro B Natriuretic peptide (BNP) 15092.0 (H) 0 - 125 pg/mL  I-Stat Troponin, ED (not at [facility])     Status: None   Collection Time: 12/21/13  8:44 PM  Result Value Ref Range   Troponin i, poc 0.03 0.00 - 0.08 ng/mL   Comment 3            Comment: Due to the release kinetics of cTnI, a negative result within the first hours of the onset of symptoms does not rule out myocardial infarction with certainty. If myocardial infarction is still suspected, repeat the test at appropriate intervals.   I-Stat CG4 Lactic Acid, ED     Status: None   Collection Time: 12/21/13  8:49 PM  Result Value Ref Range   Lactic Acid, Venous 1.78 0.5 - 2.2 mmol/L  Urinalysis, Routine w reflex microscopic     Status: None   Collection Time: 12/21/13 10:06 PM  Result Value Ref Range   Color, Urine YELLOW YELLOW   APPearance CLEAR CLEAR   Specific Gravity, Urine 1.013 1.005 - 1.030   pH 5.0 5.0 - 8.0   Glucose, UA NEGATIVE NEGATIVE mg/dL   Hgb urine dipstick NEGATIVE NEGATIVE   Bilirubin Urine NEGATIVE NEGATIVE   Ketones, ur NEGATIVE NEGATIVE mg/dL   Protein, ur NEGATIVE NEGATIVE mg/dL   Urobilinogen, UA 0.2 0.0 - 1.0 mg/dL   Nitrite NEGATIVE NEGATIVE   Leukocytes, UA NEGATIVE NEGATIVE    Comment: MICROSCOPIC NOT DONE ON URINES WITH NEGATIVE PROTEIN, BLOOD, LEUKOCYTES, NITRITE, OR GLUCOSE <1000 mg/dL.  Type and screen     Status: None   Collection Time: 12/21/13 10:55 PM  Result Value Ref Range   ABO/RH(D) B POS    Antibody Screen NEG    Sample Expiration 12/24/2013   ABO/Rh     Status: None   Collection Time: 12/21/13 10:55 PM   Result Value Ref Range   ABO/RH(D) B POS   MRSA PCR Screening     Status: None   Collection Time: 12/22/13  4:20 AM  Result Value Ref Range   MRSA by PCR NEGATIVE NEGATIVE    Comment:        The GeneXpert MRSA Assay (FDA approved for NASAL specimens only), is one component of a comprehensive MRSA colonization surveillance program. It is not intended to diagnose MRSA infection nor to guide or monitor treatment for MRSA infections.   Basic metabolic panel     Status: Abnormal   Collection Time: 12/22/13  5:25 AM  Result Value  Ref Range   Sodium 134 (L) 137 - 147 mEq/L   Potassium 5.5 (H) 3.7 - 5.3 mEq/L   Chloride 98 96 - 112 mEq/L   CO2 18 (L) 19 - 32 mEq/L   Glucose, Bld 100 (H) 70 - 99 mg/dL   BUN 128 (H) 6 - 23 mg/dL   Creatinine, Ser 3.66 (H) 0.50 - 1.10 mg/dL   Calcium 8.4 8.4 - 10.5 mg/dL   GFR calc non Af Amer 12 (L) >90 mL/min   GFR calc Af Amer 14 (L) >90 mL/min    Comment: (NOTE) The eGFR has been calculated using the CKD EPI equation. This calculation has not been validated in all clinical situations. eGFR's persistently <90 mL/min signify possible Chronic Kidney Disease.    Anion gap 18 (H) 5 - 15  CBC     Status: Abnormal   Collection Time: 12/22/13  5:25 AM  Result Value Ref Range   WBC 4.2 4.0 - 10.5 K/uL   RBC 2.21 (L) 3.87 - 5.11 MIL/uL   Hemoglobin 7.4 (L) 12.0 - 15.0 g/dL   HCT 22.3 (L) 36.0 - 46.0 %   MCV 100.9 (H) 78.0 - 100.0 fL   MCH 33.5 26.0 - 34.0 pg   MCHC 33.2 30.0 - 36.0 g/dL   RDW 21.5 (H) 11.5 - 15.5 %   Platelets 39 (L) 150 - 400 K/uL    Comment: REPEATED TO VERIFY CONSISTENT WITH PREVIOUS RESULT   Protime-INR     Status: Abnormal   Collection Time: 12/22/13  5:25 AM  Result Value Ref Range   Prothrombin Time 18.3 (H) 11.6 - 15.2 seconds   INR 1.51 (H) 0.00 - 1.49  Troponin I     Status: None   Collection Time: 12/22/13  5:25 AM  Result Value Ref Range   Troponin I <0.30 <0.30 ng/mL    Comment:        Due to the release  kinetics of cTnI, a negative result within the first hours of the onset of symptoms does not rule out myocardial infarction with certainty. If myocardial infarction is still suspected, repeat the test at appropriate intervals.   TSH     Status: Abnormal   Collection Time: 12/22/13  5:25 AM  Result Value Ref Range   TSH 6.880 (H) 0.350 - 4.500 uIU/mL  Glucose, capillary     Status: None   Collection Time: 12/22/13  8:43 AM  Result Value Ref Range   Glucose-Capillary 90 70 - 99 mg/dL  Troponin I     Status: None   Collection Time: 12/22/13 10:10 AM  Result Value Ref Range   Troponin I <0.30 <0.30 ng/mL    Comment:        Due to the release kinetics of cTnI, a negative result within the first hours of the onset of symptoms does not rule out myocardial infarction with certainty. If myocardial infarction is still suspected, repeat the test at appropriate intervals.      ROS:  A comprehensive review of systems was negative except for: Cardiovascular: positive for dyspnea, lower extremity edema and orthopnea she also complains of this diffuse rash and itching everywhere. The remainder of her review systems is negative  Physical Exam: Filed Vitals:   12/22/13 0846  BP: 122/47  Pulse: 57  Temp: 97.8 F (36.6 C)  Resp:      General: small in stature [name] who is resting in bed. She has a diffuse scaly skin change of her face.  She is resting comfortably HEENT: pupils are equal round reactive to light, sugar motions are intact, mucous members are moist Neck: there is no JVD appreciated Heart: slightly bradycardic without murmur Lungs: decreased breath sounds at the bases Abdomen: slightly distended, nontender Extremities: pitting edema to dependent areas Skin: diffuse rash which appears to be old. There's a scaly texture to her skin and it is not erythematous. Neuro: somnolent but alert and talkative to her son  Assessment/Plan: 80 year old [name] with  past medical history mostly significant for ischemic cardiomyopathy as well as advanced myelodysplastic syndrome. She has a baseline CK D with a creatinine of 1.8 in September but now presents with volume overload and a creatinine in the threes 1.Renal- acute on chronic kidney disease. Her urinalysis is bland. I cannot detect any exposure to nephrotoxic medications. If anything I am concerned that may be her blood pressure was low hence the decrease of her blood pressure medications and possibly renal hypoperfusion causing this change.  I am going to order renal imaging to rule out obstruction. I am also going to stop her hydralazine. I'm going to increase furosemide to try to achieve diuresis which is what she needs.  I am hoping since change in renal function has taken place in such a short amount of time that reversibility will be possible. I told the son this. But I have also told him that she is very close to the threshold of requiring dialysis. Dialysis would be pretty difficult in this female with ischemic cardiomyopathy as well as the advanced myelodysplastic syndrome. I attempted to relay that to the son. 2. Hypertension/volume  - she is volume overloaded at this time. We'll increase Lasix to fairly maximal IV doses and noted to try to achieve diuresis.  3. Anemia  - myelodysplastic syndrome with basically pancytopenia. Not sure she would even respond to ESA. Has been treated with transfusions as an outpatient. 4. Itching/rash- patient's with advanced CK D he can get itching. It doesn't usually lead to a rash of this severity. I tend to think there may be something in addition going on. We'll use Atarax when necessary and Sarna lotion  Thank you for this consult, we will follow with you  Maudell Stanbrough A 12/22/2013, 12:14 PM

## 2013-12-22 NOTE — Plan of Care (Signed)
Problem: Consults Goal: Skin Care Protocol Initiated - if Braden Score 18 or less If consults are not indicated, leave blank or document N/A  Outcome: Progressing Goal: Tobacco Cessation referral if indicated Outcome: Not Applicable Date Met:  14/64/31  Problem: Phase I Progression Outcomes Goal: Pain controlled with appropriate interventions Outcome: Completed/Met Date Met:  12/22/13 Goal: Voiding-avoid urinary catheter unless indicated Outcome: Progressing

## 2013-12-22 NOTE — ED Notes (Addendum)
Pt states that she speaks [place].

## 2013-12-22 NOTE — Progress Notes (Signed)
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 On admission, patient was found to be in acute heart failure with much of her volume overload presenting as ascites-similar to past admissions.  She was also noted to have a creatinine of 3.45 and hypothermic requiring Customer service manager.  Patient placed in stepdown unit and started on IV Lasix.  Noted on admission to have diffuse rash with secondary itching  Patient seen this morning. Has had minimal response to Lasix.  Assessment/Plan: Principal Problem:  Acute on chronic combined systolic and diastolic congestive heart failure in the setting of ischemic cardio myopathy and presenting as ascites:  On IV Lasix. Appreciate nephrology and cardiology assistance. Plan is to start IV milrinone. Cardiology will place line tomorrow. In regards to ascites, patient will need paracentesis. This may be limited due to thrombocytopenia, but we'll discuss with interventional radiology. A platelet transfusion may do little to improve her thrombocytopenia.    Essential hypertension: episodes of hypotension, hydralazine discontinued    Hyperlipidemia: On statin    Uncontrolled type 2 diabetes mellitus with peripheral neuropathy: Stable for now, CBGs under 200, likely in the setting of poor by mouth intake   Protein calorie malnutrition: Will ask nutrition to assess and make recommendations    Acute renal failure syndrome in the setting of chronic kidney disease stage IV: appreciate nephrology and cardiology assistance.   Hydralazine stopped and renal imaging ordered to rule out obstruction. Patient likely not candidate for dialysis.    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  We'll discuss with oncology if platelet transfusion improve her counts enough to better support paracentesis. In review of office records, no aggressive chemotherapy regimen planned in regards to MDS  Exfoliative dermatitis: This could be secondary to advanced chronic kidney disease or possibly even MDS. Suspect less likely drug rash. Topical lotion for now  Not convinced at this time that she has an infectious process going on. We'll discontinue all antibiotics   Code Status: full code  Family Communication: left message with son  Disposition Plan: continue in stepdown. Likely here for some time, she has severe chronic medical issues with acute worsening, she may not survive this admission   Consultants:  Interventional radiology  Cardiology  Nephrology  Procedures:  none  Antibiotics:  Vancomycin/Zosyn 11/14-11/15   Objective: BP 130/45 mmHg  Pulse 58  Temp(Src) 98 F (36.7 C) (Oral)  Resp 10  Ht 5' (1.524 m)  Wt 62.7 kg (138 lb 3.7 oz)  BMI 27.00 kg/m2  SpO2 99%  Intake/Output Summary (Last 24 hours) at 12/22/13 1359 Last data filed at 12/22/13 1244  Gross per 24 hour  Intake     65 ml  Output    450 ml  Net   -385 ml   Filed Weights   12/21/13 2100 12/22/13 0413  Weight: 48.988 kg (108 lb) 62.7 kg (138 lb 3.7 oz)    Exam:   General:  Drowsy, no acute distress  Skin: Generalized exfoliative dermatitis with dry skin  Cardiovascular: regular rate and rhythm, T7-D2, 2/6 systolic ejection murmur, borderline bradycardia  Respiratory: decreased breath sounds bibasilar  Abdomen: soft, distended, nontender, hypoactive bowel sounds  Musculoskeletal: no clubbing or cyanosis, +1 pitting edema bilaterally   Data  Reviewed: Basic Metabolic Panel:  Recent Labs Lab 12/21/13 2041 12/22/13 0525  NA 133* 134*  K 5.8* 5.5*  CL 97 98  CO2 17* 18*  GLUCOSE 163* 100*  BUN 128* 128*  CREATININE 3.45* 3.66*  CALCIUM 8.1* 8.4   Liver Function Tests:  Recent Labs Lab 12/21/13 2041  AST 45*  ALT 35  ALKPHOS 163*  BILITOT 0.8  PROT 7.1  ALBUMIN 2.8*   No results for input(s): LIPASE, AMYLASE in the last 168 hours. No results for input(s): AMMONIA in the last 168 hours. CBC:  Recent Labs Lab 12/21/13 2041 12/22/13 0525  WBC 3.5* 4.2  NEUTROABS 1.7  --   HGB 7.7* 7.4*  HCT 23.9* 22.3*  MCV 101.3* 100.9*  PLT 37* 39*   Cardiac Enzymes:    Recent Labs Lab 12/22/13 0525 12/22/13 1010  TROPONINI <0.30 <0.30   BNP (last 3 results)  Recent Labs  10/27/13 1606 10/30/13 0531 12/21/13 2041  PROBNP 16497.0* 12300.0* 15092.0*   CBG:  Recent Labs Lab 12/22/13 0843 12/22/13 1238  GLUCAP 90 83    Recent Results (from the past 240 hour(s))  MRSA PCR Screening     Status: None   Collection Time: 12/22/13  4:20 AM  Result Value Ref Range Status   MRSA by PCR NEGATIVE NEGATIVE Final    Comment:        The GeneXpert MRSA Assay (FDA approved for NASAL specimens only), is one component of a comprehensive MRSA colonization surveillance program. It is not intended to diagnose MRSA infection nor to guide or monitor treatment for MRSA infections.      Studies: Dg Chest Port 1 View  12/21/2013   CLINICAL DATA:  Shortness of breath  EXAM: PORTABLE CHEST - 1 VIEW  COMPARISON:  10/30/2013  FINDINGS: There is tenting of the diaphragm. There is bilateral mild interstitial thickening. There is no focal parenchymal opacity, pleural effusion, or pneumothorax. There is stable cardiomegaly. There is thoracic aortic atherosclerosis.  The osseous structures are unremarkable.  IMPRESSION: Cardiomegaly with pulmonary vascular congestion.   Electronically Signed   By: Kathreen Devoid   On:  12/21/2013 21:23    Scheduled Meds: . antiseptic oral rinse  7 mL Mouth Rinse BID  . aspirin EC  81 mg Oral Daily  . atorvastatin  10 mg Oral q1800  . furosemide  160 mg Intravenous Q6H  . insulin aspart  0-9 Units Subcutaneous TID WC  . isosorbide mononitrate  60 mg Oral Daily  . lubriderm seriously sensitive   Topical BID  . piperacillin-tazobactam (ZOSYN)  IV  2.25 g Intravenous 3 times per day  . sodium chloride  3 mL Intravenous Q12H  . sodium chloride  3 mL Intravenous Q12H  . [START ON 12/23/2013] vancomycin  500 mg Intravenous Q48H    Continuous Infusions: . milrinone       Time spent: 35 minutes  [facility] Pager [phone]. If 7PM-7AM, please contact night-coverage at [email or web address], password [facility] 12/22/2013, 1:59 PM  LOS: 1 day

## 2013-12-23 ENCOUNTER — Inpatient Hospital Stay (HOSPITAL_COMMUNITY): Payer: Medicaid Other

## 2013-12-23 LAB — BASIC METABOLIC PANEL
Anion gap: 18 — ABNORMAL HIGH (ref 5–15)
BUN: 123 mg/dL — ABNORMAL HIGH (ref 6–23)
CO2: 19 mEq/L (ref 19–32)
Calcium: 8.2 mg/dL — ABNORMAL LOW (ref 8.4–10.5)
Chloride: 101 mEq/L (ref 96–112)
Creatinine, Ser: 3.78 mg/dL — ABNORMAL HIGH (ref 0.50–1.10)
GFR calc Af Amer: 14 mL/min — ABNORMAL LOW (ref >90)
GFR calc non Af Amer: 12 mL/min — ABNORMAL LOW (ref >90)
GLUCOSE: 123 mg/dL — AB (ref 70–99)
Potassium: 4.9 mEq/L (ref 3.7–5.3)
Sodium: 138 mEq/L (ref 137–147)

## 2013-12-23 LAB — GLUCOSE, CAPILLARY
Glucose-Capillary: 92 mg/dL (ref 70–99)
Glucose-Capillary: 133 mg/dL — ABNORMAL HIGH (ref 70–99)
Glucose-Capillary: 145 mg/dL — ABNORMAL HIGH (ref 70–99)
Glucose-Capillary: 110 mg/dL — ABNORMAL HIGH (ref 70–99)

## 2013-12-23 LAB — CBC
HCT: 21 % — ABNORMAL LOW (ref 36.0–46.0)
HEMOGLOBIN: 7 g/dL — AB (ref 12.0–15.0)
MCH: 33.7 pg (ref 26.0–34.0)
MCHC: 33.3 g/dL (ref 30.0–36.0)
MCV: 101 fL — ABNORMAL HIGH (ref 78.0–100.0)
Platelets: 41 10*3/uL — ABNORMAL LOW (ref 150–400)
RBC: 2.08 MIL/uL — ABNORMAL LOW (ref 3.87–5.11)
RDW: 21.8 % — ABNORMAL HIGH (ref 11.5–15.5)
WBC: 3.8 10*3/uL — ABNORMAL LOW (ref 4.0–10.5)

## 2013-12-23 LAB — CLOSTRIDIUM DIFFICILE BY PCR: Toxigenic C. Difficile by PCR: NEGATIVE

## 2013-12-23 MED ORDER — GLUCERNA SHAKE PO LIQD
237.0000 mL | Freq: Every day | ORAL | Status: DC
  Administered 2013-12-24 – 2014-01-10 (×16): 237 mL via ORAL

## 2013-12-23 MED ORDER — LIDOCAINE HCL (PF) 1 % IJ SOLN
INTRAMUSCULAR | Status: AC
  Filled 2013-12-23: qty 10

## 2013-12-23 NOTE — Progress Notes (Signed)
Advanced Heart Failure Rounding Note   Subjective:    63 yo with history of CAD and ischemic cardiomyopathy, chronic CHF with predominantly pulmonary venous hypertension, GI bleed, and CKD . Admitted with volume overload.   Renal following. Currently diuresing sluggishly with lasix 160 mg IV every 6 hours + milrinone 0.125 mcg . Weight unchanged. C/o ab bloating. Denies dyspnea.     Objective:   Weight Range:  Vital Signs:   Temp:  [96.1 F (35.6 C)-98 F (36.7 C)] 97.4 F (36.3 C) (11/16 0840) Pulse Rate:  [58-65] 65 (11/16 0429) Resp:  [10-16] 16 (11/16 0024) BP: (118-138)/(42-76) 118/53 mmHg (11/16 0840) SpO2:  [98 %-100 %] 98 % (11/16 0429) Weight:  [138 lb 14.2 oz (63 kg)] 138 lb 14.2 oz (63 kg) (11/16 0429) Last BM Date: 12/23/13  Weight change: Filed Weights   12/21/13 2100 12/22/13 0413 12/23/13 0429  Weight: 108 lb (48.988 kg) 138 lb 3.7 oz (62.7 kg) 138 lb 14.2 oz (63 kg)    Intake/Output:   Intake/Output Summary (Last 24 hours) at 12/23/13 0930 Last data filed at 12/23/13 0900  Gross per 24 hour  Intake 531.68 ml  Output   1151 ml  Net -619.32 ml     Physical Exam: General:  Chronically ill appearing. No resp difficulty HEENT: normal diffuse scaly rash Neck: supple. JVP to jaw . Carotids 2+ bilat; no bruits. No lymphadenopathy or thryomegaly appreciated. Cor: PMI nondisplaced. Regular rate & rhythm. No rubs, gallops or murmurs. Lungs: clear Abdomen: soft, nontender, ++distended. No hepatosplenomegaly. No bruits or masses. Good bowel sounds. Extremities: no cyanosis, clubbing, rash, edema RUE swelling infiltrate Neuro: alert & orientedx3, cranial nerves grossly intact. moves all 4 extremities w/o difficulty. Affect pleasant Skin: Dry scales all over her body  Telemetry: SR   Labs: Basic Metabolic Panel:  Recent Labs Lab 12/21/13 2041 12/22/13 0525 12/23/13 0325  NA 133* 134* 138  K 5.8* 5.5* 4.9  CL 97 98 101  CO2 17* 18* 19  GLUCOSE 163*  100* 123*  BUN 128* 128* 123*  CREATININE 3.45* 3.66* 3.78*  CALCIUM 8.1* 8.4 8.2*    Liver Function Tests:  Recent Labs Lab 12/21/13 2041  AST 45*  ALT 35  ALKPHOS 163*  BILITOT 0.8  PROT 7.1  ALBUMIN 2.8*   No results for input(s): LIPASE, AMYLASE in the last 168 hours. No results for input(s): AMMONIA in the last 168 hours.  CBC:  Recent Labs Lab 12/21/13 2041 12/22/13 0525 12/23/13 0325  WBC 3.5* 4.2 3.8*  NEUTROABS 1.7  --   --   HGB 7.7* 7.4* 7.0*  HCT 23.9* 22.3* 21.0*  MCV 101.3* 100.9* 101.0*  PLT 37* 39* 41*    Cardiac Enzymes:  Recent Labs Lab 12/22/13 0525 12/22/13 1010 12/22/13 1610  TROPONINI <0.30 <0.30 <0.30    BNP: BNP (last 3 results)  Recent Labs  10/27/13 1606 10/30/13 0531 12/21/13 2041  PROBNP 16497.0* 12300.0* 15092.0*     Other results:  EKG:   Imaging: US Renal  12/23/2013   CLINICAL DATA:  63 year old female with acute renal injury. Initial encounter.  EXAM: RENAL/URINARY TRACT ULTRASOUND COMPLETE  COMPARISON:  Abdomen MRI 03/12/2013 and earlier.  FINDINGS: Right Kidney:  Length: 7.9 cm.  Echogenic cortex.  No hydronephrosis or renal mass.  Left Kidney:  Length: 10.6 cm. No hydronephrosis. Cortical echogenicity not as pronounced as that on the right. Small simple appearing cortical cysts measure up to 12 mm and are stable.  Bladder:  Not visualized, reportedly a Foley catheter is in place.  Other findings: Small volume ascites is new. Right pleural effusion, which was on prior studies.  IMPRESSION: 1. Chronic medical renal disease perhaps greater on the right. No acute renal findings. 2. New small volume ascites.  Chronic right pleural effusion.   Electronically Signed   By: Lars Pinks M.D.   On: 12/23/2013 08:55   Dg Chest Port 1 View  12/21/2013   CLINICAL DATA:  Shortness of breath  EXAM: PORTABLE CHEST - 1 VIEW  COMPARISON:  10/30/2013  FINDINGS: There is tenting of the diaphragm. There is bilateral mild interstitial  thickening. There is no focal parenchymal opacity, pleural effusion, or pneumothorax. There is stable cardiomegaly. There is thoracic aortic atherosclerosis.  The osseous structures are unremarkable.  IMPRESSION: Cardiomegaly with pulmonary vascular congestion.   Electronically Signed   By: Kathreen Devoid   On: 12/21/2013 21:23      Medications:     Scheduled Medications: . antiseptic oral rinse  7 mL Mouth Rinse BID  . aspirin EC  81 mg Oral Daily  . atorvastatin  10 mg Oral q1800  . furosemide  160 mg Intravenous Q6H  . insulin aspart  0-9 Units Subcutaneous TID WC  . isosorbide mononitrate  60 mg Oral Daily  . lubriderm seriously sensitive   Topical BID  . sodium chloride  3 mL Intravenous Q12H  . sodium chloride  3 mL Intravenous Q12H     Infusions: . milrinone 0.125 mcg/kg/min (12/22/13 1503)     PRN Medications:  sodium chloride, acetaminophen **OR** acetaminophen, camphor-menthol, hydrOXYzine, ondansetron **OR** ondansetron (ZOFRAN) IV, oxyCODONE, sodium chloride, zolpidem   Assessment:  1. A/C Systolic Heart Failure. ICM EF 30-35% with cardiorenal syndrome 2. Acute on chronic renal failure Stage IV 3. DM2 4. H/O Gout 5. H/O GI bleed 10/2013  6. Diffuse scaly rash   Plan/Discussion:    Renal function continues to worsen despite high dose lasix + milrinone. Diuresis sluggish.  Place central line to guide diuresis.   Plan for paracentesis today.    Length of Stay: 2   CLEGG,AMY NP-C  12/23/2013, 9:30 AM  Advanced Heart Failure Team Pager [phone] (M-F; [place])  Please contact [facility] for night-coverage after hours (4p -7a ) and weekends on [email or web address]  Patient seen and examined with Darrick Grinder, NP. We discussed all aspects of the encounter. I agree with the assessment and plan as stated above.   She has significant volume overload with cardiorenal syndrome and renal failure. Diuresis very sluggish. Will continue milrinone and high dose IV lasix.  Place central line to follow CVPs and co-ox. Paracentesis today. Rash is concerning. Dr. Posey Pronto has questioned iccthyosis due to underlying MDS? Will check ANA and ANCA. She is not a good candidate for chemo or HD.   Cung Masterson,MD 10:38 AM

## 2013-12-23 NOTE — Plan of Care (Signed)
Problem: Phase II Progression Outcomes Goal: Pain controlled Outcome: Completed/Met Date Met:  12/23/13 Goal: Dyspnea controlled with activity Outcome: Progressing Goal: Tolerating diet Outcome: Progressing

## 2013-12-23 NOTE — Progress Notes (Signed)
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 On admission, patient was found to be in acute heart failure with much of her volume overload presenting as ascites-similar to past admissions.  She was also noted to have a creatinine of 3.45 and hypothermic requiring Customer service manager.  Patient placed in stepdown unit and started on IV Lasix.  Noted on admission to have diffuse rash with secondary itching. Initially there was concern about sepsis and antibiotics started, discontinued the following morning as no immediate signs of infection noted  today, patient still not responding very well to Lasix. Seen by nephrology and cardiology. Creatinine worse today. Through son, patient declined central line needed for IV milrinone.  Remains hypothermic requiring Retail banker.  Patient herself comfortable and in no acute distress.  Assessment/Plan: Principal Problem:  Acute on chronic combined systolic and diastolic congestive heart failure in the setting of ischemic cardio myopathy and presenting as ascites:  On IV Lasix. Appreciate nephrology and cardiology assistance. Plan is to start IV milrinone, however patient discussed with her son and she declines a central line. Not felt to be a dialysis candidate. Interventional radiology to see for therapeutic paracentesis.  Have asked palliative care to see patient.    Essential hypertension: episodes of hypotension, hydralazine discontinued    Hyperlipidemia: On statin    Uncontrolled type 2 diabetes mellitus with  peripheral neuropathy: Stable for now, CBGs under 200, likely in the setting of poor by mouth intake    Protein calorie malnutrition: Will ask nutrition to assess and make recommendations    Acute renal failure syndrome in the setting of chronic kidney disease stage IV: appreciate nephrology and cardiology assistance.  Hydralazine stopped.  Renal ultrasound only notes chronic renal disease and no signs of obstruction. Patient likely not candidate for dialysis.    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  Platelet count somewhat better this morning. No transfusion.. In review of office records, no aggressive chemotherapy regimen planned in regards to MDS  Exfoliative dermatitis: This could be secondary to advanced chronic kidney disease or possibly even MDS. Suspect less likely drug rash. Topical lotion for now  Not convinced at this time that she has an infectious process going on. We'll discontinue all antibiotics   Code Status: full code  Family Communication: left message with son  Disposition Plan: continue in stepdown. Need to have goals of care to figure out end point  Consultants:  Interventional radiology  Cardiology  Nephrology  Palliative care  Procedures:  none  Antibiotics:  Vancomycin/Zosyn 11/14-11/15   Objective: BP 118/53 mmHg  Pulse 65  Temp(Src) 97.4 F (36.3 C) (Oral)  Resp 16  Ht 5' (1.524 m)  Wt 63 kg (138 lb 14.2 oz)  BMI 27.13 kg/m2  SpO2 98%  Intake/Output Summary (Last 24 hours) at 12/23/13 1238 Last data filed at 12/23/13 0955  Gross per 24 hour  Intake 894.08 ml  Output   1150 ml  Net -255.92 ml   Filed Weights   12/21/13 2100 12/22/13 0413 12/23/13 0429  Weight: 48.988 kg (108 lb) 62.7 kg (138 lb 3.7 oz) 63 kg (  138 lb 14.2 oz)    Exam:   General:  Alert, interactive, no acute distress  Skin: Generalized exfoliative dermatitis with dry  skin  Cardiovascular: regular rate and rhythm, O0-H2, 2/6 systolic ejection murmur  Respiratory: decreased breath sounds bibasilar  Abdomen: soft, distended, nontender, hypoactive bowel sounds  Musculoskeletal: no clubbing or cyanosis, +1 pitting edema bilaterally   Data Reviewed: Basic Metabolic Panel:  Recent Labs Lab 12/21/13 2041 12/22/13 0525 12/23/13 0325  NA 133* 134* 138  K 5.8* 5.5* 4.9  CL 97 98 101  CO2 17* 18* 19  GLUCOSE 163* 100* 123*  BUN 128* 128* 123*  CREATININE 3.45* 3.66* 3.78*  CALCIUM 8.1* 8.4 8.2*   Liver Function Tests:  Recent Labs Lab 12/21/13 2041  AST 45*  ALT 35  ALKPHOS 163*  BILITOT 0.8  PROT 7.1  ALBUMIN 2.8*   No results for input(s): LIPASE, AMYLASE in the last 168 hours. No results for input(s): AMMONIA in the last 168 hours. CBC:  Recent Labs Lab 12/21/13 2041 12/22/13 0525 12/23/13 0325  WBC 3.5* 4.2 3.8*  NEUTROABS 1.7  --   --   HGB 7.7* 7.4* 7.0*  HCT 23.9* 22.3* 21.0*  MCV 101.3* 100.9* 101.0*  PLT 37* 39* 41*   Cardiac Enzymes:    Recent Labs Lab 12/22/13 0525 12/22/13 1010 12/22/13 1610  TROPONINI <0.30 <0.30 <0.30   BNP (last 3 results)  Recent Labs  10/27/13 1606 10/30/13 0531 12/21/13 2041  PROBNP 16497.0* 12300.0* 15092.0*   CBG:  Recent Labs Lab 12/22/13 0843 12/22/13 1238 12/22/13 1627 12/23/13 0848  GLUCAP 90 83 141* 92    Recent Results (from the past 240 hour(s))  Blood culture (routine x 2)     Status: None (Preliminary result)   Collection Time: 12/21/13  9:24 PM  Result Value Ref Range Status   Specimen Description BLOOD RIGHT ARM  Final   Special Requests BOTTLES DRAWN AEROBIC AND ANAEROBIC 6CC EA  Final   Culture  Setup Time   Final    12/22/2013 19:28 Performed at [facility]    Culture   Final           BLOOD CULTURE RECEIVED NO GROWTH TO DATE CULTURE WILL BE HELD FOR 5 DAYS BEFORE ISSUING A FINAL NEGATIVE REPORT Performed at [facility]     Report Status PENDING  Incomplete  Blood culture (routine x 2)     Status: None (Preliminary result)   Collection Time: 12/21/13  9:26 PM  Result Value Ref Range Status   Specimen Description BLOOD LEFT HAND  Final   Special Requests BOTTLES DRAWN AEROBIC ONLY 10CC  Final   Culture  Setup Time   Final    12/22/2013 19:27 Performed at [facility]    Culture   Final           BLOOD CULTURE RECEIVED NO GROWTH TO DATE CULTURE WILL BE HELD FOR 5 DAYS BEFORE ISSUING A FINAL NEGATIVE REPORT Performed at [facility]    Report Status PENDING  Incomplete  MRSA PCR Screening     Status: None   Collection Time: 12/22/13  4:20 AM  Result Value Ref Range Status   MRSA by PCR NEGATIVE NEGATIVE Final    Comment:        The GeneXpert MRSA Assay (FDA approved for NASAL specimens only), is one component of a comprehensive MRSA colonization surveillance program. It is not intended to diagnose MRSA infection nor to guide or  monitor treatment for MRSA infections.   Clostridium Difficile by PCR     Status: None   Collection Time: 12/23/13  1:59 AM  Result Value Ref Range Status   C difficile by pcr NEGATIVE NEGATIVE Final     Studies: No results found.  Scheduled Meds: . antiseptic oral rinse  7 mL Mouth Rinse BID  . aspirin EC  81 mg Oral Daily  . atorvastatin  10 mg Oral q1800  . furosemide  160 mg Intravenous Q6H  . insulin aspart  0-9 Units Subcutaneous TID WC  . isosorbide mononitrate  60 mg Oral Daily  . lubriderm seriously sensitive   Topical BID  . sodium chloride  3 mL Intravenous Q12H  . sodium chloride  3 mL Intravenous Q12H    Continuous Infusions: . milrinone 0.125 mcg/kg/min (12/22/13 1503)     Time spent: 25 minutes  [facility] Pager [phone]. If 7PM-7AM, please contact night-coverage at [email or web address], password [facility] 12/23/2013, 12:38 PM  LOS: 2 days

## 2013-12-23 NOTE — Progress Notes (Signed)
Pharmacist Heart Failure Core Measure Documentation  Assessment: [name] [name] has an EF documented as 30-35% on 10/16/13 by Dr.Croitoru .  Rationale: Heart failure patients with left ventricular systolic dysfunction (LVSD) and an EF < 40% should be prescribed an angiotensin converting enzyme inhibitor (ACEI) or angiotensin receptor blocker (ARB) at discharge unless a contraindication is documented in the medical record.  This patient is not currently on an ACEI or ARB for HF.  This note is being placed in the record in order to provide documentation that a contraindication to the use of these agents is present for this encounter.  ACE Inhibitor or Angiotensin Receptor Blocker is contraindicated (specify all that apply)  []   ACEI allergy AND ARB allergy []   Angioedema []   Moderate or severe aortic stenosis []   Hyperkalemia []   Hypotension []   Renal artery stenosis [x]   Worsening renal function, preexisting renal disease or dysfunction   Kolby Myung 12/23/2013 3:06 PM

## 2013-12-23 NOTE — Progress Notes (Signed)
Text page to Dr [place] Pink - labs Hgb 7.0/ Hct 21.0 ordered type and screen. This has already been ordered on 11/14 so still good.

## 2013-12-23 NOTE — Progress Notes (Signed)
[name] [name] son was contacted again and was able to speak with his mother([name] [name]). [name] [name] son expressed that his mother does not want to have a central line placed. MD contacted to be made aware. Spoke with NP Amy.

## 2013-12-23 NOTE — Procedures (Signed)
Successful US guided paracentesis from RLQ.  Yielded 1.1L of bloody ascitic fluid.  No immediate complications.  Pt tolerated well.   Specimen was not sent for labs.  Ascencion Dike PA-C 12/23/2013 3:59 PM

## 2013-12-23 NOTE — Progress Notes (Signed)
Patient ID: [name] [name], female   DOB: 1950/08/26, 63 y.o.   MRN: [id number]  [name] [name] Progress Note    Assessment/ Plan:   1. Acute renal failure on chronic kidney disease stage 4. Suspected to be hemodynamically mediated acute renal failure with transient hypotension in this woman with congestive heart failure/mitral dysplastic syndrome. Urine output somewhat better on furosemide with slight rise of creatinine and improvement of potassium. No acute dialysis needs noted at this time-no evidence of uremic symptoms/critical electrolyte abnormality or volume compromise. She is still volume overloaded but hopefully remaining responsive to furosemide at the current dose. She is a very marginal candidate for long-term hemodialysis given her history of congestive heart failure/myelodysplastic syndrome and a palliative care approach would probably give her the best quality of life. 2. Hypertension/volume - she is volume overloaded at this time. Blood pressures fairly controlled-tolerating current dose of furosemide with improving urine output  3. Anemia - myelodysplastic syndrome with pancytopenia. Transfusions as needed for anemia. 4. Itching/rash- with global ichthyosis-symptomatic relief with Sarna/hydroxyzine  I will attempt to contact her son by phone to update him of his mother's current medical situation.  Subjective:   No acute events overnight. Patient reports that she is not in pain (except for right arm where she has had a skin tear).   Objective:   BP 118/53 mmHg  Pulse 65  Temp(Src) 97.4 F (36.3 C) (Oral)  Resp 16  Ht 5' (1.524 m)  Wt 63 kg (138 lb 14.2 oz)  BMI 27.13 kg/m2  SpO2 98%  Intake/Output Summary (Last 24 hours) at 12/23/13 0908 Last data filed at 12/23/13 0024  Gross per 24 hour  Intake 297.68 ml  Output    751 ml  Net -453.32 ml   Weight change: 14.012 kg (30 lb 14.2 oz)  Physical Exam: OVF:IEPPIRJJOAC resting in bed ZYS:AYTKZSWFU regular  rate and rhythm, S1 and S2 normal Resp:fine bibasal rales-no rhonchi XNA:TFTD, obese, nontender, bowel sounds normal Ext:1+ dependent edema over back  Imaging: US Renal  12/23/2013   CLINICAL DATA:  63 year old female with acute renal injury. Initial encounter.  EXAM: RENAL/URINARY TRACT ULTRASOUND COMPLETE  COMPARISON:  Abdomen MRI 03/12/2013 and earlier.  FINDINGS: Right Kidney:  Length: 7.9 cm.  Echogenic cortex.  No hydronephrosis or renal mass.  Left Kidney:  Length: 10.6 cm. No hydronephrosis. Cortical echogenicity not as pronounced as that on the right. Small simple appearing cortical cysts measure up to 12 mm and are stable.  Bladder:  Not visualized, reportedly a Foley catheter is in place.  Other findings: Small volume ascites is new. Right pleural effusion, which was on prior studies.  IMPRESSION: 1. Chronic medical renal disease perhaps greater on the right. No acute renal findings. 2. New small volume ascites.  Chronic right pleural effusion.   Electronically Signed   By: Lars Pinks M.D.   On: 12/23/2013 08:55   Dg Chest Port 1 View  12/21/2013   CLINICAL DATA:  Shortness of breath  EXAM: PORTABLE CHEST - 1 VIEW  COMPARISON:  10/30/2013  FINDINGS: There is tenting of the diaphragm. There is bilateral mild interstitial thickening. There is no focal parenchymal opacity, pleural effusion, or pneumothorax. There is stable cardiomegaly. There is thoracic aortic atherosclerosis.  The osseous structures are unremarkable.  IMPRESSION: Cardiomegaly with pulmonary vascular congestion.   Electronically Signed   By: Kathreen Devoid   On: 12/21/2013 21:23    Labs: BMET  Recent Labs Lab 12/21/13 2041 12/22/13 3220 12/23/13 2542  NA 133* 134* 138  K 5.8* 5.5* 4.9  CL 97 98 101  CO2 17* 18* 19  GLUCOSE 163* 100* 123*  BUN 128* 128* 123*  CREATININE 3.45* 3.66* 3.78*  CALCIUM 8.1* 8.4 8.2*   CBC  Recent Labs Lab 12/21/13 2041 12/22/13 0525 12/23/13 0325  WBC 3.5* 4.2 3.8*  NEUTROABS  1.7  --   --   HGB 7.7* 7.4* 7.0*  HCT 23.9* 22.3* 21.0*  MCV 101.3* 100.9* 101.0*  PLT 37* 39* 41*    Medications:    . antiseptic oral rinse  7 mL Mouth Rinse BID  . aspirin EC  81 mg Oral Daily  . atorvastatin  10 mg Oral q1800  . furosemide  160 mg Intravenous Q6H  . insulin aspart  0-9 Units Subcutaneous TID WC  . isosorbide mononitrate  60 mg Oral Daily  . lubriderm seriously sensitive   Topical BID  . sodium chloride  3 mL Intravenous Q12H  . sodium chloride  3 mL Intravenous Q12H  Elmarie Shiley, MD 12/23/2013, 9:08 AM

## 2013-12-23 NOTE — Progress Notes (Signed)
RN spoke with son of Pt to help obtain consent for central line. Son says he will call back in an hour to speak with Pt and discuss central line placement.

## 2013-12-23 NOTE — Progress Notes (Signed)
INITIAL NUTRITION ASSESSMENT  DOCUMENTATION CODES Per approved criteria  -Not Applicable   INTERVENTION: Glucerna Shake po daily, each supplement provides 220 kcal and 10 grams of protein RD to follow for nutrition care plan  NUTRITION DIAGNOSIS: Inadequate oral intake related to decreased appetite as evidenced by PO intake 25-50%  Goal: Pt to meet >/= 90% of their estimated nutrition needs   Monitor:  PO & supplemental intake, weight, labs, I/O's  Reason for Assessment: Consult   63 y.o. female  Admitting Dx: Acute on chronic combined systolic and diastolic congestive heart failure  ASSESSMENT: 63 year old [name] with PMH of DM, ischemic cardiomyopathy-medically managed, systolic/diastolic heart failure, stage IV CKD; admitted for worsening shortness of breath times several days.   RD unable to obtain nutrition hx from patient or complete Nutrition Focused Physical Exam at this time.  Pt sleeping, unable to wake.  Pt seen per Clinical Nutrition during previous hospital admissions.  + hx of poor appetite.  Current PO intake 25-50% per flowsheet records.  Had been receiving Glucerna Shakes with most recent admission.  RD to order during this hospitalization.   Palliative Care Team consult pending.  Height: Ht Readings from Last 1 Encounters:  12/22/13 5' (1.524 m)    Weight: Wt Readings from Last 1 Encounters:  12/23/13 138 lb 14.2 oz (63 kg)    Ideal Body Weight: 100 lb  % Ideal Body Weight: 138%  Wt Readings from Last 10 Encounters:  12/23/13 138 lb 14.2 oz (63 kg)  11/22/13 108 lb 4.8 oz (49.125 kg)  11/12/13 107 lb 9.6 oz (48.807 kg)  10/31/13 115 lb (52.164 kg)  10/29/13 115 lb 14.4 oz (52.572 kg)  10/21/13 114 lb 13.8 oz (52.1 kg)  07/10/13 115 lb 8.3 oz (52.4 kg)  04/08/13 136 lb (61.689 kg)  04/02/13 124 lb (56.246 kg)  03/19/13 112 lb 10.5 oz (51.1 kg)    Usual Body Weight: 108 lb  % Usual Body Weight: 128%  BMI:  Body mass index is  27.13 kg/(m^2). highly skewed  Estimated Nutritional Needs: Kcal: 1300-1500 Protein: 65-75 gm Fluid: >/= 1.5 L  Skin: Intact  Diet Order: Diet Heart  EDUCATION NEEDS: -No education needs identified at this time   Intake/Output Summary (Last 24 hours) at 12/23/13 1433 Last data filed at 12/23/13 1415  Gross per 24 hour  Intake 1214.48 ml  Output   1275 ml  Net -60.52 ml    Labs:   Recent Labs Lab 12/21/13 2041 12/22/13 0525 12/23/13 0325  NA 133* 134* 138  K 5.8* 5.5* 4.9  CL 97 98 101  CO2 17* 18* 19  BUN 128* 128* 123*  CREATININE 3.45* 3.66* 3.78*  CALCIUM 8.1* 8.4 8.2*  GLUCOSE 163* 100* 123*    CBG (last 3)   Recent Labs  12/22/13 1627 12/23/13 0848 12/23/13 1216  GLUCAP 141* 92 133*    Scheduled Meds: . antiseptic oral rinse  7 mL Mouth Rinse BID  . aspirin EC  81 mg Oral Daily  . atorvastatin  10 mg Oral q1800  . furosemide  160 mg Intravenous Q6H  . insulin aspart  0-9 Units Subcutaneous TID WC  . isosorbide mononitrate  60 mg Oral Daily  . lubriderm seriously sensitive   Topical BID  . sodium chloride  3 mL Intravenous Q12H  . sodium chloride  3 mL Intravenous Q12H    Continuous Infusions: . milrinone 0.125 mcg/kg/min (12/22/13 1503)    Past Medical History  Diagnosis Date  .  Hypertension   . Pneumonia   . CHF (congestive heart failure) 08/2012  . Anemia 08/2012  . UTI (urinary tract infection) 08/22/2012  . Ischemic cardiomyopathy     EF 30-35%  . Coronary artery disease   . Pulmonary hypertension   . NSVT (nonsustained ventricular tachycardia) 02/2013  . Diabetes mellitus     Reportedly diagnosed 2011 but no medication initiated until 04/2011 (Metformin)  . Diabetic peripheral neuropathy     Since 2013  . Thrombocytopenia   . CKD (chronic kidney disease)   . Chronic systolic CHF (congestive heart failure)     Echo (10/16/13):  EF 30% to 35%. Akinesis of inf-lat and inferior, anterior, anterolateral, and apical myocardium.  Restrictive physiology.  Mild AI, MAC, mod MR, mild LAE, mild RVE, mild reduced RVSF, mild RAE, mod TR, Mod PI, PASP 79 mmHg, L pleural effusion.  Marland Kitchen Uncontrolled diabetes mellitus   . HTN (hypertension)   . Pulmonary HTN     a. Cath 02/2013: severe pulm HTN.  . CAD (coronary artery disease)     a. Cath 02/2013: severe 2V CAD - 100% mRCA with L-R collaterals; diffuse 80-95% subtotal OM2 (too diffusely diseased to attempt PCA), overall small caliber diffusely diseased coronaries c/w poorly controlled DM; treated medically.  . CKD (chronic kidney disease), stage IV   . Ischemic cardiomyopathy   . Anemia     a. Prior normal EGD 03/2013. b. Progressive anemia 06/2013 in setting of transient hematemesis that had resolved.   . Thrombocytopenia     a. Liver imaging normal 03/2013.  . Edema     a. RUE edema with neg duplex for DVT 06/2013.  Marland Kitchen Protein calorie malnutrition   . Poor social situation   . Ascites     a. s/p paracentesis 03/2013.  . Valvular heart disease     a. Echo 06/2013: mild AI, mod MR, mod TR, mod PR.    Past Surgical History  Procedure Laterality Date  . Cesarean section    . Tubal ligation    . Esophagogastroduodenoscopy N/A 03/13/2013    Procedure: ESOPHAGOGASTRODUODENOSCOPY (EGD);  Surgeon: Winfield Cunas., MD;  Location: [facility] ENDOSCOPY;  Service: Endoscopy;  Laterality: N/A;  . Cardiac catheterization  02/2013    severe 2 vessel CAD    Arthur Holms, RD, LDN Pager #: [phone] After-Hours Pager #: [phone]

## 2013-12-24 ENCOUNTER — Inpatient Hospital Stay (HOSPITAL_COMMUNITY): Payer: Medicaid Other

## 2013-12-24 DIAGNOSIS — R0600 Dyspnea, unspecified: Secondary | ICD-10-CM | POA: Insufficient documentation

## 2013-12-24 DIAGNOSIS — E1142 Type 2 diabetes mellitus with diabetic polyneuropathy: Secondary | ICD-10-CM | POA: Insufficient documentation

## 2013-12-24 DIAGNOSIS — R0689 Other abnormalities of breathing: Secondary | ICD-10-CM

## 2013-12-24 DIAGNOSIS — D46Z Other myelodysplastic syndromes: Secondary | ICD-10-CM | POA: Insufficient documentation

## 2013-12-24 DIAGNOSIS — Z515 Encounter for palliative care: Secondary | ICD-10-CM | POA: Insufficient documentation

## 2013-12-24 DIAGNOSIS — N170 Acute kidney failure with tubular necrosis: Secondary | ICD-10-CM

## 2013-12-24 DIAGNOSIS — E1140 Type 2 diabetes mellitus with diabetic neuropathy, unspecified: Secondary | ICD-10-CM

## 2013-12-24 DIAGNOSIS — L26 Exfoliative dermatitis: Secondary | ICD-10-CM | POA: Insufficient documentation

## 2013-12-24 DIAGNOSIS — L282 Other prurigo: Secondary | ICD-10-CM

## 2013-12-24 DIAGNOSIS — D638 Anemia in other chronic diseases classified elsewhere: Secondary | ICD-10-CM | POA: Insufficient documentation

## 2013-12-24 DIAGNOSIS — E46 Unspecified protein-calorie malnutrition: Secondary | ICD-10-CM | POA: Insufficient documentation

## 2013-12-24 LAB — BASIC METABOLIC PANEL
Anion gap: 19 — ABNORMAL HIGH (ref 5–15)
BUN: 117 mg/dL — ABNORMAL HIGH (ref 6–23)
CHLORIDE: 98 meq/L (ref 96–112)
CO2: 18 meq/L — AB (ref 19–32)
Calcium: 8.2 mg/dL — ABNORMAL LOW (ref 8.4–10.5)
Creatinine, Ser: 3.57 mg/dL — ABNORMAL HIGH (ref 0.50–1.10)
GFR calc Af Amer: 14 mL/min — ABNORMAL LOW (ref >90)
GFR calc non Af Amer: 13 mL/min — ABNORMAL LOW (ref >90)
GLUCOSE: 78 mg/dL (ref 70–99)
Potassium: 4.2 mEq/L (ref 3.7–5.3)
SODIUM: 135 meq/L — AB (ref 137–147)

## 2013-12-24 LAB — GLUCOSE, CAPILLARY
GLUCOSE-CAPILLARY: 64 mg/dL — AB (ref 70–99)
GLUCOSE-CAPILLARY: 156 mg/dL — AB (ref 70–99)
GLUCOSE-CAPILLARY: 114 mg/dL — AB (ref 70–99)
GLUCOSE-CAPILLARY: 133 mg/dL — AB (ref 70–99)
Glucose-Capillary: 105 mg/dL — ABNORMAL HIGH (ref 70–99)
Glucose-Capillary: 211 mg/dL — ABNORMAL HIGH (ref 70–99)

## 2013-12-24 LAB — PREPARE RBC (CROSSMATCH)

## 2013-12-24 LAB — URINE CULTURE
COLONY COUNT: NO GROWTH
CULTURE: NO GROWTH

## 2013-12-24 LAB — TYPE AND SCREEN
ABO/RH(D): B POS
Antibody Screen: NEGATIVE
Unit division: 0
Unit division: 0

## 2013-12-24 LAB — CARBOXYHEMOGLOBIN
Carboxyhemoglobin: 1.9 % — ABNORMAL HIGH (ref 0.5–1.5)
Methemoglobin: 0.8 % (ref 0.0–1.5)
O2 Saturation: 76.1 %
Total hemoglobin: 6.8 g/dL — CL (ref 12.0–16.0)

## 2013-12-24 LAB — SEDIMENTATION RATE: SED RATE: 27 mm/h — AB (ref 0–22)

## 2013-12-24 MED ORDER — SODIUM CHLORIDE 0.9 % IV SOLN: Freq: Once | INTRAVENOUS | Status: AC

## 2013-12-24 MED ORDER — HYDROCERIN EX CREA
TOPICAL_CREAM | Freq: Two times a day (BID) | CUTANEOUS | Status: DC
  Administered 2013-12-24 – 2013-12-27 (×7): via TOPICAL
  Administered 2013-12-28 (×2): 1 via TOPICAL
  Administered 2013-12-28 – 2013-12-29 (×2): via TOPICAL
  Administered 2013-12-29 – 2013-12-30 (×2): 1 via TOPICAL
  Administered 2013-12-30 – 2013-12-31 (×2): via TOPICAL
  Administered 2013-12-31: 1 via TOPICAL
  Administered 2014-01-01 – 2014-01-02 (×3): via TOPICAL
  Administered 2014-01-02: 1 via TOPICAL
  Administered 2014-01-03 (×2): via TOPICAL
  Administered 2014-01-04: 1 via TOPICAL
  Administered 2014-01-04: 22:00:00 via TOPICAL
  Administered 2014-01-05: 1 via TOPICAL
  Administered 2014-01-06 – 2014-01-08 (×6): via TOPICAL
  Administered 2014-01-08: 1 via TOPICAL
  Administered 2014-01-09 – 2014-01-10 (×2): via TOPICAL
  Filled 2013-12-24 (×2): qty 113

## 2013-12-24 MED ORDER — SODIUM CHLORIDE 0.9 % IV SOLN
Freq: Once | INTRAVENOUS | Status: AC
  Administered 2013-12-24: via INTRAVENOUS

## 2013-12-24 MED ORDER — SODIUM CHLORIDE 0.9 % IJ SOLN
10.0000 mL | Freq: Two times a day (BID) | INTRAMUSCULAR | Status: DC
  Administered 2013-12-24: 20 mL
  Administered 2013-12-24: 10 mL
  Administered 2013-12-26: 30 mL
  Administered 2013-12-27 – 2013-12-30 (×8): 10 mL
  Administered 2013-12-31: 20 mL
  Administered 2013-12-31: 10 mL
  Administered 2014-01-01: 20 mL
  Administered 2014-01-02: 10 mL
  Administered 2014-01-03 (×2): 20 mL
  Administered 2014-01-04: 10 mL

## 2013-12-24 MED ORDER — SODIUM CHLORIDE 0.9 % IJ SOLN: 10.0000 mL | INTRAMUSCULAR | Status: DC | PRN

## 2013-12-24 NOTE — Progress Notes (Addendum)
Advanced Heart Failure Rounding Note   Subjective:    63 yo with history of CAD and ischemic cardiomyopathy, chronic CHF with predominantly pulmonary venous hypertension, GI bleed, and CKD . Admitted with volume overload.   Renal following. Currently diuresing sluggishly with lasix 160 mg IV every 6 hours + milrinone 0.125 mcg . S/P paracentesis 1.1 liters removed. Weight down 7 pounds.   Hungry this am. Remains weak. Hard to get other history due to language barrier   Objective:   Weight Range:  Vital Signs:   Temp:  [95.5 F (35.3 C)-97.5 F (36.4 C)] 97.3 F (36.3 C) (11/17 0400) Pulse Rate:  [60-85] 64 (11/17 0634) Resp:  [7-20] 13 (11/17 0634) BP: (115-130)/(47-74) 115/74 mmHg (11/17 0400) SpO2:  [98 %-100 %] 99 % (11/17 0634) Last BM Date: 12/23/13  Weight change: Filed Weights   12/21/13 2100 12/22/13 0413 12/23/13 0429  Weight: 108 lb (48.988 kg) 138 lb 3.7 oz (62.7 kg) 138 lb 14.2 oz (63 kg)    Intake/Output:   Intake/Output Summary (Last 24 hours) at 12/24/13 0755 Last data filed at 12/24/13 0634  Gross per 24 hour  Intake   1411 ml  Output   2065 ml  Net   -654 ml     Physical Exam: General:  Chronically ill appearing. No resp difficulty HEENT: normal diffuse exfoliative rash Neck: supple. JVP to jaw . Carotids 2+ bilat; no bruits. No lymphadenopathy or thryomegaly appreciated. Cor: PMI nondisplaced. Regular rate & rhythm. No rubs,  or murmurs. + S3  Lungs: clear Abdomen: soft, nontender, +distended. No hepatosplenomegaly. No bruits or masses. Good bowel sounds. Extremities: no cyanosis, clubbing, rash, edema RUE swelling infiltrate Neuro: alert & orientedx3, cranial nerves grossly intact. moves all 4 extremities w/o difficulty. Affect pleasant Skin: Dry scales all over her body. Foam dressing noted on sacrum.   Telemetry: SR   Labs: Basic Metabolic Panel:  Recent Labs Lab 12/21/13 2041 12/22/13 0525 12/23/13 0325 12/24/13 0320  NA 133* 134*  138 135*  K 5.8* 5.5* 4.9 4.2  CL 97 98 101 98  CO2 17* 18* 19 18*  GLUCOSE 163* 100* 123* 78  BUN 128* 128* 123* 117*  CREATININE 3.45* 3.66* 3.78* 3.57*  CALCIUM 8.1* 8.4 8.2* 8.2*    Liver Function Tests:  Recent Labs Lab 12/21/13 2041  AST 45*  ALT 35  ALKPHOS 163*  BILITOT 0.8  PROT 7.1  ALBUMIN 2.8*   No results for input(s): LIPASE, AMYLASE in the last 168 hours. No results for input(s): AMMONIA in the last 168 hours.  CBC:  Recent Labs Lab 12/21/13 2041 12/22/13 0525 12/23/13 0325  WBC 3.5* 4.2 3.8*  NEUTROABS 1.7  --   --   HGB 7.7* 7.4* 7.0*  HCT 23.9* 22.3* 21.0*  MCV 101.3* 100.9* 101.0*  PLT 37* 39* 41*    Cardiac Enzymes:  Recent Labs Lab 12/22/13 0525 12/22/13 1010 12/22/13 1610  TROPONINI <0.30 <0.30 <0.30    BNP: BNP (last 3 results)  Recent Labs  10/27/13 1606 10/30/13 0531 12/21/13 2041  PROBNP 16497.0* 12300.0* 15092.0*     Other results:  EKG:   Imaging: US Renal  12/23/2013   CLINICAL DATA:  62 year old female with acute renal injury. Initial encounter.  EXAM: RENAL/URINARY TRACT ULTRASOUND COMPLETE  COMPARISON:  Abdomen MRI 03/12/2013 and earlier.  FINDINGS: Right Kidney:  Length: 7.9 cm.  Echogenic cortex.  No hydronephrosis or renal mass.  Left Kidney:  Length: 10.6 cm. No hydronephrosis. Cortical echogenicity not  as pronounced as that on the right. Small simple appearing cortical cysts measure up to 12 mm and are stable.  Bladder:  Not visualized, reportedly a Foley catheter is in place.  Other findings: Small volume ascites is new. Right pleural effusion, which was on prior studies.  IMPRESSION: 1. Chronic medical renal disease perhaps greater on the right. No acute renal findings. 2. New small volume ascites.  Chronic right pleural effusion.   Electronically Signed   By: Lars Pinks M.D.   On: 12/23/2013 08:55   US Paracentesis  12/23/2013   CLINICAL DATA:  Abdominal distention, congestive heart failure, renal  insufficiency. Request therapeutic paracentesis.  EXAM: ULTRASOUND GUIDED PARACENTESIS  COMPARISON:  None.  PROCEDURE: An ultrasound guided paracentesis was thoroughly discussed with the patient and questions answered. The benefits, risks, alternatives and complications were also discussed. The patient understands and wishes to proceed with the procedure. Written consent was obtained.  Ultrasound was performed to localize and mark an adequate pocket of fluid in the right lower quadrant of the abdomen. The area was then prepped and draped in the normal sterile fashion. 1% Lidocaine was used for local anesthesia. Under ultrasound guidance a 19 gauge Yueh catheter was introduced. Paracentesis was performed. The catheter was removed and a dressing applied.  COMPLICATIONS: None immediate  FINDINGS: A total of approximately 1.1 L of bloody ascitic fluid was removed. A fluid sample was not sent for laboratory analysis.  IMPRESSION: Successful ultrasound guided paracentesis yielding 1.1 L of ascites.  Read by: Ascencion Dike PA-C   Electronically Signed   By: Markus Daft M.D.   On: 12/23/2013 15:59     Medications:     Scheduled Medications: . antiseptic oral rinse  7 mL Mouth Rinse BID  . aspirin EC  81 mg Oral Daily  . atorvastatin  10 mg Oral q1800  . feeding supplement (GLUCERNA SHAKE)  237 mL Oral Daily  . furosemide  160 mg Intravenous Q6H  . insulin aspart  0-9 Units Subcutaneous TID WC  . isosorbide mononitrate  60 mg Oral Daily  . lubriderm seriously sensitive   Topical BID  . sodium chloride  3 mL Intravenous Q12H  . sodium chloride  3 mL Intravenous Q12H    Infusions: . milrinone 0.125 mcg/kg/min (12/23/13 2326)    PRN Medications: sodium chloride, acetaminophen **OR** acetaminophen, camphor-menthol, hydrOXYzine, ondansetron **OR** ondansetron (ZOFRAN) IV, oxyCODONE, sodium chloride, zolpidem   Assessment:  1. A/C Systolic Heart Failure. ICM EF 30-35% with cardiorenal syndrome 2. Acute  on chronic renal failure Stage IV 3. DM2 4. H/O Gout 5. H/O GI bleed 10/2013  6. Diffuse scaly rash 7. MDS with pancytopenia   Plan/Discussion:    Renal function down a little. 3.7> 3.5. Weight down 6 pounds. Continue lasix + milrinone. Output not accurate. Refused central line.    Place PICC she is not a candidate for HD.   S/P Paracentesis 1.1 liters removed.     Length of Stay: 3  CLEGG,AMY NP-C  12/24/2013, 7:55 AM  Advanced Heart Failure Team Pager [phone] (M-F; [place])  Please contact [facility] for night-coverage after hours (4p -7a ) and weekends on [email or web address]  Patient seen and examined with Darrick Grinder, NP. We discussed all aspects of the encounter. I agree with the assessment and plan as stated above.   Seems to be improving with IV lasix and milrinone. Prominent s3 on exam. Suspect low output HF is major driver. Will place PICC to monitor more closely.  Agree that she is not HD candidate. We will do what we can to help improve her hemodynamics and volume status but may be getting to point where palliative care is best option particularly in light of her hematologic malignancy. She is quite anemic and would consider transfusing 1-2u RBCs.    Timtohy Broski,MD 11:50 AM

## 2013-12-24 NOTE — Clinical Documentation Improvement (Signed)
  Presented with hypothermia T- 94.0, WBC 4.0 dropped to 3.8, SBP 138 to 116, R- down to 16 to 7 requiring [place], AKI on CKD IV, MDS, cellulitis from scratching generalized rash, A/C Combined CHF. ED Note gives diagnosis Level II Sepsis and SIRS treated with Zosyn and Vancomycin in ED. If you agree please add to documentation to reflect severity of illness and risk of mortality.  Possible Conditions -- Early Sepsis -- Early Sepsis with SIRS -- Other condition  Thank you,  Ezekiel Ina ,RN Clinical Documentation Specialist:  [place] [facility]

## 2013-12-24 NOTE — Progress Notes (Signed)
Patient ID: [name] [name], female   DOB: 1950/11/20, 63 y.o.   MRN: [id number]  [facility] Progress Note    Assessment/ Plan:   1. Acute renal failure on chronic kidney disease stage 4. Suspected to be hemodynamically mediated acute renal failure with transient hypotension in this woman with congestive heart failure/myelodysplastic syndrome. Urine output appears to be fair in response to furosemide/milrinone status post paracentesis. Renal function appears to be improving slowly and there are no acute electrolytes/uremic symptoms noted. She is a very marginal candidate for long-term hemodialysis given her history of congestive heart failure/myelodysplastic syndrome and a palliative care approach would probably give her the best quality of life. 2. Hypertension/volume - she remains volume overloaded at this time. Blood pressures fairly controlled-tolerating current dose of furosemide with fair urine output  3. Anemia - myelodysplastic syndrome with pancytopenia. Transfusions as needed for anemia. 4. Itching/rash- with global ichthyosis-symptomatic relief with Sarna/hydroxyzine. ANA negative.  Subjective:   Reports to be feeling fair this morning and indicates that she is comfortable.   Objective:   BP 117/49 mmHg  Pulse 68  Temp(Src) 97.6 F (36.4 C) (Oral)  Resp 11  Ht 5' (1.524 m)  Wt 59.648 kg (131 lb 8 oz)  BMI 25.68 kg/m2  SpO2 99%  Intake/Output Summary (Last 24 hours) at 12/24/13 0826 Last data filed at 12/24/13 2353  Gross per 24 hour  Intake   1411 ml  Output   2065 ml  Net   -654 ml   Weight change:   Physical Exam: IRW:[id number] resting in bed-eating ice chips. With global exfoliative skin rash. PPJ:KDTOI regular in rate and rhythm, S1 and S2 normal Resp:decreased breath sounds both bases with intermittent fine rales ZTI:WPYK, slightly distended, bowel sounds normal Ext:2+ lower extremity edema  Imaging: US Renal  12/23/2013   CLINICAL DATA:   63 year old female with acute renal injury. Initial encounter.  EXAM: RENAL/URINARY TRACT ULTRASOUND COMPLETE  COMPARISON:  Abdomen MRI 03/12/2013 and earlier.  FINDINGS: Right Kidney:  Length: 7.9 cm.  Echogenic cortex.  No hydronephrosis or renal mass.  Left Kidney:  Length: 10.6 cm. No hydronephrosis. Cortical echogenicity not as pronounced as that on the right. Small simple appearing cortical cysts measure up to 12 mm and are stable.  Bladder:  Not visualized, reportedly a Foley catheter is in place.  Other findings: Small volume ascites is new. Right pleural effusion, which was on prior studies.  IMPRESSION: 1. Chronic medical renal disease perhaps greater on the right. No acute renal findings. 2. New small volume ascites.  Chronic right pleural effusion.   Electronically Signed   By: Lars Pinks M.D.   On: 12/23/2013 08:55   US Paracentesis  12/23/2013   CLINICAL DATA:  Abdominal distention, congestive heart failure, renal insufficiency. Request therapeutic paracentesis.  EXAM: ULTRASOUND GUIDED PARACENTESIS  COMPARISON:  None.  PROCEDURE: An ultrasound guided paracentesis was thoroughly discussed with the patient and questions answered. The benefits, risks, alternatives and complications were also discussed. The patient understands and wishes to proceed with the procedure. Written consent was obtained.  Ultrasound was performed to localize and mark an adequate pocket of fluid in the right lower quadrant of the abdomen. The area was then prepped and draped in the normal sterile fashion. 1% Lidocaine was used for local anesthesia. Under ultrasound guidance a 19 gauge Yueh catheter was introduced. Paracentesis was performed. The catheter was removed and a dressing applied.  COMPLICATIONS: None immediate  FINDINGS: A total of approximately 1.1 L of  bloody ascitic fluid was removed. A fluid sample was not sent for laboratory analysis.  IMPRESSION: Successful ultrasound guided paracentesis yielding 1.1 L of  ascites.  Read by: Ascencion Dike PA-C   Electronically Signed   By: Markus Daft M.D.   On: 12/23/2013 15:59    Labs: BMET  Recent Labs Lab 12/21/13 2041 12/22/13 0525 12/23/13 0325 12/24/13 0320  NA 133* 134* 138 135*  K 5.8* 5.5* 4.9 4.2  CL 97 98 101 98  CO2 17* 18* 19 18*  GLUCOSE 163* 100* 123* 78  BUN 128* 128* 123* 117*  CREATININE 3.45* 3.66* 3.78* 3.57*  CALCIUM 8.1* 8.4 8.2* 8.2*   CBC  Recent Labs Lab 12/21/13 2041 12/22/13 0525 12/23/13 0325  WBC 3.5* 4.2 3.8*  NEUTROABS 1.7  --   --   HGB 7.7* 7.4* 7.0*  HCT 23.9* 22.3* 21.0*  MCV 101.3* 100.9* 101.0*  PLT 37* 39* 41*   Medications:    . antiseptic oral rinse  7 mL Mouth Rinse BID  . aspirin EC  81 mg Oral Daily  . atorvastatin  10 mg Oral q1800  . feeding supplement (GLUCERNA SHAKE)  237 mL Oral Daily  . furosemide  160 mg Intravenous Q6H  . insulin aspart  0-9 Units Subcutaneous TID WC  . isosorbide mononitrate  60 mg Oral Daily  . lubriderm seriously sensitive   Topical BID  . sodium chloride  3 mL Intravenous Q12H  . sodium chloride  3 mL Intravenous Q12H   Elmarie Shiley, MD 12/24/2013, 8:26 AM

## 2013-12-24 NOTE — Progress Notes (Signed)
Peripherally Inserted Central Catheter/Midline Placement  The IV Nurse has discussed with the patient and/or persons authorized to consent for the patient, the purpose of this procedure and the potential benefits and risks involved with this procedure.  The benefits include less needle sticks, lab draws from the catheter and patient may be discharged home with the catheter.  Risks include, but not limited to, infection, bleeding, blood clot (thrombus formation), and puncture of an artery; nerve damage and irregular heat beat.  Alternatives to this procedure were also discussed.  PICC/Midline Placement Documentation        [name] [name] 12/24/2013, 2:56 PM

## 2013-12-24 NOTE — Progress Notes (Signed)
Peripherally Inserted Central Catheter/Midline Placement/Exchange  The IV Nurse has discussed with the patient and/or persons authorized to consent for the patient, the purpose of this procedure and the potential benefits and risks involved with this procedure.  The benefits include less needle sticks, lab draws from the catheter and patient may be discharged home with the catheter.  Risks include, but not limited to, infection, bleeding, blood clot (thrombus formation), and puncture of an artery; nerve damage and irregular heat beat.  Alternatives to this procedure were also discussed. PICC exchanged due to dislodgement and retraction after earlier insertion.   PICC/Midline Placement Documentation  PICC / Midline Double Lumen 09/81/19 PICC Right Basilic 39 cm 1 cm (Active)       Aldona Lento L 12/24/2013, 11:14 PM

## 2013-12-24 NOTE — Progress Notes (Addendum)
Nutrition Consult/Brief Note  RD consulted for low sodium diet education.  Pt sleeping upon RD visit.  No family at bedside.  Left "Low Sodium Nutrition Therapy" handouts from [employer] on patient's tray table.  Also, left message on patient's son's ([place]) cell phone.  RD available should patient and/or family have questions and/or concerns.  Arthur Holms, RD, LDN Pager #: [phone] After-Hours Pager #: [phone]

## 2013-12-24 NOTE — Consult Note (Signed)
Patient [name] [name]      DOB: 12/16/1950      ZSW:[id number]     Consult Note from the Palliative Medicine Team at [place] Requested by: [name] [name]    PCP: [name] Chessman, MD Reason for Consultation: Goals of Care   Phone Number:[phone]  Assessment/Recommendations: 63 yo [name] with PMHx of systolic HF, Pulm HTN, CKD, MDS who presented with volume overload. Multiple hospitalizations this year. Palliative care consulted for goals of care.    1.  Code Status: Full (as per prior documentation)  2. Goals of Care: Spoke with patient with interpreter and son over phone. Patient is okay with medical teams talking with son [name] [name] but wants to be involved in medical decision making. In speaking with them both today, patient has fair understanding of general situation, but son actually has fairly good knowledge of issues she faces.  He verbalizes that she is "not in a good state", "heart, kidney, lungs not working in harmony", "Hgb and blood cells well below normal", "Her condition is not curable".  He also expressed how it is sometimes hard to convince her to come to hospital.  I spoke with [name] [name] and her attempts to talk about comfort care have not been received well.  In talking with patient today, she hopes that [name]'s will continue "to do all they can"- this statement was not in response to any question about treatment options.   I think the "do all we can" need's to be put in context of her medical conditions and likely outcomes of procedures.    In talking with her son, I expressed concern I think we all have as her physicians about her trajectory and where her multiple co morbidities are leading.  He acknowledge from prior conversations that they need to hope for the best, but also prepare for the worst.  He will talk about living will and advance care planning with his mom. I offered to be of assistance with this, but he wishes to talk with her first.  He has  our contact info should questions arise. I dont think these expressed concerns are anything new for him.  Will respect his space in talking with his mom. Our team may try to re-engage and see if ?'s arise.  [name] was living at home with her family (between [name] [name] and his brother/sister at home she has near 24h supervision). He hopes she can return home.  In talking with patient today, she is okay with things like central lines (confirms this with me). Son thinks she just needs more clarification on why procedure wants to be done and what potential risks are.    [name] [name] will be on service and will follow along at least intermittently.   3. Symptom Management:   1. Dyspnea- resolving with inotropic agent and IV lasix, diuresis 2. Pruritis- PRN hydroxyzine.    4. Psychosocial/Spiritual: [name], lives at home with 2 sons and daughter.      Brief HPI: 63 yo female with PMHx of systolic HF, Pulm HTN, high-grade MDS, CKD who presented on 11/14 with shortness of breath and edema.  This is her 3rd admission since Sept. One of those admissions was for hypoglycemia, and the other for CHF exacerbation. Has other admissions this year for CHF as well.  Reportedly compliant with torsemide and diet at home.  Recent visit with [name] [name] regarding MDS and consideration of comfort care was made there as well. Has been getting  PRN blood transfusion every 1-2 weeks as outpatient. On admission, noted to be volume overloaded and with AKI.  Also extensive skin rash. She has been evaluated by cardiology with resultant lasix IV and milrinone drips being initiated.  Hypothermia required bear hugger.   Spoke with patient today through [place].  She has fair understanding of above issues. Does not have any acute complaints such as pain, N/V, constipation, diarrhea, insomnia, anxiety/depression.  Skin rash is pruritis at times.      PMH:  Past Medical History  Diagnosis Date  . Hypertension   .  Pneumonia   . CHF (congestive heart failure) 08/2012  . Anemia 08/2012  . UTI (urinary tract infection) 08/22/2012  . Ischemic cardiomyopathy     EF 30-35%  . Coronary artery disease   . Pulmonary hypertension   . NSVT (nonsustained ventricular tachycardia) 02/2013  . Diabetes mellitus     Reportedly diagnosed 2011 but no medication initiated until 04/2011 (Metformin)  . Diabetic peripheral neuropathy     Since 2013  . Thrombocytopenia   . CKD (chronic kidney disease)   . Chronic systolic CHF (congestive heart failure)     Echo (10/16/13):  EF 30% to 35%. Akinesis of inf-lat and inferior, anterior, anterolateral, and apical myocardium. Restrictive physiology.  Mild AI, MAC, mod MR, mild LAE, mild RVE, mild reduced RVSF, mild RAE, mod TR, Mod PI, PASP 79 mmHg, L pleural effusion.  Marland Kitchen Uncontrolled diabetes mellitus   . HTN (hypertension)   . Pulmonary HTN     a. Cath 02/2013: severe pulm HTN.  . CAD (coronary artery disease)     a. Cath 02/2013: severe 2V CAD - 100% mRCA with L-R collaterals; diffuse 80-95% subtotal OM2 (too diffusely diseased to attempt PCA), overall small caliber diffusely diseased coronaries c/w poorly controlled DM; treated medically.  . CKD (chronic kidney disease), stage IV   . Ischemic cardiomyopathy   . Anemia     a. Prior normal EGD 03/2013. b. Progressive anemia 06/2013 in setting of transient hematemesis that had resolved.   . Thrombocytopenia     a. Liver imaging normal 03/2013.  . Edema     a. RUE edema with neg duplex for DVT 06/2013.  Marland Kitchen Protein calorie malnutrition   . Poor social situation   . Ascites     a. s/p paracentesis 03/2013.  . Valvular heart disease     a. Echo 06/2013: mild AI, mod MR, mod TR, mod PR.     PSH: Past Surgical History  Procedure Laterality Date  . Cesarean section    . Tubal ligation    . Esophagogastroduodenoscopy N/A 03/13/2013    Procedure: ESOPHAGOGASTRODUODENOSCOPY (EGD);  Surgeon: Winfield Cunas., MD;  Location: [facility]  ENDOSCOPY;  Service: Endoscopy;  Laterality: N/A;  . Cardiac catheterization  02/2013    severe 2 vessel CAD   I have reviewed the FH and SH and  If appropriate update it with new information. No Known Allergies Scheduled Meds: . antiseptic oral rinse  7 mL Mouth Rinse BID  . aspirin EC  81 mg Oral Daily  . atorvastatin  10 mg Oral q1800  . feeding supplement (GLUCERNA SHAKE)  237 mL Oral Daily  . furosemide  160 mg Intravenous Q6H  . hydrocerin   Topical BID  . insulin aspart  0-9 Units Subcutaneous TID WC  . isosorbide mononitrate  60 mg Oral Daily  . lubriderm seriously sensitive   Topical BID  . sodium chloride  3  mL Intravenous Q12H  . sodium chloride  3 mL Intravenous Q12H   Continuous Infusions: . milrinone 0.125 mcg/kg/min (12/24/13 0800)   PRN Meds:.sodium chloride, acetaminophen **OR** acetaminophen, camphor-menthol, hydrOXYzine, ondansetron **OR** ondansetron (ZOFRAN) IV, oxyCODONE, sodium chloride, zolpidem    BP 113/58 mmHg  Pulse 68  Temp(Src) 97.6 F (36.4 C) (Oral)  Resp 14  Ht 5' (1.524 m)  Wt 59.648 kg (131 lb 8 oz)  BMI 25.68 kg/m2  SpO2 99%   PPS: 40   Intake/Output Summary (Last 24 hours) at 12/24/13 1317 Last data filed at 12/24/13 1258  Gross per 24 hour  Intake 989.64 ml  Output   2165 ml  Net -1175.36 ml    Physical Exam:  General: Alert, NAD HEENT:  [place], sclera anciteric Chest:  CTAB CVS: regular rate Abdomen:soft, ND Ext: edematous Skin: exfoliative rash diffusely, heaviest on face  Labs: CBC    Component Value Date/Time   WBC 3.8* 12/23/2013 0325   WBC 4.8 12/11/2013 0940   WBC 5.2 03/05/2013 1916   RBC 2.08* 12/23/2013 0325   RBC 2.62* 12/11/2013 0940   RBC 3.95* 03/05/2013 1916   RBC 2.90* 08/22/2012 0907   HGB 7.0* 12/23/2013 0325   HGB 8.5* 12/11/2013 0940   HGB 11.3* 03/05/2013 1916   HCT 21.0* 12/23/2013 0325   HCT 26.2* 12/11/2013 0940   HCT 37.5* 03/05/2013 1916   PLT 41* 12/23/2013 0325   PLT 51* 12/11/2013  0940   MCV 101.0* 12/23/2013 0325   MCV 99.9 12/11/2013 0940   MCV 95.0 03/05/2013 1916   MCH 33.7 12/23/2013 0325   MCH 32.4 12/11/2013 0940   MCH 28.6 03/05/2013 1916   MCHC 33.3 12/23/2013 0325   MCHC 32.4 12/11/2013 0940   MCHC 30.1* 03/05/2013 1916   RDW 21.8* 12/23/2013 0325   RDW 23.5* 12/11/2013 0940   LYMPHSABS 0.4* 12/21/2013 2041   LYMPHSABS 0.4* 12/11/2013 0940   MONOABS 0.4 12/21/2013 2041   MONOABS 0.3 12/11/2013 0940   EOSABS 1.0* 12/21/2013 2041   EOSABS 2.5* 12/11/2013 0940   BASOSABS 0.0 12/21/2013 2041   BASOSABS 0.0 12/11/2013 0940    BMET    Component Value Date/Time   NA 135* 12/24/2013 0320   NA 129* 11/12/2013 1205   K 4.2 12/24/2013 0320   K 4.2 11/12/2013 1205   CL 98 12/24/2013 0320   CO2 18* 12/24/2013 0320   CO2 25 11/12/2013 1205   GLUCOSE 78 12/24/2013 0320   GLUCOSE 169* 11/12/2013 1205   BUN 117* 12/24/2013 0320   BUN 95.4* 11/12/2013 1205   CREATININE 3.57* 12/24/2013 0320   CREATININE 2.2* 11/12/2013 1205   CREATININE 1.57* 01/27/2013 1315   CALCIUM 8.2* 12/24/2013 0320   CALCIUM 8.8 11/12/2013 1205   GFRNONAA 13* 12/24/2013 0320   GFRAA 14* 12/24/2013 0320    CMP     Component Value Date/Time   NA 135* 12/24/2013 0320   NA 129* 11/12/2013 1205   K 4.2 12/24/2013 0320   K 4.2 11/12/2013 1205   CL 98 12/24/2013 0320   CO2 18* 12/24/2013 0320   CO2 25 11/12/2013 1205   GLUCOSE 78 12/24/2013 0320   GLUCOSE 169* 11/12/2013 1205   BUN 117* 12/24/2013 0320   BUN 95.4* 11/12/2013 1205   CREATININE 3.57* 12/24/2013 0320   CREATININE 2.2* 11/12/2013 1205   CREATININE 1.57* 01/27/2013 1315   CALCIUM 8.2* 12/24/2013 0320   CALCIUM 8.8 11/12/2013 1205   PROT 7.1 12/21/2013 2041   PROT 7.7 11/12/2013  1205   ALBUMIN 2.8* 12/21/2013 2041   ALBUMIN 2.9* 11/12/2013 1205   AST 45* 12/21/2013 2041   AST 24 11/12/2013 1205   ALT 35 12/21/2013 2041   ALT 22 11/12/2013 1205   ALKPHOS 163* 12/21/2013 2041   ALKPHOS 174* 11/12/2013  1205   BILITOT 0.8 12/21/2013 2041   BILITOT 1.14 11/12/2013 1205   GFRNONAA 13* 12/24/2013 0320   GFRAA 14* 12/24/2013 0320   11/14 CXR IMPRESSION: Cardiomegaly with pulmonary vascular congestion.   11/16 Renal US IMPRESSION: 1. Chronic medical renal disease perhaps greater on the right. No acute renal findings. 2. New small volume ascites. Chronic right pleural effusion.  9/9 Echo - Procedure narrative: Transthoracic echocardiography. Image quality was adequate. The study was technically difficult. - Left ventricle: The cavity size was normal. Wall thickness was normal. Systolic function was moderately to severely reduced. The estimated ejection fraction was in the range of 30% to 35%. Akinesis of the mid-apicalinferolateral and inferior myocardium. Akinesis of the mid-apicalanterior, anterolateral, and apical myocardium. Doppler parameters are consistent with restrictive physiology, indicative of decreased left ventricular diastolic compliance and/or increased left atrial pressure. - Aortic valve: There was mild regurgitation. - Mitral valve: Calcified annulus. There was moderate regurgitation directed centrally. - Left atrium: The atrium was mildly dilated. - Right ventricle: The cavity size was mildly dilated. Systolic function was mildly reduced. - Right atrium: The atrium was mildly dilated. - Tricuspid valve: There was moderate regurgitation. - Pulmonic valve: There was moderate regurgitation. - Pulmonary arteries: Systolic pressure was severely increased. PA peak pressure: 79 mm Hg (S). - Pericardium, extracardiac: A trivial pericardial effusion was identified. There was a left pleural effusion.  Total Time:90 minutes Greater than 50%  of this time was spent counseling and coordinating care related to the above assessment and plan. I was able to also discuss case with outpatient Oncologist [name] [name].     Doran Clay  D.O. Palliative Medicine Team at [facility]  Pager: [phone] Team Phone: [phone]

## 2013-12-24 NOTE — Progress Notes (Signed)
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 On admission, patient was found to be in acute heart failure with much of her volume overload presenting as ascites-similar to past admissions. She was also noted to have a creatinine of 3.45 and hypothermic requiring Customer service manager. Patient placed in stepdown unit and started on IV Lasix. Noted on admission to have diffuse rash with secondary itching. Initially there was concern about sepsis and antibiotics started, discontinued the following morning as no immediate signs of infection noted  11/16 she had not been responding very well to Lasix alone. Seen by nephrology and cardiology. Creatinine progressively worsening. Through son, patient declined central line needed for IV milrinone but Cardiology was able to have PICC placed instead since she is NOT a candidate for HD. She has continued to have issues with hypothermic requiring Retail banker.  11/17 OP notes from oncology reviewed. Prognosis documented as poor and NOT candidate for systemic treatment of high-grade myelodysplastic syndrome. Dr. Alvy Bimler recommended the option of Hospice but also felt the family at that time was not ready. She also recommended supportive care and to transfuse if Hgb </= 8.0.  HPI/Subjective: Awake-no translator at bedside but I was able to determine that she was not SOB or having any chest or abdominal pain; she does not like the hospital food but would like Asian food such as rice and vegetables; continues with  itching.  Assessment/Plan:   Acute on chronic combined systolic and diastolic congestive heart failure/ICM/ Ascites -Cont IV Lasix and Milrinone (per Cards).  -Appreciate cardiology assistance.  -ascites from passive hepato-congestion; Interventional radiology performed therapeutic paracentesis with 1.1 L bloody fluid removed -cont Imdur    Acute renal failure on CKD (chronic kidney disease), stage IV with cardiorenal syndrome -Appreciate nephrology assistance -NOT a candidate for HD -recommendation is for Palliative eval    Essential hypertension -BP soft and recent episodes of hypotension so hydralazine dc'd    Hyperlipidemia -cont statin although with transaminitis consider dc    Type 2 diabetes mellitus with peripheral neuropathy -improved control since admission but pt with poor oral intake -cont SSI -HgbA1c was 6.2 Sept 2015      Protein calorie malnutrition -Nutrition eval -suspect primary anorexia from bowel edema from CHF -pt may do better with culturally appropriate food if can get family prepare according to heart healthy requirements    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 secondary to advanced chronic kidney disease or possibly even MDS. Suspect less likely drug rash.  -Topical lotion for now     DVT prophylaxis: SCDs Code Status: Full Family Communication: No family at bedside Disposition Plan/Expected LOS: SDU   Consultants: Cardiology/ Dr. Haroldine Laws Nephrology/Dr. Posey Pronto Palliative Care/Dr. Deitra Mayo  Procedures: Therapeutic paracentesis 11/16 IR  Cultures: Urine culture no growth C. Difficile PCR negative Blood cultures 2 negative  Antibiotics: One-time doses of Zosyn and vancomycin at  admission  Objective: Blood pressure 113/58, pulse 68, temperature 97.6 F (36.4 C), temperature source Oral, resp. rate 14, height 5' (1.524 m), weight 131 lb 8 oz (59.648 kg), SpO2 99 %.  Intake/Output Summary (Last 24 hours) at 12/24/13 1418 Last data filed at 12/24/13 1258  Gross per 24 hour  Intake 669.24 ml  Output   1840 ml  Net -1170.76 ml     Exam: Gen: No acute respiratory distress-appears chronically ill Chest: Clear to auscultation bilaterally without wheezes, rhonchi or crackles, room air Cardiac: Regular rate and rhythm, S1-S2,pansystolic murmur left sternal border fourth intercostal space, no rubs or gallops, patient supine so unable to appreciate if truly has JVD, minimal bilateral lower extremity edema Abdomen: Soft nontender nondistended without obvious hepatosplenomegaly, no ascites Extremities: Symmetrical in appearance without cyanosis, clubbing or effusion Skin: Extensive exfoliating dermatitis without any active bullae, saline rate changes or drainage  Scheduled Meds:  Scheduled Meds: . sodium chloride   Intravenous Once  . antiseptic oral rinse  7 mL Mouth Rinse BID  . aspirin EC  81 mg Oral Daily  . atorvastatin  10 mg Oral q1800  . feeding supplement (GLUCERNA SHAKE)  237 mL Oral Daily  . furosemide  160 mg Intravenous Q6H  . hydrocerin   Topical BID  . insulin aspart  0-9 Units Subcutaneous TID WC  . isosorbide mononitrate  60 mg Oral Daily  . lubriderm seriously sensitive   Topical BID  . sodium chloride  3 mL Intravenous Q12H  . sodium chloride  3 mL Intravenous Q12H   Continuous Infusions: . milrinone 0.125 mcg/kg/min (12/24/13 0800)    Data Reviewed: Basic Metabolic Panel:  Recent Labs Lab 12/21/13 2041 12/22/13 0525 12/23/13 0325 12/24/13 0320  NA 133* 134* 138 135*  K 5.8* 5.5* 4.9 4.2  CL 97 98 101 98  CO2 17* 18* 19 18*  GLUCOSE 163* 100* 123* 78  BUN 128* 128* 123* 117*  CREATININE 3.45* 3.66* 3.78* 3.57*  CALCIUM 8.1* 8.4  8.2* 8.2*   Liver Function Tests:  Recent Labs Lab 12/21/13 2041  AST 45*  ALT 35  ALKPHOS 163*  BILITOT 0.8  PROT 7.1  ALBUMIN 2.8*   No results for input(s): LIPASE, AMYLASE in the last 168 hours. No results for input(s): AMMONIA in the last 168 hours. CBC:  Recent Labs Lab 12/21/13 2041 12/22/13 0525 12/23/13 0325  WBC 3.5* 4.2 3.8*  NEUTROABS 1.7  --   --   HGB 7.7* 7.4* 7.0*  HCT 23.9* 22.3* 21.0*  MCV 101.3* 100.9* 101.0*  PLT 37* 39* 41*   Cardiac Enzymes:  Recent Labs Lab 12/22/13 0525 12/22/13 1010 12/22/13 1610  TROPONINI <0.30 <0.30 <0.30   BNP (last 3 results)  Recent Labs  10/27/13 1606 10/30/13 0531 12/21/13 2041  PROBNP 16497.0* 12300.0* 15092.0*   CBG:  Recent Labs Lab 12/22/13 1627 12/23/13 0848 12/23/13 1216 12/23/13 1649 12/23/13 2153  GLUCAP 141* 92 133* 145* 110*    Recent Results (from the past 240 hour(s))  Blood culture (routine x 2)     Status: None (Preliminary result)   Collection Time: 12/21/13  9:24 PM  Result Value Ref Range Status   Specimen Description BLOOD RIGHT ARM  Final   Special Requests BOTTLES DRAWN AEROBIC AND ANAEROBIC 6CC EA  Final   Culture  Setup Time   Final    12/22/2013 19:28 Performed at [facility]   Final  BLOOD CULTURE RECEIVED NO GROWTH TO DATE CULTURE WILL BE HELD FOR 5 DAYS BEFORE ISSUING A FINAL NEGATIVE REPORT Note: Culture results may be compromised due to an excessive volume of blood received in culture bottles. Performed at [facility]    Report Status PENDING  Incomplete  Blood culture (routine x 2)     Status: None (Preliminary result)   Collection Time: 12/21/13  9:26 PM  Result Value Ref Range Status   Specimen Description BLOOD LEFT HAND  Final   Special Requests BOTTLES DRAWN AEROBIC ONLY 10CC  Final   Culture  Setup Time   Final    12/22/2013 19:27 Performed at [facility]    Culture   Final           BLOOD CULTURE  RECEIVED NO GROWTH TO DATE CULTURE WILL BE HELD FOR 5 DAYS BEFORE ISSUING A FINAL NEGATIVE REPORT Note: Culture results may be compromised due to an excessive volume of blood received in culture bottles. Performed at [facility]    Report Status PENDING  Incomplete  Urine culture     Status: None   Collection Time: 12/21/13 10:06 PM  Result Value Ref Range Status   Specimen Description URINE, CATHETERIZED  Final   Special Requests NONE  Final   Culture  Setup Time   Final    12/22/2013 20:36 Performed at [place] Performed at [facility]   Final   Culture NO GROWTH Performed at [facility]   Final   Report Status 12/24/2013 FINAL  Final  MRSA PCR Screening     Status: None   Collection Time: 12/22/13  4:20 AM  Result Value Ref Range Status   MRSA by PCR NEGATIVE NEGATIVE Final    Comment:        The GeneXpert MRSA Assay (FDA approved for NASAL specimens only), is one component of a comprehensive MRSA colonization surveillance program. It is not intended to diagnose MRSA infection nor to guide or monitor treatment for MRSA infections.   Clostridium Difficile by PCR     Status: None   Collection Time: 12/23/13  1:59 AM  Result Value Ref Range Status   C difficile by pcr NEGATIVE NEGATIVE Final     Studies:  Recent x-ray studies have been reviewed in detail by the Attending Physician  Time spent :      Erin Hearing, ANP [facility] Office  [phone] Pager [phone]   **If unable to reach the above provider after paging please contact the [facility] @ [phone]  On-Call/Text Page:      [email or web address]      password [email or web address]  If 7PM-7AM, please contact night-coverage [email or web address] Password [email or web address] 12/24/2013, 2:18 PM   LOS: 3 days   Examined patient with ANP Ebony Hail and discussed assessment and plan and agree with above plan. Patient with multiple complex medical problems> 40 minutes  spent directly on patient care

## 2013-12-25 ENCOUNTER — Telehealth: Payer: Self-pay | Admitting: Hematology and Oncology

## 2013-12-25 ENCOUNTER — Other Ambulatory Visit: Payer: Self-pay

## 2013-12-25 LAB — BASIC METABOLIC PANEL
Anion gap: 17 — ABNORMAL HIGH (ref 5–15)
BUN: 116 mg/dL — AB (ref 6–23)
CO2: 19 mEq/L (ref 19–32)
Calcium: 8.1 mg/dL — ABNORMAL LOW (ref 8.4–10.5)
Chloride: 99 mEq/L (ref 96–112)
Creatinine, Ser: 3.24 mg/dL — ABNORMAL HIGH (ref 0.50–1.10)
GFR calc Af Amer: 16 mL/min — ABNORMAL LOW (ref >90)
GFR calc non Af Amer: 14 mL/min — ABNORMAL LOW (ref >90)
GLUCOSE: 89 mg/dL (ref 70–99)
POTASSIUM: 4.1 meq/L (ref 3.7–5.3)
Sodium: 135 mEq/L — ABNORMAL LOW (ref 137–147)

## 2013-12-25 LAB — CBC
HCT: 26.3 % — ABNORMAL LOW (ref 36.0–46.0)
Hemoglobin: 8.5 g/dL — ABNORMAL LOW (ref 12.0–15.0)
MCH: 31 pg (ref 26.0–34.0)
MCHC: 32.3 g/dL (ref 30.0–36.0)
MCV: 96 fL (ref 78.0–100.0)
PLATELETS: 58 10*3/uL — AB (ref 150–400)
RBC: 2.74 MIL/uL — AB (ref 3.87–5.11)
RDW: 23.4 % — AB (ref 11.5–15.5)
WBC: 4.3 10*3/uL (ref 4.0–10.5)

## 2013-12-25 LAB — ANA: Anti Nuclear Antibody(ANA): NEGATIVE

## 2013-12-25 LAB — CARBOXYHEMOGLOBIN
Carboxyhemoglobin: 2 % — ABNORMAL HIGH (ref 0.5–1.5)
Carboxyhemoglobin: 2.1 % — ABNORMAL HIGH (ref 0.5–1.5)
METHEMOGLOBIN: 1 % (ref 0.0–1.5)
Methemoglobin: 1.1 % (ref 0.0–1.5)
O2 SAT: 82.9 %
O2 SAT: 93.1 %
Total hemoglobin: 9.5 g/dL — ABNORMAL LOW (ref 12.0–16.0)
Total hemoglobin: 10.3 g/dL — ABNORMAL LOW (ref 12.0–16.0)

## 2013-12-25 LAB — GLUCOSE, CAPILLARY
GLUCOSE-CAPILLARY: 161 mg/dL — AB (ref 70–99)
GLUCOSE-CAPILLARY: 167 mg/dL — AB (ref 70–99)
Glucose-Capillary: 84 mg/dL (ref 70–99)

## 2013-12-25 LAB — ANCA SCREEN W REFLEX TITER
Atypical p-ANCA Screen: NEGATIVE
c-ANCA Screen: NEGATIVE
p-ANCA Screen: NEGATIVE

## 2013-12-25 MED ORDER — BACITRACIN-NEOMYCIN-POLYMYXIN OINTMENT TUBE
TOPICAL_OINTMENT | Freq: Two times a day (BID) | CUTANEOUS | Status: DC
  Administered 2013-12-25 – 2013-12-28 (×5): via TOPICAL
  Administered 2013-12-28: 1 via TOPICAL
  Administered 2013-12-29: 23:00:00 via TOPICAL
  Administered 2013-12-29 – 2013-12-30 (×2): 1 via TOPICAL
  Administered 2013-12-30 – 2013-12-31 (×2): via TOPICAL
  Administered 2013-12-31: 1 via TOPICAL
  Administered 2014-01-01 (×2): via TOPICAL
  Administered 2014-01-02: 1 via TOPICAL
  Administered 2014-01-02 – 2014-01-03 (×3): via TOPICAL
  Administered 2014-01-04: 1 via TOPICAL
  Administered 2014-01-04: 22:00:00 via TOPICAL
  Administered 2014-01-05: 1 via TOPICAL
  Administered 2014-01-06 – 2014-01-08 (×7): via TOPICAL
  Administered 2014-01-09: 1 via TOPICAL
  Administered 2014-01-10: 10:00:00 via TOPICAL
  Filled 2013-12-25: qty 15

## 2013-12-25 MED ORDER — METOLAZONE 2.5 MG PO TABS
2.5000 mg | ORAL_TABLET | Freq: Two times a day (BID) | ORAL | Status: DC
  Administered 2013-12-25 – 2013-12-26 (×3): 2.5 mg via ORAL
  Filled 2013-12-25 (×4): qty 1

## 2013-12-25 NOTE — Progress Notes (Signed)
Patient ID: [name] [name], female   DOB: 1950-09-02, 63 y.o.   MRN: [id number]  [facility] Progress Note    Assessment/ Plan:   1. Acute renal failure on chronic kidney disease stage 4. Suspected to be hemodynamically mediated acute renal failure with transient hypotension in this woman with congestive heart failure/myelodysplastic syndrome. Urine output appears to be fair in response to furosemide/milrinone status post paracentesis. She continues to have slow renal recovery with relatively fixed urine output-no acute electrolyte abnormality/uremic symptoms. She is a poor candidate for long-term hemodialysis given her history of congestive heart failure/myelodysplastic syndrome and a palliative care approach would probably give her the best quality of life should she have worsening renal function/uremic symptoms, it would be difficult also to justify short-term dialysis for volume removal as I fear that she may not have renal recovery given baseline renal dysfunction. 2. Hypertension/volume - she remains volume overloaded at this time. Blood pressures fairly controlled-tolerating current dose of furosemide with fair urine output  3. Anemia - myelodysplastic syndrome with pancytopenia. Transfusions as needed for anemia. 4. Itching/rash- with global ichthyosis-symptomatic relief with Sarna/hydroxyzine. ANA negative.  I will sign off at this time-please call if further questions arise or if I can assist further in management.  Subjective:   Reports to be feeling better-complains of itching. Denies any abdominal pain.   Objective:   BP 136/45 mmHg  Pulse 64  Temp(Src) 97.5 F (36.4 C) (Oral)  Resp 9  Ht 5' (1.524 m)  Wt 61.644 kg (135 lb 14.4 oz)  BMI 26.54 kg/m2  SpO2 98%  Intake/Output Summary (Last 24 hours) at 12/25/13 0815 Last data filed at 12/25/13 0700  Gross per 24 hour  Intake  905.2 ml  Output   2025 ml  Net -1119.8 ml   Weight change:   Physical  Exam: WUJ:[id number] resting in bed AOZ:HYQMV regular rate and rhythm, S1 and S2 with ESM Resp:decreased breath sounds over bases-no distinct rales HQI:ONGE, slightly distended, bowel sounds normal Ext: 2+ edema bipedal  Imaging: US Renal  12/23/2013   CLINICAL DATA:  63 year old female with acute renal injury. Initial encounter.  EXAM: RENAL/URINARY TRACT ULTRASOUND COMPLETE  COMPARISON:  Abdomen MRI 03/12/2013 and earlier.  FINDINGS: Right Kidney:  Length: 7.9 cm.  Echogenic cortex.  No hydronephrosis or renal mass.  Left Kidney:  Length: 10.6 cm. No hydronephrosis. Cortical echogenicity not as pronounced as that on the right. Small simple appearing cortical cysts measure up to 12 mm and are stable.  Bladder:  Not visualized, reportedly a Foley catheter is in place.  Other findings: Small volume ascites is new. Right pleural effusion, which was on prior studies.  IMPRESSION: 1. Chronic medical renal disease perhaps greater on the right. No acute renal findings. 2. New small volume ascites.  Chronic right pleural effusion.   Electronically Signed   By: Lars Pinks M.D.   On: 12/23/2013 08:55   US Paracentesis  12/23/2013   CLINICAL DATA:  Abdominal distention, congestive heart failure, renal insufficiency. Request therapeutic paracentesis.  EXAM: ULTRASOUND GUIDED PARACENTESIS  COMPARISON:  None.  PROCEDURE: An ultrasound guided paracentesis was thoroughly discussed with the patient and questions answered. The benefits, risks, alternatives and complications were also discussed. The patient understands and wishes to proceed with the procedure. Written consent was obtained.  Ultrasound was performed to localize and mark an adequate pocket of fluid in the right lower quadrant of the abdomen. The area was then prepped and draped in the normal sterile fashion. 1%  Lidocaine was used for local anesthesia. Under ultrasound guidance a 19 gauge Yueh catheter was introduced. Paracentesis was performed. The  catheter was removed and a dressing applied.  COMPLICATIONS: None immediate  FINDINGS: A total of approximately 1.1 L of bloody ascitic fluid was removed. A fluid sample was not sent for laboratory analysis.  IMPRESSION: Successful ultrasound guided paracentesis yielding 1.1 L of ascites.  Read by: Ascencion Dike PA-C   Electronically Signed   By: Markus Daft M.D.   On: 12/23/2013 15:59   Dg Chest Port 1 View  12/24/2013   CLINICAL DATA:  Evaluate PICC line placement  EXAM: PORTABLE CHEST - 1 VIEW  COMPARISON:  12/14/2013  FINDINGS: The left arm PICC line tip is identified at the junction between the innominate vein and the SVC. The heart size is enlarged. There is atherosclerotic plaque within the aortic arch. There is no pleural effusion or edema. No airspace consolidation.  IMPRESSION: 1. Tip of the PICC line is at the junction between the innominate vein and SVC.   Electronically Signed   By: Kerby Moors M.D.   On: 12/24/2013 20:00   Dg Chest Port 1 View  12/24/2013   CLINICAL DATA:  Central line placement  EXAM: PORTABLE CHEST - 1 VIEW  COMPARISON:  12/21/2013  FINDINGS: Cardiomegaly is noted. There is left arm PICC line with tip in SVC. Tiny left apical pneumothorax. No acute infiltrate or pulmonary edema.  IMPRESSION: Left PICC line in place. No acute infiltrate or pulmonary edema. Tiny left apical pneumothorax.   Electronically Signed   By: Lahoma Crocker M.D.   On: 12/24/2013 16:43    Labs: BMET  Recent Labs Lab 12/21/13 2041 12/22/13 0525 12/23/13 0325 12/24/13 0320 12/25/13 0337  NA 133* 134* 138 135* 135*  K 5.8* 5.5* 4.9 4.2 4.1  CL 97 98 101 98 99  CO2 17* 18* 19 18* 19  GLUCOSE 163* 100* 123* 78 89  BUN 128* 128* 123* 117* 116*  CREATININE 3.45* 3.66* 3.78* 3.57* 3.24*  CALCIUM 8.1* 8.4 8.2* 8.2* 8.1*   CBC  Recent Labs Lab 12/21/13 2041 12/22/13 0525 12/23/13 0325 12/25/13 0337  WBC 3.5* 4.2 3.8* 4.3  NEUTROABS 1.7  --   --   --   HGB 7.7* 7.4* 7.0* 8.5*  HCT  23.9* 22.3* 21.0* 26.3*  MCV 101.3* 100.9* 101.0* 96.0  PLT 37* 39* 41* 58*    Medications:    . antiseptic oral rinse  7 mL Mouth Rinse BID  . aspirin EC  81 mg Oral Daily  . atorvastatin  10 mg Oral q1800  . feeding supplement (GLUCERNA SHAKE)  237 mL Oral Daily  . furosemide  160 mg Intravenous Q6H  . hydrocerin   Topical BID  . insulin aspart  0-9 Units Subcutaneous TID WC  . isosorbide mononitrate  60 mg Oral Daily  . lubriderm seriously sensitive   Topical BID  . sodium chloride  10-40 mL Intracatheter Q12H  . sodium chloride  3 mL Intravenous Q12H    Elmarie Shiley, MD 12/25/2013, 8:15 AM

## 2013-12-25 NOTE — Progress Notes (Signed)
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 On admission, patient was found to be in acute heart failure with ascites-similar to past admissions. She was also noted to have a creatinine of 3.45 and was hypothermic requiring Customer service manager. Patient placed in stepdown unit and started on IV Lasix. Noted on admission to have diffuse rash with secondary itching. Initially there was concern about sepsis and antibiotics started, discontinued the following morning as no immediate signs of infection noted  11/16 she had not been responding very well to Lasix alone. Seen by nephrology and cardiology. Creatinine progressively worsening. Through son, patient declined central line needed for IV milrinone but Cardiology was able to have PICC placed instead since she is NOT a candidate for HD. She has continued to have issues with hypothermia requiring Retail banker.  11/17 outpt notes from oncology reviewed. Prognosis documented as poor and NOT candidate for systemic treatment of high-grade myelodysplastic syndrome. Dr. Alvy Bimler recommended the option of Hospice but also felt the family at that time was not ready. She also recommended supportive care and to transfuse if Hgb </= 8.0.  HPI/Subjective: Awake and continues with itching. Son [place] at bedside.  Assessment/Plan:    Acute on chronic combined systolic and diastolic congestive heart failure/ICM/ Ascites -Cont IV Lasix and Milrinone (per Cards)-they added Metolazone today (11/18) due to poor  diuresis  -Appreciate cardiology assistance -ascites from passive hepato-congestion; Interventional radiology performed therapeutic paracentesis with 1.1 L bloody fluid removed -cont Imdur    Acute renal failure on CKD, stage IV with cardiorenal syndrome -Appreciate nephrology assistance -NOT a candidate for HD -Palliative eval in progress    Essential hypertension -BP soft and due to recent episodes of hypotension hydralazine was dc'd    Hyperlipidemia -with transaminitis will dc statin    Type 2 diabetes mellitus with peripheral neuropathy -improved control since admission but pt with poor oral intake -cont SSI -HgbA1c was 6.2 Sept 2015      Protein calorie malnutrition -Nutrition eval -suspect primary anorexia from bowel edema from CHF -pt may do better with culturally appropriate food if can get family prepare according to heart healthy requirements    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 secondary to advanced chronic kidney disease or possibly even MDS. Suspect less likely drug rash.  -Topical lotion for now -add Neosporin for excoriated wounds     DVT prophylaxis: SCDs Code Status: Full Family Communication: Spoke with son Lenn Cal about EOL issues- pt still wants aggressive treatment-discussed with son that given pt's multi comorbidities that if she should experience an arrest situation she would not survive and that pursuing resuscitation would cause more harm than good- son plans to discuss with mom alone and wih assistance of Palliative team Disposition Plan/Expected LOS: SDU   Consultants: Cardiology/ Dr. Haroldine Laws Nephrology/Dr. Posey Pronto Palliative Care/Dr. Deitra Mayo  Procedures: Therapeutic  paracentesis 11/16  IR  Antibiotics: Zosyn 11/14 vancomycin 11/14  Objective: Blood pressure 136/65, pulse 84, temperature 97.2 F (36.2 C), temperature source Oral, resp. rate 16, height 5' (1.524 m), weight 135 lb 14.4 oz (61.644 kg), SpO2 98 %.  Intake/Output Summary (Last 24 hours) at 12/25/13 1321 Last data filed at 12/25/13 1229  Gross per 24 hour  Intake 1168.36 ml  Output   1525 ml  Net -356.64 ml   Exam: Gen: No acute respiratory distress-appears chronically ill Chest: Clear to auscultation bilaterally without wheezes, rhonchi or crackles, room air Cardiac: Regular rate and rhythm, S1-S2,pansystolic murmur left sternal border fourth intercostal space, no rubs or gallops, no JVD, minimal bilateral lower extremity edema Abdomen: Soft nontender nondistended without obvious hepatosplenomegaly, no ascites Extremities: Symmetrical in appearance without cyanosis, clubbing or effusion Skin: Extensive exfoliating dermatitis without any active bullae, saline rate changes or drainage, several excoriated areas esp in antecubital region  Scheduled Meds:  Scheduled Meds: . antiseptic oral rinse  7 mL Mouth Rinse BID  . aspirin EC  81 mg Oral Daily  . atorvastatin  10 mg Oral q1800  . feeding supplement (GLUCERNA SHAKE)  237 mL Oral Daily  . furosemide  160 mg Intravenous Q6H  . hydrocerin   Topical BID  . insulin aspart  0-9 Units Subcutaneous TID WC  . isosorbide mononitrate  60 mg Oral Daily  . lubriderm seriously sensitive   Topical BID  . metolazone  2.5 mg Oral BID  . neomycin-bacitracin-polymyxin   Topical BID  . sodium chloride  10-40 mL Intracatheter Q12H  . sodium chloride  3 mL Intravenous Q12H   Continuous Infusions: . milrinone 0.125 mcg/kg/min (12/24/13 2357)    Data Reviewed: Basic Metabolic Panel:  Recent Labs Lab 12/21/13 2041 12/22/13 0525 12/23/13 0325 12/24/13 0320 12/25/13 0337  NA 133* 134* 138 135* 135*  K 5.8* 5.5* 4.9 4.2 4.1  CL 97 98 101 98  99  CO2 17* 18* 19 18* 19  GLUCOSE 163* 100* 123* 78 89  BUN 128* 128* 123* 117* 116*  CREATININE 3.45* 3.66* 3.78* 3.57* 3.24*  CALCIUM 8.1* 8.4 8.2* 8.2* 8.1*   Liver Function Tests:  Recent Labs Lab 12/21/13 2041  AST 45*  ALT 35  ALKPHOS 163*  BILITOT 0.8  PROT 7.1  ALBUMIN 2.8*   CBC:  Recent Labs Lab 12/21/13 2041 12/22/13 0525 12/23/13 0325 12/25/13 0337  WBC 3.5* 4.2 3.8* 4.3  NEUTROABS 1.7  --   --   --   HGB 7.7* 7.4* 7.0* 8.5*  HCT 23.9* 22.3* 21.0* 26.3*  MCV 101.3* 100.9* 101.0* 96.0  PLT 37* 39* 41* 58*   Cardiac Enzymes:  Recent Labs Lab 12/22/13 0525 12/22/13 1010 12/22/13 1610  TROPONINI <0.30 <0.30 <0.30   BNP (last 3 results)  Recent Labs  10/27/13 1606 10/30/13 0531 12/21/13 2041  PROBNP 16497.0* 12300.0* 15092.0*   CBG:  Recent Labs Lab 12/24/13 1547 12/24/13 1918 12/24/13 2154 12/25/13 0818 12/25/13 1221  GLUCAP 156* 114* 133* 84 161*    Recent Results (from the past 240 hour(s))  Blood culture (routine x 2)     Status: None (Preliminary result)   Collection Time: 12/21/13  9:24 PM  Result Value Ref Range Status   Specimen Description BLOOD RIGHT ARM  Final   Special Requests BOTTLES DRAWN AEROBIC AND ANAEROBIC 6CC EA  Final   Culture  Setup Time   Final    12/22/2013 19:28 Performed at [facility]  Final           BLOOD CULTURE RECEIVED NO GROWTH TO DATE CULTURE WILL BE HELD FOR 5 DAYS BEFORE ISSUING A FINAL NEGATIVE REPORT Note: Culture results may be compromised due to an excessive volume of blood received in culture bottles. Performed at [facility]    Report Status PENDING  Incomplete  Blood culture (routine x 2)     Status: None (Preliminary result)   Collection Time: 12/21/13  9:26 PM  Result Value Ref Range Status   Specimen Description BLOOD LEFT HAND  Final   Special Requests BOTTLES DRAWN AEROBIC ONLY 10CC  Final   Culture  Setup Time   Final    12/22/2013  19:27 Performed at [facility]    Culture   Final           BLOOD CULTURE RECEIVED NO GROWTH TO DATE CULTURE WILL BE HELD FOR 5 DAYS BEFORE ISSUING A FINAL NEGATIVE REPORT Note: Culture results may be compromised due to an excessive volume of blood received in culture bottles. Performed at [facility]    Report Status PENDING  Incomplete  Urine culture     Status: None   Collection Time: 12/21/13 10:06 PM  Result Value Ref Range Status   Specimen Description URINE, CATHETERIZED  Final   Special Requests NONE  Final   Culture  Setup Time   Final    12/22/2013 20:36 Performed at [place] Performed at [facility]   Final   Culture NO GROWTH Performed at [facility]   Final   Report Status 12/24/2013 FINAL  Final  MRSA PCR Screening     Status: None   Collection Time: 12/22/13  4:20 AM  Result Value Ref Range Status   MRSA by PCR NEGATIVE NEGATIVE Final    Comment:        The GeneXpert MRSA Assay (FDA approved for NASAL specimens only), is one component of a comprehensive MRSA colonization surveillance program. It is not intended to diagnose MRSA infection nor to guide or monitor treatment for MRSA infections.   Clostridium Difficile by PCR     Status: None   Collection Time: 12/23/13  1:59 AM  Result Value Ref Range Status   C difficile by pcr NEGATIVE NEGATIVE Final     Studies:  Recent x-ray studies have been reviewed in detail by the Attending Physician  Time spent :  [place], ANP [facility] Office  [phone] Pager [phone]  On-Call/Text Page:      [email or web address]      password [email or web address]  If 7PM-7AM, please contact night-coverage [email or web address] Password [email or web address] 12/25/2013, 1:21 PM   LOS: 4 days   I have personally examined this patient and reviewed the entire database. I have reviewed the above note, made any necessary editorial changes, and agree with its  content.  Cherene Altes, MD [facility]

## 2013-12-25 NOTE — Progress Notes (Signed)
Palliative Medicine Team Progress Note  [name] [name] is resting peacefully but grimaces with gentle stimulation and when I examined her skin. She appears to have severe anasarca/volume overload and a diffuse exfoliative skin rash. Per nursing she continues to interact in brief intervals, she continues to eat well and has few complaints other than skin itching and discomfort.  I spoke to her son [name] [name] over the phone to provide an update and to check in in terms of any palliative needs and to provide additional support for evolving goals of care. Her son understands how serious her condition is but is struggling with accepting her prognosis and in deciding about specific desires for interventions including his motehrs code status- he has requested to speak with her alone and I reviewed the conversation with Dr. Deitra Mayo the patient, son and the translator line. Her son tells me that she "wants all the help" she can get and that Doctors are willing to offer "if it will help her"- I explained that help can look a variety of different ways.   Her son is not ready to make a decision about her code status or discuss a shift towards comfort care-he wants to include the rest of his family but does tell me that the patient has said that she would not want "her heart restarted if it stops on its own" which would suggest her preference may be to allow a natural death to occur at the point at which that happens. She also clearly wants help and is appreciative to caregivers and staff for even the smallest gestures ie. Chocolate and coffee, bathing, skin care etc...  I made myself available to [name] [name] if he or other family would like to meet or if I could assist in further discussion about goals of care. We agreed that for now I would focus on trying to help with her uncomfortable skin rash and would check in with him daily regarding evolving goals of care/medical decision making.  Filed Vitals:   12/25/13 1100  BP:  136/65  Pulse: 84  Temp: 97.2 F (36.2 C)  Resp: 16   Grimace with movement does not awaken easily to gentle stimulation, she is under the bearhugger for warmth- she complains about being cold. Exfoliative skin rash very evident, this has been going on for months and waxes and wanes per her son- it tends to get worse after blood products are given and when she is volume overloaded. Rash is also honey crusted where scratching has occurred and her skin is reddened diffusely. 3+ edema.  Assessment:  63 yo woman with high grade MDS, aggressive with pancytopenia, transfusion dependent with thrombocytopenia and poor functional status (confined to WC) at baseline. She has progressive renal failure and would be a very poor HD candidate. Patient and family struggling with goals of care and prognosis.  Her main symptom is a severe exfoliative rash- I suspect the etiology is multifactorial and may be a manifestation of her MDS alone. Because this is so distressing to her it may be reasonable to broaden treatment to include treatment for Staphylococcal infection- this looks similar to staph scalded skin syndrome or even for secondary Impetigo. Fungal infection may also be playing a role.    Consider trial of systemic ABX to cover staph and strep for her skin  Topical emmollients   Anti-histamines  May warrant a trial of dexamethasone as well  I will follow up with son for readiness to continue goals of care conversations. She is very  fragile and I anticipate a rapid decline.   Lane Hacker, DO Palliative Medicine [phone]  Time: 5PM-5:50PM Greater than 50%  of this time was spent counseling and coordinating care related to the above assessment and plan.

## 2013-12-25 NOTE — Progress Notes (Addendum)
Advanced Heart Failure Rounding Note   Subjective:    63 yo with history of CAD and systolic HF due to ischemic cardiomyopathy with EF 30-35% andpulmonary venous hypertension, myelodysplastic syndrome, GI bleed, and CKD . Admitted with volume overload and acute on chronic renal failure.   Currently diuresing modestly with lasix 160 mg IV every 6 hours + milrinone 0.125 mcg . S/P paracentesis 1.1 liters removed. Got 2u RBCs 11/17. Weight recorded as up 4 pounds. CVP 25. Co-ox  83%  Denies dyspnea. Remains weak. Son at bedside   Objective:   Weight Range:  Vital Signs:   Temp:  [97.2 F (36.2 C)-98 F (36.7 C)] 97.2 F (36.2 C) (11/18 0821) Pulse Rate:  [62-69] 64 (11/18 0456) Resp:  [9-15] 9 (11/18 0456) BP: (113-136)/(45-65) 136/45 mmHg (11/18 0456) SpO2:  [97 %-100 %] 98 % (11/18 0456) Weight:  [61.644 kg (135 lb 14.4 oz)] 61.644 kg (135 lb 14.4 oz) (11/18 0448) Last BM Date: 12/24/13  Weight change: Filed Weights   12/23/13 0429 12/24/13 0807 12/25/13 0448  Weight: 63 kg (138 lb 14.2 oz) 59.648 kg (131 lb 8 oz) 61.644 kg (135 lb 14.4 oz)    Intake/Output:   Intake/Output Summary (Last 24 hours) at 12/25/13 0857 Last data filed at 12/25/13 0700  Gross per 24 hour  Intake  905.2 ml  Output   2025 ml  Net -1119.8 ml     Physical Exam: General:  Chronically ill appearing. No resp difficulty HEENT: normal diffuse exfoliative rash Neck: supple. JVP to jaw . Carotids 2+ bilat; no bruits. No lymphadenopathy or thryomegaly appreciated. Cor: PMI nondisplaced. Regular rate & rhythm. No rubs,  or murmurs. + S3  Lungs: clear Abdomen: soft, nontender, +distended. No hepatosplenomegaly. No bruits or masses. Good bowel sounds. Extremities: no cyanosis, clubbing, rash, edema RUE swelling infiltrate Neuro: alert & orientedx3, cranial nerves grossly intact. moves all 4 extremities w/o difficulty. Affect pleasant Skin: Dry scales all over her body. Foam dressing noted on sacrum.    Telemetry: SR   Labs: Basic Metabolic Panel:  Recent Labs Lab 12/21/13 2041 12/22/13 0525 12/23/13 0325 12/24/13 0320 12/25/13 0337  NA 133* 134* 138 135* 135*  K 5.8* 5.5* 4.9 4.2 4.1  CL 97 98 101 98 99  CO2 17* 18* 19 18* 19  GLUCOSE 163* 100* 123* 78 89  BUN 128* 128* 123* 117* 116*  CREATININE 3.45* 3.66* 3.78* 3.57* 3.24*  CALCIUM 8.1* 8.4 8.2* 8.2* 8.1*    Liver Function Tests:  Recent Labs Lab 12/21/13 2041  AST 45*  ALT 35  ALKPHOS 163*  BILITOT 0.8  PROT 7.1  ALBUMIN 2.8*   No results for input(s): LIPASE, AMYLASE in the last 168 hours. No results for input(s): AMMONIA in the last 168 hours.  CBC:  Recent Labs Lab 12/21/13 2041 12/22/13 0525 12/23/13 0325 12/25/13 0337  WBC 3.5* 4.2 3.8* 4.3  NEUTROABS 1.7  --   --   --   HGB 7.7* 7.4* 7.0* 8.5*  HCT 23.9* 22.3* 21.0* 26.3*  MCV 101.3* 100.9* 101.0* 96.0  PLT 37* 39* 41* 58*    Cardiac Enzymes:  Recent Labs Lab 12/22/13 0525 12/22/13 1010 12/22/13 1610  TROPONINI <0.30 <0.30 <0.30    BNP: BNP (last 3 results)  Recent Labs  10/27/13 1606 10/30/13 0531 12/21/13 2041  PROBNP 16497.0* 12300.0* 15092.0*     Other results:    Imaging: US Paracentesis  12/23/2013   CLINICAL DATA:  Abdominal distention, congestive heart failure,  renal insufficiency. Request therapeutic paracentesis.  EXAM: ULTRASOUND GUIDED PARACENTESIS  COMPARISON:  None.  PROCEDURE: An ultrasound guided paracentesis was thoroughly discussed with the patient and questions answered. The benefits, risks, alternatives and complications were also discussed. The patient understands and wishes to proceed with the procedure. Written consent was obtained.  Ultrasound was performed to localize and mark an adequate pocket of fluid in the right lower quadrant of the abdomen. The area was then prepped and draped in the normal sterile fashion. 1% Lidocaine was used for local anesthesia. Under ultrasound guidance a 19 gauge  Yueh catheter was introduced. Paracentesis was performed. The catheter was removed and a dressing applied.  COMPLICATIONS: None immediate  FINDINGS: A total of approximately 1.1 L of bloody ascitic fluid was removed. A fluid sample was not sent for laboratory analysis.  IMPRESSION: Successful ultrasound guided paracentesis yielding 1.1 L of ascites.  Read by: Ascencion Dike PA-C   Electronically Signed   By: Markus Daft M.D.   On: 12/23/2013 15:59   Dg Chest Port 1 View  12/24/2013   CLINICAL DATA:  Evaluate PICC line placement  EXAM: PORTABLE CHEST - 1 VIEW  COMPARISON:  12/14/2013  FINDINGS: The left arm PICC line tip is identified at the junction between the innominate vein and the SVC. The heart size is enlarged. There is atherosclerotic plaque within the aortic arch. There is no pleural effusion or edema. No airspace consolidation.  IMPRESSION: 1. Tip of the PICC line is at the junction between the innominate vein and SVC.   Electronically Signed   By: Kerby Moors M.D.   On: 12/24/2013 20:00   Dg Chest Port 1 View  12/24/2013   CLINICAL DATA:  Central line placement  EXAM: PORTABLE CHEST - 1 VIEW  COMPARISON:  12/21/2013  FINDINGS: Cardiomegaly is noted. There is left arm PICC line with tip in SVC. Tiny left apical pneumothorax. No acute infiltrate or pulmonary edema.  IMPRESSION: Left PICC line in place. No acute infiltrate or pulmonary edema. Tiny left apical pneumothorax.   Electronically Signed   By: Lahoma Crocker M.D.   On: 12/24/2013 16:43     Medications:     Scheduled Medications: . antiseptic oral rinse  7 mL Mouth Rinse BID  . aspirin EC  81 mg Oral Daily  . atorvastatin  10 mg Oral q1800  . feeding supplement (GLUCERNA SHAKE)  237 mL Oral Daily  . furosemide  160 mg Intravenous Q6H  . hydrocerin   Topical BID  . insulin aspart  0-9 Units Subcutaneous TID WC  . isosorbide mononitrate  60 mg Oral Daily  . lubriderm seriously sensitive   Topical BID  . sodium chloride  10-40 mL  Intracatheter Q12H  . sodium chloride  3 mL Intravenous Q12H    Infusions: . milrinone 0.125 mcg/kg/min (12/24/13 2357)    PRN Medications: acetaminophen **OR** acetaminophen, camphor-menthol, hydrOXYzine, ondansetron **OR** ondansetron (ZOFRAN) IV, oxyCODONE, sodium chloride, zolpidem   Assessment:  1. A/C Systolic Heart Failure. ICM EF 30-35% with cardiorenal syndrome 2. Acute on chronic renal failure Stage IV 3. DM2 4. H/O Gout 5. H/O GI bleed 10/2013  6. Diffuse scaly rash 7. MDS with pancytopenia   Plan/Discussion:    Remains on IV lasix and milrinone. Co-ox suggests adequate cardiac output but yet continues to diurese somewhat sluggishly. Renal function improving. Agree that she is not HD candidate. Will add metolazone. Repeat co-ox.  CVP = 25. I agree that we are likely getting to point  where palliative care is best option particularly in light of her hematologic malignancy.   Benay Spice 8:57 AM Advanced Heart Failure Team Pager [phone] (M-F; 7a - 4p)  Please contact [facility] for night-coverage after hours (4p -7a ) and weekends on [email or web address]  Addendum:  Remains markedly volume overloaded. Adding metolazone. I had long talk with her son at bedside and he reiterated that as long as she is not suffering and we are making some progress, they would want to continue current care. But if she begins to deteriorate they would reconsider aggressiveness of care.   Daniel Bensimhon,MD 11:02 AM

## 2013-12-25 NOTE — Telephone Encounter (Signed)
pt called to cx appt...done..pt in hospital

## 2013-12-26 ENCOUNTER — Inpatient Hospital Stay (HOSPITAL_COMMUNITY): Payer: Medicaid Other

## 2013-12-26 DIAGNOSIS — E1143 Type 2 diabetes mellitus with diabetic autonomic (poly)neuropathy: Secondary | ICD-10-CM | POA: Insufficient documentation

## 2013-12-26 DIAGNOSIS — E785 Hyperlipidemia, unspecified: Secondary | ICD-10-CM | POA: Insufficient documentation

## 2013-12-26 LAB — GLUCOSE, CAPILLARY
Glucose-Capillary: 80 mg/dL (ref 70–99)
Glucose-Capillary: 142 mg/dL — ABNORMAL HIGH (ref 70–99)
Glucose-Capillary: 206 mg/dL — ABNORMAL HIGH (ref 70–99)

## 2013-12-26 LAB — TYPE AND SCREEN
ABO/RH(D): B POS
ANTIBODY SCREEN: NEGATIVE
UNIT DIVISION: 0
Unit division: 0

## 2013-12-26 LAB — BASIC METABOLIC PANEL
ANION GAP: 15 (ref 5–15)
BUN: 112 mg/dL — ABNORMAL HIGH (ref 6–23)
CALCIUM: 8.5 mg/dL (ref 8.4–10.5)
CHLORIDE: 101 meq/L (ref 96–112)
CO2: 26 mEq/L (ref 19–32)
Creatinine, Ser: 3.01 mg/dL — ABNORMAL HIGH (ref 0.50–1.10)
GFR calc Af Amer: 18 mL/min — ABNORMAL LOW (ref >90)
GFR calc non Af Amer: 15 mL/min — ABNORMAL LOW (ref >90)
Glucose, Bld: 90 mg/dL (ref 70–99)
Potassium: 3.7 mEq/L (ref 3.7–5.3)
Sodium: 142 mEq/L (ref 137–147)

## 2013-12-26 LAB — CBC
HCT: 30.7 % — ABNORMAL LOW (ref 36.0–46.0)
HEMOGLOBIN: 10.2 g/dL — AB (ref 12.0–15.0)
MCH: 30.4 pg (ref 26.0–34.0)
MCHC: 33.2 g/dL (ref 30.0–36.0)
MCV: 91.6 fL (ref 78.0–100.0)
Platelets: 43 10*3/uL — ABNORMAL LOW (ref 150–400)
RBC: 3.35 MIL/uL — AB (ref 3.87–5.11)
RDW: 22.9 % — ABNORMAL HIGH (ref 11.5–15.5)
WBC: 5.6 10*3/uL (ref 4.0–10.5)

## 2013-12-26 LAB — CARBOXYHEMOGLOBIN
Carboxyhemoglobin: 1.7 % — ABNORMAL HIGH (ref 0.5–1.5)
Methemoglobin: 1.2 % (ref 0.0–1.5)
O2 Saturation: 90.9 %
TOTAL HEMOGLOBIN: 10.8 g/dL — AB (ref 12.0–16.0)

## 2013-12-26 MED ORDER — POTASSIUM CHLORIDE ER 10 MEQ PO TBCR
20.0000 meq | EXTENDED_RELEASE_TABLET | Freq: Two times a day (BID) | ORAL | Status: DC
  Administered 2013-12-26 – 2013-12-29 (×6): 20 meq via ORAL
  Filled 2013-12-26 (×8): qty 2

## 2013-12-26 MED ORDER — POTASSIUM CHLORIDE CRYS ER 20 MEQ PO TBCR
40.0000 meq | EXTENDED_RELEASE_TABLET | Freq: Once | ORAL | Status: AC
  Administered 2013-12-26: 40 meq via ORAL
  Filled 2013-12-26 (×2): qty 2

## 2013-12-26 MED ORDER — CEFAZOLIN SODIUM 1-5 GM-% IV SOLN
1.0000 g | Freq: Three times a day (TID) | INTRAVENOUS | Status: DC
  Administered 2013-12-26: 1 g via INTRAVENOUS
  Filled 2013-12-26 (×3): qty 50

## 2013-12-26 MED ORDER — METOLAZONE 5 MG PO TABS
5.0000 mg | ORAL_TABLET | Freq: Two times a day (BID) | ORAL | Status: DC
  Administered 2013-12-26 – 2013-12-31 (×10): 5 mg via ORAL
  Filled 2013-12-26 (×11): qty 1

## 2013-12-26 MED ORDER — CEFAZOLIN SODIUM 1-5 GM-% IV SOLN
1.0000 g | Freq: Two times a day (BID) | INTRAVENOUS | Status: DC
  Administered 2013-12-27 – 2014-01-01 (×12): 1 g via INTRAVENOUS
  Filled 2013-12-26 (×14): qty 50

## 2013-12-26 NOTE — Progress Notes (Signed)
Pts. PICC line dressing was mostly off and the PICC line site was exposed. Paged Forrest Moron, NP for an order for a STAT chest Xray and notified IV team. Chest Xray completed. IV team came and viewed the site, and redressed the site. Will continue to monitor.

## 2013-12-26 NOTE — Progress Notes (Signed)
PICC line site bleeding.  Dressing removed and site cleaned with sterile saline dampened gauze and chlorhexidine skin prep. It was noted that the PICC line had become dislodged almost 2cm from its previous placement at the beginning of the shift.  Also noted was an area of what appeared to be blistering under the antimicrobial disc that had been placed around the line at the insertion site.  The entire area of skin covered by the tegaderm dressing was reddened and tender to the touch.  The patient stated that area was very painful and burned when being cleaned.  The dressing was replaced with 2 folded sterile gauze at the insertion site, central line transparent dressing, and kerlix wrap around the arm.   IV team RN called to evaluate patient and place peripheral IV for temporary use.  Patient tolerated this procedure well.   A STAT chest x-ray confirmed the dislodgment of the PICC line, stating that it was in the innominate vein, and IV team advised for it not to be used.  IV team RN also suggested that the PICC line be replaced in radiology so that it could be sutured in place and be more secure than it had been, due to the patient's skin condition.    I spoke with cardiology PA, Ignacia Bayley, and he advised to not use the PICC line for CVP monitoring tonight and agreed that having the line replaced in radiology would be an acceptable alternative, given the condition of the patient's skin and her need for CVP monitoring and medication administration.

## 2013-12-26 NOTE — Progress Notes (Signed)
Advanced Heart Failure Rounding Note   Subjective:    63 yo with history of CAD and systolic HF due to ischemic cardiomyopathy with EF 30-35% andpulmonary venous hypertension, myelodysplastic syndrome, GI bleed, and CKD . Admitted with volume overload and acute on chronic renal failure.   Continue on lasix 160 mg IV every 6 hours + milrinone 0.125 mcg. Metolazone added yesterday as CVP still in 20s   Weight down only 1 pound. CVP 20. Co-ox  90%  Denies dyspnea. Feels cold  Objective:   Weight Range:  Vital Signs:   Temp:  [97.4 F (36.3 C)-97.8 F (36.6 C)] 97.7 F (36.5 C) (11/19 1229) Pulse Rate:  [66-90] 72 (11/19 1229) Resp:  [8-15] 15 (11/19 1229) BP: (123-155)/(51-92) 123/92 mmHg (11/19 1229) SpO2:  [94 %-99 %] 99 % (11/19 1229) Weight:  [60.918 kg (134 lb 4.8 oz)] 60.918 kg (134 lb 4.8 oz) (11/19 0248) Last BM Date: 12/25/13  Weight change: Filed Weights   12/24/13 0807 12/25/13 0448 12/26/13 0248  Weight: 59.648 kg (131 lb 8 oz) 61.644 kg (135 lb 14.4 oz) 60.918 kg (134 lb 4.8 oz)    Intake/Output:   Intake/Output Summary (Last 24 hours) at 12/26/13 1357 Last data filed at 12/26/13 1300  Gross per 24 hour  Intake  405.6 ml  Output   3775 ml  Net -3369.4 ml     Physical Exam: General:  Chronically ill appearing. No resp difficulty HEENT: normal diffuse exfoliative rash Neck: supple. JVP to jaw . Carotids 2+ bilat; no bruits. No lymphadenopathy or thryomegaly appreciated. Cor: PMI nondisplaced. Regular rate & rhythm. No rubs,  or murmurs. + S3  Lungs: clear Abdomen: soft, nontender, +distended. No hepatosplenomegaly. No bruits or masses. Good bowel sounds. Extremities: no cyanosis, clubbing, rash 3+ diffuse edema Neuro: alert & orientedx3, cranial nerves grossly intact. moves all 4 extremities w/o difficulty. Affect pleasant Skin: Dry scales all over her body. Foam dressing noted on sacrum.   Telemetry: SR   Labs: Basic Metabolic Panel:  Recent  Labs Lab 12/22/13 0525 12/23/13 0325 12/24/13 0320 12/25/13 0337 12/26/13 0600  NA 134* 138 135* 135* 142  K 5.5* 4.9 4.2 4.1 3.7  CL 98 101 98 99 101  CO2 18* 19 18* 19 26  GLUCOSE 100* 123* 78 89 90  BUN 128* 123* 117* 116* 112*  CREATININE 3.66* 3.78* 3.57* 3.24* 3.01*  CALCIUM 8.4 8.2* 8.2* 8.1* 8.5    Liver Function Tests:  Recent Labs Lab 12/21/13 2041  AST 45*  ALT 35  ALKPHOS 163*  BILITOT 0.8  PROT 7.1  ALBUMIN 2.8*   No results for input(s): LIPASE, AMYLASE in the last 168 hours. No results for input(s): AMMONIA in the last 168 hours.  CBC:  Recent Labs Lab 12/21/13 2041 12/22/13 0525 12/23/13 0325 12/25/13 0337 12/26/13 0600  WBC 3.5* 4.2 3.8* 4.3 5.6  NEUTROABS 1.7  --   --   --   --   HGB 7.7* 7.4* 7.0* 8.5* 10.2*  HCT 23.9* 22.3* 21.0* 26.3* 30.7*  MCV 101.3* 100.9* 101.0* 96.0 91.6  PLT 37* 39* 41* 58* 43*    Cardiac Enzymes:  Recent Labs Lab 12/22/13 0525 12/22/13 1010 12/22/13 1610  TROPONINI <0.30 <0.30 <0.30    BNP: BNP (last 3 results)  Recent Labs  10/27/13 1606 10/30/13 0531 12/21/13 2041  PROBNP 16497.0* 12300.0* 15092.0*     Other results:    Imaging: Dg Chest Port 1 View  12/26/2013   CLINICAL DATA:  PICC line was pulled out of position.  EXAM: PORTABLE CHEST - 1 VIEW  COMPARISON:  12/1913  FINDINGS: Left PICC line tip overlies the mid SVC region without significant change in position since the previous study. No pneumothorax. Mild cardiac enlargement without pulmonary vascular congestion. No focal airspace disease in the lungs. No blunting of costophrenic angles.  IMPRESSION: No significant change in position of PICC line, which remains projected over the mid SVC region. Cardiac enlargement.   Electronically Signed   By: Lucienne Capers M.D.   On: 12/26/2013 06:55   Dg Chest Port 1 View  12/26/2013   CLINICAL DATA:  PICC line placement.  EXAM: PORTABLE CHEST - 1 VIEW  COMPARISON:  12/24/2013  FINDINGS: Left  PICC line has been replaced or advanced. Tip projects over the mid SVC region. No pneumothorax. Mild cardiac enlargement without vascular congestion. No focal airspace disease or consolidation in the lungs. No blunting of costophrenic angles. No pneumothorax. Calcified and tortuous aorta.  IMPRESSION: Left PICC line tip overlies the mid SVC region. No pneumothorax. No evidence of active pulmonary disease. Mild cardiac enlargement.   Electronically Signed   By: Lucienne Capers M.D.   On: 12/26/2013 01:59   Dg Chest Port 1 View  12/24/2013   CLINICAL DATA:  Evaluate PICC line placement  EXAM: PORTABLE CHEST - 1 VIEW  COMPARISON:  12/14/2013  FINDINGS: The left arm PICC line tip is identified at the junction between the innominate vein and the SVC. The heart size is enlarged. There is atherosclerotic plaque within the aortic arch. There is no pleural effusion or edema. No airspace consolidation.  IMPRESSION: 1. Tip of the PICC line is at the junction between the innominate vein and SVC.   Electronically Signed   By: Kerby Moors M.D.   On: 12/24/2013 20:00   Dg Chest Port 1 View  12/24/2013   CLINICAL DATA:  Central line placement  EXAM: PORTABLE CHEST - 1 VIEW  COMPARISON:  12/21/2013  FINDINGS: Cardiomegaly is noted. There is left arm PICC line with tip in SVC. Tiny left apical pneumothorax. No acute infiltrate or pulmonary edema.  IMPRESSION: Left PICC line in place. No acute infiltrate or pulmonary edema. Tiny left apical pneumothorax.   Electronically Signed   By: Lahoma Crocker M.D.   On: 12/24/2013 16:43     Medications:     Scheduled Medications: . antiseptic oral rinse  7 mL Mouth Rinse BID  . aspirin EC  81 mg Oral Daily  .  ceFAZolin (ANCEF) IV  1 g Intravenous Q12H  . feeding supplement (GLUCERNA SHAKE)  237 mL Oral Daily  . furosemide  160 mg Intravenous Q6H  . hydrocerin   Topical BID  . insulin aspart  0-9 Units Subcutaneous TID WC  . isosorbide mononitrate  60 mg Oral Daily  .  lubriderm seriously sensitive   Topical BID  . metolazone  2.5 mg Oral BID  . neomycin-bacitracin-polymyxin   Topical BID  . sodium chloride  10-40 mL Intracatheter Q12H    Infusions: . milrinone 0.125 mcg/kg/min (12/26/13 0800)    PRN Medications: acetaminophen **OR** acetaminophen, camphor-menthol, hydrOXYzine, ondansetron **OR** ondansetron (ZOFRAN) IV, oxyCODONE, sodium chloride, zolpidem   Assessment:  1. A/C Systolic Heart Failure. ICM EF 30-35% with cardiorenal syndrome 2. Acute on chronic renal failure Stage IV 3. DM2 4. H/O Gout 5. H/O GI bleed 10/2013  6. Diffuse scaly rash 7. MDS with pancytopenia   Plan/Discussion:    Remains on IV lasix  and milrinone. Co-ox suggests adequate cardiac output. Urine output seems to be picking up. Renal function improving slowly. Remains markedly volume overloaded.  Increase metolazone to 5 bid. Place ted hose.    I had long talk with her son at bedside yesterday and he reiterated that as long as she is not suffering and we are making some progress, they would want to continue current care. But if she begins to deteriorate they would reconsider aggressiveness of care.   Joscelyn Hardrick,MD 1:57 PM

## 2013-12-26 NOTE — Progress Notes (Signed)
Pts. PICC line reinforced dressing had new bloody drainage on it. Removed the gauze around the site and the dressing was saturated in blood. Ordered an IV team consult to look at the PICC line site. Removed the dressing and saw that the line is out 5cm from 1cm. MD notified to get a Stat chest xray to verify placement. Site was cleaned and new dressing applied. Will continue to monitor.

## 2013-12-26 NOTE — Progress Notes (Signed)
Called to assess L arm PICC line site. Found PICC bleeding from under dressing. When dressing removed, PICC out to 5 cm line with no statlok at site. Original amount out was 1 cm. Estill Bamberg RN notified MD to order CXR to establish tip location after this retraction. Awaiting result.

## 2013-12-26 NOTE — Progress Notes (Signed)
Spoke with floor RN, Colletta Maryland, to notify MD about PICC. Current CXR report, PICC is in innominate vein. Recommend if replaced that pt have PICC placed per IR d/t skin condition and need for PICC to be sutured in place. Lorri Frederick, RN

## 2013-12-26 NOTE — Progress Notes (Signed)
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 On admission, patient was found to be in acute heart failure with ascites-similar to past admissions. She was also noted to have a creatinine of 3.45 and was hypothermic requiring Customer service manager. Patient placed in stepdown unit and started on IV Lasix. Noted on admission to have diffuse rash with secondary itching. Initially there was concern about sepsis and antibiotics started, discontinued the following morning as no immediate signs of infection noted  11/16 she had not been responding very well to Lasix alone. Seen by nephrology and cardiology. Creatinine progressively worsening. Through son, patient declined central line needed for IV milrinone but Cardiology was able to have PICC placed instead since she is NOT a candidate for HD. She has continued to have issues with hypothermia requiring Retail banker.  11/17 outpt notes from oncology reviewed. Prognosis documented as poor and NOT candidate for systemic treatment of high-grade myelodysplastic syndrome. Dr. Alvy Bimler recommended the option of Hospice but also felt the family at that time was not ready. She also recommended supportive care and to transfuse if Hgb </= 8.0.  HPI/Subjective: Awake, no change in itching, no abdominal pain or shortness of breath  Assessment/Plan:    Acute on chronic combined systolic and diastolic congestive heart failure/ICM/ Ascites -Cont IV Lasix and Milrinone, and metolazone (per Cards) -2400 cc out past  24 hours -Appreciate cardiology assistance -ascites from passive hepato-congestion; Interventional radiology performed therapeutic paracentesis with 1.1 L bloody fluid removed -cont Imdur    Acute renal failure on CKD, stage IV with cardiorenal syndrome -Appreciate nephrology assistance -NOT a candidate for HD -Palliative eval in progress    Essential hypertension -BP soft and due to recent episodes of hypotension hydralazine was dc'd    Hyperlipidemia -with transaminitis will dc statin    Type 2 diabetes mellitus with peripheral neuropathy -improved control since admission but pt with poor oral intake -cont SSI -HgbA1c was 6.2 Sept 2015      Protein calorie malnutrition -Nutrition following -suspect primary anorexia 2/2 bowel edema from CHF -pt may do better with culturally appropriate food if can get family prepare according to heart healthy requirements    Anemia 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) -MDS driving sx anemia -per onco OP notes rec is to transfuse for Hgb </= 8.0 -11/17 Hgb 7.0 so was transfused 2 units PRBCs each over 4 hours -11/18 hgb up to8.5 -severe MDS alone offers dire prognosis-Palliative eval in progress   Exfoliative dermatitis - ? secondary to advanced chronic kidney disease or possibly even MDS. Suspect less likely drug rash.  -Topical lotion for now -add Neosporin for excoriated wounds -Add IV Ancef as prophylaxis to prevent diffuse cellulitis     DVT prophylaxis: SCDs Code Status: Full Family Communication: No family at bedside Disposition Plan/Expected LOS: SDU   Consultants: Cardiology/ Dr. Haroldine Laws Nephrology/Dr. Posey Pronto Palliative Care/Dr. Deitra Mayo  Procedures: Therapeutic paracentesis 11/16 IR  Antibiotics: Zosyn 11/14 vancomycin 11/14  Objective: Blood pressure 155/67, pulse 67, temperature 97.4 F (36.3 C), temperature source Oral, resp. rate 10, height 5' (1.524 m), weight 134 lb 4.8 oz (60.918 kg), SpO2 98  %.  Intake/Output Summary (Last 24 hours) at 12/26/13 1154 Last data filed at 12/26/13 1045  Gross per 24 hour  Intake  603.2 ml  Output   3125 ml  Net -2521.8 ml   Exam: Gen: No acute respiratory distress-appears chronically ill-despite this remains pleasant and optimistic Chest: Clear to auscultation bilaterally without wheezes, rhonchi or crackles; somewhat diminished in the bases, room air Cardiac: Regular rate with first degree AVB, S1-S2,pansystolic murmur left sternal border fourth intercostal space, no rubs or gallops, no JVD, minimal bilateral lower extremity edema Abdomen: Soft nontender nondistended without obvious hepatosplenomegaly, no ascites Extremities: Symmetrical in appearance without cyanosis, clubbing or effusion Skin: Extensive exfoliating dermatitis without any active bullae or drainage, several excoriated areas esp in antecubital region  Scheduled Meds:  Scheduled Meds: . antiseptic oral rinse  7 mL Mouth Rinse BID  . aspirin EC  81 mg Oral Daily  .  ceFAZolin (ANCEF) IV  1 g Intravenous Q12H  . feeding supplement (GLUCERNA SHAKE)  237 mL Oral Daily  . furosemide  160 mg Intravenous Q6H  . hydrocerin   Topical BID  . insulin aspart  0-9 Units Subcutaneous TID WC  . isosorbide mononitrate  60 mg Oral Daily  . lubriderm seriously sensitive   Topical BID  . metolazone  2.5 mg Oral BID  . neomycin-bacitracin-polymyxin   Topical BID  . potassium chloride  40 mEq Oral Once  . sodium chloride  10-40 mL Intracatheter Q12H   Continuous Infusions: . milrinone 0.125 mcg/kg/min (12/26/13 0800)    Data Reviewed: Basic Metabolic Panel:  Recent Labs Lab 12/22/13 0525 12/23/13 0325 12/24/13 0320 12/25/13 0337 12/26/13 0600  NA 134* 138 135* 135* 142  K 5.5* 4.9 4.2 4.1 3.7  CL 98 101 98 99 101  CO2 18* 19 18* 19 26  GLUCOSE 100* 123* 78 89 90  BUN 128* 123* 117* 116* 112*  CREATININE 3.66* 3.78* 3.57* 3.24* 3.01*  CALCIUM 8.4 8.2* 8.2* 8.1* 8.5   Liver  Function Tests:  Recent Labs Lab 12/21/13 2041  AST 45*  ALT 35  ALKPHOS 163*  BILITOT 0.8  PROT 7.1  ALBUMIN 2.8*   CBC:  Recent Labs Lab 12/21/13 2041 12/22/13 0525 12/23/13 0325 12/25/13 0337 12/26/13 0600  WBC 3.5* 4.2 3.8* 4.3 5.6  NEUTROABS 1.7  --   --   --   --   HGB 7.7* 7.4* 7.0* 8.5* 10.2*  HCT 23.9* 22.3* 21.0* 26.3* 30.7*  MCV 101.3* 100.9* 101.0* 96.0 91.6  PLT 37* 39* 41* 58* 43*   Cardiac Enzymes:  Recent Labs Lab 12/22/13 0525 12/22/13 1010 12/22/13 1610  TROPONINI <0.30 <0.30 <0.30   BNP (last 3 results)  Recent Labs  10/27/13 1606 10/30/13 0531 12/21/13 2041  PROBNP 16497.0* 12300.0* 15092.0*   CBG:  Recent Labs Lab 12/24/13 2154 12/25/13 0818 12/25/13 1221 12/25/13 2145 12/26/13 0735  GLUCAP 133* 84 161* 167* 80    Recent Results (from the past 240 hour(s))  Blood culture (routine x 2)     Status: None (Preliminary result)   Collection Time: 12/21/13  9:24 PM  Result Value Ref Range Status   Specimen Description BLOOD RIGHT ARM  Final   Special Requests BOTTLES DRAWN AEROBIC AND ANAEROBIC 6CC EA  Final   Culture  Setup Time   Final    12/22/2013 19:28 Performed at [facility]    Culture   Final           BLOOD CULTURE RECEIVED NO GROWTH  TO DATE CULTURE WILL BE HELD FOR 5 DAYS BEFORE ISSUING A FINAL NEGATIVE REPORT Note: Culture results may be compromised due to an excessive volume of blood received in culture bottles. Performed at [facility]    Report Status PENDING  Incomplete  Blood culture (routine x 2)     Status: None (Preliminary result)   Collection Time: 12/21/13  9:26 PM  Result Value Ref Range Status   Specimen Description BLOOD LEFT HAND  Final   Special Requests BOTTLES DRAWN AEROBIC ONLY 10CC  Final   Culture  Setup Time   Final    12/22/2013 19:27 Performed at [facility]    Culture   Final           BLOOD CULTURE RECEIVED NO GROWTH TO DATE CULTURE WILL BE HELD FOR 5 DAYS  BEFORE ISSUING A FINAL NEGATIVE REPORT Note: Culture results may be compromised due to an excessive volume of blood received in culture bottles. Performed at [facility]    Report Status PENDING  Incomplete  Urine culture     Status: None   Collection Time: 12/21/13 10:06 PM  Result Value Ref Range Status   Specimen Description URINE, CATHETERIZED  Final   Special Requests NONE  Final   Culture  Setup Time   Final    12/22/2013 20:36 Performed at [facility] Performed at [facility]   Final   Culture NO GROWTH Performed at [facility]   Final   Report Status 12/24/2013 FINAL  Final  MRSA PCR Screening     Status: None   Collection Time: 12/22/13  4:20 AM  Result Value Ref Range Status   MRSA by PCR NEGATIVE NEGATIVE Final    Comment:        The GeneXpert MRSA Assay (FDA approved for NASAL specimens only), is one component of a comprehensive MRSA colonization surveillance program. It is not intended to diagnose MRSA infection nor to guide or monitor treatment for MRSA infections.   Clostridium Difficile by PCR     Status: None   Collection Time: 12/23/13  1:59 AM  Result Value Ref Range Status   C difficile by pcr NEGATIVE NEGATIVE Final     Studies:  Recent x-ray studies have been reviewed in detail by the Attending Physician  Time spent :  [place], ANP [facility] Office  [phone] Pager [phone]  On-Call/Text Page:      [email or web address]      password [email or web address]  If 7PM-7AM, please contact night-coverage [email or web address] Password [email or web address] 12/26/2013, 11:54 AM   LOS: 5 days   Examined patient and discussed assessment and plan with ANP Ebony Hail and agree with above. Patient with multiple complex medical problems> 40 minutes spent on direct patient care

## 2013-12-26 NOTE — Progress Notes (Addendum)
Note: RN reported to this NP that the PICC keeps "moving" because the pt has a skin condition that precludes tape on the skin. This is interfering with correct CVP readings as well. These were ordered by cardio. The PICC is not being used for any other purpose other than the CVPs and labs. Apparently, this has been an issue for a couple of days and day RNs haven't spoken to day MDs about this.  This NP was going to suture down the present PICC line and mistakenly cut the line. Line removed and pressure held until hemostasis achieved. Area wrapped with dsg without tape on the skin. IV team RN to room and verified that the entire PICC (per measurement in chart) was removed and tip was present.  This NP spoke with cardiology. Fellow on call stated it was OK to not have CVP measurement tonight as long as BP was stable and pt was making urine. This was communicated to the RN and she will call if there is an issue.  If pt needs CVP measurements restarted then line will have to be sutured in by IR or pt needs a different type of access. [name] Boll, NP [facility] Update: Pt's labs have not been drawn this am due to lost PICC. Order placed to have IR place PICC today and labs can be obtained after that. Will defer true need for this PICC to attending and cardiology this am. Perhaps a different solution can be found. If gets PICC, it needs to be sewn in due to pt's skin disorder that prevents using tape or adhesive materials to secure. Pt has had 1400cc of urinary output on night shift and BP has been very stable.  KJKG, NP

## 2013-12-27 ENCOUNTER — Inpatient Hospital Stay (HOSPITAL_COMMUNITY): Payer: Medicaid Other

## 2013-12-27 LAB — BASIC METABOLIC PANEL
ANION GAP: 15 (ref 5–15)
Anion gap: 16 — ABNORMAL HIGH (ref 5–15)
BUN: 109 mg/dL — AB (ref 6–23)
BUN: 103 mg/dL — ABNORMAL HIGH (ref 6–23)
CHLORIDE: 98 meq/L (ref 96–112)
CHLORIDE: 98 meq/L (ref 96–112)
CO2: 24 mEq/L (ref 19–32)
CO2: 24 meq/L (ref 19–32)
CREATININE: 2.98 mg/dL — AB (ref 0.50–1.10)
Calcium: 8.2 mg/dL — ABNORMAL LOW (ref 8.4–10.5)
Calcium: 8.2 mg/dL — ABNORMAL LOW (ref 8.4–10.5)
Creatinine, Ser: 2.89 mg/dL — ABNORMAL HIGH (ref 0.50–1.10)
GFR calc Af Amer: 18 mL/min — ABNORMAL LOW (ref >90)
GFR calc non Af Amer: 16 mL/min — ABNORMAL LOW (ref >90)
GFR, EST AFRICAN AMERICAN: 19 mL/min — AB (ref >90)
GFR, EST NON AFRICAN AMERICAN: 16 mL/min — AB (ref >90)
Glucose, Bld: 120 mg/dL — ABNORMAL HIGH (ref 70–99)
Glucose, Bld: 168 mg/dL — ABNORMAL HIGH (ref 70–99)
POTASSIUM: 4.6 meq/L (ref 3.7–5.3)
Potassium: 4.1 mEq/L (ref 3.7–5.3)
SODIUM: 138 meq/L (ref 137–147)
Sodium: 137 mEq/L (ref 137–147)

## 2013-12-27 LAB — GLUCOSE, CAPILLARY
GLUCOSE-CAPILLARY: 71 mg/dL (ref 70–99)
GLUCOSE-CAPILLARY: 173 mg/dL — AB (ref 70–99)
GLUCOSE-CAPILLARY: 129 mg/dL — AB (ref 70–99)
Glucose-Capillary: 162 mg/dL — ABNORMAL HIGH (ref 70–99)
Glucose-Capillary: 45 mg/dL — ABNORMAL LOW (ref 70–99)
Glucose-Capillary: 169 mg/dL — ABNORMAL HIGH (ref 70–99)

## 2013-12-27 MED ORDER — DEXTROSE 50 % IV SOLN
INTRAVENOUS | Status: AC
  Administered 2013-12-27: 50 mL
  Filled 2013-12-27: qty 50

## 2013-12-27 MED ORDER — ASPIRIN EC 81 MG PO TBEC
81.0000 mg | DELAYED_RELEASE_TABLET | Freq: Every day | ORAL | Status: DC
  Administered 2013-12-27 – 2014-01-10 (×15): 81 mg via ORAL
  Filled 2013-12-27 (×15): qty 1

## 2013-12-27 MED ORDER — CHLORHEXIDINE GLUCONATE 4 % EX LIQD
CUTANEOUS | Status: AC
  Filled 2013-12-27: qty 15

## 2013-12-27 MED ORDER — LIDOCAINE HCL 1 % IJ SOLN
INTRAMUSCULAR | Status: AC
  Filled 2013-12-27: qty 20

## 2013-12-27 NOTE — Progress Notes (Signed)
Patient ID: [name] [name], female   DOB: 13-Mar-1950, 63 y.o.   MRN: [id number] Advanced Heart Failure Rounding Note   Subjective:    63 yo with history of CAD and systolic HF due to ischemic cardiomyopathy with EF 30-35% andpulmonary venous hypertension, myelodysplastic syndrome, GI bleed, and CKD . Admitted with volume overload and acute on chronic renal failure.   Continues on lasix 160 mg IV every 6 hours + milrinone 0.125 mcg. Metolazone increased to 5 mg bid yesterday.  PICC fell out overnight but JVP still very high. Good diuresis, weight down 4 lbs.   Denies dyspnea. Feels cold  Objective:   Weight Range:  Vital Signs:   Temp:  [97.5 F (36.4 C)-98.6 F (37 C)] 98.3 F (36.8 C) (11/20 0800) Pulse Rate:  [72-96] 94 (11/20 0441) Resp:  [14-19] 15 (11/20 0441) BP: (123-140)/(52-92) 130/63 mmHg (11/20 0441) SpO2:  [99 %-100 %] 100 % (11/20 0441) Weight:  [130 lb 11.2 oz (59.285 kg)] 130 lb 11.2 oz (59.285 kg) (11/20 0441) Last BM Date: 12/26/13  Weight change: Filed Weights   12/25/13 0448 12/26/13 0248 12/27/13 0441  Weight: 135 lb 14.4 oz (61.644 kg) 134 lb 4.8 oz (60.918 kg) 130 lb 11.2 oz (59.285 kg)    Intake/Output:   Intake/Output Summary (Last 24 hours) at 12/27/13 1035 Last data filed at 12/27/13 0700  Gross per 24 hour  Intake 388.84 ml  Output   2450 ml  Net -2061.16 ml     Physical Exam: General:  Chronically ill appearing. No resp difficulty HEENT: normal diffuse exfoliative rash Neck: supple. JVP to jaw . Carotids 2+ bilat; no bruits. No lymphadenopathy or thryomegaly appreciated. Cor: PMI nondisplaced. Regular rate & rhythm. No rubs,  or murmurs. + S3  Lungs: clear Abdomen: soft, nontender, +distended. No hepatosplenomegaly. No bruits or masses. Good bowel sounds. Extremities: no cyanosis, clubbing, rash 3+ diffuse edema Neuro: alert & orientedx3, cranial nerves grossly intact. moves all 4 extremities w/o difficulty. Affect pleasant Skin: Dry scales all  over her body. Foam dressing noted on sacrum.   Telemetry: SR   Labs: Basic Metabolic Panel:  Recent Labs Lab 12/23/13 0325 12/24/13 0320 12/25/13 0337 12/26/13 0600 12/27/13 0259  NA 138 135* 135* 142 138  K 4.9 4.2 4.1 3.7 4.6  CL 101 98 99 101 98  CO2 19 18* 19 26 24   GLUCOSE 123* 78 89 90 120*  BUN 123* 117* 116* 112* 109*  CREATININE 3.78* 3.57* 3.24* 3.01* 2.89*  CALCIUM 8.2* 8.2* 8.1* 8.5 8.2*    Liver Function Tests:  Recent Labs Lab 12/21/13 2041  AST 45*  ALT 35  ALKPHOS 163*  BILITOT 0.8  PROT 7.1  ALBUMIN 2.8*   No results for input(s): LIPASE, AMYLASE in the last 168 hours. No results for input(s): AMMONIA in the last 168 hours.  CBC:  Recent Labs Lab 12/21/13 2041 12/22/13 0525 12/23/13 0325 12/25/13 0337 12/26/13 0600  WBC 3.5* 4.2 3.8* 4.3 5.6  NEUTROABS 1.7  --   --   --   --   HGB 7.7* 7.4* 7.0* 8.5* 10.2*  HCT 23.9* 22.3* 21.0* 26.3* 30.7*  MCV 101.3* 100.9* 101.0* 96.0 91.6  PLT 37* 39* 41* 58* 43*    Cardiac Enzymes:  Recent Labs Lab 12/22/13 0525 12/22/13 1010 12/22/13 1610  TROPONINI <0.30 <0.30 <0.30    BNP: BNP (last 3 results)  Recent Labs  10/27/13 1606 10/30/13 0531 12/21/13 2041  PROBNP 16497.0* 12300.0* 15092.0*  Other results:    Imaging: Dg Chest Port 1 View  12/26/2013   CLINICAL DATA:  Bleeding from around PICC line  EXAM: PORTABLE CHEST - 1 VIEW  COMPARISON:  12/26/2013  FINDINGS: Cardiac shadow remains enlarged. The left-sided PICC line has been withdrawn slightly in the interval and now lies at the junction of the innominate veins. If the PICC line has a tapered back and this may be a the etiology of the recent hemorrhage surrounding the catheter. The lungs are clear. No bony abnormality is noted.  IMPRESSION: Slight withdrawal of the PICC line as described above.   Electronically Signed   By: Inez Catalina M.D.   On: 12/26/2013 19:20   Dg Chest Port 1 View  12/26/2013   CLINICAL DATA:   PICC line was pulled out of position.  EXAM: PORTABLE CHEST - 1 VIEW  COMPARISON:  12/1913  FINDINGS: Left PICC line tip overlies the mid SVC region without significant change in position since the previous study. No pneumothorax. Mild cardiac enlargement without pulmonary vascular congestion. No focal airspace disease in the lungs. No blunting of costophrenic angles.  IMPRESSION: No significant change in position of PICC line, which remains projected over the mid SVC region. Cardiac enlargement.   Electronically Signed   By: Lucienne Capers M.D.   On: 12/26/2013 06:55   Dg Chest Port 1 View  12/26/2013   CLINICAL DATA:  PICC line placement.  EXAM: PORTABLE CHEST - 1 VIEW  COMPARISON:  12/24/2013  FINDINGS: Left PICC line has been replaced or advanced. Tip projects over the mid SVC region. No pneumothorax. Mild cardiac enlargement without vascular congestion. No focal airspace disease or consolidation in the lungs. No blunting of costophrenic angles. No pneumothorax. Calcified and tortuous aorta.  IMPRESSION: Left PICC line tip overlies the mid SVC region. No pneumothorax. No evidence of active pulmonary disease. Mild cardiac enlargement.   Electronically Signed   By: Lucienne Capers M.D.   On: 12/26/2013 01:59     Medications:     Scheduled Medications: . antiseptic oral rinse  7 mL Mouth Rinse BID  . aspirin EC  81 mg Oral Daily  .  ceFAZolin (ANCEF) IV  1 g Intravenous Q12H  . feeding supplement (GLUCERNA SHAKE)  237 mL Oral Daily  . furosemide  160 mg Intravenous Q6H  . hydrocerin   Topical BID  . insulin aspart  0-9 Units Subcutaneous TID WC  . isosorbide mononitrate  60 mg Oral Daily  . lubriderm seriously sensitive   Topical BID  . metolazone  5 mg Oral BID  . neomycin-bacitracin-polymyxin   Topical BID  . potassium chloride  20 mEq Oral BID  . sodium chloride  10-40 mL Intracatheter Q12H    Infusions: . milrinone 0.125 mcg/kg/min (12/27/13 0400)    PRN  Medications: acetaminophen **OR** acetaminophen, camphor-menthol, hydrOXYzine, ondansetron **OR** ondansetron (ZOFRAN) IV, oxyCODONE, sodium chloride, zolpidem   Assessment:  1. A/C Systolic Heart Failure. Ischemic cardiomyopathy, EF 30-35% with cardiorenal syndrome 2. Acute on chronic renal failure Stage IV: Not a good HD candidate.  3. DM2 4. H/O Gout 5. H/O GI bleed 10/2013  6. Diffuse scaly rash 7. MDS with pancytopenia   Plan/Discussion:    Remains on IV lasix, metolazone, and milrinone @ 0.125. Co-ox has suggested adequate cardiac output, unable to get today due to PICC falling out. On exam, she is still very volume overloaded. Unable to measure CVP without PICC.  Will replace PICC today.    BUN/creatinine  is gradually coming down.  Follow closely with diuresis.   Discussion with son earlier this wee: He understands that we are making some progress, and they would want to continue current care. But if she begins to deteriorate they would reconsider aggressiveness of care.   Dashawn Golda,MD 10:35 AM

## 2013-12-27 NOTE — Progress Notes (Signed)
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 On admission, patient was found to be in acute heart failure with ascites-similar to past admissions. She was also noted to have a creatinine of 3.45 and was hypothermic requiring Customer service manager. Patient placed in stepdown unit and started on IV Lasix. Noted on admission to have diffuse rash with secondary itching. Initially there was concern about sepsis and antibiotics started, discontinued the following morning as no immediate signs of infection noted  11/16 she had not been responding very well to Lasix alone. Seen by nephrology and cardiology. Creatinine progressively worsening. Through son, patient declined central line needed for IV milrinone but Cardiology was able to have PICC placed instead since she is NOT a candidate for HD. She has continued to have issues with hypothermia requiring Retail banker.  11/17 outpt notes from oncology reviewed. Prognosis documented as poor and NOT candidate for systemic treatment of high-grade myelodysplastic syndrome. Dr. Alvy Bimler recommended the option of Hospice but also felt the family at that time was not ready. She also recommended supportive care and to transfuse if Hgb </= 8.0.  HPI/Subjective: Awake-seems to report less itching- tolerating diet  Assessment/Plan:    Acute on chronic combined systolic and diastolic congestive heart failure/ICM/ Ascites -Cont IV Lasix and Milrinone, and metolazone (per Cards) -3200 cc out past 24  hours -Appreciate cardiology assistance -unable to measure CVP since PICC line unexpectently removed due to problems -ascites from passive hepato-congestion; Interventional radiology performed therapeutic paracentesis with 1.1 L bloody fluid removed -cont Imdur    Acute renal failure on CKD, stage IV with cardiorenal syndrome -Appreciate nephrology assistance -Scr slowly trending down -NOT a candidate for HD -Palliative eval in progress    Essential hypertension -BP soft and due to recent episodes of hypotension hydralazine was dc'd -BP more stable and tolerating diuresis    Hyperlipidemia -due to transaminitis statin was dc'd    Type 2 diabetes mellitus with peripheral neuropathy -improved control since admission; note subtle increases in CBG with improved intake -cont SSI -HgbA1c was 6.2 Sept 2015      Protein calorie malnutrition -Nutrition following -suspect primary anorexia 2/2 bowel edema from CHF -still exploring/encouraging culturally appropriate food if can get family prepare according to heart healthy requirements    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 secondary to advanced chronic kidney disease or possibly even MDS. Suspect less likely drug rash.  -Topical lotion for now -Neosporin for excoriated wounds -Add IV Ancef as prophylaxis to prevent diffuse cellulitis     DVT prophylaxis: SCDs Code Status: Full Family Communication: No family at bedside Disposition Plan/Expected LOS: SDU   Consultants: Cardiology/ Dr. Haroldine Laws Nephrology/Dr. Posey Pronto Palliative Care/Dr. Deitra Mayo  Procedures: Therapeutic paracentesis  11/16 IR  Antibiotics: Zosyn 11/14 - d/c vancomycin 11/14 - d/c  Ancef 11/20  Objective: Blood pressure 130/63, pulse 94, temperature 98.3 F (36.8 C), temperature source Oral, resp. rate 15, height 5' (1.524 m), weight 130 lb 11.2 oz (59.285 kg), SpO2 100 %.  Intake/Output Summary (Last 24 hours) at 12/27/13 1228 Last data filed at 12/27/13 0700  Gross per 24 hour  Intake 354.04 ml  Output   2450 ml  Net -2095.96 ml   Exam: Gen: No acute respiratory distress-still  pleasant and optimistic Chest: Clear to auscultation bilaterally without wheezes, rhonchi or crackles; somewhat diminished in the bases, room air Cardiac: Regular rate and rhythm, W0-J8, pansystolic murmur left sternal border fourth intercostal space, no rubs or gallops, ?? JVD, minimal bilateral lower extremity edema Abdomen: Soft nontender nondistended without obvious hepatosplenomegaly, no ascites Extremities: Symmetrical in appearance without cyanosis, clubbing or effusion Skin: Extensive exfoliating dermatitis without any active bullae or drainage, several excoriated areas esp in antecubital region  Scheduled Meds:  Scheduled Meds: . antiseptic oral rinse  7 mL Mouth Rinse BID  . aspirin EC  81 mg Oral Daily  .  ceFAZolin (ANCEF) IV  1 g Intravenous Q12H  . feeding supplement (GLUCERNA SHAKE)  237 mL Oral Daily  . furosemide  160 mg Intravenous Q6H  . hydrocerin   Topical BID  . insulin aspart  0-9 Units Subcutaneous TID WC  . isosorbide mononitrate  60 mg Oral Daily  . lubriderm seriously sensitive   Topical BID  . metolazone  5 mg Oral BID  . neomycin-bacitracin-polymyxin   Topical BID  . potassium chloride  20 mEq Oral BID  . sodium chloride  10-40 mL Intracatheter Q12H   Continuous Infusions: . milrinone 0.125 mcg/kg/min (12/27/13 0400)    Data Reviewed: Basic Metabolic Panel:  Recent Labs Lab 12/23/13 0325 12/24/13 0320 12/25/13 0337 12/26/13 0600 12/27/13 0259  NA 138 135* 135* 142 138  K  4.9 4.2 4.1 3.7 4.6  CL 101 98 99 101 98  CO2 19 18* 19 26 24   GLUCOSE 123* 78 89 90 120*  BUN 123* 117* 116* 112* 109*  CREATININE 3.78* 3.57* 3.24* 3.01* 2.89*  CALCIUM 8.2* 8.2* 8.1* 8.5 8.2*   Liver Function Tests:  Recent Labs Lab 12/21/13 2041  AST 45*  ALT 35  ALKPHOS 163*  BILITOT 0.8  PROT 7.1  ALBUMIN 2.8*   CBC:  Recent Labs Lab 12/21/13 2041 12/22/13 0525 12/23/13 0325 12/25/13 0337 12/26/13 0600  WBC 3.5* 4.2 3.8* 4.3 5.6  NEUTROABS 1.7  --   --   --   --   HGB 7.7* 7.4* 7.0* 8.5* 10.2*  HCT 23.9* 22.3* 21.0* 26.3* 30.7*  MCV 101.3* 100.9* 101.0* 96.0 91.6  PLT 37* 39* 41* 58* 43*   Cardiac Enzymes:  Recent Labs Lab 12/22/13 0525 12/22/13 1010 12/22/13 1610  TROPONINI <0.30 <0.30 <0.30   BNP (last 3 results)  Recent Labs  10/27/13 1606 10/30/13 0531 12/21/13 2041  PROBNP 16497.0* 12300.0* 15092.0*   CBG:  Recent Labs Lab 12/26/13 0735 12/26/13 1656 12/26/13 2302 12/27/13 0844 12/27/13 1142  GLUCAP 80 142* 206* 71 173*    Recent Results (from the past 240 hour(s))  Blood culture (routine x 2)     Status: None (Preliminary result)   Collection Time: 12/21/13  9:24 PM  Result Value Ref Range Status   Specimen Description BLOOD RIGHT ARM  Final   Special Requests BOTTLES DRAWN AEROBIC AND ANAEROBIC 6CC EA  Final   Culture  Setup Time   Final  12/22/2013 19:28 Performed at [facility]    Culture   Final           BLOOD CULTURE RECEIVED NO GROWTH TO DATE CULTURE WILL BE HELD FOR 5 DAYS BEFORE ISSUING A FINAL NEGATIVE REPORT Note: Culture results may be compromised due to an excessive volume of blood received in culture bottles. Performed at [facility]    Report Status PENDING  Incomplete  Blood culture (routine x 2)     Status: None (Preliminary result)   Collection Time: 12/21/13  9:26 PM  Result Value Ref Range Status   Specimen Description BLOOD LEFT HAND  Final   Special Requests BOTTLES DRAWN  AEROBIC ONLY 10CC  Final   Culture  Setup Time   Final    12/22/2013 19:27 Performed at [facility]    Culture   Final           BLOOD CULTURE RECEIVED NO GROWTH TO DATE CULTURE WILL BE HELD FOR 5 DAYS BEFORE ISSUING A FINAL NEGATIVE REPORT Note: Culture results may be compromised due to an excessive volume of blood received in culture bottles. Performed at [facility]    Report Status PENDING  Incomplete  Urine culture     Status: None   Collection Time: 12/21/13 10:06 PM  Result Value Ref Range Status   Specimen Description URINE, CATHETERIZED  Final   Special Requests NONE  Final   Culture  Setup Time   Final    12/22/2013 20:36 Performed at [place] Performed at [facility]   Final   Culture NO GROWTH Performed at [facility]   Final   Report Status 12/24/2013 FINAL  Final  MRSA PCR Screening     Status: None   Collection Time: 12/22/13  4:20 AM  Result Value Ref Range Status   MRSA by PCR NEGATIVE NEGATIVE Final    Comment:        The GeneXpert MRSA Assay (FDA approved for NASAL specimens only), is one component of a comprehensive MRSA colonization surveillance program. It is not intended to diagnose MRSA infection nor to guide or monitor treatment for MRSA infections.   Clostridium Difficile by PCR     Status: None   Collection Time: 12/23/13  1:59 AM  Result Value Ref Range Status   C difficile by pcr NEGATIVE NEGATIVE Final     Studies:  Recent x-ray studies have been reviewed in detail by the Attending Physician  Time spent :  [place], ANP [facility] Office  [phone] Pager [phone]  On-Call/Text Page:      [email or web address]      password [email or web address]  If 7PM-7AM, please contact night-coverage [email or web address] Password [email or web address] 12/27/2013, 12:28 PM   LOS: 6 days    Examined patient and discussed assessment and plan with ANP Ebony Hail and agree with  above. Patient with multiple complex medical problems > 30 minutes spent on direct patient care  Gilles Chiquito, MD

## 2013-12-27 NOTE — Progress Notes (Signed)
No Iv access for cvp monitoring prior to PICC placement. Oncoming shift made aware and will follow up.

## 2013-12-27 NOTE — Procedures (Signed)
Procedure:  Right IJ tunneled central line Access:  Right IJ vein Findings:  20 cm DL Power Line placed with tip at cavoatrial junction.  OK to use.

## 2013-12-27 NOTE — Progress Notes (Signed)
To xray for PICC placement.

## 2013-12-27 NOTE — Progress Notes (Signed)
Respiratory therapist notified of need for CVP set up

## 2013-12-27 NOTE — Consult Note (Addendum)
Requested [place] nurse for possible options for securement of her PICC line. She has recently had the PICC line removed but may need another one.  I have left a sample of a securement device to trial.   Pt has diffuse exfoliative dermatitis, unclear etiology.  Medical team does not relate to drug allergy.  Her upper extremities and face are affected which has made adherence of any dressing problematic.  Problematic to suture PICC line in. CCM managing with topical lotion and neosporin for the open areas on her arms, ears, and face.   WOC will follow up next week for any other needs.   Stoney Karczewski [place] RN,CWOCN [phone]

## 2013-12-28 LAB — BASIC METABOLIC PANEL
Anion gap: 15 (ref 5–15)
BUN: 101 mg/dL — AB (ref 6–23)
CALCIUM: 8.1 mg/dL — AB (ref 8.4–10.5)
CO2: 27 mEq/L (ref 19–32)
Chloride: 97 mEq/L (ref 96–112)
Creatinine, Ser: 2.79 mg/dL — ABNORMAL HIGH (ref 0.50–1.10)
GFR calc Af Amer: 20 mL/min — ABNORMAL LOW (ref >90)
GFR, EST NON AFRICAN AMERICAN: 17 mL/min — AB (ref >90)
GLUCOSE: 106 mg/dL — AB (ref 70–99)
Potassium: 4 mEq/L (ref 3.7–5.3)
Sodium: 139 mEq/L (ref 137–147)

## 2013-12-28 LAB — CARBOXYHEMOGLOBIN
CARBOXYHEMOGLOBIN: 2.1 % — AB (ref 0.5–1.5)
Methemoglobin: 1 % (ref 0.0–1.5)
O2 Saturation: 73.9 %
Total hemoglobin: 8.7 g/dL — ABNORMAL LOW (ref 12.0–16.0)

## 2013-12-28 LAB — CBC
HEMATOCRIT: 27.1 % — AB (ref 36.0–46.0)
HEMOGLOBIN: 9 g/dL — AB (ref 12.0–15.0)
MCH: 31.9 pg (ref 26.0–34.0)
MCHC: 33.2 g/dL (ref 30.0–36.0)
MCV: 96.1 fL (ref 78.0–100.0)
Platelets: 49 10*3/uL — ABNORMAL LOW (ref 150–400)
RBC: 2.82 MIL/uL — ABNORMAL LOW (ref 3.87–5.11)
RDW: 22 % — ABNORMAL HIGH (ref 11.5–15.5)
WBC: 5.8 10*3/uL (ref 4.0–10.5)

## 2013-12-28 LAB — CULTURE, BLOOD (ROUTINE X 2)
CULTURE: NO GROWTH
Culture: NO GROWTH

## 2013-12-28 LAB — GLUCOSE, CAPILLARY
Glucose-Capillary: 97 mg/dL (ref 70–99)
Glucose-Capillary: 264 mg/dL — ABNORMAL HIGH (ref 70–99)
Glucose-Capillary: 212 mg/dL — ABNORMAL HIGH (ref 70–99)
Glucose-Capillary: 153 mg/dL — ABNORMAL HIGH (ref 70–99)

## 2013-12-28 NOTE — Progress Notes (Signed)
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 On admission, patient was found to be in acute heart failure with ascites-similar to past admissions. She was also noted to have a creatinine of 3.45 and was hypothermic requiring Customer service manager. Patient placed in stepdown unit and started on IV Lasix. Noted on admission to have diffuse rash with secondary itching. Initially there was concern about sepsis and antibiotics started, discontinued the following morning as no immediate signs of infection noted  11/16 she had not been responding very well to Lasix alone. Seen by nephrology and cardiology. Creatinine progressively worsening. Through son, patient declined central line needed for IV milrinone but Cardiology was able to have PICC placed instead since she is NOT a candidate for HD. She has continued to have issues with hypothermia requiring Customer service manager.  11/17 outpt notes from oncology reviewed. Prognosis documented as poor and NOT candidate for systemic treatment of high-grade myelodysplastic syndrome. Dr. Alvy Bimler recommended the option of Hospice but also felt the family at that time was not ready. She also recommended supportive care and to transfuse if Hgb </= 8.0.  HPI/Subjective: Awake, continues to be cold and has some itching.  Right thigh and hip are hurting, possibly from lying in bed.   Assessment/Plan:  Acute on chronic combined systolic and diastolic congestive heart failure/ICM/ Ascites - Cont IV Lasix and Milrinone, and  metolazone (per Cards) - 2325 cc out past 24 hours - Power PICC replaced yesterday, CVP 16, reviewed with Dr. Haroldine Laws in room - ascites from passive hepato-congestion, somewhat improved.  Has had IR guided paracentesis while in house - cont Imdur - Cardiology following  Acute renal failure on CKD, stage IV with cardiorenal syndrome - Nephrology signed off for now - Scr slowly trending down, today 2.79 down from 2.98 (likely stable, ? New baseline)  - Per chart review, she is NOT a candidate for HD - Palliative eval in progress, reviewed Dr. Delanna Ahmadi most recent note.   Essential hypertension - well controlled with systolics in the 841L-244W.  - On lasix, IMDUR, metolazone - Home hydralazine held b/c of previous soft BPs.  If her BP starts to rise, would consider adding back.   Hyperlipidemia - reported transaminitis, so statin d/c'd - Review of CMETs show only a mild elevation in AST on 11/14, but otherwise normal.  - Would review with Cardiology if appropriate to restart  Type 2 diabetes mellitus with peripheral neuropathy - improved control since admission; CBG range from 90s-170s - cont SSI    Protein calorie malnutrition - Nutrition following - appears to be related to primary anorexia 2/2 bowel edema from CHF - She ate better today, enjoyed pancakes - Family to explore bringing in patient's favorite foods, if they can ben made in a heart healthy way.   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  Check every few days -  severe MDS alone makes for a poor prognosis, palliative team is following  Exfoliative dermatitis - ? secondary to advanced chronic kidney disease or possibly even MDS.  -Topical lotion for now - Neosporin for  excoriated wounds - IV Ancef as prophylaxis to prevent diffuse cellulitis     DVT prophylaxis: SCDs Code Status: Full Family Communication: No family at bedside Disposition Plan/Expected LOS: SDU   Consultants: Cardiology/ Dr. Haroldine Laws Nephrology/Dr. Posey Pronto - signed off Palliative Care  Procedures: Therapeutic paracentesis 11/16 IR  Antibiotics: Zosyn 11/14 - d/c vancomycin 11/14 - d/c Ancef 11/20  Objective: Blood pressure 130/59, pulse 70, temperature 97.9 F (36.6 C), temperature source Oral, resp. rate 17, height 5' (1.524 m), weight 140 lb 12.8 oz (63.866 kg), SpO2 100 %.  Intake/Output Summary (Last 24 hours) at 12/28/13 1048 Last data filed at 12/28/13 1010  Gross per 24 hour  Intake  481.2 ml  Output   2975 ml  Net -2493.8 ml   Exam: Gen: No acute respiratory distress, reporting that she does not feel well today Chest: Clear to auscultation bilaterally without wheezes, rhonchi or crackles, on room air Cardiac: Regular rate and rhythm, F8-H8, pansystolic murmur left sternal border fourth intercostal space, no rubs or gallops, + JVD, + edema to thighs bilaterally Abdomen: Soft nontender + mild distention without obvious hepatosplenomegaly, no ascites Extremities: Symmetrical in appearance without cyanosis, clubbing or effusion Skin: Extensive exfoliating dermatitis without any active bullae or drainage, excoriated areas again noted on arms.  Scheduled Meds:  Scheduled Meds: . antiseptic oral rinse  7 mL Mouth Rinse BID  . aspirin EC  81 mg Oral Daily  .  ceFAZolin (ANCEF) IV  1 g Intravenous Q12H  . feeding supplement (GLUCERNA SHAKE)  237 mL Oral Daily  . furosemide  160 mg Intravenous Q6H  . hydrocerin   Topical BID  . insulin aspart  0-9 Units Subcutaneous TID WC  . isosorbide mononitrate  60 mg Oral Daily  . lubriderm seriously sensitive   Topical BID  . metolazone  5 mg Oral BID  . neomycin-bacitracin-polymyxin   Topical BID  . potassium chloride  20  mEq Oral BID  . sodium chloride  10-40 mL Intracatheter Q12H   Continuous Infusions: . milrinone 0.125 mcg/kg/min (12/28/13 0400)    Data Reviewed: Basic Metabolic Panel:  Recent Labs Lab 12/25/13 0337 12/26/13 0600 12/27/13 0259 12/27/13 1125 12/28/13 0500  NA 135* 142 138 137 139  K 4.1 3.7 4.6 4.1 4.0  CL 99 101 98 98 97  CO2 19 26 24 24 27   GLUCOSE 89 90 120* 168* 106*  BUN 116* 112* 109* 103* 101*  CREATININE 3.24* 3.01* 2.89* 2.98* 2.79*  CALCIUM 8.1* 8.5 8.2* 8.2* 8.1*   Liver Function Tests:  Recent Labs Lab 12/21/13 2041  AST 45*  ALT 35  ALKPHOS 163*  BILITOT 0.8  PROT 7.1  ALBUMIN 2.8*   CBC:  Recent Labs Lab 12/21/13 2041 12/22/13 0525 12/23/13 0325 12/25/13 0337 12/26/13 0600 12/28/13 0500  WBC 3.5* 4.2 3.8* 4.3 5.6 5.8  NEUTROABS 1.7  --   --   --   --   --   HGB 7.7* 7.4* 7.0* 8.5* 10.2* 9.0*  HCT 23.9* 22.3* 21.0* 26.3* 30.7* 27.1*  MCV 101.3* 100.9* 101.0* 96.0 91.6 96.1  PLT 37* 39* 41* 58* 43* 49*   Cardiac Enzymes:  Recent Labs Lab 12/22/13 0525 12/22/13 1010 12/22/13 1610  TROPONINI <0.30 <0.30 <0.30   BNP (last 3 results)  Recent  Labs  10/27/13 1606 10/30/13 0531 12/21/13 2041  PROBNP 16497.0* 12300.0* 15092.0*   CBG:  Recent Labs Lab 12/27/13 1142 12/27/13 1559 12/27/13 1644 12/27/13 2142 12/28/13 0759  GLUCAP 173* 45* 129* 169* 97    Recent Results (from the past 240 hour(s))  Blood culture (routine x 2)     Status: None (Preliminary result)   Collection Time: 12/21/13  9:24 PM  Result Value Ref Range Status   Specimen Description BLOOD RIGHT ARM  Final   Special Requests BOTTLES DRAWN AEROBIC AND ANAEROBIC 6CC EA  Final   Culture  Setup Time   Final    12/22/2013 19:28 Performed at [facility]    Culture   Final           BLOOD CULTURE RECEIVED NO GROWTH TO DATE CULTURE WILL BE HELD FOR 5 DAYS BEFORE ISSUING A FINAL NEGATIVE REPORT Note: Culture results may be compromised due to an  excessive volume of blood received in culture bottles. Performed at [facility]    Report Status PENDING  Incomplete  Blood culture (routine x 2)     Status: None (Preliminary result)   Collection Time: 12/21/13  9:26 PM  Result Value Ref Range Status   Specimen Description BLOOD LEFT HAND  Final   Special Requests BOTTLES DRAWN AEROBIC ONLY 10CC  Final   Culture  Setup Time   Final    12/22/2013 19:27 Performed at [facility]    Culture   Final           BLOOD CULTURE RECEIVED NO GROWTH TO DATE CULTURE WILL BE HELD FOR 5 DAYS BEFORE ISSUING A FINAL NEGATIVE REPORT Note: Culture results may be compromised due to an excessive volume of blood received in culture bottles. Performed at [facility]    Report Status PENDING  Incomplete  Urine culture     Status: None   Collection Time: 12/21/13 10:06 PM  Result Value Ref Range Status   Specimen Description URINE, CATHETERIZED  Final   Special Requests NONE  Final   Culture  Setup Time   Final    12/22/2013 20:36 Performed at [place] Performed at [facility]   Final   Culture NO GROWTH Performed at [facility]   Final   Report Status 12/24/2013 FINAL  Final  MRSA PCR Screening     Status: None   Collection Time: 12/22/13  4:20 AM  Result Value Ref Range Status   MRSA by PCR NEGATIVE NEGATIVE Final    Comment:        The GeneXpert MRSA Assay (FDA approved for NASAL specimens only), is one component of a comprehensive MRSA colonization surveillance program. It is not intended to diagnose MRSA infection nor to guide or monitor treatment for MRSA infections.   Clostridium Difficile by PCR     Status: None   Collection Time: 12/23/13  1:59 AM  Result Value Ref Range Status   C difficile by pcr NEGATIVE NEGATIVE Final     Studies:  Recent x-ray studies have been reviewed in detail by the Attending Physician  Time spent :  35  mins   Gilles Chiquito, MD

## 2013-12-28 NOTE — Plan of Care (Signed)
Problem: Phase I Progression Outcomes Goal: Initial discharge plan identified Outcome: Progressing Goal: Voiding-avoid urinary catheter unless indicated Outcome: Progressing Goal: Hemodynamically stable Outcome: Completed/Met Date Met:  12/28/13

## 2013-12-28 NOTE — Progress Notes (Signed)
Patient ID: [name] [name], female   DOB: 1950-06-26, 63 y.o.   MRN: [id number] Advanced Heart Failure Rounding Note   Subjective:    63 yo with history of CAD and systolic HF due to ischemic cardiomyopathy with EF 30-35% andpulmonary venous hypertension, myelodysplastic syndrome, GI bleed, and CKD . Admitted with volume overload and acute on chronic renal failure.   Continues on lasix 160 mg IV every 6 hours + milrinone 0.125 mcg. Metolazone 5 mg bid.  Now with power PICC in right chest. Continues with good diuresis, weight inaccurate. CVP to 16.  R leg hurts. Feels cold.  Co-ox ok.   Objective:   Weight Range:  Vital Signs:   Temp:  [97.4 F (36.3 C)-98.6 F (37 C)] 97.9 F (36.6 C) (11/21 0800) Pulse Rate:  [69-88] 70 (11/21 0430) Resp:  [10-17] 17 (11/21 0430) BP: (117-130)/(49-67) 130/59 mmHg (11/21 0430) SpO2:  [99 %-100 %] 100 % (11/21 0430) Weight:  [63.866 kg (140 lb 12.8 oz)] 63.866 kg (140 lb 12.8 oz) (11/21 0900) Last BM Date: 12/27/13  Weight change: Filed Weights   12/26/13 0248 12/27/13 0441 12/28/13 0900  Weight: 60.918 kg (134 lb 4.8 oz) 59.285 kg (130 lb 11.2 oz) 63.866 kg (140 lb 12.8 oz)    Intake/Output:   Intake/Output Summary (Last 24 hours) at 12/28/13 0939 Last data filed at 12/28/13 0600  Gross per 24 hour  Intake  471.2 ml  Output   2325 ml  Net -1853.8 ml     Physical Exam: General:  Chronically ill appearing. No resp difficulty HEENT: normal diffuse exfoliative rash Neck: supple. JVP to jaw . Carotids 2+ bilat; no bruits. No lymphadenopathy or thryomegaly appreciated. Cor: PMI nondisplaced. Regular rate & rhythm. No rubs,  or murmurs. + S3  Lungs: clear Abdomen: soft, nontender, +distended. No hepatosplenomegaly. No bruits or masses. Good bowel sounds. Extremities: no cyanosis, clubbing, rash 1-2+ thigh and leg edema Neuro: alert & orientedx3, cranial nerves grossly intact. moves all 4 extremities w/o difficulty. Affect pleasant Skin: Dry  scales all over her body.   Telemetry: SR   Labs: Basic Metabolic Panel:  Recent Labs Lab 12/25/13 0337 12/26/13 0600 12/27/13 0259 12/27/13 1125 12/28/13 0500  NA 135* 142 138 137 139  K 4.1 3.7 4.6 4.1 4.0  CL 99 101 98 98 97  CO2 19 26 24 24 27   GLUCOSE 89 90 120* 168* 106*  BUN 116* 112* 109* 103* 101*  CREATININE 3.24* 3.01* 2.89* 2.98* 2.79*  CALCIUM 8.1* 8.5 8.2* 8.2* 8.1*    Liver Function Tests:  Recent Labs Lab 12/21/13 2041  AST 45*  ALT 35  ALKPHOS 163*  BILITOT 0.8  PROT 7.1  ALBUMIN 2.8*   No results for input(s): LIPASE, AMYLASE in the last 168 hours. No results for input(s): AMMONIA in the last 168 hours.  CBC:  Recent Labs Lab 12/21/13 2041 12/22/13 0525 12/23/13 0325 12/25/13 0337 12/26/13 0600 12/28/13 0500  WBC 3.5* 4.2 3.8* 4.3 5.6 5.8  NEUTROABS 1.7  --   --   --   --   --   HGB 7.7* 7.4* 7.0* 8.5* 10.2* 9.0*  HCT 23.9* 22.3* 21.0* 26.3* 30.7* 27.1*  MCV 101.3* 100.9* 101.0* 96.0 91.6 96.1  PLT 37* 39* 41* 58* 43* 49*    Cardiac Enzymes:  Recent Labs Lab 12/22/13 0525 12/22/13 1010 12/22/13 1610  TROPONINI <0.30 <0.30 <0.30    BNP: BNP (last 3 results)  Recent Labs  10/27/13 1606 10/30/13 0531 12/21/13  2041  PROBNP 16497.0* 12300.0* 15092.0*     Other results:    Imaging: Ir Fluoro Guide Cv Line Right  12/27/2013   CLINICAL DATA:  History of CHF, cardiomyopathy and stage 4 chronic kidney disease. The patient requires central venous access. Request has been made to place a tunneled jugular line due to need to preserve arm veins for potential future dialysis access.  EXAM: TUNNELED CENTRAL VENOUS CATHETER PLACEMENT WITH ULTRASOUND AND FLUOROSCOPIC GUIDANCE  ANESTHESIA/SEDATION: None  MEDICATIONS: None  FLUOROSCOPY TIME:  6 seconds.  PROCEDURE: The procedure, risks, benefits, and alternatives were explained to the patient. Questions regarding the procedure were encouraged and answered. The patient understands and  consents to the procedure.  The right neck and chest were prepped with chlorhexidine in a sterile fashion, and a sterile drape was applied covering the operative field. Maximum barrier sterile technique with sterile gowns and gloves were used for the procedure. Local anesthesia was provided with 1% lidocaine.  After creating a small venotomy incision, a 21 gauge needle was advanced into the right internal jugular vein under direct, real-time ultrasound guidance. Ultrasound image documentation was performed. After securing guidewire access, an 8 Fr dilator was placed. A wire was kinked to measure appropriate catheter length.  A 6 French, dual-lumen power line tunneled central venous catheter was tunneled in a retrograde fashion from the chest wall to the venotomy incision.  At the venotomy, serial dilatation was performed and a 6 Fr peel-away sheath was placed over a guidewire. The catheter was cut to 20 cm. The catheter was then placed through the sheath and the sheath removed. Final catheter positioning was confirmed and documented with a fluoroscopic spot image. The catheter was aspirated and flushed with saline.  The venotomy incision was closed with subcutaneous 4-0 Vicryl. Dermabond was applied to the incision. The catheter exit site was secured with 0-Prolene retention sutures.  COMPLICATIONS: None.  No pneumothorax.  FINDINGS: After catheter placement, the tip lies at the cavoatrial junction. The catheter aspirates normally and is ready for immediate use.  IMPRESSION: Placement of tunneled hemodialysis catheter via the right internal jugular vein. The catheter tip lies at the cavoatrial junction. The catheter is ready for immediate use.   Electronically Signed   By: Aletta Edouard M.D.   On: 12/27/2013 16:52   Ir US Guide Vasc Access Right  12/27/2013   CLINICAL DATA:  History of CHF, cardiomyopathy and stage 4 chronic kidney disease. The patient requires central venous access. Request has been made to  place a tunneled jugular line due to need to preserve arm veins for potential future dialysis access.  EXAM: TUNNELED CENTRAL VENOUS CATHETER PLACEMENT WITH ULTRASOUND AND FLUOROSCOPIC GUIDANCE  ANESTHESIA/SEDATION: None  MEDICATIONS: None  FLUOROSCOPY TIME:  6 seconds.  PROCEDURE: The procedure, risks, benefits, and alternatives were explained to the patient. Questions regarding the procedure were encouraged and answered. The patient understands and consents to the procedure.  The right neck and chest were prepped with chlorhexidine in a sterile fashion, and a sterile drape was applied covering the operative field. Maximum barrier sterile technique with sterile gowns and gloves were used for the procedure. Local anesthesia was provided with 1% lidocaine.  After creating a small venotomy incision, a 21 gauge needle was advanced into the right internal jugular vein under direct, real-time ultrasound guidance. Ultrasound image documentation was performed. After securing guidewire access, an 8 Fr dilator was placed. A wire was kinked to measure appropriate catheter length.  A 6 [place], dual-lumen  power line tunneled central venous catheter was tunneled in a retrograde fashion from the chest wall to the venotomy incision.  At the venotomy, serial dilatation was performed and a 6 Fr peel-away sheath was placed over a guidewire. The catheter was cut to 20 cm. The catheter was then placed through the sheath and the sheath removed. Final catheter positioning was confirmed and documented with a fluoroscopic spot image. The catheter was aspirated and flushed with saline.  The venotomy incision was closed with subcutaneous 4-0 Vicryl. Dermabond was applied to the incision. The catheter exit site was secured with 0-Prolene retention sutures.  COMPLICATIONS: None.  No pneumothorax.  FINDINGS: After catheter placement, the tip lies at the cavoatrial junction. The catheter aspirates normally and is ready for immediate use.   IMPRESSION: Placement of tunneled hemodialysis catheter via the right internal jugular vein. The catheter tip lies at the cavoatrial junction. The catheter is ready for immediate use.   Electronically Signed   By: Aletta Edouard M.D.   On: 12/27/2013 16:52   Dg Chest Port 1 View  12/26/2013   CLINICAL DATA:  Bleeding from around PICC line  EXAM: PORTABLE CHEST - 1 VIEW  COMPARISON:  12/26/2013  FINDINGS: Cardiac shadow remains enlarged. The left-sided PICC line has been withdrawn slightly in the interval and now lies at the junction of the innominate veins. If the PICC line has a tapered back and this may be a the etiology of the recent hemorrhage surrounding the catheter. The lungs are clear. No bony abnormality is noted.  IMPRESSION: Slight withdrawal of the PICC line as described above.   Electronically Signed   By: Inez Catalina M.D.   On: 12/26/2013 19:20     Medications:     Scheduled Medications: . antiseptic oral rinse  7 mL Mouth Rinse BID  . aspirin EC  81 mg Oral Daily  .  ceFAZolin (ANCEF) IV  1 g Intravenous Q12H  . feeding supplement (GLUCERNA SHAKE)  237 mL Oral Daily  . furosemide  160 mg Intravenous Q6H  . hydrocerin   Topical BID  . insulin aspart  0-9 Units Subcutaneous TID WC  . isosorbide mononitrate  60 mg Oral Daily  . lubriderm seriously sensitive   Topical BID  . metolazone  5 mg Oral BID  . neomycin-bacitracin-polymyxin   Topical BID  . potassium chloride  20 mEq Oral BID  . sodium chloride  10-40 mL Intracatheter Q12H    Infusions: . milrinone 0.125 mcg/kg/min (12/28/13 0400)    PRN Medications: acetaminophen **OR** acetaminophen, camphor-menthol, hydrOXYzine, ondansetron **OR** ondansetron (ZOFRAN) IV, oxyCODONE, sodium chloride, zolpidem   Assessment:  1. A/C Systolic Heart Failure. Ischemic cardiomyopathy, EF 30-35% with cardiorenal syndrome 2. Acute on chronic renal failure Stage IV: Not a good HD candidate.  3. DM2 4. H/O Gout 5. H/O GI bleed  10/2013  6. Diffuse scaly rash 7. MDS with pancytopenia   Plan/Discussion:    Remains on IV lasix, metolazone, and milrinone @ 0.125. Co-ox ok. CVP still up. BUN/creatinine is gradually coming down. Volume status improving with diuresis. We may be able to back down on diuretic regimen a bit soon. Overall improving from HF perspective with significant support. However still with severe underlying illness and debility.   Discussion with son earlier this wee: He understands that we are making some progress, and they would want to continue current care. But if she begins to deteriorate they would reconsider aggressiveness of care.   Benay Spice 9:39 AM

## 2013-12-29 DIAGNOSIS — Z452 Encounter for adjustment and management of vascular access device: Secondary | ICD-10-CM | POA: Insufficient documentation

## 2013-12-29 LAB — COMPREHENSIVE METABOLIC PANEL
ALBUMIN: 2.1 g/dL — AB (ref 3.5–5.2)
ALT: <5 U/L (ref 0–35)
AST: 21 U/L (ref 0–37)
Alkaline Phosphatase: 101 U/L (ref 39–117)
Anion gap: 14 (ref 5–15)
BUN: 95 mg/dL — ABNORMAL HIGH (ref 6–23)
CALCIUM: 7.4 mg/dL — AB (ref 8.4–10.5)
CO2: 26 mEq/L (ref 19–32)
Chloride: 98 mEq/L (ref 96–112)
Creatinine, Ser: 2.44 mg/dL — ABNORMAL HIGH (ref 0.50–1.10)
GFR calc non Af Amer: 20 mL/min — ABNORMAL LOW (ref >90)
GFR, EST AFRICAN AMERICAN: 23 mL/min — AB (ref >90)
GLUCOSE: 146 mg/dL — AB (ref 70–99)
Potassium: 3.5 mEq/L — ABNORMAL LOW (ref 3.7–5.3)
SODIUM: 138 meq/L (ref 137–147)
TOTAL PROTEIN: 5.6 g/dL — AB (ref 6.0–8.3)
Total Bilirubin: 0.9 mg/dL (ref 0.3–1.2)

## 2013-12-29 LAB — CARBOXYHEMOGLOBIN
CARBOXYHEMOGLOBIN: 2.5 % — AB (ref 0.5–1.5)
Methemoglobin: 0.8 % (ref 0.0–1.5)
O2 SAT: 76.5 %
Total hemoglobin: 8.5 g/dL — ABNORMAL LOW (ref 12.0–16.0)

## 2013-12-29 LAB — GLUCOSE, CAPILLARY
GLUCOSE-CAPILLARY: 325 mg/dL — AB (ref 70–99)
Glucose-Capillary: 104 mg/dL — ABNORMAL HIGH (ref 70–99)
Glucose-Capillary: 174 mg/dL — ABNORMAL HIGH (ref 70–99)
Glucose-Capillary: 162 mg/dL — ABNORMAL HIGH (ref 70–99)

## 2013-12-29 MED ORDER — INSULIN ASPART 100 UNIT/ML ~~LOC~~ SOLN
5.0000 [IU] | Freq: Once | SUBCUTANEOUS | Status: AC
  Administered 2013-12-29: 5 [IU] via SUBCUTANEOUS

## 2013-12-29 MED ORDER — POTASSIUM CHLORIDE CRYS ER 20 MEQ PO TBCR
20.0000 meq | EXTENDED_RELEASE_TABLET | Freq: Three times a day (TID) | ORAL | Status: DC
  Administered 2013-12-29 – 2013-12-31 (×6): 20 meq via ORAL
  Filled 2013-12-29 (×6): qty 1

## 2013-12-29 NOTE — Progress Notes (Signed)
Patient ID: [name] [name], female   DOB: [birth year], 63 y.o.   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 [birth year], 63 y.o.   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 On admission, patient was found to be in acute heart failure with ascites-similar to past admissions. She was also noted to have a creatinine of 3.45 and was hypothermic requiring Customer service manager. Patient placed in stepdown unit and started on IV Lasix. Noted on admission to have diffuse rash with secondary itching. Initially there was concern about sepsis and antibiotics started, discontinued the following morning as no immediate signs of infection noted.  11/16 she had not been responding very well to Lasix alone. Seen by nephrology and cardiology. Creatinine progressively worsening. Through son, patient declined central line needed for IV milrinone but Cardiology was able to have PICC placed instead since she is NOT a candidate for HD. She has continued to have issues with hypothermia requiring Customer service manager.  Per oncology, prognosis is poor; pt not a candidate for systemic treatment of high-grade myelodysplastic syndrome. Dr. Alvy Bimler recommended the option of Hospice but also felt the family at that time was not ready. She also recommended supportive care and to transfuse if Hgb </= 8.0.  Assessment/Plan:    Principal Problem: Acute on chronic combined systolic and diastolic congestive heart failure/ICM/ Ascites / hypokalemia  - we will continue current regimen with Lasix 160 mg IV Q 6 hours, metolazone 5 mg PO BID  and  Milrinone drip per cardiology recommendations. PICC placed. Appreciate cardiology following. - Continue potassium 20 meq PO BID supplementation. - urine output 1.4 l in past 24 hours. Weight trend since 12/28/13: 59.28 kg --> 58.7 kg  - continue Imdur 60 mg daily - ascites from passive hepato-congestion, somewhat improved. Has had IR guided paracentesis 12/23/2013 with 1.1 bloody ascitic fluid removed. - appreciate palliative care team following   Active Problems: Acute renal failure on CKD, stage IV with cardiorenal syndrome - creatinine trending down since past 48 hours: 2.98 --> 2.44 - not a candidate for HD, nephrology signed off   Essential hypertension - BP this am 111/58 - continue lasix, milrinone, metolazone, lasix  Hyperlipidemia - reported transaminitis, so statin d/c'd - Review of CMETs show only a mild elevation in AST on 11/14, but otherwise normal.   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  - appreciate WOC assessment  - Topical lotion for now - Neosporin for excoriated wounds - IV Ancef as prophylaxis to prevent diffuse cellulitis    DVT prophylaxis - SCD's bilaterally due to thrombocytopenia   Code Status: Full.  Family Communication:  plan of care discussed with the patient Disposition Plan: remains inpatient   IV access:  Peripheral IV  Procedures and diagnostic studies:    Ir Fluoro Guide Cv Line Right 12/27/2013  Placement of tunneled hemodialysis catheter via  the right internal jugular vein. The catheter tip lies at the cavoatrial junction. The catheter is ready for immediate use.     Ir US Guide Vasc Access Right 12/27/2013  Placement of tunneled hemodialysis catheter via the right internal jugular vein. The catheter tip lies at the cavoatrial junction. The catheter is ready for immediate use.     Dg Chest Port 1 View 12/26/2013   Slight withdrawal of the PICC line as described above.     Dg Chest Port 1 View 12/26/2013  No significant change in position of PICC line, which remains projected over the mid SVC region. Cardiac enlargement.     Dg Chest Port 1 View 12/26/2013   Left PICC line tip overlies the mid SVC region. No pneumothorax. No evidence of active pulmonary disease. Mild cardiac enlargement.    Paracentesis 12/23/2013   Medical Consultants:   Cardiology/ Dr. Haroldine Laws  Nephrology/Dr. Posey Pronto - signed off  Palliative Care  Other Consultants:   WOC  Physical therapy   IAnti-Infectives:    Zosyn 11/14 - d/c  vancomycin 11/14 - d/c  Ancef 12/27/2013 -->    Leisa Lenz, MD  [facility] Pager [phone]  If 7PM-7AM, please contact night-coverage [email or web address] Password [facility] 12/29/2013, 7:43 AM   LOS: 8 days    HPI/Subjective: No acute overnight events.  Objective: Filed Vitals:   12/29/13 0004 12/29/13 0200 12/29/13 0350 12/29/13 0400  BP: 136/71 90/49 103/61 99/55  Pulse: 70 71 71 71  Temp: 97.8 F (36.6 C)  97.8 F (36.6 C)   TempSrc: Oral  Oral   Resp: 13 19 10 11   Height:      Weight:    58.7 kg (129 lb 6.6 oz)  SpO2: 100% 98% 99% 100%    Intake/Output Summary (Last 24 hours) at 12/29/13 0743 Last data filed at 12/29/13 0400  Gross per 24 hour  Intake 1202.4 ml  Output   2925 ml  Net -1722.6 ml    Exam:   General:  Pt is alert, follows commands appropriately, not in acute distress  Cardiovascular: Regular rate and rhythm, S1/S2 appreciated, SEM appreciated soft  Respiratory:  Clear to auscultation bilaterally, no wheezing, no crackles, no rhonchi  Abdomen: Soft, non tender, non distended, bowel sounds present  Extremities: +1 lower extremity edema, pulses DP and PT palpable bilaterally  SKIN: exfoliating dermatitis   Neuro: Grossly nonfocal  Data Reviewed: Basic Metabolic Panel:  Recent Labs Lab 12/26/13 0600 12/27/13 0259 12/27/13 1125 12/28/13 0500 12/29/13 0439  NA 142 138 137 139 138  K 3.7 4.6 4.1 4.0 3.5*  CL 101 98 98 97 98  CO2 26 24 24 27 26   GLUCOSE 90 120* 168* 106* 146*  BUN 112* 109* 103* 101* 95*  CREATININE 3.01* 2.89* 2.98* 2.79* 2.44*  CALCIUM 8.5 8.2* 8.2* 8.1* 7.4*   Liver Function Tests:  Recent Labs Lab 12/29/13 0439  AST 21  ALT <5  ALKPHOS 101  BILITOT 0.9  PROT 5.6*  ALBUMIN 2.1*   No results for input(s): LIPASE, AMYLASE in the last 168 hours. No results for input(s): AMMONIA in the last 168 hours. CBC:  Recent Labs Lab 12/23/13 0325 12/25/13 0337 12/26/13 0600 12/28/13 0500  WBC 3.8* 4.3 5.6 5.8  HGB 7.0* 8.5* 10.2* 9.0*  HCT 21.0* 26.3* 30.7* 27.1*  MCV 101.0* 96.0 91.6 96.1  PLT 41* 58* 43* 49*   Cardiac Enzymes:  Recent Labs Lab 12/22/13 1010 12/22/13 1610  TROPONINI <0.30 <0.30   BNP: Invalid input(s): POCBNP CBG:  Recent Labs Lab 12/27/13 2142 12/28/13 0759 12/28/13 1157 12/28/13 1559 12/28/13 2127  GLUCAP 169* 97 264* 212* 153*    Blood culture (routine x 2)     Status: None   Collection Time: 12/21/13  9:24 PM  Result Value Ref Range Status   Specimen Description BLOOD RIGHT ARM  Final   Special Requests BOTTLES DRAWN AEROBIC AND ANAEROBIC 6CC   Final    NO GROWTH 5 DAYS   Report Status 12/28/2013 FINAL  Final  Blood culture (routine x 2)     Status: None   Collection Time: 12/21/13  9:26 PM  Result Value Ref Range Status   Specimen Description BLOOD LEFT HAND  Final   Special Requests BOTTLES DRAWN AEROBIC ONLY   Final    NO GROWTH 5 DAYS   Report Status  12/28/2013 FINAL  Final  Urine culture     Status: None   Collection Time: 12/21/13 10:06 PM  Result Value Ref Range Status   Specimen Description URINE, CATHETERIZED  Final   Culture NO GROWTH  Final   Report Status 12/24/2013 FINAL  Final  MRSA PCR Screening     Status: None   Collection Time: 12/22/13  4:20 AM  Result Value Ref Range Status   MRSA by PCR NEGATIVE NEGATIVE Final    Comment:        Clostridium Difficile by PCR     Status: None   Collection Time: 12/23/13  1:59 AM  Result Value Ref Range Status   C difficile by pcr NEGATIVE NEGATIVE Final     Scheduled Meds: . aspirin EC  81 mg Oral Daily  .  ceFAZolin (ANCEF) IV  1 g Intravenous Q12H  .  (GLUCERNA SHAKE)  237 mL Oral Daily  . furosemide  160 mg Intravenous Q6H  . hydrocerin   Topical BID  . insulin aspart  0-9 Units Subcutaneous TID WC  . isosorbide mononitrate  60 mg Oral Daily  . metolazone  5 mg Oral BID  . potassium chloride  20 mEq Oral BID   Continuous Infusions: . milrinone 0.125 mcg/kg/min (12/29/13 0400)

## 2013-12-29 NOTE — Progress Notes (Signed)
Patient Name: [name] [name] Date of Encounter: 12/29/2013  Principal Problem:   Acute on chronic combined systolic and diastolic congestive heart failure Active Problems:   Acute renal failure   Essential hypertension   Hyperlipidemia   Uncontrolled type 2 diabetes mellitus with peripheral neuropathy   CKD (chronic kidney disease), stage IV   Protein calorie malnutrition   Ischemic cardiomyopathy   Anemia in chronic renal disease   Thrombocytopenia   Ascites   Acute renal failure syndrome   MDS (myelodysplastic syndrome), high grade   Pruritic rash   Dyspnea and respiratory abnormality   Palliative care encounter   Type 2 diabetes mellitus with peripheral neuropathy   Protein-calorie malnutrition   Anemia of chronic disease   Myelodysplasia, high grade   Exfoliative dermatitis   Type 2 diabetes mellitus with autonomic neuropathy   HLD (hyperlipidemia)   Length of Stay: 8  SUBJECTIVE  Improved hemodynamically. Excellent diuresis without hypotension and with slight improvement in creatinine since yesterday. No significant arrhythmia on IV milrinone. Excelent cardiac output per co-ox. Comfortable lying fully supine. Looks depressed.  on lasix 160 mg IV every 6 hours + milrinone 0.125 mcg. Metolazone 5 mg bid K 3.5 CVP 8-11 (compared to 13-17 at initiation of Rx)  CURRENT MEDS . antiseptic oral rinse  7 mL Mouth Rinse BID  . aspirin EC  81 mg Oral Daily  .  ceFAZolin (ANCEF) IV  1 g Intravenous Q12H  . feeding supplement (GLUCERNA SHAKE)  237 mL Oral Daily  . furosemide  160 mg Intravenous Q6H  . hydrocerin   Topical BID  . insulin aspart  0-9 Units Subcutaneous TID WC  . isosorbide mononitrate  60 mg Oral Daily  . lubriderm seriously sensitive   Topical BID  . metolazone  5 mg Oral BID  . neomycin-bacitracin-polymyxin   Topical BID  . potassium chloride  20 mEq Oral BID  . sodium chloride  10-40 mL Intracatheter Q12H    OBJECTIVE   Intake/Output Summary (Last  24 hours) at 12/29/13 1210 Last data filed at 12/29/13 1006  Gross per 24 hour  Intake  890.4 ml  Output   2475 ml  Net -1584.6 ml   Filed Weights   12/27/13 0441 12/28/13 0900 12/29/13 0400  Weight: 130 lb 11.2 oz (59.285 kg) 140 lb 12.8 oz (63.866 kg) 129 lb 6.6 oz (58.7 kg)    PHYSICAL EXAM Filed Vitals:   12/29/13 0800 12/29/13 0900 12/29/13 1010 12/29/13 1020  BP: 112/59  111/58   Pulse: 69 88 71 71  Temp: 97.8 F (36.6 C)     TempSrc: Oral     Resp: 13 9 10 15   Height:      Weight:      SpO2: 100% 100% 100% 99%   General: Alert, oriented x3, no distress. Chronic illness appearance Head: no evidence of trauma, PERRL, EOMI, no exophtalmos or lid lag, no myxedema, no xanthelasma; normal ears, nose and oropharynx; rash  Neck: 8 cm jugular venous pulsations and prompt hepatojugular reflux; brisk carotid pulses without delay and no carotid bruits Chest: clear to auscultation, no signs of consolidation by percussion or palpation, normal fremitus, symmetrical and full respiratory excursions Cardiovascular: normal position and quality of the apical impulse, regular rhythm, normal first and second heart sounds, no rub, +ve S3 gallop, mo murmur Abdomen: mild distention, no masses by palpation, no abnormal pulsatility or arterial bruits, normal bowel sounds, no hepatosplenomegaly Extremities: rash, no clubbing, cyanosis or edema; 2+ radial, ulnar and  brachial pulses bilaterally; 2+ right femoral, posterior tibial and dorsalis pedis pulses; 2+ left femoral, posterior tibial and dorsalis pedis pulses; no subclavian or femoral bruits Neurological: grossly nonfocal  LABS  CBC  Recent Labs  12/28/13 0500  WBC 5.8  HGB 9.0*  HCT 27.1*  MCV 96.1  PLT 49*   Basic Metabolic Panel  Recent Labs  12/28/13 0500 12/29/13 0439  NA 139 138  K 4.0 3.5*  CL 97 98  CO2 27 26  GLUCOSE 106* 146*  BUN 101* 95*  CREATININE 2.79* 2.44*  CALCIUM 8.1* 7.4*   Liver Function  Tests  Recent Labs  12/29/13 0439  AST 21  ALT <5  ALKPHOS 101  BILITOT 0.9  PROT 5.6*  ALBUMIN 2.1*    Radiology Studies Imaging results have been reviewed and Ir Fluoro Guide Cv Line Right  12/27/2013   CLINICAL DATA:  History of CHF, cardiomyopathy and stage 4 chronic kidney disease. The patient requires central venous access. Request has been made to place a tunneled jugular line due to need to preserve arm veins for potential future dialysis access.  EXAM: TUNNELED CENTRAL VENOUS CATHETER PLACEMENT WITH ULTRASOUND AND FLUOROSCOPIC GUIDANCE  ANESTHESIA/SEDATION: None  MEDICATIONS: None  FLUOROSCOPY TIME:  6 seconds.  PROCEDURE: The procedure, risks, benefits, and alternatives were explained to the patient. Questions regarding the procedure were encouraged and answered. The patient understands and consents to the procedure.  The right neck and chest were prepped with chlorhexidine in a sterile fashion, and a sterile drape was applied covering the operative field. Maximum barrier sterile technique with sterile gowns and gloves were used for the procedure. Local anesthesia was provided with 1% lidocaine.  After creating a small venotomy incision, a 21 gauge needle was advanced into the right internal jugular vein under direct, real-time ultrasound guidance. Ultrasound image documentation was performed. After securing guidewire access, an 8 Fr dilator was placed. A wire was kinked to measure appropriate catheter length.  A 6 French, dual-lumen power line tunneled central venous catheter was tunneled in a retrograde fashion from the chest wall to the venotomy incision.  At the venotomy, serial dilatation was performed and a 6 Fr peel-away sheath was placed over a guidewire. The catheter was cut to 20 cm. The catheter was then placed through the sheath and the sheath removed. Final catheter positioning was confirmed and documented with a fluoroscopic spot image. The catheter was aspirated and flushed  with saline.  The venotomy incision was closed with subcutaneous 4-0 Vicryl. Dermabond was applied to the incision. The catheter exit site was secured with 0-Prolene retention sutures.  COMPLICATIONS: None.  No pneumothorax.  FINDINGS: After catheter placement, the tip lies at the cavoatrial junction. The catheter aspirates normally and is ready for immediate use.  IMPRESSION: Placement of tunneled hemodialysis catheter via the right internal jugular vein. The catheter tip lies at the cavoatrial junction. The catheter is ready for immediate use.   Electronically Signed   By: Aletta Edouard M.D.   On: 12/27/2013 16:52   Ir US Guide Vasc Access Right  12/27/2013   CLINICAL DATA:  History of CHF, cardiomyopathy and stage 4 chronic kidney disease. The patient requires central venous access. Request has been made to place a tunneled jugular line due to need to preserve arm veins for potential future dialysis access.  EXAM: TUNNELED CENTRAL VENOUS CATHETER PLACEMENT WITH ULTRASOUND AND FLUOROSCOPIC GUIDANCE  ANESTHESIA/SEDATION: None  MEDICATIONS: None  FLUOROSCOPY TIME:  6 seconds.  PROCEDURE: The procedure,  risks, benefits, and alternatives were explained to the patient. Questions regarding the procedure were encouraged and answered. The patient understands and consents to the procedure.  The right neck and chest were prepped with chlorhexidine in a sterile fashion, and a sterile drape was applied covering the operative field. Maximum barrier sterile technique with sterile gowns and gloves were used for the procedure. Local anesthesia was provided with 1% lidocaine.  After creating a small venotomy incision, a 21 gauge needle was advanced into the right internal jugular vein under direct, real-time ultrasound guidance. Ultrasound image documentation was performed. After securing guidewire access, an 8 Fr dilator was placed. A wire was kinked to measure appropriate catheter length.  A 6 French, dual-lumen power line  tunneled central venous catheter was tunneled in a retrograde fashion from the chest wall to the venotomy incision.  At the venotomy, serial dilatation was performed and a 6 Fr peel-away sheath was placed over a guidewire. The catheter was cut to 20 cm. The catheter was then placed through the sheath and the sheath removed. Final catheter positioning was confirmed and documented with a fluoroscopic spot image. The catheter was aspirated and flushed with saline.  The venotomy incision was closed with subcutaneous 4-0 Vicryl. Dermabond was applied to the incision. The catheter exit site was secured with 0-Prolene retention sutures.  COMPLICATIONS: None.  No pneumothorax.  FINDINGS: After catheter placement, the tip lies at the cavoatrial junction. The catheter aspirates normally and is ready for immediate use.  IMPRESSION: Placement of tunneled hemodialysis catheter via the right internal jugular vein. The catheter tip lies at the cavoatrial junction. The catheter is ready for immediate use.   Electronically Signed   By: Aletta Edouard M.D.   On: 12/27/2013 16:52    TELE SR, occ PVC  ASSESSMENT AND PLAN 1. A/C Systolic Heart Failure. Ischemic cardiomyopathy, EF 30-35% with cardiorenal syndrome 2. Acute on chronic renal failure Stage IV: Not a good HD candidate.  3. DM2 4. H/O Gout 5. H/O GI bleed 10/2013  6. Diffuse scaly rash 7. MDS with pancytopenia  Marked hemodynamic improvement, will continue current inotrope/diuretic regimen for another 24 hours, after which I suspect will need to reduce diuretics. Correct hypokalemia. Long term prognosis is dismal due to concomitant cardiac/renal and hematological disease.   Sanda Klein, MD, [facility] [phone] office [phone] pager 12/29/2013 12:10 PM

## 2013-12-29 NOTE — Plan of Care (Signed)
Problem: Phase I Progression Outcomes Goal: Dyspnea controlled at rest (HF) Outcome: Completed/Met Date Met:  12/29/13 Goal: Voiding-avoid urinary catheter unless indicated Outcome: Progressing  Problem: Phase II Progression Outcomes Goal: Dyspnea controlled with activity Outcome: Completed/Met Date Met:  12/29/13 Goal: Tolerating diet Outcome: Completed/Met Date Met:  12/29/13

## 2013-12-30 LAB — GLUCOSE, CAPILLARY
GLUCOSE-CAPILLARY: 207 mg/dL — AB (ref 70–99)
Glucose-Capillary: 94 mg/dL (ref 70–99)
Glucose-Capillary: 164 mg/dL — ABNORMAL HIGH (ref 70–99)
Glucose-Capillary: 128 mg/dL — ABNORMAL HIGH (ref 70–99)

## 2013-12-30 LAB — CBC
HCT: 24.9 % — ABNORMAL LOW (ref 36.0–46.0)
Hemoglobin: 8.4 g/dL — ABNORMAL LOW (ref 12.0–15.0)
MCH: 32.8 pg (ref 26.0–34.0)
MCHC: 33.7 g/dL (ref 30.0–36.0)
MCV: 97.3 fL (ref 78.0–100.0)
PLATELETS: 55 10*3/uL — AB (ref 150–400)
RBC: 2.56 MIL/uL — ABNORMAL LOW (ref 3.87–5.11)
RDW: 20.8 % — ABNORMAL HIGH (ref 11.5–15.5)
WBC: 5.2 10*3/uL (ref 4.0–10.5)

## 2013-12-30 LAB — CARBOXYHEMOGLOBIN
CARBOXYHEMOGLOBIN: 2.1 % — AB (ref 0.5–1.5)
Methemoglobin: 1 % (ref 0.0–1.5)
O2 Saturation: 78.9 %
TOTAL HEMOGLOBIN: 8.6 g/dL — AB (ref 12.0–16.0)

## 2013-12-30 MED ORDER — INSULIN ASPART 100 UNIT/ML ~~LOC~~ SOLN: 0.0000 [IU] | Freq: Three times a day (TID) | SUBCUTANEOUS | Status: DC

## 2013-12-30 MED ORDER — INSULIN ASPART 100 UNIT/ML ~~LOC~~ SOLN
0.0000 [IU] | Freq: Three times a day (TID) | SUBCUTANEOUS | Status: DC
  Administered 2013-12-30: 3 [IU] via SUBCUTANEOUS
  Administered 2013-12-30: 2 [IU] via SUBCUTANEOUS
  Administered 2013-12-31: 5 [IU] via SUBCUTANEOUS
  Administered 2014-01-01: 2 [IU] via SUBCUTANEOUS
  Administered 2014-01-01: 3 [IU] via SUBCUTANEOUS
  Administered 2014-01-02: 2 [IU] via SUBCUTANEOUS
  Administered 2014-01-02: 5 [IU] via SUBCUTANEOUS
  Administered 2014-01-02: 2 [IU] via SUBCUTANEOUS
  Administered 2014-01-03: 5 [IU] via SUBCUTANEOUS
  Administered 2014-01-03: 2 [IU] via SUBCUTANEOUS
  Administered 2014-01-04: 11 [IU] via SUBCUTANEOUS
  Administered 2014-01-04: 5 [IU] via SUBCUTANEOUS
  Administered 2014-01-05: 2 [IU] via SUBCUTANEOUS
  Administered 2014-01-05 – 2014-01-06 (×3): 3 [IU] via SUBCUTANEOUS
  Administered 2014-01-06 – 2014-01-07 (×2): 2 [IU] via SUBCUTANEOUS
  Administered 2014-01-07: 5 [IU] via SUBCUTANEOUS
  Administered 2014-01-08: 3 [IU] via SUBCUTANEOUS
  Administered 2014-01-08: 2 [IU] via SUBCUTANEOUS
  Administered 2014-01-09: 3 [IU] via SUBCUTANEOUS
  Administered 2014-01-10: 2 [IU] via SUBCUTANEOUS

## 2013-12-30 MED ORDER — INSULIN ASPART 100 UNIT/ML ~~LOC~~ SOLN
0.0000 [IU] | Freq: Every day | SUBCUTANEOUS | Status: DC
Start: 2013-12-30 — End: 2014-01-10
  Administered 2013-12-30 – 2014-01-08 (×4): 2 [IU] via SUBCUTANEOUS

## 2013-12-30 MED ORDER — INSULIN ASPART 100 UNIT/ML ~~LOC~~ SOLN: 0.0000 [IU] | Freq: Every day | SUBCUTANEOUS | Status: DC

## 2013-12-30 NOTE — Evaluation (Signed)
Physical Therapy Evaluation Patient Details Name: [name] [name] MRN: [id number] DOB: 09-22-1950 Today's Date: 12/30/2013   History of Present Illness  63 yo with history of CAD and systolic HF and pulmonary venous hypertension, myelodysplastic syndrome, GI bleed, and CKD . Admitted with volume overload and acute on chronic renal failure.   Clinical Impression  Patient demonstrates deficits in functional mobility as indicated below. Will need continued skilled PT to address deficits and progress as tolerated. OF NOTE: patient with fecal incontinence during ambulation.  VSS throughout session.    Follow Up Recommendations No PT follow up;Supervision for mobility/OOB    Equipment Recommendations  None recommended by PT    Recommendations for Other Services       Precautions / Restrictions Precautions Precautions: Fall Restrictions Weight Bearing Restrictions: No      Mobility  Bed Mobility Overal bed mobility: Needs Assistance Bed Mobility: Rolling;Sidelying to Sit Rolling: Min guard Sidelying to sit: Min assist       General bed mobility comments: assist to elevate trunk to upright  Transfers Overall transfer level: Needs assistance Equipment used: Rolling walker (2 wheeled) Transfers: Sit to/from [facility] Sit to Stand: Min assist Stand pivot transfers: Min assist       General transfer comment: Min assist for stbaility, tactile cues for hand placement  Ambulation/Gait Ambulation/Gait assistance: Min guard Ambulation Distance (Feet): 110 Feet Assistive device: Rolling walker (2 wheeled) Gait Pattern/deviations: Step-through pattern;Decreased stride length;Narrow base of support Gait velocity: decreased Gait velocity interpretation: Below normal speed for age/gender General Gait Details: modest instbaility noted with ambulation, during in room ambulation patient incontinent of stool  Stairs            Wheelchair Mobility    Modified  Rankin (Stroke Patients Only)       Balance Overall balance assessment: Needs assistance Sitting-balance support: Feet supported Sitting balance-Leahy Scale: Fair     Standing balance support: Single extremity supported;During functional activity Standing balance-Leahy Scale: Fair Standing balance comment: min assist during hygiene and pericare             High level balance activites: Side stepping;Turns;Direction changes High Level Balance Comments: min guard to min assist for stability             Pertinent Vitals/Pain Pain Assessment: Faces Faces Pain Scale: Hurts a little bit Pain Descriptors / Indicators: Discomfort Pain Intervention(s): Monitored during session;Repositioned    Home Living Family/patient expects to be discharged to:: Private residence Living Arrangements: Children Available Help at Discharge: Family;Available PRN/intermittently Type of Home: House Home Access: Level entry     Home Layout: One level Home Equipment: Cane - single point Additional Comments: history taken from previous admission    Prior Function                 Hand Dominance        Extremity/Trunk Assessment               Lower Extremity Assessment: Generalized weakness         Communication   Communication: Prefers language other than English (cambodian)  Cognition Arousal/Alertness: Awake/alert Behavior During Therapy: WFL for tasks assessed/performed Overall Cognitive Status: Difficult to assess                      General Comments General comments (skin integrity, edema, etc.): patient with fecal incontinence during ambulation, assisted patient to commode, performed hygiene and pericare, then ambulated in hall come back to room,  tolerated general LE ther ex with visual assist then assisted to chair.     Exercises        Assessment/Plan    PT Assessment Patient needs continued PT services  PT Diagnosis Difficulty  walking;Generalized weakness   PT Problem List Decreased strength;Decreased range of motion;Decreased activity tolerance;Decreased balance;Decreased mobility;Decreased knowledge of use of DME;Cardiopulmonary status limiting activity;Pain  PT Treatment Interventions DME instruction;Gait training;Functional mobility training;Therapeutic activities;Therapeutic exercise;Balance training;Stair training;Patient/family education   PT Goals (Current goals can be found in the Care Plan section) Acute Rehab PT Goals Patient Stated Goal: none stated PT Goal Formulation: With patient Time For Goal Achievement: 01/13/14 Potential to Achieve Goals: Good    Frequency Min 3X/week   Barriers to discharge        Co-evaluation               End of Session Equipment Utilized During Treatment: Gait belt Activity Tolerance: Patient tolerated treatment well Patient left: in chair;with call bell/phone within reach Nurse Communication: Mobility status         Time: 3785-8850 PT Time Calculation (min) (ACUTE ONLY): 27 min   Charges:   PT Evaluation $Initial PT Evaluation Tier I: 1 Procedure PT Treatments $Gait Training: 8-22 mins $Therapeutic Activity: 8-22 mins   PT G CodesDuncan Dull 12/30/2013, 3:13 PM Alben Deeds, [place] DPT  [phone]

## 2013-12-30 NOTE — Progress Notes (Addendum)
NUTRITION FOLLOW UP  INTERVENTION: Continue Glucerna Shake po daily, each supplement provides 220 kcal and 10 grams of protein RD to follow for nutrition care plan  NUTRITION DIAGNOSIS: Inadequate oral intake, improved  Goal: Pt to meet >/= 90% of their estimated nutrition needs, progressing  Monitor:  PO & supplemental intake, weight, labs, I/O's  ASSESSMENT: 63 year old [name] with PMH of DM, ischemic cardiomyopathy-medically managed, systolic/diastolic heart failure, stage IV CKD; admitted for worsening shortness of breath times several days.   Pt continues on a Heart Healthy diet.  PO intake better at 90% this AM for breakfast per flowsheet records.  Drinking her Glucerna Shake supplement daily.  Chart reviewed.  Pt with ARF on CKD, Stage IV with cardiorenal syndrome, however, not candidate for HD.  Continues on IV Lasix.  Volume status improved.  This RD provided low-sodium diet education handouts on 11/17.  Height: Ht Readings from Last 1 Encounters:  12/22/13 5' (1.524 m)    Weight: Wt Readings from Last 1 Encounters:  12/30/13 119 lb 7.8 oz (54.2 kg)    BMI:  Body mass index is 23.34 kg/(m^2). highly skewed  Estimated Nutritional Needs: Kcal: 1300-1500 Protein: 65-75 gm Fluid: >/= 1.5 L  Skin: Intact  Diet Order: Diet Heart   Intake/Output Summary (Last 24 hours) at 12/30/13 1454 Last data filed at 12/30/13 1126  Gross per 24 hour  Intake  718.4 ml  Output   3775 ml  Net -3056.6 ml    Labs:   Recent Labs Lab 12/27/13 1125 12/28/13 0500 12/29/13 0439  NA 137 139 138  K 4.1 4.0 3.5*  CL 98 97 98  CO2 24 27 26   BUN 103* 101* 95*  CREATININE 2.98* 2.79* 2.44*  CALCIUM 8.2* 8.1* 7.4*  GLUCOSE 168* 106* 146*    CBG (last 3)   Recent Labs  12/29/13 2249 12/30/13 0805 12/30/13 1232  GLUCAP 325* 94 164*    Scheduled Meds: . antiseptic oral rinse  7 mL Mouth Rinse BID  . aspirin EC  81 mg Oral Daily  .  ceFAZolin (ANCEF) IV   1 g Intravenous Q12H  . feeding supplement (GLUCERNA SHAKE)  237 mL Oral Daily  . furosemide  160 mg Intravenous Q6H  . hydrocerin   Topical BID  . insulin aspart  0-15 Units Subcutaneous TID WC  . insulin aspart  0-5 Units Subcutaneous QHS  . isosorbide mononitrate  60 mg Oral Daily  . lubriderm seriously sensitive   Topical BID  . metolazone  5 mg Oral BID  . neomycin-bacitracin-polymyxin   Topical BID  . potassium chloride  20 mEq Oral TID  . sodium chloride  10-40 mL Intracatheter Q12H    Continuous Infusions: . milrinone 0.125 mcg/kg/min (12/30/13 0600)    Past Medical History  Diagnosis Date  . Hypertension   . Pneumonia   . CHF (congestive heart failure) 08/2012  . Anemia 08/2012  . UTI (urinary tract infection) 08/22/2012  . Ischemic cardiomyopathy     EF 30-35%  . Coronary artery disease   . Pulmonary hypertension   . NSVT (nonsustained ventricular tachycardia) 02/2013  . Diabetes mellitus     Reportedly diagnosed 2011 but no medication initiated until 04/2011 (Metformin)  . Diabetic peripheral neuropathy     Since 2013  . Thrombocytopenia   . CKD (chronic kidney disease)   . Chronic systolic CHF (congestive heart failure)     Echo (10/16/13):  EF 30% to 35%. Akinesis of inf-lat and inferior, anterior,  anterolateral, and apical myocardium. Restrictive physiology.  Mild AI, MAC, mod MR, mild LAE, mild RVE, mild reduced RVSF, mild RAE, mod TR, Mod PI, PASP 79 mmHg, L pleural effusion.  Marland Kitchen Uncontrolled diabetes mellitus   . HTN (hypertension)   . Pulmonary HTN     a. Cath 02/2013: severe pulm HTN.  . CAD (coronary artery disease)     a. Cath 02/2013: severe 2V CAD - 100% mRCA with L-R collaterals; diffuse 80-95% subtotal OM2 (too diffusely diseased to attempt PCA), overall small caliber diffusely diseased coronaries c/w poorly controlled DM; treated medically.  . CKD (chronic kidney disease), stage IV   . Ischemic cardiomyopathy   . Anemia     a. Prior normal EGD 03/2013. b.  Progressive anemia 06/2013 in setting of transient hematemesis that had resolved.   . Thrombocytopenia     a. Liver imaging normal 03/2013.  . Edema     a. RUE edema with neg duplex for DVT 06/2013.  Marland Kitchen Protein calorie malnutrition   . Poor social situation   . Ascites     a. s/p paracentesis 03/2013.  . Valvular heart disease     a. Echo 06/2013: mild AI, mod MR, mod TR, mod PR.    Past Surgical History  Procedure Laterality Date  . Cesarean section    . Tubal ligation    . Esophagogastroduodenoscopy N/A 03/13/2013    Procedure: ESOPHAGOGASTRODUODENOSCOPY (EGD);  Surgeon: Winfield Cunas., MD;  Location: [facility] ENDOSCOPY;  Service: Endoscopy;  Laterality: N/A;  . Cardiac catheterization  02/2013    severe 2 vessel CAD    Arthur Holms, RD, LDN Pager #: [phone] After-Hours Pager #: [phone]

## 2013-12-30 NOTE — Progress Notes (Signed)
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 On admission, patient was found to be in acute heart failure with ascites-similar to past admissions. She was also noted to have a creatinine of 3.45 and was hypothermic requiring Customer service manager. Patient placed in stepdown unit and started on IV Lasix. Initially there was concern about sepsis and antibiotics started, discontinued the following morning as no immediate signs of infection noted  11/16 she had not been responding very well to Lasix alone. Seen by nephrology and cardiology. Creatinine progressively worsening. Through son, patient declined central line needed for IV milrinone but Cardiology was able to have PICC placed instead since she is NOT a candidate for HD. She has continued to have issues with hypothermia requiring Retail banker.  11/17 outpt notes from oncology reviewed. Prognosis documented as poor and NOT candidate for systemic treatment of high-grade myelodysplastic syndrome. Dr. Alvy Bimler recommended the option of Hospice but also felt the family at that time was not ready. She also recommended supportive care and to transfuse if Hgb </= 8.0.  HPI/Subjective: Alert and eating breakfast - itching seems to finally be resolving  Assessment/Plan:    Acute on chronic combined systolic and diastolic congestive heart failure/ICM/ Ascites -Cont IV Lasix and Milrinone, and metolazone (per Cards) -neg 13,000 cc since admission-weights have been inconsistent -Appreciate cardiology  assistance -PICC line in chest  -ascites from passive hepatic congestion; Interventional radiology performed therapeutic paracentesis with 1.1 L bloody fluid removed -cont Imdur and KCL    Acute renal failure on CKD, stage IV with cardiorenal syndrome -Appreciate nephrology assistance -Scr slowly trending down -NOT a candidate for HD-nephrology has signed off -Palliative has evaluated this admission    Essential hypertension -BP soft and due to recent episodes of hypotension hydralazine was dc'd -thus far BP tolerating diuresis    Hyperlipidemia -due to transaminitis statin was dc'd    Type 2 diabetes mellitus with peripheral neuropathy -CBGs trending up as intake improves -increase to moderate SSI -HgbA1c was 6.2 Sept 2015      Protein calorie malnutrition -Nutrition following -suspect primary anorexia 2/2 bowel edema from CHF    Anemia 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) -MDS driving sx anemia -per onco OP notes rec is to transfuse for Hgb </= 8.0 -11/17 Hgb 7.0 so was transfused 2 units PRBCs each over 4 hours -current hgb stable around 9 -severe MDS alone offers dire prognosis-Palliative eval in progress   Exfoliative dermatitis -suspect less likely drug rash -Neosporin for excoriated wounds; lotions to intact skin -Added IV Ancef as prophylaxis to prevent diffuse cellulitis    DVT prophylaxis: SCDs Code Status: Full Family Communication: No family at bedside Disposition Plan/Expected LOS: SDU   Consultants: Cardiology/ Dr. Haroldine Laws Nephrology/Dr. Posey Pronto Palliative Care/Dr. Deitra Mayo  Procedures: Therapeutic paracentesis 11/16 IR  Antibiotics: Zosyn 11/14  vancomycin 11/14  Ancef 11/19 >  Objective: Blood pressure 107/48, pulse 75, temperature 97.8 F (36.6 C), temperature source Oral, resp. rate 11, height 5' (1.524 m), weight 119 lb 7.8 oz (54.2 kg), SpO2 99 %.  Intake/Output Summary (Last 24 hours) at 12/30/13 1059 Last data filed at  12/30/13  1026  Gross per 24 hour  Intake    728 ml  Output   3825 ml  Net  -3097 ml   Exam: Gen: No acute respiratory distress Chest: Clear to auscultation bilaterally without wheezes, rhonchi or crackles, room air Cardiac: Regular rate and rhythm, Y3-K1, pansystolic murmur left sternal border fourth intercostal space, no rubs or gallops, mild JVD, trace bilateral lower extremity edema Abdomen: Soft nontender nondistended without obvious hepatosplenomegaly, no ascites Extremities: Symmetrical in appearance without cyanosis, clubbing or effusion Skin: Extensive exfoliating dermatitis without any active bullae or drainage which is improving, several excoriated areas esp in antecubital region also improving-less generalized erythema  Scheduled Meds:  Scheduled Meds: . antiseptic oral rinse  7 mL Mouth Rinse BID  . aspirin EC  81 mg Oral Daily  .  ceFAZolin (ANCEF) IV  1 g Intravenous Q12H  . feeding supplement (GLUCERNA SHAKE)  237 mL Oral Daily  . furosemide  160 mg Intravenous Q6H  . hydrocerin   Topical BID  . insulin aspart  0-5 Units Subcutaneous QHS  . isosorbide mononitrate  60 mg Oral Daily  . lubriderm seriously sensitive   Topical BID  . metolazone  5 mg Oral BID  . neomycin-bacitracin-polymyxin   Topical BID  . potassium chloride  20 mEq Oral TID  . sodium chloride  10-40 mL Intracatheter Q12H   Continuous Infusions: . milrinone 0.125 mcg/kg/min (12/30/13 0600)    Data Reviewed: Basic Metabolic Panel:  Recent Labs Lab 12/26/13 0600 12/27/13 0259 12/27/13 1125 12/28/13 0500 12/29/13 0439  NA 142 138 137 139 138  K 3.7 4.6 4.1 4.0 3.5*  CL 101 98 98 97 98  CO2 26 24 24 27 26   GLUCOSE 90 120* 168* 106* 146*  BUN 112* 109* 103* 101* 95*  CREATININE 3.01* 2.89* 2.98* 2.79* 2.44*  CALCIUM 8.5 8.2* 8.2* 8.1* 7.4*   Liver Function Tests:  Recent Labs Lab 12/29/13 0439  AST 21  ALT <5  ALKPHOS 101  BILITOT 0.9  PROT 5.6*  ALBUMIN 2.1*   CBC:  Recent  Labs Lab 12/25/13 0337 12/26/13 0600 12/28/13 0500  WBC 4.3 5.6 5.8  HGB 8.5* 10.2* 9.0*  HCT 26.3* 30.7* 27.1*  MCV 96.0 91.6 96.1  PLT 58* 43* 49*   BNP (last 3 results)  Recent Labs  10/27/13 1606 10/30/13 0531 12/21/13 2041  PROBNP 16497.0* 12300.0* 15092.0*   CBG:  Recent Labs Lab 12/29/13 0757 12/29/13 1141 12/29/13 1608 12/29/13 2249 12/30/13 0805  GLUCAP 104* 174* 162* 325* 94    Recent Results (from the past 240 hour(s))  Blood culture (routine x 2)     Status: None   Collection Time: 12/21/13  9:24 PM  Result Value Ref Range Status   Specimen Description BLOOD RIGHT ARM  Final   Special Requests BOTTLES DRAWN AEROBIC AND ANAEROBIC 6CC EA  Final   Culture  Setup Time   Final    12/22/2013 19:28 Performed at [facility]    Culture   Final    NO GROWTH 5 DAYS Note: Culture results may be compromised due to an excessive volume of blood received in culture bottles. Performed at [facility]    Report Status 12/28/2013 FINAL  Final  Blood culture (routine x 2)     Status: None   Collection Time: 12/21/13  9:26 PM  Result Value Ref Range Status   Specimen Description BLOOD LEFT HAND  Final   Special Requests BOTTLES DRAWN AEROBIC ONLY 10CC  Final   Culture  Setup Time   Final    12/22/2013 19:27 Performed at [facility]    Culture   Final    NO GROWTH 5 DAYS Note: Culture results may be compromised due to an excessive volume of blood received in culture bottles. Performed at [facility]    Report Status 12/28/2013 FINAL  Final  Urine culture     Status: None   Collection Time: 12/21/13 10:06 PM  Result Value Ref Range Status   Specimen Description URINE, CATHETERIZED  Final   Special Requests NONE  Final   Culture  Setup Time   Final    12/22/2013 20:36 Performed at [place] Performed at [facility]   Final   Culture NO GROWTH Performed at [facility]   Final   Report Status 12/24/2013 FINAL  Final  MRSA PCR Screening     Status: None   Collection Time: 12/22/13  4:20 AM  Result Value Ref Range Status   MRSA by PCR NEGATIVE NEGATIVE Final    Comment:        The GeneXpert MRSA Assay (FDA approved for NASAL specimens only), is one component of a comprehensive MRSA colonization surveillance program. It is not intended to diagnose MRSA infection nor to guide or monitor treatment for MRSA infections.   Clostridium Difficile by PCR     Status: None   Collection Time: 12/23/13  1:59 AM  Result Value Ref Range Status   C difficile by pcr NEGATIVE NEGATIVE Final     Studies:  Recent x-ray studies have been reviewed in detail by the Attending Physician  Time spent :  [place], ANP [facility] Office  [phone] Pager [phone]  On-Call/Text Page:      [email or web address]      password [email or web address]  If 7PM-7AM, please contact night-coverage [email or web address] Password [email or web address] 12/30/2013, 10:59 AM   LOS: 9 days   I have personally examined this patient and reviewed the entire database. I have reviewed the above note, made any necessary editorial changes, and agree with its content.  Cherene Altes, MD [facility]

## 2013-12-30 NOTE — Progress Notes (Deleted)
Lacy Duverney PA Made aware of Critical NA CL & Serum Osmolarity results.

## 2013-12-30 NOTE — Plan of Care (Signed)
Problem: Phase II Progression Outcomes Goal: Fluid volume status improved Outcome: Progressing

## 2013-12-30 NOTE — Progress Notes (Signed)
Patient ID: [name] [name], female   DOB: [birth year], 63 y.o.   MRN: [id number] Advanced Heart Failure Rounding Note   Subjective:    63 yo with history of CAD and systolic HF due to ischemic cardiomyopathy with EF 30-35% andpulmonary venous hypertension, myelodysplastic syndrome, GI bleed, and CKD . Admitted with volume overload and acute on chronic renal failure.   Continues on lasix 160 mg IV every 6 hours + milrinone 0.125 mcg. Metolazone 5 mg bid.  Now with power PICC in right chest. Continues with good diuresis. Weight 11 down pounds over 3 days (19 pounds total). Denies dyspnea. BMET pending for today. Co-ox 77% CVP still 13.    Objective:   Weight Range:  Vital Signs:   Temp:  [97.8 F (36.6 C)-98.6 F (37 C)] 97.8 F (36.6 C) (11/23 0806) Pulse Rate:  [70-88] 75 (11/23 0806) Resp:  [5-20] 11 (11/23 0806) BP: (107-147)/(47-75) 107/48 mmHg (11/23 0806) SpO2:  [98 %-100 %] 99 % (11/23 0806) Weight:  [54.2 kg (119 lb 7.8 oz)] 54.2 kg (119 lb 7.8 oz) (11/23 0500) Last BM Date: 12/29/13  Weight change: Filed Weights   12/28/13 0900 12/29/13 0400 12/30/13 0500  Weight: 63.866 kg (140 lb 12.8 oz) 58.7 kg (129 lb 6.6 oz) 54.2 kg (119 lb 7.8 oz)    Intake/Output:   Intake/Output Summary (Last 24 hours) at 12/30/13 0838 Last data filed at 12/30/13 3825  Gross per 24 hour  Intake  222.8 ml  Output   4425 ml  Net -4202.2 ml     Physical Exam: General:  Chronically ill appearing. No resp difficulty HEENT: normal diffuse exfoliative rash Neck: supple. JVP to jaw . Carotids 2+ bilat; no bruits. No lymphadenopathy or thryomegaly appreciated. Cor: PMI nondisplaced. Regular rate & rhythm. No rubs,  or murmurs. + S3  Lungs: clear Abdomen: soft, nontender, +distended. No hepatosplenomegaly. No bruits or masses. Good bowel sounds. Extremities: no cyanosis, clubbing, rash trace 1+ leg edema  RUE infiltrate Neuro: alert & orientedx3, cranial nerves grossly intact. moves all 4 extremities  w/o difficulty. Affect pleasant Skin: Dry scales all over her body.   Telemetry: SR   Labs: Basic Metabolic Panel:  Recent Labs Lab 12/26/13 0600 12/27/13 0259 12/27/13 1125 12/28/13 0500 12/29/13 0439  NA 142 138 137 139 138  K 3.7 4.6 4.1 4.0 3.5*  CL 101 98 98 97 98  CO2 26 24 24 27 26   GLUCOSE 90 120* 168* 106* 146*  BUN 112* 109* 103* 101* 95*  CREATININE 3.01* 2.89* 2.98* 2.79* 2.44*  CALCIUM 8.5 8.2* 8.2* 8.1* 7.4*    Liver Function Tests:  Recent Labs Lab 12/29/13 0439  AST 21  ALT <5  ALKPHOS 101  BILITOT 0.9  PROT 5.6*  ALBUMIN 2.1*   No results for input(s): LIPASE, AMYLASE in the last 168 hours. No results for input(s): AMMONIA in the last 168 hours.  CBC:  Recent Labs Lab 12/25/13 0337 12/26/13 0600 12/28/13 0500  WBC 4.3 5.6 5.8  HGB 8.5* 10.2* 9.0*  HCT 26.3* 30.7* 27.1*  MCV 96.0 91.6 96.1  PLT 58* 43* 49*    Cardiac Enzymes: No results for input(s): CKTOTAL, CKMB, CKMBINDEX, TROPONINI in the last 168 hours.  BNP: BNP (last 3 results)  Recent Labs  10/27/13 1606 10/30/13 0531 12/21/13 2041  PROBNP 16497.0* 12300.0* 15092.0*     Other results:    Imaging: No results found.   Medications:     Scheduled Medications: . antiseptic oral rinse  7 mL Mouth Rinse BID  . aspirin EC  81 mg Oral Daily  .  ceFAZolin (ANCEF) IV  1 g Intravenous Q12H  . feeding supplement (GLUCERNA SHAKE)  237 mL Oral Daily  . furosemide  160 mg Intravenous Q6H  . hydrocerin   Topical BID  . insulin aspart  0-9 Units Subcutaneous TID WC  . isosorbide mononitrate  60 mg Oral Daily  . lubriderm seriously sensitive   Topical BID  . metolazone  5 mg Oral BID  . neomycin-bacitracin-polymyxin   Topical BID  . potassium chloride  20 mEq Oral TID  . sodium chloride  10-40 mL Intracatheter Q12H    Infusions: . milrinone 0.125 mcg/kg/min (12/30/13 0600)    PRN Medications: acetaminophen **OR** acetaminophen, camphor-menthol, hydrOXYzine,  ondansetron **OR** ondansetron (ZOFRAN) IV, oxyCODONE, sodium chloride, zolpidem   Assessment:  1. A/C Systolic Heart Failure. Ischemic cardiomyopathy, EF 30-35% with cardiorenal syndrome 2. Acute on chronic renal failure Stage IV: Not a good HD candidate.  3. DM2 4. H/O Gout 5. H/O GI bleed 10/2013  6. Diffuse scaly rash 7. MDS with pancytopenia   Plan/Discussion:    Remains on IV lasix, metolazone, and milrinone @ 0.125. Co-ox looks good. Volume status improved markedly. However CVP 13. Will continue IV lasix today.    Overall improving from HF perspective with significant support. However still with severe underlying illness and debility and overall prognosis remains quite poor. .   Discussion with son last week: He understands that we are making some progress, and they would want to continue current care. But if she begins to deteriorate they would reconsider aggressiveness of care.   Benay Spice 8:38 AM

## 2013-12-31 LAB — CBC
HEMATOCRIT: 26.5 % — AB (ref 36.0–46.0)
HEMOGLOBIN: 8.6 g/dL — AB (ref 12.0–15.0)
MCH: 32 pg (ref 26.0–34.0)
MCHC: 32.5 g/dL (ref 30.0–36.0)
MCV: 98.5 fL (ref 78.0–100.0)
Platelets: 63 10*3/uL — ABNORMAL LOW (ref 150–400)
RBC: 2.69 MIL/uL — ABNORMAL LOW (ref 3.87–5.11)
RDW: 21 % — ABNORMAL HIGH (ref 11.5–15.5)
WBC: 5.2 10*3/uL (ref 4.0–10.5)

## 2013-12-31 LAB — BASIC METABOLIC PANEL
Anion gap: 13 (ref 5–15)
BUN: 93 mg/dL — AB (ref 6–23)
CHLORIDE: 94 meq/L — AB (ref 96–112)
CO2: 30 mEq/L (ref 19–32)
Calcium: 8.1 mg/dL — ABNORMAL LOW (ref 8.4–10.5)
Creatinine, Ser: 3 mg/dL — ABNORMAL HIGH (ref 0.50–1.10)
GFR calc Af Amer: 18 mL/min — ABNORMAL LOW (ref >90)
GFR calc non Af Amer: 15 mL/min — ABNORMAL LOW (ref >90)
GLUCOSE: 99 mg/dL (ref 70–99)
Potassium: 4 mEq/L (ref 3.7–5.3)
Sodium: 137 mEq/L (ref 137–147)

## 2013-12-31 LAB — GLUCOSE, CAPILLARY
GLUCOSE-CAPILLARY: 88 mg/dL (ref 70–99)
GLUCOSE-CAPILLARY: 221 mg/dL — AB (ref 70–99)
Glucose-Capillary: 95 mg/dL (ref 70–99)
Glucose-Capillary: 138 mg/dL — ABNORMAL HIGH (ref 70–99)

## 2013-12-31 NOTE — Progress Notes (Signed)
Patient ID: [name] [name], female   DOB: 1950-07-26, 63 y.o.   MRN: [id number] Advanced Heart Failure Rounding Note   Subjective:    63 yo with history of CAD and systolic HF due to ischemic cardiomyopathy with EF 30-35% andpulmonary venous hypertension, myelodysplastic syndrome, GI bleed, and CKD . Admitted with volume overload and acute on chronic renal failure.   Continues on lasix 160 mg IV every 6 hours + milrinone 0.125 mcg. Metolazone 5 mg bid.  Continues with good diuresis. Weight stable however. Denies dyspnea. Creatinine starting to go back up  CVP now 10.    Objective:   Weight Range:  Vital Signs:   Temp:  [97.3 F (36.3 C)-98.4 F (36.9 C)] 98.1 F (36.7 C) (11/24 0804) Pulse Rate:  [72-81] 75 (11/24 0804) Resp:  [10-17] 11 (11/24 0804) BP: (98-118)/(46-71) 99/46 mmHg (11/24 0804) SpO2:  [98 %-100 %] 99 % (11/24 0804) Weight:  [54.5 kg (120 lb 2.4 oz)] 54.5 kg (120 lb 2.4 oz) (11/24 0500) Last BM Date: 12/30/13  Weight change: Filed Weights   12/29/13 0400 12/30/13 0500 12/31/13 0500  Weight: 58.7 kg (129 lb 6.6 oz) 54.2 kg (119 lb 7.8 oz) 54.5 kg (120 lb 2.4 oz)    Intake/Output:   Intake/Output Summary (Last 24 hours) at 12/31/13 1100 Last data filed at 12/31/13 0944  Gross per 24 hour  Intake  569.2 ml  Output   4850 ml  Net -4280.8 ml     Physical Exam: General:  Chronically ill appearing. No resp difficulty HEENT: normal diffuse exfoliative rash Neck: supple. JVP 10. Carotids 2+ bilat; no bruits. No lymphadenopathy or thryomegaly appreciated. Cor: PMI nondisplaced. Regular rate & rhythm. No rubs,  or murmurs. + S3  Lungs: clear Abdomen: soft, nontender, +distended. No hepatosplenomegaly. No bruits or masses. Good bowel sounds. Extremities: no cyanosis, clubbing, rash trace tr leg edema  RUE infiltrate Neuro: alert & orientedx3, cranial nerves grossly intact. moves all 4 extremities w/o difficulty. Affect pleasant Skin: Dry scales all over her body.    Telemetry: SR   Labs: Basic Metabolic Panel:  Recent Labs Lab 12/27/13 0259 12/27/13 1125 12/28/13 0500 12/29/13 0439 12/31/13 0500  NA 138 137 139 138 137  K 4.6 4.1 4.0 3.5* 4.0  CL 98 98 97 98 94*  CO2 24 24 27 26 30   GLUCOSE 120* 168* 106* 146* 99  BUN 109* 103* 101* 95* 93*  CREATININE 2.89* 2.98* 2.79* 2.44* 3.00*  CALCIUM 8.2* 8.2* 8.1* 7.4* 8.1*    Liver Function Tests:  Recent Labs Lab 12/29/13 0439  AST 21  ALT <5  ALKPHOS 101  BILITOT 0.9  PROT 5.6*  ALBUMIN 2.1*   No results for input(s): LIPASE, AMYLASE in the last 168 hours. No results for input(s): AMMONIA in the last 168 hours.  CBC:  Recent Labs Lab 12/25/13 0337 12/26/13 0600 12/28/13 0500 12/30/13 0900 12/31/13 0500  WBC 4.3 5.6 5.8 5.2 5.2  HGB 8.5* 10.2* 9.0* 8.4* 8.6*  HCT 26.3* 30.7* 27.1* 24.9* 26.5*  MCV 96.0 91.6 96.1 97.3 98.5  PLT 58* 43* 49* 55* 63*    Cardiac Enzymes: No results for input(s): CKTOTAL, CKMB, CKMBINDEX, TROPONINI in the last 168 hours.  BNP: BNP (last 3 results)  Recent Labs  10/27/13 1606 10/30/13 0531 12/21/13 2041  PROBNP 16497.0* 12300.0* 15092.0*     Other results:    Imaging: No results found.   Medications:     Scheduled Medications: . antiseptic oral rinse  7  mL Mouth Rinse BID  . aspirin EC  81 mg Oral Daily  .  ceFAZolin (ANCEF) IV  1 g Intravenous Q12H  . feeding supplement (GLUCERNA SHAKE)  237 mL Oral Daily  . furosemide  160 mg Intravenous Q6H  . hydrocerin   Topical BID  . insulin aspart  0-15 Units Subcutaneous TID WC  . insulin aspart  0-5 Units Subcutaneous QHS  . isosorbide mononitrate  60 mg Oral Daily  . lubriderm seriously sensitive   Topical BID  . metolazone  5 mg Oral BID  . neomycin-bacitracin-polymyxin   Topical BID  . potassium chloride  20 mEq Oral TID  . sodium chloride  10-40 mL Intracatheter Q12H    Infusions: . milrinone 0.125 mcg/kg/min (12/30/13 0600)    PRN Medications: acetaminophen  **OR** acetaminophen, camphor-menthol, hydrOXYzine, ondansetron **OR** ondansetron (ZOFRAN) IV, oxyCODONE, sodium chloride, zolpidem   Assessment:  1. A/C Systolic Heart Failure. Ischemic cardiomyopathy, EF 30-35% with cardiorenal syndrome 2. Acute on chronic renal failure Stage IV: Not a good HD candidate.  3. DM2 4. H/O Gout 5. H/O GI bleed 10/2013  6. Diffuse scaly rash 7. MDS with pancytopenia   Plan/Discussion:    Volume status much improved. Renal function now getting worse again. Will stop diuretics. Continue milrinone for now. I think this is about as good as we can get her HF.   Unfortunately she still has severe underlying illness and debility and overall prognosis remains quite poor. Will try to transition to po HF meds over next 2-3 days.   Discussion with son last week: He understands that we are making some progress, and they would want to continue current care. But if she begins to deteriorate they would reconsider aggressiveness of care. At some point may be worth biopsying rash to make sure not cutaneous malignancy.  Daniel Bensimhon,MD 11:00 AM

## 2013-12-31 NOTE — Progress Notes (Signed)
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 On admission, patient was found to be in acute heart failure with ascites-similar to past admissions. She was also noted to have a creatinine of 3.45 and was hypothermic requiring Customer service manager. Patient placed in stepdown unit and started on IV Lasix. Initially there was concern about sepsis and antibiotics started, discontinued the following morning as no immediate signs of infection noted  11/16 she had not been responding very well to Lasix alone. Seen by nephrology and cardiology. Creatinine progressively worsening. Through son, patient declined central line needed for IV milrinone but Cardiology was able to have PICC placed instead since she is NOT a candidate for HD. She has continued to have issues with hypothermia requiring Retail banker.  11/17 outpt notes from oncology reviewed. Prognosis documented as poor and NOT candidate for systemic treatment of high-grade myelodysplastic syndrome. Dr. Alvy Bimler recommended the option of Hospice but also felt the family at that time was not ready. She also recommended supportive care and to transfuse if Hgb </= 8.0.  HPI/Subjective: Alert and without complaints  Assessment/Plan:    Acute on chronic combined systolic and diastolic congestive heart failure/ICM/ Ascites -Cont IV Lasix and Milrinone,(per Cards) -neg 15,000 cc since admission-weights have been inconsistent -Appreciate cardiology assistance -PICC line in chest  -ascites from  passive hepatic congestion; Interventional radiology performed therapeutic paracentesis with 1.1 L bloody fluid removed -stable on Imdur and KCL    Acute renal failure on CKD, stage IV with cardiorenal syndrome -Appreciate nephrology assistance -Scr slowly trending down -NOT a candidate for HD-nephrology has signed off -Palliative has evaluated this admission    Essential hypertension -BP soft and due to recent episodes of hypotension hydralazine was dc'd -BP stable with diuresis    Hyperlipidemia -statin was dc'd in setting of elevated LFTs -repeat prior to discharge    Type 2 diabetes mellitus with peripheral neuropathy -CBGs were trending up as intake improved so increased to moderate SSI  -CBGs now better controlled -HgbA1c was 6.2 Sept 2015      Protein calorie malnutrition -Nutrition following-intake improving    Anemia 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) -MDS driving sx anemia -per onco OP notes rec is to transfuse for Hgb </= 8.0 -11/17 Hgb 7.0 so was transfused 2 units PRBCs each over 4 hours -current hgb stable  -severe MDS alone offers dire prognosis-Palliative eval in progress   Exfoliative dermatitis -suspect less likely drug rash -Neosporin for excoriated wounds; lotions to intact skin -Added IV Ancef as prophylaxis to prevent diffuse cellulitis -no antibiotic disc over PICC site due to causes inflammation    DVT prophylaxis: SCDs Code Status: Full Family Communication: No family at bedside Disposition Plan/Expected LOS: SDU   Consultants: Cardiology/ Dr. Haroldine Laws Nephrology/Dr. Posey Pronto Palliative Care/Dr. Deitra Mayo  Procedures: Therapeutic paracentesis 11/16 IR  Antibiotics: Zosyn 11/14  vancomycin 11/14  Ancef 11/19 >  Objective: Blood pressure 99/46, pulse 75, temperature 98.1 F (36.7 C), temperature source Oral, resp. rate 11, height 5' (1.524 m), weight 120 lb 2.4 oz (54.5 kg), SpO2 99 %.  Intake/Output Summary (Last 24 hours) at  12/31/13 1112  Last data filed at 12/31/13 0944  Gross per 24 hour  Intake  569.2 ml  Output   4850 ml  Net -4280.8 ml   Exam: Gen: No acute respiratory distress Chest: Clear to auscultation bilaterally, room air Cardiac: Regular rate and rhythm, C5-E5, pansystolic murmur left sternal border fourth intercostal space, no rubs or gallops, mild JVD, stable trace bilateral lower extremity edema Abdomen: Soft nontender nondistended without obvious hepatosplenomegaly, no ascites Extremities: Symmetrical in appearance without cyanosis, clubbing or effusion Skin: Improving exfoliating dermatitis without any active bullae or drainage  Scheduled Meds:  Scheduled Meds: . antiseptic oral rinse  7 mL Mouth Rinse BID  . aspirin EC  81 mg Oral Daily  .  ceFAZolin (ANCEF) IV  1 g Intravenous Q12H  . feeding supplement (GLUCERNA SHAKE)  237 mL Oral Daily  . furosemide  160 mg Intravenous Q6H  . hydrocerin   Topical BID  . insulin aspart  0-15 Units Subcutaneous TID WC  . insulin aspart  0-5 Units Subcutaneous QHS  . isosorbide mononitrate  60 mg Oral Daily  . lubriderm seriously sensitive   Topical BID  . metolazone  5 mg Oral BID  . neomycin-bacitracin-polymyxin   Topical BID  . potassium chloride  20 mEq Oral TID  . sodium chloride  10-40 mL Intracatheter Q12H   Continuous Infusions: . milrinone 0.125 mcg/kg/min (12/30/13 0600)    Data Reviewed: Basic Metabolic Panel:  Recent Labs Lab 12/27/13 0259 12/27/13 1125 12/28/13 0500 12/29/13 0439 12/31/13 0500  NA 138 137 139 138 137  K 4.6 4.1 4.0 3.5* 4.0  CL 98 98 97 98 94*  CO2 24 24 27 26 30   GLUCOSE 120* 168* 106* 146* 99  BUN 109* 103* 101* 95* 93*  CREATININE 2.89* 2.98* 2.79* 2.44* 3.00*  CALCIUM 8.2* 8.2* 8.1* 7.4* 8.1*   Liver Function Tests:  Recent Labs Lab 12/29/13 0439  AST 21  ALT <5  ALKPHOS 101  BILITOT 0.9  PROT 5.6*  ALBUMIN 2.1*   CBC:  Recent Labs Lab 12/25/13 0337 12/26/13 0600 12/28/13 0500  12/30/13 0900 12/31/13 0500  WBC 4.3 5.6 5.8 5.2 5.2  HGB 8.5* 10.2* 9.0* 8.4* 8.6*  HCT 26.3* 30.7* 27.1* 24.9* 26.5*  MCV 96.0 91.6 96.1 97.3 98.5  PLT 58* 43* 49* 55* 63*   BNP (last 3 results)  Recent Labs  10/27/13 1606 10/30/13 0531 12/21/13 2041  PROBNP 16497.0* 12300.0* 15092.0*   CBG:  Recent Labs Lab 12/30/13 0805 12/30/13 1232 12/30/13 1708 12/30/13 2130 12/31/13 0802  GLUCAP 94 164* 128* 207* 88    Recent Results (from the past 240 hour(s))  Blood culture (routine x 2)     Status: None   Collection Time: 12/21/13  9:24 PM  Result Value Ref Range Status   Specimen Description BLOOD RIGHT ARM  Final   Special Requests BOTTLES DRAWN AEROBIC AND ANAEROBIC 6CC EA  Final   Culture  Setup Time   Final    12/22/2013 19:28 Performed at [facility]    Culture   Final    NO GROWTH 5 DAYS Note: Culture results may be compromised due to an excessive volume of blood received in culture bottles. Performed at [facility]    Report Status 12/28/2013 FINAL  Final  Blood culture (routine x 2)     Status: None   Collection Time: 12/21/13  9:26 PM  Result Value Ref Range Status   Specimen Description BLOOD LEFT HAND  Final  Special Requests BOTTLES DRAWN AEROBIC ONLY 10CC  Final   Culture  Setup Time   Final    12/22/2013 19:27 Performed at [facility]    Culture   Final    NO GROWTH 5 DAYS Note: Culture results may be compromised due to an excessive volume of blood received in culture bottles. Performed at [facility]    Report Status 12/28/2013 FINAL  Final  Urine culture     Status: None   Collection Time: 12/21/13 10:06 PM  Result Value Ref Range Status   Specimen Description URINE, CATHETERIZED  Final   Special Requests NONE  Final   Culture  Setup Time   Final    12/22/2013 20:36 Performed at [facility] Performed at [facility]   Final   Culture NO GROWTH Performed  at [facility]   Final   Report Status 12/24/2013 FINAL  Final  MRSA PCR Screening     Status: None   Collection Time: 12/22/13  4:20 AM  Result Value Ref Range Status   MRSA by PCR NEGATIVE NEGATIVE Final    Comment:        The GeneXpert MRSA Assay (FDA approved for NASAL specimens only), is one component of a comprehensive MRSA colonization surveillance program. It is not intended to diagnose MRSA infection nor to guide or monitor treatment for MRSA infections.   Clostridium Difficile by PCR     Status: None   Collection Time: 12/23/13  1:59 AM  Result Value Ref Range Status   C difficile by pcr NEGATIVE NEGATIVE Final     Studies:  Recent x-ray studies have been reviewed in detail by the Attending Physician  Time spent :  [place], ANP [facility] Office  [phone] Pager [phone]  On-Call/Text Page:      [email or web address]      password [email or web address]  If 7PM-7AM, please contact night-coverage [email or web address] Password [email or web address] 12/31/2013, 11:12 AM   LOS: 10 days   Examined patient and discussed assessment and plan with ANP Ebony Hail and agree with above. Patient with multiple complex medical issues> 40 minutes spent in direct patient care

## 2014-01-01 DIAGNOSIS — R630 Anorexia: Secondary | ICD-10-CM | POA: Insufficient documentation

## 2014-01-01 LAB — GLUCOSE, CAPILLARY
GLUCOSE-CAPILLARY: 146 mg/dL — AB (ref 70–99)
Glucose-Capillary: 105 mg/dL — ABNORMAL HIGH (ref 70–99)
Glucose-Capillary: 153 mg/dL — ABNORMAL HIGH (ref 70–99)
Glucose-Capillary: 201 mg/dL — ABNORMAL HIGH (ref 70–99)

## 2014-01-01 LAB — BASIC METABOLIC PANEL
ANION GAP: 13 (ref 5–15)
BUN: 89 mg/dL — ABNORMAL HIGH (ref 6–23)
CALCIUM: 8.4 mg/dL (ref 8.4–10.5)
CO2: 32 meq/L (ref 19–32)
CREATININE: 2.81 mg/dL — AB (ref 0.50–1.10)
Chloride: 93 mEq/L — ABNORMAL LOW (ref 96–112)
GFR calc Af Amer: 19 mL/min — ABNORMAL LOW (ref >90)
GFR calc non Af Amer: 17 mL/min — ABNORMAL LOW (ref >90)
Glucose, Bld: 113 mg/dL — ABNORMAL HIGH (ref 70–99)
Potassium: 3.7 mEq/L (ref 3.7–5.3)
Sodium: 138 mEq/L (ref 137–147)

## 2014-01-01 LAB — CBC
HCT: 26.3 % — ABNORMAL LOW (ref 36.0–46.0)
Hemoglobin: 8.5 g/dL — ABNORMAL LOW (ref 12.0–15.0)
MCH: 31 pg (ref 26.0–34.0)
MCHC: 32.3 g/dL (ref 30.0–36.0)
MCV: 96 fL (ref 78.0–100.0)
PLATELETS: 73 10*3/uL — AB (ref 150–400)
RBC: 2.74 MIL/uL — ABNORMAL LOW (ref 3.87–5.11)
RDW: 20.3 % — ABNORMAL HIGH (ref 11.5–15.5)
WBC: 7.6 10*3/uL (ref 4.0–10.5)

## 2014-01-01 LAB — CARBOXYHEMOGLOBIN
Carboxyhemoglobin: 2.4 % — ABNORMAL HIGH (ref 0.5–1.5)
Methemoglobin: 0.8 % (ref 0.0–1.5)
O2 SAT: 99.4 %
TOTAL HEMOGLOBIN: 9 g/dL — AB (ref 12.0–16.0)

## 2014-01-01 MED ORDER — TORSEMIDE 20 MG PO TABS
40.0000 mg | ORAL_TABLET | Freq: Two times a day (BID) | ORAL | Status: DC
  Administered 2014-01-02 – 2014-01-04 (×5): 40 mg via ORAL
  Filled 2014-01-01 (×7): qty 2

## 2014-01-01 MED ORDER — HYDRALAZINE HCL 25 MG PO TABS
12.5000 mg | ORAL_TABLET | Freq: Three times a day (TID) | ORAL | Status: DC
  Administered 2014-01-01 – 2014-01-07 (×15): 12.5 mg via ORAL
  Filled 2014-01-01 (×22): qty 0.5

## 2014-01-01 MED ORDER — ISOSORBIDE MONONITRATE ER 30 MG PO TB24
30.0000 mg | ORAL_TABLET | Freq: Every day | ORAL | Status: DC
Start: 2014-01-01 — End: 2014-01-10
  Administered 2014-01-01 – 2014-01-10 (×10): 30 mg via ORAL
  Filled 2014-01-01 (×10): qty 1

## 2014-01-01 MED ORDER — MIRTAZAPINE 15 MG PO TABS
15.0000 mg | ORAL_TABLET | Freq: Every day | ORAL | Status: DC
  Administered 2014-01-01 – 2014-01-09 (×9): 15 mg via ORAL
  Filled 2014-01-01 (×10): qty 1

## 2014-01-01 NOTE — Progress Notes (Signed)
Patient [name] [name]      DOB: 1950/04/24      EXH:[id number]   Palliative Medicine Team at [facility] Progress Note    Subjective: Rash/itching doing better.  Appetite poor.  No other complaints.    Filed Vitals:   01/01/14 1215  BP: 121/56  Pulse: 105  Temp: 97.9 F (36.6 C)  Resp: 18   Physical exam: Gen: alert, NAD HEENT: [place], sclera anicteric CV: RRR LUNGS: CTAB ABD: soft, ND Skin: improving exfoliative dermatitis.  CBC    Component Value Date/Time   WBC 7.6 01/01/2014 0500   WBC 4.8 12/11/2013 0940   WBC 5.2 03/05/2013 1916   RBC 2.74* 01/01/2014 0500   RBC 2.62* 12/11/2013 0940   RBC 3.95* 03/05/2013 1916   RBC 2.90* 08/22/2012 0907   HGB 8.5* 01/01/2014 0500   HGB 8.5* 12/11/2013 0940   HGB 11.3* 03/05/2013 1916   HCT 26.3* 01/01/2014 0500   HCT 26.2* 12/11/2013 0940   HCT 37.5* 03/05/2013 1916   PLT 73* 01/01/2014 0500   PLT 51* 12/11/2013 0940   MCV 96.0 01/01/2014 0500   MCV 99.9 12/11/2013 0940   MCV 95.0 03/05/2013 1916   MCH 31.0 01/01/2014 0500   MCH 32.4 12/11/2013 0940   MCH 28.6 03/05/2013 1916   MCHC 32.3 01/01/2014 0500   MCHC 32.4 12/11/2013 0940   MCHC 30.1* 03/05/2013 1916   RDW 20.3* 01/01/2014 0500   RDW 23.5* 12/11/2013 0940   LYMPHSABS 0.4* 12/21/2013 2041   LYMPHSABS 0.4* 12/11/2013 0940   MONOABS 0.4 12/21/2013 2041   MONOABS 0.3 12/11/2013 0940   EOSABS 1.0* 12/21/2013 2041   EOSABS 2.5* 12/11/2013 0940   BASOSABS 0.0 12/21/2013 2041   BASOSABS 0.0 12/11/2013 0940    CMP     Component Value Date/Time   NA 138 01/01/2014 0500   NA 129* 11/12/2013 1205   K 3.7 01/01/2014 0500   K 4.2 11/12/2013 1205   CL 93* 01/01/2014 0500   CO2 32 01/01/2014 0500   CO2 25 11/12/2013 1205   GLUCOSE 113* 01/01/2014 0500   GLUCOSE 169* 11/12/2013 1205   BUN 89* 01/01/2014 0500   BUN 95.4* 11/12/2013 1205   CREATININE 2.81* 01/01/2014 0500   CREATININE 2.2* 11/12/2013 1205   CREATININE 1.57* 01/27/2013 1315   CALCIUM 8.4  01/01/2014 0500   CALCIUM 8.8 11/12/2013 1205   PROT 5.6* 12/29/2013 0439   PROT 7.7 11/12/2013 1205   ALBUMIN 2.1* 12/29/2013 0439   ALBUMIN 2.9* 11/12/2013 1205   AST 21 12/29/2013 0439   AST 24 11/12/2013 1205   ALT <5 12/29/2013 0439   ALT 22 11/12/2013 1205   ALKPHOS 101 12/29/2013 0439   ALKPHOS 174* 11/12/2013 1205   BILITOT 0.9 12/29/2013 0439   BILITOT 1.14 11/12/2013 1205   GFRNONAA 17* 01/01/2014 0500   GFRAA 19* 01/01/2014 0500      Assessment and plan: 63 yo [name] with PMHx of systolic HF, Pulm HTN, CKD, MDS who presented with volume overload. Multiple hospitalizations this year. Palliative care consulted for goals of care.   1. Code Status: Full (as per prior documentation)  2. Goals of Care: See previous documentation by myself and Dr Hilma Favors. Spoke briefly with daughter at bedsdie though she defers to her brother and ask that i call him. She believes he will not be in until ~8PM tonight.  I tried calling him but only got his voicemail.  I have left message and will plan on following up on  Friday.    3. Symptom Management:  1. Loss of Appetite- will trial remeron 2. Pruritis- PRN hydroxyzine.I will add remeron as above. It has some anti-histamine properties which may also have small benefit on pruritis.    4. Psychosocial/Spiritual: [name], lives at home with 2 sons and daughter.   Total Time:7minutes  Greater than 50% of this time was spent counseling and coordinating care related to the above assessment and plan.   Doran Clay D.O. Palliative Medicine Team at [facility]  Pager: [phone] Team Phone: [phone]

## 2014-01-01 NOTE — Progress Notes (Signed)
Physical Therapy Treatment Patient Details Name: [name] [name] MRN: [id number] DOB: 11/14/1950 Today's Date: 01/01/2014    History of Present Illness 63 yo with history of CAD and systolic HF and pulmonary venous hypertension, myelodysplastic syndrome, GI bleed, and CKD . Admitted with volume overload and acute on chronic renal failure.     PT Comments    Patient with slow progress with hallway ambulation due to continued fecal incontinence.  May benefit from protective wear to allow increased ambulation.  She did inform me of it as soon as she stood and was able to remain standing even without support briefly while PT stepped to sink to get cloths for cleansing.  Will hopefully have family assist available for mobilizing with pt at d/c.    Follow Up Recommendations  No PT follow up;Supervision for mobility/OOB     Equipment Recommendations  Rolling walker with 5" wheels (if she doesn't already have one)    Recommendations for Other Services       Precautions / Restrictions Precautions Precautions: Fall Precaution Comments: fecal incontinence Restrictions Weight Bearing Restrictions: No    Mobility  Bed Mobility Overal bed mobility: Needs Assistance Bed Mobility: Supine to Sit     Supine to sit: HOB elevated;Min assist;Mod assist     General bed mobility comments: assist to lift trunk and scoot to edge of bed  Transfers Overall transfer level: Needs assistance Equipment used: Rolling walker (2 wheeled) Transfers: Sit to/from Stand Sit to Stand: Min assist         General transfer comment: from bed assist for stability  Ambulation/Gait Ambulation/Gait assistance: Min assist Ambulation Distance (Feet): 40 Feet Assistive device: Rolling walker (2 wheeled) Gait Pattern/deviations: Step-through pattern;Decreased stride length;Narrow base of support     General Gait Details: limited distance due to fecal incontinence   Stairs            Wheelchair Mobility    Modified Rankin (Stroke Patients Only)       Balance Overall balance assessment: Needs assistance           Standing balance-Leahy Scale: Poor Standing balance comment: supervision with UE assist during hygiene                    Cognition Arousal/Alertness: Awake/alert Behavior During Therapy: WFL for tasks assessed/performed Overall Cognitive Status: Difficult to assess                      Exercises      General Comments        Pertinent Vitals/Pain Faces Pain Scale: No hurt    Home Living                      Prior Function            PT Goals (current goals can now be found in the care plan section) Progress towards PT goals: Progressing toward goals    Frequency  Min 3X/week    PT Plan Current plan remains appropriate    Co-evaluation             End of Session Equipment Utilized During Treatment: Gait belt Activity Tolerance: Other (comment) (limited due to incontinence) Patient left: in chair;with nursing/sitter in room     Time: 7494-4967 PT Time Calculation (min) (ACUTE ONLY): 27 min  Charges:  $Gait Training: 8-22 mins $Therapeutic Activity: 8-22 mins  G Codes:      Dearia [place],CYNDI 01/01/2014, 10:03 AM Magda Kiel, PT [phone] 01/01/2014

## 2014-01-01 NOTE — Progress Notes (Signed)
Patient ID: [name] [name], female   DOB: 06-03-1950, 63 y.o.   MRN: [id number] Advanced Heart Failure Rounding Note   Subjective:    63 yo with history of CAD and systolic HF due to ischemic cardiomyopathy with EF 30-35% andpulmonary venous hypertension, myelodysplastic syndrome, GI bleed, and CKD . Admitted with volume overload and acute on chronic renal failure.   Lasix stopped yesterday due to rising creatinine. Still diuresing briskly. Creatinine slightly better today 3.0->2.8. Denies dyspnea. Weight up 1 pound. CVP 12-13    Objective:   Weight Range:  Vital Signs:   Temp:  [98 F (36.7 C)-99 F (37.2 C)] 99 F (37.2 C) (11/25 0825) Pulse Rate:  [77-98] 88 (11/25 0825) Resp:  [12-19] 19 (11/25 0825) BP: (103-122)/(37-54) 103/45 mmHg (11/25 0825) SpO2:  [95 %-100 %] 95 % (11/25 0825) Weight:  [55.2 kg (121 lb 11.1 oz)] 55.2 kg (121 lb 11.1 oz) (11/25 0500) Last BM Date: 12/31/13  Weight change: Filed Weights   12/30/13 0500 12/31/13 0500 01/01/14 0500  Weight: 54.2 kg (119 lb 7.8 oz) 54.5 kg (120 lb 2.4 oz) 55.2 kg (121 lb 11.1 oz)    Intake/Output:   Intake/Output Summary (Last 24 hours) at 01/01/14 0840 Last data filed at 01/01/14 0800  Gross per 24 hour  Intake  217.6 ml  Output   2300 ml  Net -2082.4 ml     Physical Exam: General:  Chronically ill appearing. No resp difficulty HEENT: normal diffuse exfoliative rash Neck: supple. JVP 12. Carotids 2+ bilat; no bruits. No lymphadenopathy or thryomegaly appreciated. Cor: PMI nondisplaced. Regular rate & rhythm. No rubs,  or murmurs. + S3  Lungs: clear Abdomen: soft, nontender, +distended. No hepatosplenomegaly. No bruits or masses. Good bowel sounds. Extremities: no cyanosis, clubbing, rash trace tr leg edema  RUE infiltrate Neuro: alert & orientedx3, cranial nerves grossly intact. moves all 4 extremities w/o difficulty. Affect pleasant Skin: Dry scales all over her body.   Telemetry: SR   Labs: Basic Metabolic  Panel:  Recent Labs Lab 12/27/13 1125 12/28/13 0500 12/29/13 0439 12/31/13 0500 01/01/14 0500  NA 137 139 138 137 138  K 4.1 4.0 3.5* 4.0 3.7  CL 98 97 98 94* 93*  CO2 24 27 26 30  32  GLUCOSE 168* 106* 146* 99 113*  BUN 103* 101* 95* 93* 89*  CREATININE 2.98* 2.79* 2.44* 3.00* 2.81*  CALCIUM 8.2* 8.1* 7.4* 8.1* 8.4    Liver Function Tests:  Recent Labs Lab 12/29/13 0439  AST 21  ALT <5  ALKPHOS 101  BILITOT 0.9  PROT 5.6*  ALBUMIN 2.1*   No results for input(s): LIPASE, AMYLASE in the last 168 hours. No results for input(s): AMMONIA in the last 168 hours.  CBC:  Recent Labs Lab 12/26/13 0600 12/28/13 0500 12/30/13 0900 12/31/13 0500 01/01/14 0500  WBC 5.6 5.8 5.2 5.2 7.6  HGB 10.2* 9.0* 8.4* 8.6* 8.5*  HCT 30.7* 27.1* 24.9* 26.5* 26.3*  MCV 91.6 96.1 97.3 98.5 96.0  PLT 43* 49* 55* 63* 73*    Cardiac Enzymes: No results for input(s): CKTOTAL, CKMB, CKMBINDEX, TROPONINI in the last 168 hours.  BNP: BNP (last 3 results)  Recent Labs  10/27/13 1606 10/30/13 0531 12/21/13 2041  PROBNP 16497.0* 12300.0* 15092.0*     Other results:    Imaging: No results found.   Medications:     Scheduled Medications: . antiseptic oral rinse  7 mL Mouth Rinse BID  . aspirin EC  81 mg Oral Daily  .  ceFAZolin (ANCEF) IV  1 g Intravenous Q12H  . feeding supplement (GLUCERNA SHAKE)  237 mL Oral Daily  . hydrocerin   Topical BID  . insulin aspart  0-15 Units Subcutaneous TID WC  . insulin aspart  0-5 Units Subcutaneous QHS  . isosorbide mononitrate  60 mg Oral Daily  . lubriderm seriously sensitive   Topical BID  . neomycin-bacitracin-polymyxin   Topical BID  . sodium chloride  10-40 mL Intracatheter Q12H    Infusions: . milrinone 0.125 mcg/kg/min (01/01/14 0800)    PRN Medications: acetaminophen **OR** acetaminophen, camphor-menthol, hydrOXYzine, ondansetron **OR** ondansetron (ZOFRAN) IV, oxyCODONE, sodium chloride, zolpidem   Assessment:    1. A/C Systolic Heart Failure. Ischemic cardiomyopathy, EF 30-35% with cardiorenal syndrome 2. Acute on chronic renal failure Stage IV: Not a good HD candidate.  3. DM2 4. H/O Gout 5. H/O GI bleed 10/2013  6. Diffuse scaly rash 7. MDS with pancytopenia   Plan/Discussion:    Volume status much improved. Renal function slightly better after stopping diuretics. CVP still up and remains 5-10 pounds heavier than previous discharge weight but renal function doesn't seem to permit more diuresis currently. Will hold diuretics today and resume demadex 40 bid tomorrow. Continue milrinone one more day. Can pull Foley. Have PT see. Add back low-dose hydralazine. No b-blocker or ACe-I at this point due to renal failure and hypotension.   Unfortunately she still has severe underlying illness and debility and overall prognosis remains quite poor.   Larnce Schnackenberg,MD 8:40 AM

## 2014-01-01 NOTE — Progress Notes (Signed)
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 On admission, patient was found to be in acute heart failure with ascites-similar to past admissions. She was also noted to have a creatinine of 3.45 and was hypothermic requiring Customer service manager. Patient placed in stepdown unit and started on IV Lasix. Initially there was concern about sepsis and antibiotics started, discontinued the following morning as no immediate signs of infection noted  11/16 she had not been responding very well to Lasix alone. Seen by nephrology and cardiology. Creatinine progressively worsening. Through son, patient declined central line needed for IV milrinone but Cardiology was able to have PICC placed instead since she is NOT a candidate for HD. She has continued to have issues with hypothermia requiring Retail banker.  11/17 outpt notes from oncology reviewed. Prognosis documented as poor and NOT candidate for systemic treatment of high-grade myelodysplastic syndrome. Dr. Alvy Bimler recommended the option of Hospice but also felt the family at that time was not ready. She also recommended supportive care and to transfuse if Hgb </= 8.0.  Patient has had excellent diuretic response after milrinone initiated in combination with Lasix and metolazone. By 11/24 her creatinine began to rise so the Lasix and metolazone were discontinued. Anticipate discontinuation of milrinone on 11/26. Cardiology plans to begin oral Demadex on 11/26.  HPI/Subjective: Up in chair eating breakfast. No  complaints.  Assessment/Plan:  Acute on chronic combined systolic and diastolic congestive heart failure/ICM/ Ascites -Medication adjustments per cardiology: Lasix and metolazone discontinued 11/25; milrinone to continue at least through 11/26; transition to oral Demadex 11/26-also on Imdur for afterload reduction and potassium while on higher dose Lasix -neg 21,000+ cc since admission-weights have been inconsistent -PICC line in chest  -ascites from passive hepatic congestion; Interventional radiology performed therapeutic paracentesis with 1.1 L bloody fluid removed  Acute renal failure on CKD, stage IV with cardiorenal syndrome -Nephrology has seen - they signed off 11/18 -Scr trend back up to 3.0 with a BUN of 93-trend back down to 89/2.81 with discontinuation of Lasix and metolazone on 11/24 -NOT a candidate for HD -Palliative has evaluated this admission  Essential hypertension -Due to hypotension Hydralazine had been held -Cardiology resumed 11/25  Hyperlipidemia -statin was dc'd in setting of elevated LFTs  Type 2 diabetes mellitus with peripheral neuropathy -CBGs had trended up as intake improved so increased to moderate SSI 11/23 -CBGs now better controlled -HgbA1c was 6.2 Sept 2015    Protein calorie malnutrition -Nutrition following-intake improving  Anemia 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) -MDS driving sx anemia -per Onco OP notes rec is to transfuse for Hgb </= 8.0 -11/17 Hgb 7.0 so was transfused 2 units PRBCs each over 4 hours -current hgb stable and greater than 8.0 -severe MDS alone offers dire prognosis-Palliative eval in progress  Exfoliative dermatitis -Not consistent with drug rash -Utilized Neosporin for excoriated wounds; lotions to intact skin with improvement and dermatitis symptoms -Added IV Ancef as prophylaxis to prevent diffuse cellulitis-today is day 7 so we'll discontinue -no antibiotic disc over PICC site due to causes  inflammation    DVT prophylaxis: SCDs Code Status: Full Family Communication: No family  at bedside Disposition Plan/Expected LOS: SDU-once milrinone discontinued, if remains stable anticipate transfer to telemetry unit-will need to discharge to skilled nursing facility when medically stable   Consultants: Cardiology/ Dr. Haroldine Laws Nephrology/Dr. Posey Pronto Palliative Care/Dr. Deitra Mayo  Procedures: Therapeutic paracentesis 11/16 IR  Antibiotics: Zosyn 11/14  Vancomycin 11/14  Ancef 11/19 > 11/25  Objective: Blood pressure 121/56, pulse 105, temperature 97.9 F (36.6 C), temperature source Oral, resp. rate 18, height 5' (1.524 m), weight 121 lb 11.1 oz (55.2 kg), SpO2 97 %.  Intake/Output Summary (Last 24 hours) at 01/01/14 1328 Last data filed at 01/01/14 1100  Gross per 24 hour  Intake   84.8 ml  Output   3050 ml  Net -2965.2 ml   Exam: Gen: No acute respiratory distress - smiling, pleasant  Chest: Clear to auscultation bilaterally, room air Cardiac: Regular rate and rhythm, H4-R7, pansystolic murmur left sternal border fourth intercostal space, no rubs or gallops, mild JVD, stable trace bilateral lower extremity edema Abdomen: Soft nontender nondistended without obvious hepatosplenomegaly, no ascites Extremities: Symmetrical in appearance without cyanosis, clubbing or effusion Skin: Improving exfoliating dermatitis without any active bullae or drainage  Scheduled Meds:  Scheduled Meds: . antiseptic oral rinse  7 mL Mouth Rinse BID  . aspirin EC  81 mg Oral Daily  .  ceFAZolin (ANCEF) IV  1 g Intravenous Q12H  . feeding supplement (GLUCERNA SHAKE)  237 mL Oral Daily  . hydrALAZINE  12.5 mg Oral 3 times per day  . hydrocerin   Topical BID  . insulin aspart  0-15 Units Subcutaneous TID WC  . insulin aspart  0-5 Units Subcutaneous QHS  . isosorbide mononitrate  30 mg Oral Daily  . lubriderm seriously sensitive   Topical BID  . mirtazapine  15 mg Oral QHS  .  neomycin-bacitracin-polymyxin   Topical BID  . sodium chloride  10-40 mL Intracatheter Q12H  . [START ON 01/02/2014] torsemide  40 mg Oral BID   Continuous Infusions: . milrinone 0.125 mcg/kg/min (01/01/14 0800)    Data Reviewed: Basic Metabolic Panel:  Recent Labs Lab 12/27/13 1125 12/28/13 0500 12/29/13 0439 12/31/13 0500 01/01/14 0500  NA 137 139 138 137 138  K 4.1 4.0 3.5* 4.0 3.7  CL 98 97 98 94* 93*  CO2 24 27 26 30  32  GLUCOSE 168* 106* 146* 99 113*  BUN 103* 101* 95* 93* 89*  CREATININE 2.98* 2.79* 2.44* 3.00* 2.81*  CALCIUM 8.2* 8.1* 7.4* 8.1* 8.4   Liver Function Tests:  Recent Labs Lab 12/29/13 0439  AST 21  ALT <5  ALKPHOS 101  BILITOT 0.9  PROT 5.6*  ALBUMIN 2.1*   CBC:  Recent Labs Lab 12/26/13 0600 12/28/13 0500 12/30/13 0900 12/31/13 0500 01/01/14 0500  WBC 5.6 5.8 5.2 5.2 7.6  HGB 10.2* 9.0* 8.4* 8.6* 8.5*  HCT 30.7* 27.1* 24.9* 26.5* 26.3*  MCV 91.6 96.1 97.3 98.5 96.0  PLT 43* 49* 55* 63* 73*   BNP (last 3 results)  Recent Labs  10/27/13 1606 10/30/13 0531 12/21/13 2041  PROBNP 16497.0* 12300.0* 15092.0*   CBG:  Recent Labs Lab 12/31/13 1308 12/31/13 1647 12/31/13 2224 01/01/14 0808 01/01/14 1217  GLUCAP 221* 95 138* 105* 146*    Recent Results (from the past 240 hour(s))  Clostridium Difficile by PCR     Status: None   Collection Time: 12/23/13  1:59 AM  Result Value Ref Range Status   C difficile by pcr NEGATIVE NEGATIVE Final     Studies:  Recent x-ray  studies have been reviewed in detail by the Attending Physician  Time spent :  [place], ANP [facility] Office  [phone] Pager [phone]  On-Call/Text Page:      [email or web address]      password [email or web address]  If 7PM-7AM, please contact night-coverage [email or web address] Password [email or web address] 01/01/2014, 1:28 PM   LOS: 11 days   I have personally examined this patient and reviewed the entire database. I have reviewed the above note, made any  necessary editorial changes, and agree with its content.  Cherene Altes, MD [facility]

## 2014-01-02 DIAGNOSIS — N189 Chronic kidney disease, unspecified: Secondary | ICD-10-CM

## 2014-01-02 DIAGNOSIS — N179 Acute kidney failure, unspecified: Secondary | ICD-10-CM | POA: Insufficient documentation

## 2014-01-02 LAB — CARBOXYHEMOGLOBIN
CARBOXYHEMOGLOBIN: 2.6 % — AB (ref 0.5–1.5)
Carboxyhemoglobin: 3.3 % — ABNORMAL HIGH (ref 0.5–1.5)
METHEMOGLOBIN: 0.5 % (ref 0.0–1.5)
Methemoglobin: 0.8 % (ref 0.0–1.5)
O2 Saturation: 100 %
O2 Saturation: 84 %
TOTAL HEMOGLOBIN: 8.3 g/dL — AB (ref 12.0–16.0)
Total hemoglobin: 9.6 g/dL — ABNORMAL LOW (ref 12.0–16.0)

## 2014-01-02 LAB — BASIC METABOLIC PANEL
Anion gap: 13 (ref 5–15)
BUN: 90 mg/dL — AB (ref 6–23)
CALCIUM: 8.3 mg/dL — AB (ref 8.4–10.5)
CO2: 31 mEq/L (ref 19–32)
Chloride: 93 mEq/L — ABNORMAL LOW (ref 96–112)
Creatinine, Ser: 2.99 mg/dL — ABNORMAL HIGH (ref 0.50–1.10)
GFR calc Af Amer: 18 mL/min — ABNORMAL LOW (ref >90)
GFR, EST NON AFRICAN AMERICAN: 15 mL/min — AB (ref >90)
Glucose, Bld: 64 mg/dL — ABNORMAL LOW (ref 70–99)
POTASSIUM: 3.5 meq/L — AB (ref 3.7–5.3)
Sodium: 137 mEq/L (ref 137–147)

## 2014-01-02 LAB — GLUCOSE, CAPILLARY
GLUCOSE-CAPILLARY: 233 mg/dL — AB (ref 70–99)
Glucose-Capillary: 126 mg/dL — ABNORMAL HIGH (ref 70–99)
Glucose-Capillary: 144 mg/dL — ABNORMAL HIGH (ref 70–99)
Glucose-Capillary: 214 mg/dL — ABNORMAL HIGH (ref 70–99)

## 2014-01-02 LAB — CBC
HEMATOCRIT: 25.4 % — AB (ref 36.0–46.0)
HEMOGLOBIN: 8.1 g/dL — AB (ref 12.0–15.0)
MCH: 30.7 pg (ref 26.0–34.0)
MCHC: 31.9 g/dL (ref 30.0–36.0)
MCV: 96.2 fL (ref 78.0–100.0)
Platelets: 72 10*3/uL — ABNORMAL LOW (ref 150–400)
RBC: 2.64 MIL/uL — ABNORMAL LOW (ref 3.87–5.11)
RDW: 19.8 % — AB (ref 11.5–15.5)
WBC: 6 10*3/uL (ref 4.0–10.5)

## 2014-01-02 NOTE — Progress Notes (Signed)
Patient ID: [name] [name], female   DOB: 1950-02-10, 63 y.o.   MRN: [id number] Advanced Heart Failure Rounding Note   Subjective:    63 yo with history of CAD and systolic HF due to ischemic cardiomyopathy with EF 30-35% andpulmonary venous hypertension, myelodysplastic syndrome, GI bleed, and CKD . Admitted with volume overload and acute on chronic renal failure.   Feels cold. Poor appetite. Lasix stopped 11/24  due to rising creatinine. Torsemide started 11/25. Creatinine worse again 3.0->2.8 -> 30. Denies dyspnea. I/O -1.8L Weights all over the place. Co-ox 100%     Objective:   Weight Range:  Vital Signs:   Temp:  [97.3 F (36.3 C)-99.4 F (37.4 C)] 97.3 F (36.3 C) (11/26 0821) Pulse Rate:  [70-105] 70 (11/26 0821) Resp:  [11-18] 16 (11/26 0821) BP: (88-123)/(45-62) 123/57 mmHg (11/26 0821) SpO2:  [97 %-100 %] 97 % (11/26 0821) Weight:  [51.12 kg (112 lb 11.2 oz)] 51.12 kg (112 lb 11.2 oz) (11/26 0338) Last BM Date: 12/31/13  Weight change: Filed Weights   12/31/13 0500 01/01/14 0500 01/02/14 0338  Weight: 54.5 kg (120 lb 2.4 oz) 55.2 kg (121 lb 11.1 oz) 51.12 kg (112 lb 11.2 oz)    Intake/Output:   Intake/Output Summary (Last 24 hours) at 01/02/14 0916 Last data filed at 01/02/14 0345  Gross per 24 hour  Intake   41.6 ml  Output    425 ml  Net -383.4 ml     Physical Exam: General:  Chronically ill appearing. No resp difficulty HEENT: normal diffuse exfoliative rash Neck: supple. JVP ear. Carotids 2+ bilat; no bruits. No lymphadenopathy or thryomegaly appreciated. Cor: PMI nondisplaced. Regular rate & rhythm. No rubs,  or murmurs. + S3  R chest power PICC Lungs: clear Abdomen: soft, nontender, +distended. No hepatosplenomegaly. No bruits or masses. Good bowel sounds. Extremities: no cyanosis, clubbing, rash trace tr leg edema  RUE infiltrate Neuro: alert & orientedx3, cranial nerves grossly intact. moves all 4 extremities w/o difficulty. Affect pleasant Skin: Dry  scales all over her body.   Telemetry: SR   Labs: Basic Metabolic Panel:  Recent Labs Lab 12/28/13 0500 12/29/13 0439 12/31/13 0500 01/01/14 0500 01/02/14 0454  NA 139 138 137 138 137  K 4.0 3.5* 4.0 3.7 3.5*  CL 97 98 94* 93* 93*  CO2 27 26 30  32 31  GLUCOSE 106* 146* 99 113* 64*  BUN 101* 95* 93* 89* 90*  CREATININE 2.79* 2.44* 3.00* 2.81* 2.99*  CALCIUM 8.1* 7.4* 8.1* 8.4 8.3*    Liver Function Tests:  Recent Labs Lab 12/29/13 0439  AST 21  ALT <5  ALKPHOS 101  BILITOT 0.9  PROT 5.6*  ALBUMIN 2.1*   No results for input(s): LIPASE, AMYLASE in the last 168 hours. No results for input(s): AMMONIA in the last 168 hours.  CBC:  Recent Labs Lab 12/28/13 0500 12/30/13 0900 12/31/13 0500 01/01/14 0500 01/02/14 0454  WBC 5.8 5.2 5.2 7.6 6.0  HGB 9.0* 8.4* 8.6* 8.5* 8.1*  HCT 27.1* 24.9* 26.5* 26.3* 25.4*  MCV 96.1 97.3 98.5 96.0 96.2  PLT 49* 55* 63* 73* 72*    Cardiac Enzymes: No results for input(s): CKTOTAL, CKMB, CKMBINDEX, TROPONINI in the last 168 hours.  BNP: BNP (last 3 results)  Recent Labs  10/27/13 1606 10/30/13 0531 12/21/13 2041  PROBNP 16497.0* 12300.0* 15092.0*     Other results:    Imaging: No results found.   Medications:     Scheduled Medications: . antiseptic oral rinse  7 mL Mouth Rinse BID  . aspirin EC  81 mg Oral Daily  . feeding supplement (GLUCERNA SHAKE)  237 mL Oral Daily  . hydrALAZINE  12.5 mg Oral 3 times per day  . hydrocerin   Topical BID  . insulin aspart  0-15 Units Subcutaneous TID WC  . insulin aspart  0-5 Units Subcutaneous QHS  . isosorbide mononitrate  30 mg Oral Daily  . lubriderm seriously sensitive   Topical BID  . mirtazapine  15 mg Oral QHS  . neomycin-bacitracin-polymyxin   Topical BID  . sodium chloride  10-40 mL Intracatheter Q12H  . torsemide  40 mg Oral BID    Infusions: . milrinone 0.125 mcg/kg/min (01/01/14 0800)    PRN Medications: acetaminophen **OR** acetaminophen,  camphor-menthol, hydrOXYzine, ondansetron **OR** ondansetron (ZOFRAN) IV, oxyCODONE, sodium chloride, zolpidem   Assessment:   1. A/C Systolic Heart Failure. Ischemic cardiomyopathy, EF 30-35% with cardiorenal syndrome 2. Acute on chronic renal failure Stage IV: Not a good HD candidate.  3. DM2 4. H/O Gout 5. H/O GI bleed 10/2013  6. Diffuse scaly rash 7. MDS with pancytopenia   Plan/Discussion:    Off IV lasix and on po demadex. CVP still up. Creatinine climbing. Will continue demadex at 40 bid. Repeat co-ox if 55 or greater will stop milrinone.   I think this is as good as we can get her from a HF perspective and unfortunately she remains quite ill. Would strongly consider [facility].    Magdalynn Davilla,MD 9:16 AM

## 2014-01-02 NOTE — Progress Notes (Signed)
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 On admission, patient was found to be in acute heart failure with ascites-similar to past admissions. She was also noted to have a creatinine of 3.45 and was hypothermic requiring Customer service manager. Patient placed in stepdown unit and started on IV Lasix. Initially there was concern about sepsis and antibiotics started, discontinued the following morning as no immediate signs of infection noted  11/16 she had not been responding very well to Lasix alone. Seen by nephrology and cardiology. Creatinine progressively worsening. Through son, patient declined central line needed for IV milrinone but Cardiology was able to have PICC placed instead since she is NOT a candidate for HD. She has continued to have issues with hypothermia requiring Retail banker.  11/17 outpt notes from oncology reviewed. Prognosis documented as poor and NOT candidate for systemic treatment of high-grade myelodysplastic syndrome. Dr. Alvy Bimler recommended the option of Hospice but also felt the family at that time was not ready. She also recommended supportive care and to transfuse if Hgb </= 8.0.   HPI/Subjective: 11/26 A/O 4, complains of pain cold.  Assessment/Plan: Acute on chronic combined systolic and diastolic congestive heart failure/ICM/ Ascites -Cont Demadex 40 mg BID (per Cards) -neg 15,000 cc since admission-weights have been inconsistent -PICC line in chest  -ascites from passive hepatic  congestion; Interventional radiology performed therapeutic paracentesis with 1.1 L bloody fluid removed -stable on Imdur and KCL -Per cardiology patient has reached a plateau for her heart failure care; will need to have palliative care address discharge options with family  Acute renal failure on CKD, stage IV with cardiorenal syndrome -Scr slowly trending down -NOT a candidate for HD-nephrology has signed off -Palliative has evaluated this admission   Essential hypertension -BP soft and due to recent episodes of hypotension hydralazine was dc'd -BP stable with diuresis   Hyperlipidemia -statin was dc'd in setting of elevated LFTs -repeat prior to discharge   Type 2 diabetes mellitus with peripheral neuropathy -CBGs were trending up as intake improved so increased to moderate SSI  -CBGs now better controlled -HgbA1c was 6.2 Sept 2015    Protein calorie malnutrition -Nutrition following-intake improving   Anemia 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) -MDS driving sx anemia -per onco OP notes rec is to transfuse for Hgb </= 8.0 -11/17 Hgb 7.0 so was transfused 2 units PRBCs each over 4 hours -current hgb stable  -severe MDS alone offers dire prognosis-Palliative eval in progress -Spoke with NP Elmo Putt (palliative care), will have Dr. Deitra Mayo again address palliative care/hospice with family  Exfoliative dermatitis -suspect less likely drug rash -Neosporin for excoriated wounds; lotions to intact skin -Added IV Ancef as prophylaxis to prevent diffuse cellulitis -no antibiotic disc over PICC site due to causes inflammation   DVT prophylaxis: SCDs Code Status: Full Family Communication: No family at bedside Disposition Plan/Expected LOS: SDU   Consultants: Cardiology/ Dr. Haroldine Laws Nephrology/Dr. Posey Pronto Palliative Care/Dr. Deitra Mayo   Consultants: Cardiology/ Dr. Haroldine Laws Nephrology/Dr. Posey Pronto Palliative Care/Dr. Deitra Mayo  Procedure/Significant  Events: 11/16 IRTherapeutic paracentesis    Culture 11/14 blood right arm/left hand negative 11/14 urine negative 11/15 MRSA  by PCR negative 11/16 MRSA by PCR negative  Antibiotics: Zosyn 11/14  vancomycin 11/14  Ancef 11/19 >  DVT prophylaxis: SCD   Devices    LINES / TUBES:      Continuous Infusions:   Objective: VITAL SIGNS: Temp: 97.3 F (36.3 C) (11/26 0821) Temp Source: Oral (11/26 0821) BP: 123/57 mmHg (11/26 0821) Pulse Rate: 70 (11/26 0821) SPO2; FIO2:   Intake/Output Summary (Last 24 hours) at 01/02/14 1054 Last data filed at 01/02/14 1000  Gross per 24 hour  Intake   28.8 ml  Output    250 ml  Net -221.2 ml     Exam: General: A/O 4, NAD, extremely weak, No acute respiratory distress Lungs: Clear to auscultation bilaterally without wheezes or crackles Cardiovascular: Regular rate and rhythm without murmur gallop or rub normal S1 and S2 Abdomen: Nontender, nondistended, soft, bowel sounds positive, no rebound, no ascites, no appreciable mass Extremities: No significant cyanosis, clubbing, or edema bilateral lower extremities  Data Reviewed: Basic Metabolic Panel:  Recent Labs Lab 12/28/13 0500 12/29/13 0439 12/31/13 0500 01/01/14 0500 01/02/14 0454  NA 139 138 137 138 137  K 4.0 3.5* 4.0 3.7 3.5*  CL 97 98 94* 93* 93*  CO2 27 26 30  32 31  GLUCOSE 106* 146* 99 113* 64*  BUN 101* 95* 93* 89* 90*  CREATININE 2.79* 2.44* 3.00* 2.81* 2.99*  CALCIUM 8.1* 7.4* 8.1* 8.4 8.3*   Liver Function Tests:  Recent Labs Lab 12/29/13 0439  AST 21  ALT <5  ALKPHOS 101  BILITOT 0.9  PROT 5.6*  ALBUMIN 2.1*   No results for input(s): LIPASE, AMYLASE in the last 168 hours. No results for input(s): AMMONIA in the last 168 hours. CBC:  Recent Labs Lab 12/28/13 0500 12/30/13 0900 12/31/13 0500 01/01/14 0500 01/02/14 0454  WBC 5.8 5.2 5.2 7.6 6.0  HGB 9.0* 8.4* 8.6* 8.5* 8.1*  HCT 27.1* 24.9* 26.5* 26.3* 25.4*  MCV 96.1 97.3 98.5  96.0 96.2  PLT 49* 55* 63* 73* 72*   Cardiac Enzymes: No results for input(s): CKTOTAL, CKMB, CKMBINDEX, TROPONINI in the last 168 hours. BNP (last 3 results)  Recent Labs  10/27/13 1606 10/30/13 0531 12/21/13 2041  PROBNP 16497.0* 12300.0* 15092.0*   CBG:  Recent Labs Lab 01/01/14 0808 01/01/14 1217 01/01/14 1652 01/01/14 2217 01/02/14 0829  GLUCAP 105* 146* 153* 201* 126*    No results found for this or any previous visit (from the past 240 hour(s)).   Studies:  Recent x-ray studies have been reviewed in detail by the Attending Physician  Scheduled Meds:  Scheduled Meds: . antiseptic oral rinse  7 mL Mouth Rinse BID  . aspirin EC  81 mg Oral Daily  . feeding supplement (GLUCERNA SHAKE)  237 mL Oral Daily  . hydrALAZINE  12.5 mg Oral 3 times per day  . hydrocerin   Topical BID  . insulin aspart  0-15 Units Subcutaneous TID WC  . insulin aspart  0-5 Units Subcutaneous QHS  . isosorbide mononitrate  30 mg Oral Daily  . lubriderm seriously sensitive   Topical BID  . mirtazapine  15 mg Oral QHS  . neomycin-bacitracin-polymyxin   Topical BID  . sodium chloride  10-40 mL Intracatheter Q12H  . torsemide  40 mg Oral BID    Time spent on care of this patient: 40 mins   Allie Bossier , MD   [facility] Office  [phone] Pager [phone]  On-Call/Text Page:  [email or web address]      password [email or web address]  If 7PM-7AM, please contact night-coverage [email or web address] Password [email or web address] 01/02/2014, 10:54 AM   LOS: 12 days

## 2014-01-03 LAB — BASIC METABOLIC PANEL
Anion gap: 15 (ref 5–15)
BUN: 89 mg/dL — AB (ref 6–23)
CO2: 30 mEq/L (ref 19–32)
Calcium: 8.3 mg/dL — ABNORMAL LOW (ref 8.4–10.5)
Chloride: 92 mEq/L — ABNORMAL LOW (ref 96–112)
Creatinine, Ser: 2.84 mg/dL — ABNORMAL HIGH (ref 0.50–1.10)
GFR calc Af Amer: 19 mL/min — ABNORMAL LOW (ref >90)
GFR, EST NON AFRICAN AMERICAN: 16 mL/min — AB (ref >90)
GLUCOSE: 92 mg/dL (ref 70–99)
POTASSIUM: 3.5 meq/L — AB (ref 3.7–5.3)
Sodium: 137 mEq/L (ref 137–147)

## 2014-01-03 LAB — CARBOXYHEMOGLOBIN
Carboxyhemoglobin: 1.7 % — ABNORMAL HIGH (ref 0.5–1.5)
METHEMOGLOBIN: 0.8 % (ref 0.0–1.5)
O2 Saturation: 62.8 %
Total hemoglobin: 18.3 g/dL — ABNORMAL HIGH (ref 12.0–16.0)

## 2014-01-03 LAB — CBC
HCT: 26.3 % — ABNORMAL LOW (ref 36.0–46.0)
HEMOGLOBIN: 8.4 g/dL — AB (ref 12.0–15.0)
MCH: 30.9 pg (ref 26.0–34.0)
MCHC: 31.9 g/dL (ref 30.0–36.0)
MCV: 96.7 fL (ref 78.0–100.0)
Platelets: 77 10*3/uL — ABNORMAL LOW (ref 150–400)
RBC: 2.72 MIL/uL — ABNORMAL LOW (ref 3.87–5.11)
RDW: 19.6 % — AB (ref 11.5–15.5)
WBC: 6.3 10*3/uL (ref 4.0–10.5)

## 2014-01-03 LAB — GLUCOSE, CAPILLARY
GLUCOSE-CAPILLARY: 129 mg/dL — AB (ref 70–99)
GLUCOSE-CAPILLARY: 209 mg/dL — AB (ref 70–99)
GLUCOSE-CAPILLARY: 70 mg/dL (ref 70–99)
Glucose-Capillary: 94 mg/dL (ref 70–99)

## 2014-01-03 MED ORDER — HYDROXYZINE HCL 25 MG PO TABS: 25.0000 mg | ORAL_TABLET | Freq: Four times a day (QID) | ORAL | Status: DC | PRN

## 2014-01-03 MED ORDER — MIRTAZAPINE 15 MG PO TABS: 15.0000 mg | ORAL_TABLET | Freq: Every day | ORAL | Status: DC

## 2014-01-03 MED ORDER — HYDRALAZINE HCL 25 MG PO TABS: 25.0000 mg | ORAL_TABLET | Freq: Three times a day (TID) | ORAL | Status: DC

## 2014-01-03 MED ORDER — ISOSORBIDE MONONITRATE ER 30 MG PO TB24: 30.0000 mg | ORAL_TABLET | Freq: Every day | ORAL | Status: DC

## 2014-01-03 NOTE — Discharge Instructions (Signed)

## 2014-01-03 NOTE — Progress Notes (Signed)
Patient [name] [name]      DOB: 04/20/1950      HKV:[id number]   Palliative Medicine Team at [facility] Progress Note    Subjective: Denies pain. Itching better. Appetite remains poor.      Filed Vitals:   01/03/14 1145  BP: 123/62  Pulse: 81  Temp: 99.8 F (37.7 C)  Resp: 17   Physical exam: Gen: alert, NAD  HEENT: , sclera anicteric  CV: RRR LUNGS: CTAB  ABD: soft, ND  Skin: improving exfoliative dermatitis.  CBC    Component Value Date/Time   WBC 6.3 01/03/2014 0500   WBC 4.8 12/11/2013 0940   WBC 5.2 03/05/2013 1916   RBC 2.72* 01/03/2014 0500   RBC 2.62* 12/11/2013 0940   RBC 3.95* 03/05/2013 1916   RBC 2.90* 08/22/2012 0907   HGB 8.4* 01/03/2014 0500   HGB 8.5* 12/11/2013 0940   HGB 11.3* 03/05/2013 1916   HCT 26.3* 01/03/2014 0500   HCT 26.2* 12/11/2013 0940   HCT 37.5* 03/05/2013 1916   PLT 77* 01/03/2014 0500   PLT 51* 12/11/2013 0940   MCV 96.7 01/03/2014 0500   MCV 99.9 12/11/2013 0940   MCV 95.0 03/05/2013 1916   MCH 30.9 01/03/2014 0500   MCH 32.4 12/11/2013 0940   MCH 28.6 03/05/2013 1916   MCHC 31.9 01/03/2014 0500   MCHC 32.4 12/11/2013 0940   MCHC 30.1* 03/05/2013 1916   RDW 19.6* 01/03/2014 0500   RDW 23.5* 12/11/2013 0940   LYMPHSABS 0.4* 12/21/2013 2041   LYMPHSABS 0.4* 12/11/2013 0940   MONOABS 0.4 12/21/2013 2041   MONOABS 0.3 12/11/2013 0940   EOSABS 1.0* 12/21/2013 2041   EOSABS 2.5* 12/11/2013 0940   BASOSABS 0.0 12/21/2013 2041   BASOSABS 0.0 12/11/2013 0940    CMP     Component Value Date/Time   NA 137 01/03/2014 0500   NA 129* 11/12/2013 1205   K 3.5* 01/03/2014 0500   K 4.2 11/12/2013 1205   CL 92* 01/03/2014 0500   CO2 30 01/03/2014 0500   CO2 25 11/12/2013 1205   GLUCOSE 92 01/03/2014 0500   GLUCOSE 169* 11/12/2013 1205   BUN 89* 01/03/2014 0500   BUN 95.4* 11/12/2013 1205   CREATININE 2.84* 01/03/2014 0500   CREATININE 2.2* 11/12/2013 1205   CREATININE 1.57* 01/27/2013 1315   CALCIUM 8.3* 01/03/2014  0500   CALCIUM 8.8 11/12/2013 1205   PROT 5.6* 12/29/2013 0439   PROT 7.7 11/12/2013 1205   ALBUMIN 2.1* 12/29/2013 0439   ALBUMIN 2.9* 11/12/2013 1205   AST 21 12/29/2013 0439   AST 24 11/12/2013 1205   ALT <5 12/29/2013 0439   ALT 22 11/12/2013 1205   ALKPHOS 101 12/29/2013 0439   ALKPHOS 174* 11/12/2013 1205   BILITOT 0.9 12/29/2013 0439   BILITOT 1.14 11/12/2013 1205   GFRNONAA 16* 01/03/2014 0500   GFRAA 19* 01/03/2014 0500       Assessment and plan: 63 yo [name] with PMHx of systolic HF, Pulm HTN, CKD, MDS who presented with volume overload. Multiple hospitalizations this year. Palliative care consulted for goals of care.   1. Code Status: Full (as per prior documentation)  2. Goals of Care: See previous documentation by myself and Dr Hilma Favors.   I do not think families goals have changed.  In speaking with son Mayer Camel, he feels like hospital stay has improved things for her.  He has acknowledged that she is high risk for further decline.  I did not bring up hospice again,  because I think that would only create distrust with palliative medicine.  I do not think they would embrace hospice philosphy and ultimately is their choice.  Son hopes for her to continue biweekly labs with transfusion through Dr Alvy Bimler.  Case manager to assist with dispo planning.    3. Symptom Management:  1. Loss of Appetite- would continue remeron.  2. Pruritis- PRN hydroxyzine, remeron  4. Psychosocial/Spiritual: [name], lives at home with 2 sons and daughter.   Doran Clay D.O. Palliative Medicine Team at [facility]  Pager: [phone] Team Phone: [phone]

## 2014-01-03 NOTE — Care Management Note (Addendum)
    Page 1 of 2   01/09/2014     12:12:05 PM CARE MANAGEMENT NOTE 01/09/2014  Patient:  [name] [name], [name] [name]   Account Number:  [id number]  Date Initiated:  12/25/2013  Documentation initiated by:  MAYO,HENRIETTA  Subjective/Objective Assessment:   dx systolic & diastolic failure/ICM; lives with son    PCP  Angelica Chessman @ [facility]     Action/Plan:   CM to follow for disposition needs   Anticipated DC Date:  01/03/2014   Anticipated DC Plan:  [place]  In-house referral  [place]  CM consult  Medication Assistance      [facility] Choice  [place]   Choice offered to / List presented to:  C-4 Adult Children   DME arranged  Vassie Moselle      DME agency  [place] arranged  HH-1 RN  [place].   Status of service:  Completed, signed off Medicare Important Message given?  NO (If response is "NO", the following Medicare IM given date fields will be blank) Date Medicare IM given:   Medicare IM given by:   Date Additional Medicare IM given:   Additional Medicare IM given by:    Discharge Disposition:  [place]  Per UR Regulation:  Reviewed for med. necessity/level of care/duration of stay  If discussed at [facility] Point of Stay Meetings, dates discussed:   12/24/2013  12/26/2013  12/31/2013    Comments:  Donella Stade Daunte Oestreich RN, BSN, MSHL, CCM  Nurse - Case Manager,  ([phone]  01/09/2014 Patient received to [facility] CM services 01/08/2014 PICC placement scheduled for today for home IV ABXs ([facility] notified) DME RECS:  Rolling walker with 5" wheels PT RECS:  none Dispo Plan:  Home with HHS: [place] [facility]F Clinic f/u.    ContactCharlotte Sanes, #[phone]  01/03/14 [place] MSN BSN CCM Pt to d/c home with home health, provided list of agencies to son who requests referral to  [facility] - liaison notified.  Pt will have 24/7 assistance @ home and will f/u with [facility]. Pt has cane @ home but needs rolling walker per PT and son's request.  Son requests assistance with advanced directives.  Chaplain called - will assist with same before discharge.  12/31/13 [place] MSN BSN CCM Talked with dtr-in-law and pt @ bedside.  Discussed home vs SNF when medically stable, inability to get therapy with [facility] and current diagnoses.  Also provided information re applying for [facility] PCS.

## 2014-01-03 NOTE — Progress Notes (Signed)
Patient's son visiting and updated on patient status. He has requested that an advanced directive be set up before the patient leaves the hospital, "just in case of a worse case scenario" after she is discharged home.  Spoke to care manager about this request.  She is contacting the chaplain's office to see if this can be done today.  Patient is stable at this time.  Will continue to monitor.

## 2014-01-03 NOTE — Plan of Care (Signed)
Problem: Acute Rehab PT Goals(only PT should resolve) Goal: Pt Will Go Supine/Side To Sit Outcome: Completed/Met Date Met:  01/03/14

## 2014-01-03 NOTE — Discharge Summary (Signed)
DISCHARGE SUMMARY  [name] [name]  MR#: [id number]  DOB:1951/01/13  Date of Admission: 12/21/2013 Date of Discharge: 01/03/2014  Attending Physician:Maicie Vanderloop T  Patient's IOE:VOJJKK, [name] Dare, MD  Consults: Cardiology / Dr. Haroldine Laws Nephrology / Dr. Posey Pronto Palliative Care / Dr. Deitra Mayo  Disposition: D/C home   Follow-up Appts:     Follow-up Information    Follow up with [place] On 01/09/2014.   Specialty:  Cardiology   Why:  @ 9:45 am; [place]; Please bring all your medications to your visit or you may not be seen.    Contact information:   [address] [id number] [employer] [phone]      Follow up with Angelica Chessman, MD. Schedule an appointment as soon as possible for a visit in 5 days.   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/ Ascites Acute renal failure on CKD, stage IV with cardiorenal syndrome Essential hypertension Hyperlipidemia Type 2 diabetes mellitus with peripheral neuropathy Protein calorie malnutrition 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 On  admission, patient was found to be in acute heart failure with ascites-similar to past admissions. She was also noted to have a creatinine of 3.45 and was hypothermic requiring Customer service manager. Patient placed in stepdown unit and started on IV Lasix.  Hospital Course: Initially there was concern about sepsis and empiric antibiotics were started.  Abx were discontinued the following morning as there were no clinical signs suggestive of infection.    11/16 she had not been responding very well to Lasix alone. Seen by nephrology and cardiology. Creatinine progressively worsening. Through son, patient declined central line needed for IV milrinone but Cardiology was able to have PICC placed instead since she is NOT a candidate for HD. She has continued to have issues with hypothermia requiring Retail banker.  11/17 outpt notes from oncology reviewed. Prognosis documented as poor and NOT candidate for systemic treatment of high-grade myelodysplastic syndrome. Dr. Alvy Bimler recommended the option of Hospice but also felt the family at that time was not ready. She also recommended supportive care and to transfuse if Hgb </= 8.0.  Patient has had excellent diuretic response after milrinone initiated in combination with Lasix and metolazone. By 11/24 her creatinine began to rise so the Lasix and metolazone were discontinued. Discontinuation of milrinone accomplished on 11/26.   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/ Ascites -Medication adjustments per cardiology: Lasix and metolazone discontinued 11/25; milrinone continued through 11/26; transitioned to oral Demadex 11/26 -net neg 23,000+ cc since admission-weights have been inconsistent -ascites from passive hepatic congestion; Interventional radiology performed therapeutic paracentesis with 1.1 L bloody fluid removed  Acute renal failure  on CKD,  stage IV with cardiorenal syndrome -Nephrology has seen - they signed off 11/18 -Scr up to 3.0 with a BUN of 93-trend back down to 89/2.81 with discontinuation of Lasix and metolazone on 11/24 -NOT a candidate for HD -Palliative has evaluated this admission  Essential hypertension -Due to hypotension Hydralazine had been held -Cardiology resumed 11/25  Hyperlipidemia -statin was dc'd in setting of elevated LFTs - recheck LFTs in ~1 week and consider resuming statin   Type 2 diabetes mellitus with peripheral neuropathy -CBGs had trended up as intake improved so increased to moderate SSI 11/23 -CBGs better controlled at time of d/c - to resume usual home DM regimen -HgbA1c was 6.2 Sept 2015   Protein calorie malnutrition -Nutrition evaluated-intake improving  Anemia 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) -MDS driving sx anemia -per Onco OP notes rec is to transfuse for Hgb </= 8.0 -11/17 Hgb 7.0 so was transfused 2 units PRBCs each over 4 hours -current hgb stable and greater than 8.0 -severe MDS alone offers dire prognosis-Palliative Care has seen, but the family is not interested in hospice care, and desires to continue aggressive medical tx   Exfoliative dermatitis -Not consistent with drug rash -Utilized Neosporin for excoriated wounds; lotions to intact skin with improvement in dermatitis symptoms -Added IV Ancef as prophylaxis to prevent diffuse cellulitis for 7 day course       Medication List    STOP taking these medications        atorvastatin 10 MG tablet  Commonly known as:  LIPITOR     carvedilol 3.125 MG tablet  Commonly known as:  COREG      TAKE these medications        aspirin 81 MG EC tablet  Take 1 tablet (81 mg total) by mouth daily.     hydrALAZINE 25 MG tablet  Commonly known as:  APRESOLINE  Take 1 tablet (25 mg total) by mouth 3 (three) times daily.     hydrOXYzine 25 MG tablet  Commonly known as:  ATARAX/VISTARIL  Take 1 tablet  (25 mg total) by mouth every 6 (six) hours as needed for itching.     isosorbide mononitrate 30 MG 24 hr tablet  Commonly known as:  IMDUR  Take 1 tablet (30 mg total) by mouth daily.     mirtazapine 15 MG tablet  Commonly known as:  REMERON  Take 1 tablet (15 mg total) by mouth at bedtime.     torsemide 20 MG tablet  Commonly known as:  DEMADEX  Take 2 tablets (40 mg total) by mouth 2 (two) times daily.       Day of Discharge BP 109/55 mmHg  Pulse 81  Temp(Src) 99.8 F (37.7 C) (Oral)  Resp 17  Ht 5\' 4"  (1.626 m)  Wt 49.8 kg (109 lb 12.6 oz)  BMI 18.84 kg/m2  SpO2 98%  Physical Exam: General: No acute respiratory distress Lungs: Clear to auscultation bilaterally without wheezes or crackles Cardiovascular: Regular rate and rhythm without murmur gallop or rub normal S1 and S2 Abdomen: Nontender, nondistended, soft, bowel sounds positive, no rebound, no ascites, no appreciable mass Extremities: No significant cyanosis, clubbing, or edema bilateral lower extremities  Basic Metabolic Panel:  Recent Labs Lab 12/29/13 0439 12/31/13 0500 01/01/14 0500 01/02/14 0454 01/03/14 0500  NA 138 137 138 137 137  K 3.5* 4.0 3.7 3.5* 3.5*  CL 98 94* 93* 93* 92*  CO2 26 30 32 31 30  GLUCOSE 146*  99 113* 64* 92  BUN 95* 93* 89* 90* 89*  CREATININE 2.44* 3.00* 2.81* 2.99* 2.84*  CALCIUM 7.4* 8.1* 8.4 8.3* 8.3*    Liver Function Tests:  Recent Labs Lab 12/29/13 0439  AST 21  ALT <5  ALKPHOS 101  BILITOT 0.9  PROT 5.6*  ALBUMIN 2.1*   CBC:  Recent Labs Lab 12/30/13 0900 12/31/13 0500 01/01/14 0500 01/02/14 0454 01/03/14 0500  WBC 5.2 5.2 7.6 6.0 6.3  HGB 8.4* 8.6* 8.5* 8.1* 8.4*  HCT 24.9* 26.5* 26.3* 25.4* 26.3*  MCV 97.3 98.5 96.0 96.2 96.7  PLT 55* 63* 73* 72* 77*   BNP (last 3 results)  Recent Labs  10/27/13 1606 10/30/13 0531 12/21/13 2041  PROBNP 16497.0* 12300.0* 15092.0*   CBG:  Recent Labs Lab 01/02/14 1333 01/02/14 1557 01/02/14 2143  01/03/14 0734 01/03/14 1144  GLUCAP 144* 233* 214* 129* 94    Time spent in discharge (includes decision making & examination of pt): >30 minutes  01/03/2014, 3:53 PM   Cherene Altes, MD [facility] Office  [phone] Pager [phone]  On-Call/Text Page:      [email or web address]      password [facility]

## 2014-01-03 NOTE — Progress Notes (Addendum)
Patient ID: [name] [name], female   DOB: [birth year], 63 y.o.   MRN: [id number] Advanced Heart Failure Rounding Note   Subjective:    63 yo with history of CAD and systolic HF due to ischemic cardiomyopathy with EF 30-35% andpulmonary venous hypertension, myelodysplastic syndrome, GI bleed, and CKD . Admitted with volume overload and acute on chronic renal failure.   Denies SOB. Milrinone stopped yesterday. Remains on po torsemide. Creatinine improved slightly. Weight down though CVP remains high ~16..     Objective:   Weight Range:  Vital Signs:   Temp:  [98 F (36.7 C)-98.9 F (37.2 C)] 98.9 F (37.2 C) (11/27 0736) Pulse Rate:  [76-91] 91 (11/27 0736) Resp:  [12-22] 13 (11/27 0736) BP: (117-130)/(55-85) 124/85 mmHg (11/27 0736) SpO2:  [96 %-100 %] 96 % (11/27 0736) Weight:  [49.8 kg (109 lb 12.6 oz)] 49.8 kg (109 lb 12.6 oz) (11/27 0500) Last BM Date: 01/02/14  Weight change: Filed Weights   01/01/14 0500 01/02/14 0338 01/03/14 0500  Weight: 55.2 kg (121 lb 11.1 oz) 51.12 kg (112 lb 11.2 oz) 49.8 kg (109 lb 12.6 oz)    Intake/Output:   Intake/Output Summary (Last 24 hours) at 01/03/14 0858 Last data filed at 01/03/14 0716  Gross per 24 hour  Intake  136.8 ml  Output   1300 ml  Net -1163.2 ml     Physical Exam: General:  Chronically ill appearing. No resp difficulty HEENT: normal diffuse exfoliative rash Neck: supple. JVP ear. Carotids 2+ bilat; no bruits. No lymphadenopathy or thryomegaly appreciated. Cor: PMI nondisplaced. Regular rate & rhythm. No rubs,  or murmurs. + S3  R chest power PICC Lungs: clear Abdomen: soft, nontender, +distended. No hepatosplenomegaly. No bruits or masses. Good bowel sounds. Extremities: no cyanosis, clubbing, rash trace tr leg edema  RUE infiltrate Neuro: alert & orientedx3, cranial nerves grossly intact. moves all 4 extremities w/o difficulty. Affect pleasant Skin: Dry scales all over her body.   Telemetry: SR   Labs: Basic  Metabolic Panel:  Recent Labs Lab 12/29/13 0439 12/31/13 0500 01/01/14 0500 01/02/14 0454 01/03/14 0500  NA 138 137 138 137 137  K 3.5* 4.0 3.7 3.5* 3.5*  CL 98 94* 93* 93* 92*  CO2 26 30 32 31 30  GLUCOSE 146* 99 113* 64* 92  BUN 95* 93* 89* 90* 89*  CREATININE 2.44* 3.00* 2.81* 2.99* 2.84*  CALCIUM 7.4* 8.1* 8.4 8.3* 8.3*    Liver Function Tests:  Recent Labs Lab 12/29/13 0439  AST 21  ALT <5  ALKPHOS 101  BILITOT 0.9  PROT 5.6*  ALBUMIN 2.1*   No results for input(s): LIPASE, AMYLASE in the last 168 hours. No results for input(s): AMMONIA in the last 168 hours.  CBC:  Recent Labs Lab 12/30/13 0900 12/31/13 0500 01/01/14 0500 01/02/14 0454 01/03/14 0500  WBC 5.2 5.2 7.6 6.0 6.3  HGB 8.4* 8.6* 8.5* 8.1* 8.4*  HCT 24.9* 26.5* 26.3* 25.4* 26.3*  MCV 97.3 98.5 96.0 96.2 96.7  PLT 55* 63* 73* 72* 77*    Cardiac Enzymes: No results for input(s): CKTOTAL, CKMB, CKMBINDEX, TROPONINI in the last 168 hours.  BNP: BNP (last 3 results)  Recent Labs  10/27/13 1606 10/30/13 0531 12/21/13 2041  PROBNP 16497.0* 12300.0* 15092.0*     Other results:    Imaging: No results found.   Medications:     Scheduled Medications: . antiseptic oral rinse  7 mL Mouth Rinse BID  . aspirin EC  81 mg Oral Daily  .  feeding supplement (GLUCERNA SHAKE)  237 mL Oral Daily  . hydrALAZINE  12.5 mg Oral 3 times per day  . hydrocerin   Topical BID  . insulin aspart  0-15 Units Subcutaneous TID WC  . insulin aspart  0-5 Units Subcutaneous QHS  . isosorbide mononitrate  30 mg Oral Daily  . lubriderm seriously sensitive   Topical BID  . mirtazapine  15 mg Oral QHS  . neomycin-bacitracin-polymyxin   Topical BID  . sodium chloride  10-40 mL Intracatheter Q12H  . torsemide  40 mg Oral BID    Infusions:    PRN Medications: acetaminophen **OR** acetaminophen, camphor-menthol, hydrOXYzine, ondansetron **OR** ondansetron (ZOFRAN) IV, oxyCODONE, sodium chloride,  zolpidem   Assessment:   1. A/C Systolic Heart Failure. Ischemic cardiomyopathy, EF 30-35% with cardiorenal syndrome 2. Acute on chronic renal failure Stage IV: Not a good HD candidate.  3. DM2 4. H/O Gout 5. H/O GI bleed 10/2013  6. Diffuse scaly rash 7. MDS with pancytopenia   Plan/Discussion:    Weight and renal function improved on po demadex. Will increase hydralazine to 25 tid. Continue Imdur 30.   I think this is as good as we can get her from a HF perspective and unfortunately she remains quite ill.   Can d/c home from HF standpoint on HF meds below. Would strongly consider home Hospice.   Torsemide 40 bid  Hydralazine 25 tid (this is down from 100 tid) Imdur 30 daily   STOP home carvedilol and atorvastatin  F/u in HF clinic next week.   Daniel Bensimhon,MD 8:58 AM

## 2014-01-03 NOTE — Progress Notes (Signed)
Physical Therapy Treatment Patient Details Name: [name] [name] MRN: [id number] DOB: 04/12/1950 Today's Date: 01/03/2014    History of Present Illness 63 yo with history of CAD and systolic HF and pulmonary venous hypertension, myelodysplastic syndrome, GI bleed, and CKD . Admitted with volume overload and acute on chronic renal failure.     PT Comments    Patient progressing slowly towards PT goals. Willing to participate in transfer training and therapeutic exercises but reports she does not feel well enough to ambulate today. Tolerated exercises well. Vitals stable throughout therapy session. Patient will continue to benefit from skilled physical therapy services to further improve independence with functional mobility.   Follow Up Recommendations  No PT follow up;Supervision for mobility/OOB     Equipment Recommendations  Rolling walker with 5" wheels (If she does not already have a RW)    Recommendations for Other Services       Precautions / Restrictions Precautions Precautions: Fall Precaution Comments: fecal incontinence Restrictions Weight Bearing Restrictions: No    Mobility  Bed Mobility Overal bed mobility: Needs Assistance Bed Mobility: Supine to Sit Rolling: Supervision   Supine to sit: HOB elevated;Supervision     General bed mobility comments: Supervision for safety. HOB elevated and did not require physical assist.  Transfers Overall transfer level: Needs assistance Equipment used: 2 person hand held assist Transfers: Sit to/from Stand Sit to Stand: Min assist         General transfer comment: from bed, 2 person assist for balance, leans posteriorly. Cues for forward weight shift.  Ambulation/Gait Ambulation/Gait assistance: Min assist Ambulation Distance (Feet): 4 Feet Assistive device: 2 person hand held assist Gait Pattern/deviations: Step-through pattern;Decreased stride length;Decreased step length - right;Decreased step length -  left;Shuffle;Leaning posteriorly;Narrow base of support Gait velocity: decreased   General Gait Details: Pt unwilling to ambulate further distance today. Wanted to ambulate to chair with +2 hand held assist for stability. Leans posteriorly, cues for forward weight shift. Tries to sit prematurely but able to correct with facilitory cues to stand upright.   Stairs            Wheelchair Mobility    Modified Rankin (Stroke Patients Only)       Balance                                    Cognition Arousal/Alertness: Awake/alert Behavior During Therapy: WFL for tasks assessed/performed Overall Cognitive Status: Difficult to assess                      Exercises General Exercises - Lower Extremity Ankle Circles/Pumps: AAROM;Both;20 reps;Seated Quad Sets: Strengthening;Both;5 reps;AAROM;Seated (Difficulty understanding this exercise) Long Arc Quad: Strengthening;Both;10 reps;Seated Heel Slides: AAROM;Both;10 reps;Seated Hip ABduction/ADduction: AAROM;Both;10 reps;Seated Straight Leg Raises: Strengthening;Both;10 reps;AAROM;Seated    General Comments General comments (skin integrity, edema, etc.): Pt reports she does not feel well enough to ambulate further distance than to chair today but willing to participate in therapeutic exercises.      Pertinent Vitals/Pain Pain Assessment: No/denies pain Pain Intervention(s): Monitored during session  Start: BP 125/65 : HR 84 : SpO2 : 98% room air Finish: BP 124/75 : HR 85 : SpO2 : 96% room air    Home Living                      Prior Function  PT Goals (current goals can now be found in the care plan section) Acute Rehab PT Goals PT Goal Formulation: With patient Time For Goal Achievement: 01/13/14 Potential to Achieve Goals: Good Progress towards PT goals: Progressing toward goals    Frequency  Min 3X/week    PT Plan Current plan remains appropriate    Co-evaluation              End of Session Equipment Utilized During Treatment: Gait belt Activity Tolerance: Patient limited by lethargy;Other (comment) (Reports does not feel well enough to ambulate) Patient left: in chair;with call bell/phone within reach     Time: 0915-0931 PT Time Calculation (min) (ACUTE ONLY): 16 min  Charges:  $Therapeutic Exercise: 8-22 mins                    G Codes:      [name] [name] 01/26/14, 10:31 AM  [name] [name], [name] [name]

## 2014-01-04 ENCOUNTER — Inpatient Hospital Stay (HOSPITAL_COMMUNITY): Payer: Medicaid Other

## 2014-01-04 LAB — URINALYSIS, ROUTINE W REFLEX MICROSCOPIC
Bilirubin Urine: NEGATIVE
Glucose, UA: NEGATIVE mg/dL
Ketones, ur: NEGATIVE mg/dL
Nitrite: NEGATIVE
PH: 7 (ref 5.0–8.0)
Protein, ur: 100 mg/dL — AB
Specific Gravity, Urine: 1.011 (ref 1.005–1.030)
UROBILINOGEN UA: 0.2 mg/dL (ref 0.0–1.0)

## 2014-01-04 LAB — URINE MICROSCOPIC-ADD ON

## 2014-01-04 LAB — BASIC METABOLIC PANEL
Anion gap: 16 — ABNORMAL HIGH (ref 5–15)
BUN: 88 mg/dL — ABNORMAL HIGH (ref 6–23)
CALCIUM: 8.4 mg/dL (ref 8.4–10.5)
CO2: 29 meq/L (ref 19–32)
Chloride: 91 mEq/L — ABNORMAL LOW (ref 96–112)
Creatinine, Ser: 3.04 mg/dL — ABNORMAL HIGH (ref 0.50–1.10)
GFR calc Af Amer: 18 mL/min — ABNORMAL LOW (ref >90)
GFR calc non Af Amer: 15 mL/min — ABNORMAL LOW (ref >90)
GLUCOSE: 84 mg/dL (ref 70–99)
POTASSIUM: 3.2 meq/L — AB (ref 3.7–5.3)
Sodium: 136 mEq/L — ABNORMAL LOW (ref 137–147)

## 2014-01-04 LAB — CARBOXYHEMOGLOBIN
Carboxyhemoglobin: 2.1 % — ABNORMAL HIGH (ref 0.5–1.5)
Methemoglobin: 0.9 % (ref 0.0–1.5)
O2 Saturation: 61.3 %
Total hemoglobin: 10.6 g/dL — ABNORMAL LOW (ref 12.0–16.0)

## 2014-01-04 LAB — GLUCOSE, CAPILLARY
GLUCOSE-CAPILLARY: 228 mg/dL — AB (ref 70–99)
GLUCOSE-CAPILLARY: 148 mg/dL — AB (ref 70–99)
Glucose-Capillary: 89 mg/dL (ref 70–99)
Glucose-Capillary: 312 mg/dL — ABNORMAL HIGH (ref 70–99)

## 2014-01-04 LAB — CBC
HCT: 28 % — ABNORMAL LOW (ref 36.0–46.0)
HEMOGLOBIN: 9.2 g/dL — AB (ref 12.0–15.0)
MCH: 32.6 pg (ref 26.0–34.0)
MCHC: 32.9 g/dL (ref 30.0–36.0)
MCV: 99.3 fL (ref 78.0–100.0)
Platelets: 82 10*3/uL — ABNORMAL LOW (ref 150–400)
RBC: 2.82 MIL/uL — ABNORMAL LOW (ref 3.87–5.11)
RDW: 19.6 % — ABNORMAL HIGH (ref 11.5–15.5)
WBC: 6.1 10*3/uL (ref 4.0–10.5)

## 2014-01-04 MED ORDER — TORSEMIDE 20 MG PO TABS
40.0000 mg | ORAL_TABLET | Freq: Two times a day (BID) | ORAL | Status: DC
  Administered 2014-01-05: 40 mg via ORAL
  Filled 2014-01-04 (×3): qty 2

## 2014-01-04 MED ORDER — IBUPROFEN 200 MG PO TABS
200.0000 mg | ORAL_TABLET | Freq: Three times a day (TID) | ORAL | Status: DC | PRN
  Filled 2014-01-04: qty 1

## 2014-01-04 MED ORDER — IBUPROFEN 400 MG PO TABS
400.0000 mg | ORAL_TABLET | Freq: Four times a day (QID) | ORAL | Status: DC | PRN
  Administered 2014-01-04: 400 mg via ORAL
  Filled 2014-01-04 (×3): qty 1

## 2014-01-04 MED ORDER — CEFTRIAXONE SODIUM IN DEXTROSE 20 MG/ML IV SOLN
1.0000 g | INTRAVENOUS | Status: DC
  Administered 2014-01-05 – 2014-01-06 (×2): 1 g via INTRAVENOUS
  Filled 2014-01-04 (×3): qty 50

## 2014-01-04 MED ORDER — IBUPROFEN 200 MG PO TABS
200.0000 mg | ORAL_TABLET | Freq: Four times a day (QID) | ORAL | Status: DC | PRN
  Filled 2014-01-04: qty 1

## 2014-01-04 MED ORDER — POTASSIUM CHLORIDE CRYS ER 20 MEQ PO TBCR
30.0000 meq | EXTENDED_RELEASE_TABLET | Freq: Once | ORAL | Status: AC
  Administered 2014-01-04: 30 meq via ORAL
  Filled 2014-01-04: qty 1

## 2014-01-04 NOTE — Plan of Care (Signed)
Problem: Phase I Progression Outcomes Goal: Initial discharge plan identified Outcome: Progressing Goal: Voiding-avoid urinary catheter unless indicated Outcome: Completed/Met Date Met:  01/04/14  Problem: Phase II Progression Outcomes Goal: Walk in hall or up in chair TID Outcome: Progressing Goal: Discharge plan established Outcome: Progressing Goal: Fluid volume status improved Outcome: Completed/Met Date Met:  01/04/14 Goal: Case manager referral Outcome: Completed/Met Date Met:  01/04/14

## 2014-01-04 NOTE — Progress Notes (Signed)
Paged Dr. Thereasa Solo and notified him that pt has chills and states she is cold. She feels clammy. Pt's temperature is 101.6 axillary. Gave pt tylenol. Will monitor. Pt's blood pressure and other vitals within normal limits;see vital history. Dr. Thereasa Solo stated to cancel discharge at this time. Interventional radiology here and took PICC out of right chest. Pt resting.

## 2014-01-04 NOTE — Progress Notes (Signed)
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 On admission, patient was found to be in acute heart failure with ascites-similar to past admissions. She was also noted to have a creatinine of 3.45 and was hypothermic requiring Customer service manager. Patient placed in stepdown unit and started on IV Lasix. Initially there was concern about sepsis and antibiotics started, discontinued the following morning as no immediate signs of infection noted  11/16 she had not been responding very well to Lasix alone. Seen by nephrology and cardiology. Creatinine progressively worsening. Through son, patient declined central line needed for IV milrinone but Cardiology was able to have PICC placed instead since she is NOT a candidate for HD. She has continued to have issues with hypothermia requiring Retail banker.  11/17 outpt notes from oncology reviewed. Prognosis documented as poor and NOT candidate for systemic treatment of high-grade myelodysplastic syndrome. Dr. Alvy Bimler recommended the option of Hospice but also felt the family at that time was not ready. She also recommended supportive care and to transfuse if Hgb </= 8.0.  Patient has had excellent diuretic response after milrinone initiated in combination with Lasix and metolazone. By 11/24 her creatinine began to rise so the Lasix and metolazone were discontinued. Discontinuation of milrinone accomplished on 11/26.   D/C was planned for 11/27, but had to be delayed as pt had tunneled L chest PICC and IR not available to  remove it.  D/C was rescheduled for 11/28, but pt then developed a fever after removal of her PICC>    HPI/Subjective: Pt sitting up in bed eating food from home.  Denies any new complaints.  Does not look uncomfortable.  Denies cp, sob, n/v, or abdom pain.    Assessment/Plan:  FUO Coincident with removal of PICC line, raising question of cath related transient bacteremia - tx symptomatically - blood cx and UA sent - follow w/o abx for now  Acute on chronic combined systolic and diastolic congestive heart failure / ICM / Ascites -Medication adjustments per Cardiology: Lasix and metolazone discontinued 11/25; milrinone continued through 11/26; transitioned to oral Demadex 11/26 -net neg 24,000+ cc since admission -ascites from passive hepatic congestion; Interventional radiology performed therapeutic paracentesis with 1.1 L bloody fluid removed  Acute renal failure on CKD, stage IV with cardiorenal syndrome -Nephrology has seen - they signed off 11/18 -Scr up to 3.0 with a BUN of 93-trend back down to 89/2.81 with discontinuation of Lasix and metolazone on 11/24 -NOT a candidate for HD -Palliative has evaluated this admission  Essential hypertension -Due to hypotension Hydralazine had been held -Cardiology resumed 11/25 - BP stable presently   Hyperlipidemia -statin was dc'd in setting of elevated LFTs - recheck LFTs in ~1 week and consider resuming statin   Type 2 diabetes mellitus with peripheral neuropathy -cont to follow CBGs w/o change today  -HgbA1c was 6.2 Sept 2015   Protein calorie malnutrition -Nutrition evaluated-intake improving  Anemia 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) -MDS driving sx anemia -per Onco OP notes rec is to transfuse for Hgb </= 8.0 -11/17 Hgb 7.0 so was transfused 2 units PRBCs each over 4 hours -current hgb stable  and greater than 8.0 -severe MDS alone offers dire prognosis-Palliative Care has seen, but the family is not interested in  hospice care, and desires to continue aggressive medical tx   Exfoliative dermatitis -Not consistent with drug rash -Utilized Neosporin for excoriated wounds; lotions to intact skin with improvement in dermatitis symptoms -Added IV Ancef as prophylaxis to prevent diffuse cellulitis - completed 7 day course    DVT prophylaxis: SCDs Code Status: Full Family Communication: spoke w/ son and husband at bedside  Disposition Plan/Expected LOS: d/c home as soon as fever explained or resolves   Consultants: Cardiology/ Dr. Haroldine Laws Nephrology/Dr. Posey Pronto Palliative Care/Dr. Deitra Mayo  Procedures: Therapeutic paracentesis 11/16 IR R chest tunneled PICC 11/16 > 11/28  Antibiotics: Zosyn 11/14  Vancomycin 11/14  Ancef 11/19 > 11/25  Objective: Blood pressure 101/53, pulse 75, temperature 100.7 F (38.2 C), temperature source Axillary, resp. rate 21, height 5\' 4"  (1.626 m), weight 54.3 kg (119 lb 11.4 oz), SpO2 97 %.  Intake/Output Summary (Last 24 hours) at 01/04/14 1443 Last data filed at 01/04/14 1002  Gross per 24 hour  Intake     10 ml  Output    950 ml  Net   -940 ml   Exam: Gen: No acute respiratory distress - smiling, pleasant  Chest: Clear to auscultation bilaterally w/o wheeze or crackles  Cardiac: Regular rate and rhythm Abdomen: Soft nontender nondistended without obvious hepatosplenomegaly, no ascites Extremities: no signif C/C/E B LE  Skin: Improving exfoliating dermatitis without any active bullae or drainage  Scheduled Meds:  Scheduled Meds: . antiseptic oral rinse  7 mL Mouth Rinse BID  . aspirin EC  81 mg Oral Daily  . feeding supplement (GLUCERNA SHAKE)  237 mL Oral Daily  . hydrALAZINE  12.5 mg Oral 3 times per day  . hydrocerin   Topical BID  . insulin aspart  0-15 Units Subcutaneous TID WC  . insulin aspart  0-5 Units Subcutaneous QHS  . isosorbide mononitrate  30 mg Oral Daily  . lubriderm seriously sensitive   Topical BID  . mirtazapine  15 mg Oral QHS   . neomycin-bacitracin-polymyxin   Topical BID  . sodium chloride  10-40 mL Intracatheter Q12H  . torsemide  40 mg Oral BID    Data Reviewed: Basic Metabolic Panel:  Recent Labs Lab 12/31/13 0500 01/01/14 0500 01/02/14 0454 01/03/14 0500 01/04/14 0510  NA 137 138 137 137 136*  K 4.0 3.7 3.5* 3.5* 3.2*  CL 94* 93* 93* 92* 91*  CO2 30 32 31 30 29   GLUCOSE 99 113* 64* 92 84  BUN 93* 89* 90* 89* 88*  CREATININE 3.00* 2.81* 2.99* 2.84* 3.04*  CALCIUM 8.1* 8.4 8.3* 8.3* 8.4   Liver Function Tests:  Recent Labs Lab 12/29/13 0439  AST 21  ALT <5  ALKPHOS 101  BILITOT 0.9  PROT 5.6*  ALBUMIN 2.1*   CBC:  Recent Labs Lab 12/31/13 0500 01/01/14 0500 01/02/14 0454 01/03/14 0500 01/04/14 0510  WBC 5.2 7.6 6.0 6.3 6.1  HGB 8.6* 8.5* 8.1* 8.4* 9.2*  HCT 26.5* 26.3* 25.4* 26.3* 28.0*  MCV 98.5 96.0 96.2 96.7 99.3  PLT 63* 73* 72* 77* 82*   BNP (last 3 results)  Recent Labs  10/27/13 1606 10/30/13 0531 12/21/13 2041  PROBNP 16497.0* 12300.0* 15092.0*   CBG:  Recent Labs Lab 01/03/14 1144 01/03/14 1648 01/03/14 2132 01/04/14 0802 01/04/14 1152  GLUCAP 94 209* 70 89 228*    No results found for this or any previous  visit (from the past 240 hour(s)).   Studies:  Recent x-ray studies have been reviewed in detail by the Attending Physician  Time spent :  89 mins  Cherene Altes, MD [facility] For Consults/Admissions - Flow Manager - [phone] Office  [phone] Pager [phone]  On-Call/Text Page:      [email or web address]      password [facility]  01/04/2014, 2:43 PM   LOS: 14 days

## 2014-01-05 LAB — CBC
HEMATOCRIT: 26.7 % — AB (ref 36.0–46.0)
HEMOGLOBIN: 8.5 g/dL — AB (ref 12.0–15.0)
MCH: 30.8 pg (ref 26.0–34.0)
MCHC: 31.8 g/dL (ref 30.0–36.0)
MCV: 96.7 fL (ref 78.0–100.0)
Platelets: 85 10*3/uL — ABNORMAL LOW (ref 150–400)
RBC: 2.76 MIL/uL — AB (ref 3.87–5.11)
RDW: 19.4 % — ABNORMAL HIGH (ref 11.5–15.5)
WBC: 4.2 10*3/uL (ref 4.0–10.5)

## 2014-01-05 LAB — RENAL FUNCTION PANEL
Albumin: 2.2 g/dL — ABNORMAL LOW (ref 3.5–5.2)
Anion gap: 15 (ref 5–15)
BUN: 95 mg/dL — AB (ref 6–23)
CO2: 28 meq/L (ref 19–32)
Calcium: 8.2 mg/dL — ABNORMAL LOW (ref 8.4–10.5)
Chloride: 92 mEq/L — ABNORMAL LOW (ref 96–112)
Creatinine, Ser: 3.28 mg/dL — ABNORMAL HIGH (ref 0.50–1.10)
GFR calc non Af Amer: 14 mL/min — ABNORMAL LOW (ref >90)
GFR, EST AFRICAN AMERICAN: 16 mL/min — AB (ref >90)
GLUCOSE: 172 mg/dL — AB (ref 70–99)
PHOSPHORUS: 4.1 mg/dL (ref 2.3–4.6)
Potassium: 4.1 mEq/L (ref 3.7–5.3)
SODIUM: 135 meq/L — AB (ref 137–147)

## 2014-01-05 LAB — MAGNESIUM: MAGNESIUM: 2.2 mg/dL (ref 1.5–2.5)

## 2014-01-05 LAB — GLUCOSE, CAPILLARY
GLUCOSE-CAPILLARY: 158 mg/dL — AB (ref 70–99)
Glucose-Capillary: 128 mg/dL — ABNORMAL HIGH (ref 70–99)
Glucose-Capillary: 198 mg/dL — ABNORMAL HIGH (ref 70–99)
Glucose-Capillary: 201 mg/dL — ABNORMAL HIGH (ref 70–99)

## 2014-01-05 NOTE — Progress Notes (Signed)
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 On admission, patient was found to be in acute heart failure with ascites-similar to past admissions. She was also noted to have a creatinine of 3.45 and was hypothermic requiring Customer service manager. Patient placed in stepdown unit and started on IV Lasix. Initially there was concern about sepsis and antibiotics started, discontinued the following morning as no immediate signs of infection noted  11/16 she had not been responding very well to Lasix alone. Seen by nephrology and cardiology. Creatinine progressively worsening. Through son, patient declined central line needed for IV milrinone but Cardiology was able to have PICC placed instead since she is NOT a candidate for HD. She has continued to have issues with hypothermia requiring Retail banker.  11/17 outpt notes from oncology reviewed. Prognosis documented as poor and NOT candidate for systemic treatment of high-grade myelodysplastic syndrome. Dr. Alvy Bimler recommended the option of Hospice but also felt the family at that time was not ready. She also recommended supportive care and to transfuse if Hgb </= 8.0.  Patient has had excellent diuretic response after milrinone initiated in combination with Lasix and metolazone. By 11/24 her creatinine began to rise so the Lasix and metolazone were discontinued. Discontinuation of milrinone accomplished on 11/26.   D/C was planned for 11/27, but had to be delayed as pt had tunneled L chest PICC and IR not available to  remove it.  D/C was rescheduled for 11/28, but pt then developed a fever after removal of her PICC>    HPI/Subjective: Pt denies any new complaints.  Does not look uncomfortable.  Denies cp, sob, n/v, or abdom pain.  No family present at visit today.    Assessment/Plan:  FUO Coincident with removal of PICC line, raising question of cath related transient bacteremia - UA grossly abnormal, suggesting UTI - blood cx negative thus far - urine cx pending - following w/o abx for now as pt is now asymptomatic / no further fever   Acute on chronic combined systolic and diastolic congestive heart failure / ICM / Ascites -Medication adjustments per Cardiology: Lasix and metolazone discontinued 11/25; milrinone continued through 11/26; transitioned to oral Demadex 11/26 -net neg ~24,000+ cc since admission -ascites from passive hepatic congestion; Interventional radiology performed therapeutic paracentesis with [name].[name] L bloody fluid removed  Acute renal failure on CKD, stage IV with cardiorenal syndrome -Nephrology has seen - they signed off 11/18 -Cr has waxed and waned w/ adjustments in diuretic dosing - currently crt is climbing therefore holding demadex until begins to trend down again  -NOT a candidate for HD -Palliative has evaluated this admission  Essential hypertension -BP stable presently   Hyperlipidemia -statin was dc'd in setting of elevated LFTs - recheck LFTs in ~[name] week and consider resuming statin   Type 2 diabetes mellitus with peripheral neuropathy -cont to follow CBGs w/o change today  -HgbA1c was 6.2 Sept 2015   Protein calorie malnutrition -Nutrition evaluated - intake improving w/ food from home   Anemia 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) -MDS driving sx anemia -per Onco OP notes rec is to transfuse for Hgb </= 8.0 -11/17  Hgb 7.0 so was transfused 2 units PRBCs each over 4 hours -current hgb stable and greater than 8.0 -severe MDS alone offers dire  prognosis-Palliative Care has seen, but the family is not interested in hospice care, and desires to continue aggressive medical tx   Exfoliative dermatitis -Not consistent with drug rash -Utilized Neosporin for excoriated wounds; lotions to intact skin with improvement in dermatitis symptoms -Added IV Ancef as prophylaxis to prevent diffuse cellulitis - completed 7 day course    DVT prophylaxis: SCDs Code Status: Full Family Communication: no family present at time of exam  Disposition Plan/Expected LOS: d/c home when remains afebrile and renal fxn improves  Consultants: Cardiology/ Dr. Haroldine Laws Nephrology/Dr. Posey Pronto Palliative Care/Dr. Deitra Mayo  Procedures: Therapeutic paracentesis 11/16 IR R chest tunneled PICC 11/16 > 11/28  Antibiotics: Zosyn 11/14  Vancomycin 11/14  Ancef 11/19 > 11/25  Objective: Blood pressure 128/67, pulse 69, temperature 97.3 F (36.3 C), temperature source Oral, resp. rate 11, height 5\' 4"  ([name].626 m), weight 52.9 kg (116 lb 10 oz), SpO2 100 %.  Intake/Output Summary (Last 24 hours) at 01/05/14 1407 Last data filed at 01/05/14 0841  Gross per 24 hour  Intake    350 ml  Output    525 ml  Net   -175 ml   Exam: Gen: No acute respiratory distress - smiling, pleasant  Chest: Clear to auscultation bilaterally w/o wheeze  Cardiac: Regular rate and rhythm Abdomen: Soft nontender nondistended without obvious hepatosplenomegaly, no ascites Extremities: no signif C/C/E B LE   Scheduled Meds:  Scheduled Meds: . antiseptic oral rinse  7 mL Mouth Rinse BID  . aspirin EC  81 mg Oral Daily  . cefTRIAXone (ROCEPHIN)  IV  [name] g Intravenous Q24H  . feeding supplement (GLUCERNA SHAKE)  237 mL Oral Daily  . hydrALAZINE  12.5 mg Oral 3 times per day  . hydrocerin   Topical BID  . insulin aspart  0-15 Units Subcutaneous TID WC  . insulin aspart  0-5 Units Subcutaneous QHS  . isosorbide mononitrate  30 mg Oral Daily  . lubriderm seriously sensitive   Topical BID   . mirtazapine  15 mg Oral QHS  . neomycin-bacitracin-polymyxin   Topical BID  . torsemide  40 mg Oral BID    Data Reviewed: Basic Metabolic Panel:  Recent Labs Lab 01/01/14 0500 01/02/14 0454 01/03/14 0500 01/04/14 0510 01/05/14 0220  NA 138 137 137 136* 135*  K 3.7 3.5* 3.5* 3.2* 4.[name]  CL 93* 93* 92* 91* 92*  CO2 32 31 30 29 28   GLUCOSE 113* 64* 92 84 172*  BUN 89* 90* 89* 88* 95*  CREATININE 2.81* 2.99* 2.84* 3.04* 3.28*  CALCIUM 8.4 8.3* 8.3* 8.4 8.2*  MG  --   --   --   --  2.2  PHOS  --   --   --   --  4.[name]   Liver Function Tests:  Recent Labs Lab 01/05/14 0220  ALBUMIN 2.2*   CBC:  Recent Labs Lab 01/01/14 0500 01/02/14 0454 01/03/14 0500 01/04/14 0510 01/05/14 0220  WBC 7.6 6.0 6.3 6.[name] 4.2  HGB 8.5* 8.[name]* 8.4* 9.2* 8.5*  HCT 26.3* 25.4* 26.3* 28.0* 26.7*  MCV 96.0 96.2 96.7 99.3 96.7  PLT 73* 72* 77* 82* 85*   BNP (last 3 results)  Recent Labs  10/27/13 1606 10/30/13 0531 12/21/13 2041  PROBNP 16497.0* 12300.0* 15092.0*   CBG:  Recent Labs Lab 01/04/14 1152 01/04/14 1640 01/04/14 2143 01/05/14 0815 01/05/14 1218  GLUCAP 228* 312* 148* 128* 198*    Recent Results (from the past 240 hour(s))  Culture, blood (routine x 2)     Status: None (Preliminary result)   Collection Time: 01/04/14 12:10 PM  Result Value Ref Range Status   Specimen Description BLOOD LEFT HAND  Final   Special Requests BOTTLES DRAWN AEROBIC AND ANAEROBIC 10CC EACH  Final   Culture  Setup Time   Final    01/04/2014 21:18 Performed at [facility]    Culture   Final           BLOOD CULTURE RECEIVED NO GROWTH TO DATE CULTURE WILL BE HELD FOR 5 DAYS BEFORE ISSUING A FINAL NEGATIVE REPORT Performed at [facility]    Report Status PENDING  Incomplete  Culture, blood (routine x 2)     Status: None (Preliminary result)   Collection Time: 01/04/14 12:20 PM  Result Value Ref Range Status   Specimen Description BLOOD RIGHT HAND  Final   Special  Requests BOTTLES DRAWN AEROBIC AND ANAEROBIC 10CC EACH  Final   Culture  Setup Time   Final    01/04/2014 21:18 Performed at [facility]    Culture   Final           BLOOD CULTURE RECEIVED NO GROWTH TO DATE CULTURE WILL BE HELD FOR 5 DAYS BEFORE ISSUING A FINAL NEGATIVE REPORT Performed at [facility]    Report Status PENDING  Incomplete     Studies:  Recent x-ray studies have been reviewed in detail by the Attending Physician  Time spent :  25 mins  Cherene Altes, MD [facility] For Consults/Admissions - Flow Manager - [phone] Office  [phone] Pager [phone]  On-Call/Text Page:      [email or web address]      password [facility]  01/05/2014, 2:07 PM   LOS: 15 days

## 2014-01-05 NOTE — Progress Notes (Signed)
Patient ID: Yailin Biederman, female   DOB: Oct 10, 1950, 63 y.o.   MRN: [id number] Advanced Heart Failure Rounding Note   Subjective:    63 yo with history of CAD and systolic HF due to ischemic cardiomyopathy with EF 30-35% andpulmonary venous hypertension, myelodysplastic syndrome, GI bleed, and CKD . Admitted with volume overload and acute on chronic renal failure.   Was scheduled for d/c yesterday but became febrile when PICC was pulled so kept in house. BCx drawn and are negative so far. Afebrile overnight.  Denies dyspnea.   Cr climbing again. 2.8 -> 3.0 -> 3.28 Weight near baseline.    Objective:   Weight Range:  Vital Signs:   Temp:  [97.2 F (36.2 C)-100.7 F (38.2 C)] 97.2 F (36.2 C) (11/29 0817) Pulse Rate:  [66-90] 66 (11/29 0817) Resp:  [12-24] 12 (11/29 0817) BP: (101-137)/(50-63) 116/61 mmHg (11/29 0817) SpO2:  [96 %-100 %] 100 % (11/29 0817) Weight:  [52.9 kg (116 lb 10 oz)] 52.9 kg (116 lb 10 oz) (11/29 0405) Last BM Date: 01/04/14  Weight change: Filed Weights   01/03/14 0500 01/04/14 0421 01/05/14 0405  Weight: 49.8 kg (109 lb 12.6 oz) 54.3 kg (119 lb 11.4 oz) 52.9 kg (116 lb 10 oz)    Intake/Output:   Intake/Output Summary (Last 24 hours) at 01/05/14 1156 Last data filed at 01/05/14 0841  Gross per 24 hour  Intake    410 ml  Output    525 ml  Net   -115 ml     Physical Exam: General:  Chronically ill appearing. No resp difficulty HEENT: normal diffuse exfoliative rash Neck: supple. JVP jaw Carotids 2+ bilat; no bruits. No lymphadenopathy or thryomegaly appreciated. Cor: PMI nondisplaced. Regular rate & rhythm. No rubs,  or murmurs. + S3  R chest power PICC Lungs: clear Abdomen: soft, nontender, +distended. No hepatosplenomegaly. No bruits or masses. Good bowel sounds. Extremities: no cyanosis, clubbing, rash trace tr leg edema  RUE infiltrate Neuro: alert & orientedx3, cranial nerves grossly intact. moves all 4 extremities w/o difficulty. Affect  pleasant Skin: Dry scales all over her body.   Telemetry: SR   Labs: Basic Metabolic Panel:  Recent Labs Lab 01/01/14 0500 01/02/14 0454 01/03/14 0500 01/04/14 0510 01/05/14 0220  NA 138 137 137 136* 135*  K 3.7 3.5* 3.5* 3.2* 4.1  CL 93* 93* 92* 91* 92*  CO2 32 31 30 29 28   GLUCOSE 113* 64* 92 84 172*  BUN 89* 90* 89* 88* 95*  CREATININE 2.81* 2.99* 2.84* 3.04* 3.28*  CALCIUM 8.4 8.3* 8.3* 8.4 8.2*  MG  --   --   --   --  2.2  PHOS  --   --   --   --  4.1    Liver Function Tests:  Recent Labs Lab 01/05/14 0220  ALBUMIN 2.2*   No results for input(s): LIPASE, AMYLASE in the last 168 hours. No results for input(s): AMMONIA in the last 168 hours.  CBC:  Recent Labs Lab 01/01/14 0500 01/02/14 0454 01/03/14 0500 01/04/14 0510 01/05/14 0220  WBC 7.6 6.0 6.3 6.1 4.2  HGB 8.5* 8.1* 8.4* 9.2* 8.5*  HCT 26.3* 25.4* 26.3* 28.0* 26.7*  MCV 96.0 96.2 96.7 99.3 96.7  PLT 73* 72* 77* 82* 85*    Cardiac Enzymes: No results for input(s): CKTOTAL, CKMB, CKMBINDEX, TROPONINI in the last 168 hours.  BNP: BNP (last 3 results)  Recent Labs  10/27/13 1606 10/30/13 0531 12/21/13 2041  PROBNP 16497.0* 12300.0* 15092.0*  Other results:    Imaging: Ir Removal Tun Cv Cath W/o [facility]  01/04/2014   CLINICAL DATA:  Tunneled PICC catheter, fever, chills  EXAM: TUNNELED RIGHT IJ CATHETER REMOVAL:  TECHNIQUE: Overlying skin prepped with chlorhexidine, draped in usual sterile fashion . The previously placed tunneled right IJ catheter was dissected free from the underlying soft tissues and removed intact. Hemostasis was achieved. Site covered with a sterile dressing. The patient tolerated the procedure well, without any complication.  IMPRESSION: 1. Technically successful tunneled right IJ catheter removal.   Electronically Signed   By: Arne Cleveland M.D.   On: 01/04/2014 11:44     Medications:     Scheduled Medications: . antiseptic oral rinse  7 mL Mouth Rinse BID   . aspirin EC  81 mg Oral Daily  . cefTRIAXone (ROCEPHIN)  IV  1 g Intravenous Q24H  . feeding supplement (GLUCERNA SHAKE)  237 mL Oral Daily  . hydrALAZINE  12.5 mg Oral 3 times per day  . hydrocerin   Topical BID  . insulin aspart  0-15 Units Subcutaneous TID WC  . insulin aspart  0-5 Units Subcutaneous QHS  . isosorbide mononitrate  30 mg Oral Daily  . lubriderm seriously sensitive   Topical BID  . mirtazapine  15 mg Oral QHS  . neomycin-bacitracin-polymyxin   Topical BID  . torsemide  40 mg Oral BID    Infusions:    PRN Medications: acetaminophen **OR** acetaminophen, camphor-menthol, hydrOXYzine, ondansetron **OR** ondansetron (ZOFRAN) IV, oxyCODONE, zolpidem   Assessment:   1. A/C Systolic Heart Failure. Ischemic cardiomyopathy, EF 30-35% with cardiorenal syndrome 2. Acute on chronic renal failure Stage IV: Not a good HD candidate.  3. DM2 4. H/O Gout 5. H/O GI bleed 10/2013  6. Diffuse scaly rash 7. MDS with pancytopenia 8. Fever - possible line infection    Plan/Discussion:    Renal function worse today. Weight at baseline. Will hold torsemide today. Can cut back to 40 daily tomorrow if renal function better.    Prognosis very poor.   Daniel Bensimhon,MD 11:56 AM

## 2014-01-05 NOTE — Plan of Care (Signed)
Problem: Phase III Progression Outcomes Goal: Pain controlled on oral analgesia Outcome: Completed/Met Date Met:  01/05/14 Goal: Activity at appropriate level-compared to baseline (UP IN CHAIR FOR HEMODIALYSIS)  Outcome: Completed/Met Date Met:  01/05/14 Goal: Tolerating diet Outcome: Completed/Met Date Met:  01/05/14 Goal: Dyspnea controlled with activity Outcome: Completed/Met Date Met:  01/05/14

## 2014-01-06 DIAGNOSIS — A499 Bacterial infection, unspecified: Secondary | ICD-10-CM | POA: Diagnosis not present

## 2014-01-06 DIAGNOSIS — N390 Urinary tract infection, site not specified: Secondary | ICD-10-CM

## 2014-01-06 DIAGNOSIS — A498 Other bacterial infections of unspecified site: Secondary | ICD-10-CM | POA: Diagnosis not present

## 2014-01-06 DIAGNOSIS — R7881 Bacteremia: Secondary | ICD-10-CM

## 2014-01-06 LAB — BASIC METABOLIC PANEL
Anion gap: 15 (ref 5–15)
BUN: 93 mg/dL — AB (ref 6–23)
CALCIUM: 8.2 mg/dL — AB (ref 8.4–10.5)
CO2: 28 mEq/L (ref 19–32)
CREATININE: 3.14 mg/dL — AB (ref 0.50–1.10)
Chloride: 92 mEq/L — ABNORMAL LOW (ref 96–112)
GFR calc non Af Amer: 15 mL/min — ABNORMAL LOW (ref >90)
GFR, EST AFRICAN AMERICAN: 17 mL/min — AB (ref >90)
Glucose, Bld: 151 mg/dL — ABNORMAL HIGH (ref 70–99)
Potassium: 4.2 mEq/L (ref 3.7–5.3)
Sodium: 135 mEq/L — ABNORMAL LOW (ref 137–147)

## 2014-01-06 LAB — CBC
HCT: 26.6 % — ABNORMAL LOW (ref 36.0–46.0)
HEMOGLOBIN: 8.6 g/dL — AB (ref 12.0–15.0)
MCH: 31 pg (ref 26.0–34.0)
MCHC: 32.3 g/dL (ref 30.0–36.0)
MCV: 96 fL (ref 78.0–100.0)
Platelets: 87 10*3/uL — ABNORMAL LOW (ref 150–400)
RBC: 2.77 MIL/uL — ABNORMAL LOW (ref 3.87–5.11)
RDW: 19.1 % — AB (ref 11.5–15.5)
WBC: 4.2 10*3/uL (ref 4.0–10.5)

## 2014-01-06 LAB — GLUCOSE, CAPILLARY
GLUCOSE-CAPILLARY: 125 mg/dL — AB (ref 70–99)
GLUCOSE-CAPILLARY: 111 mg/dL — AB (ref 70–99)
GLUCOSE-CAPILLARY: 156 mg/dL — AB (ref 70–99)
Glucose-Capillary: 155 mg/dL — ABNORMAL HIGH (ref 70–99)

## 2014-01-06 MED ORDER — IMIPENEM-CILASTATIN 250 MG IV SOLR
250.0000 mg | Freq: Two times a day (BID) | INTRAVENOUS | Status: DC
  Administered 2014-01-06 – 2014-01-08 (×5): 250 mg via INTRAVENOUS
  Filled 2014-01-06 (×6): qty 250

## 2014-01-06 MED ORDER — TORSEMIDE 20 MG PO TABS
40.0000 mg | ORAL_TABLET | Freq: Every day | ORAL | Status: DC
  Administered 2014-01-06 – 2014-01-10 (×5): 40 mg via ORAL
  Filled 2014-01-06 (×5): qty 2

## 2014-01-06 NOTE — Progress Notes (Signed)
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 On admission, patient was found to be in acute heart failure with ascites-similar to past admissions. She was also noted to have a creatinine of 3.45 and was hypothermic requiring Customer service manager. Patient placed in stepdown unit and started on IV Lasix. Initially there was concern about sepsis and antibiotics started, discontinued the following morning as no immediate signs of infection noted  11/16 she had not been responding very well to Lasix alone. Seen by nephrology and cardiology. Creatinine progressively worsening. Through son, patient declined central line needed for IV milrinone but Cardiology was able to have PICC placed instead since she is NOT a candidate for HD. She has continued to have issues with hypothermia requiring Retail banker.  11/17 outpt notes from oncology reviewed. Prognosis documented as poor and NOT candidate for systemic treatment of high-grade myelodysplastic syndrome. Dr. Alvy Bimler recommended the option of Hospice but also felt the family at that time was not ready. She also recommended supportive care and to transfuse if Hgb </= 8.0.  Patient has had excellent diuretic response after milrinone initiated in combination with Lasix and metolazone. By 11/24 her creatinine began to rise so the Lasix and metolazone were discontinued. Discontinuation of milrinone accomplished on 11/26.   D/C was planned for 11/27, but had to be delayed as pt had tunneled L chest PICC and IR not available to  remove it.  D/C was rescheduled for 11/28, but pt then developed a fever after removal of her PICC. Blood and urine cultures were obtained at that time. Blood cultures have returned 2/2 positive for gram-negative rods and urine culture has returned with greater than 100,000 colonies of pseudomonas aeruginosa with sensitivities pending. Patient had been placed on Rocephin. By 11/30 Rocephin was discontinued in favor of Primaxin.  HPI/Subjective: Awake but less interactive than when I last saw her last Wednesday. No specific complaints.  Assessment/Plan:  Pseudomonas UTI  -Sensitivities pending -Discontinue Rocephin in favor of Primaxin -Avoid Foley catheter-Foley catheter was discontinued on 11/25 and had been inserted on 11/15  2/2 Gram-negative rod bacteremia -2/2 positive and given above documented urinary tract infection suspect source organism will be Pseudomonas -Antibiotics as outlined above -Currently hemodynamically stable and no signs of sepsis  Acute on chronic combined systolic and diastolic congestive heart failure / ICM / Ascites -Medication adjustments per Cardiology: Lasix and metolazone discontinued 11/25; milrinone continued through 11/26; transitioned to oral Demadex 11/26-held briefly due to worsening renal function but resumed again 11/30 -net neg ~24,000+ cc since admission -ascites from passive hepatic congestion; Interventional radiology performed therapeutic paracentesis with 1.1 L bloody fluid removed  Acute renal failure on CKD, stage IV with cardiorenal syndrome -Nephrology has seen - they signed off 11/18 -Cr has waxed and waned w/ adjustments in diuretic dosing  -crt had been climbing leading [place] to hold her demadex; current trend is down so cardiology has resumed at 40 mg daily as of 11/30 -NOT a candidate for HD -Palliative has evaluated this admission  Essential hypertension -BP stable presently   Hyperlipidemia -statin was dc'd in setting of elevated  LFTs  -recheck LFTs in ~1 week  and consider resuming statin   Type 2 diabetes mellitus with peripheral neuropathy -cont to follow CBGs w/o change today  -HgbA1c was 6.2 Sept 2015   Protein calorie malnutrition -Nutrition evaluated - intake improved after w/ food brought in from home   Anemia 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) -MDS driving sx anemia -per [facility] OP notes rec is to transfuse for Hgb </= 8.0 -11/17 Hgb 7.0 so was transfused 2 units PRBCs each over 4 hours -current hgb stable and greater than 8.0 -severe MDS alone offers dire prognosis-Palliative Care has seen, but the family is not interested in hospice care, and desires to continue aggressive medical tx   Exfoliative dermatitis -Not consistent with drug rash -Utilized Neosporin for excoriated wounds; lotions to intact skin with improvement in dermatitis symptoms -Added IV Ancef as prophylaxis to prevent diffuse cellulitis - completed 7 day course   DVT prophylaxis: SCDs Code Status: Full Family Communication: Spoke with patient's son Lenn Cal via telephone to update on current findings of bacteremia and urinary tract infection-asked him to relay this information to his mother since interpreter not immediately available-all other questions answered Disposition Plan/Expected LOS: Stepdown-given gram-negative bacteremia high risk to evolve into sepsis syndrome  Consultants: Cardiology/ Dr. Haroldine Laws Nephrology/Dr. Posey Pronto Palliative Care/Dr. Deitra Mayo  Procedures: Therapeutic paracentesis 11/16 IR R chest tunneled PICC 11/16 > 11/28  Antibiotics: Zosyn 11/14  Vancomycin 11/14  Ancef 11/19 > 11/25 Rocephin 11/28 > 11/29 Primaxin 11/30 >  Objective: Blood pressure 112/62, pulse 71, temperature 98.2 F (36.8 C), temperature source Oral, resp. rate 14, height 5\' 4"  (1.626 m), weight 110 lb 7.2 oz (50.1 kg), SpO2 100 %.  Intake/Output Summary (Last 24 hours) at 01/06/14 1251 Last data filed at 01/06/14  0853  Gross per 24 hour  Intake    480 ml  Output   1200 ml  Net   -720 ml   Exam: Gen: No acute respiratory distress - looks tired and acutely ill but otherwise is alert and appropriate Chest: Clear to auscultation bilaterally w/o wheeze  Cardiac: Regular rate and rhythm Abdomen: Soft nontender nondistended without obvious hepatosplenomegaly, no ascites Extremities: without cyanosis or clubbing, previous exfoliative dermatitis skin changes improving   Scheduled Meds:  Scheduled Meds: . antiseptic oral rinse  7 mL Mouth Rinse BID  . aspirin EC  81 mg Oral Daily  . feeding supplement (GLUCERNA SHAKE)  237 mL Oral Daily  . hydrALAZINE  12.5 mg Oral 3 times per day  . hydrocerin   Topical BID  . imipenem-cilastatin  250 mg Intravenous Q12H  . insulin aspart  0-15 Units Subcutaneous TID WC  . insulin aspart  0-5 Units Subcutaneous QHS  . isosorbide mononitrate  30 mg Oral Daily  . lubriderm seriously sensitive   Topical BID  . mirtazapine  15 mg Oral QHS  . neomycin-bacitracin-polymyxin   Topical BID  . torsemide  40 mg Oral Daily    Data Reviewed: Basic Metabolic Panel:  Recent Labs Lab 01/02/14 0454 01/03/14 0500 01/04/14 0510 01/05/14 0220 01/06/14 0232  NA 137 137 136* 135* 135*  K 3.5* 3.5* 3.2* 4.1 4.2  CL 93* 92* 91* 92* 92*  CO2 31 30 29 28 28   GLUCOSE 64* 92 84 172* 151*  BUN 90* 89* 88* 95* 93*  CREATININE 2.99* 2.84* 3.04* 3.28* 3.14*  CALCIUM 8.3* 8.3* 8.4 8.2* 8.2*  MG  --   --   --  2.2  --   PHOS  --   --   --  4.1  --    Liver Function Tests:  Recent Labs Lab 01/05/14 0220  ALBUMIN 2.2*   CBC:  Recent Labs Lab 01/02/14 0454 01/03/14 0500 01/04/14 0510 01/05/14 0220 01/06/14 0232  WBC 6.0 6.3 6.1 4.2 4.2  HGB 8.1* 8.4* 9.2* 8.5* 8.6*  HCT 25.4* 26.3* 28.0* 26.7* 26.6*  MCV 96.2 96.7 99.3 96.7 96.0  PLT 72* 77* 82* 85* 87*   BNP (last 3 results)  Recent Labs  10/27/13 1606 10/30/13 0531 12/21/13 2041  PROBNP 16497.0* 12300.0*  15092.0*   CBG:  Recent Labs Lab 01/05/14 1218 01/05/14 1643 01/05/14 2241 01/06/14 0755 01/06/14 1225  GLUCAP 198* 158* 201* 125* 111*    Recent Results (from the past 240 hour(s))  Culture, blood (routine x 2)     Status: None (Preliminary result)   Collection Time: 01/04/14 12:10 PM  Result Value Ref Range Status   Specimen Description BLOOD LEFT HAND  Final   Special Requests BOTTLES DRAWN AEROBIC AND ANAEROBIC 10CC EACH  Final   Culture  Setup Time   Final    01/04/2014 21:18 Performed at [facility] Note: Gram Stain Report Called to,Read Back By and Verified With: CHRISTY JOHNSON 01/06/14 1110 BY SMITHERSJ Performed at [facility]    Report Status PENDING  Incomplete  Culture, blood (routine x 2)     Status: None (Preliminary result)   Collection Time: 01/04/14 12:20 PM  Result Value Ref Range Status   Specimen Description BLOOD RIGHT HAND  Final   Special Requests BOTTLES DRAWN AEROBIC AND ANAEROBIC 10CC EACH  Final   Culture  Setup Time   Final    01/04/2014 21:18 Performed at [facility]    Culture   Final    GRAM NEGATIVE RODS Note: Culture results may be compromised due to an excessive volume of blood received in culture bottles. Gram Stain Report Called to,Read Back By and Verified With: Vedia Coffer RN on 01/06/14 at 06:15 by Rise Mu Performed at [facility]    Report Status PENDING  Incomplete  Culture, Urine     Status: None (Preliminary result)   Collection Time: 01/04/14 10:44 PM  Result Value Ref Range Status   Specimen Description URINE, CLEAN CATCH  Final   Special Requests NONE  Final   Culture  Setup Time   Final    01/05/2014 15:25 Performed at [facility]   Final    >=100,000 COLONIES/ML Performed at [facility]    Culture   Final    PSEUDOMONAS AERUGINOSA Performed at [facility]    Report Status PENDING   Incomplete     Studies:  Recent x-ray studies have been reviewed in detail by the Attending Physician  Time spent :  35 mins  Erin Hearing, ANP [facility] - [phone] Office  [phone] Pager [phone]  On-Call/Text Page:      [email or web address]      password [facility]  01/06/2014, 12:51 PM   LOS: 16 days   I have personally examined this patient and reviewed the entire database. I have reviewed the above note, made any necessary editorial changes, and agree with its content.  Cherene Altes, MD [facility]

## 2014-01-06 NOTE — Consult Note (Signed)
ANTIBIOTIC CONSULT NOTE - INITIAL  Pharmacy Consult for Primaxin Indication: bacteremia  No Known Allergies  Patient Measurements: Height: 5\' 4"  (162.6 cm) Weight: 110 lb 7.2 oz (50.1 kg) IBW/kg (Calculated) : 54.7  Vital Signs: Temp: 98.2 F (36.8 C) (11/30 0750) Temp Source: Oral (11/30 0750) BP: 123/60 mmHg (11/30 0750) Pulse Rate: 72 (11/30 0750) Intake/Output from previous day: 11/29 0701 - 11/30 0700 In: 240 [P.O.:240] Out: 1400 [Urine:1400] Intake/Output from this shift: Total I/O In: 240 [P.O.:240] Out: -   Labs:  Recent Labs  01/04/14 0510 01/05/14 0220 01/06/14 0232  WBC 6.1 4.2 4.2  HGB 9.2* 8.5* 8.6*  PLT 82* 85* 87*  CREATININE 3.04* 3.28* 3.14*   Estimated Creatinine Clearance: 14.3 mL/min (by C-G formula based on Cr of 3.14).  Medical History: Past Medical History  Diagnosis Date  . Hypertension   . Pneumonia   . CHF (congestive heart failure) 08/2012  . Anemia 08/2012  . UTI (urinary tract infection) 08/22/2012  . Ischemic cardiomyopathy     EF 30-35%  . Coronary artery disease   . Pulmonary hypertension   . NSVT (nonsustained ventricular tachycardia) 02/2013  . Diabetes mellitus     Reportedly diagnosed 2011 but no medication initiated until 04/2011 (Metformin)  . Diabetic peripheral neuropathy     Since 2013  . Thrombocytopenia   . CKD (chronic kidney disease)   . Chronic systolic CHF (congestive heart failure)     Echo (10/16/13):  EF 30% to 35%. Akinesis of inf-lat and inferior, anterior, anterolateral, and apical myocardium. Restrictive physiology.  Mild AI, MAC, mod MR, mild LAE, mild RVE, mild reduced RVSF, mild RAE, mod TR, Mod PI, PASP 79 mmHg, L pleural effusion.  Marland Kitchen Uncontrolled diabetes mellitus   . HTN (hypertension)   . Pulmonary HTN     a. Cath 02/2013: severe pulm HTN.  . CAD (coronary artery disease)     a. Cath 02/2013: severe 2V CAD - 100% mRCA with L-R collaterals; diffuse 80-95% subtotal OM2 (too diffusely diseased to  attempt PCA), overall small caliber diffusely diseased coronaries c/w poorly controlled DM; treated medically.  . CKD (chronic kidney disease), stage IV   . Ischemic cardiomyopathy   . Anemia     a. Prior normal EGD 03/2013. b. Progressive anemia 06/2013 in setting of transient hematemesis that had resolved.   . Thrombocytopenia     a. Liver imaging normal 03/2013.  . Edema     a. RUE edema with neg duplex for DVT 06/2013.  Marland Kitchen Protein calorie malnutrition   . Poor social situation   . Ascites     a. s/p paracentesis 03/2013.  . Valvular heart disease     a. Echo 06/2013: mild AI, mod MR, mod TR, mod PR.   Assessment: 63yof known to pharmacy from previous antibiotic dosing now with 1/2 blood cultures growing gram negative rods. Antibiotics will be broadened to primaxin pending culture growth/sensitivities. CKD noted with sCr 3.14 and CrCl ~ 38ml/min.  Vancomycin 11/14>>11/15 Zosyn 11/14>>11/15 Ancef 11/18>>11/25 11/29 Ceftriaxone>>11/30 11/30 Imipenem>>  11/14 BCx2 - neg 11/14 UCx - neg 11/28 blood: 1/2 GNR  Goal of Therapy:  Appropriate primaxin dosing  Plan:  1) Primaxin 250mg  IV q12 2) Follow renal function, cultures   Deboraha Sprang 01/06/2014,11:21 AM

## 2014-01-06 NOTE — Progress Notes (Signed)
Patient ID: Camaria Gerald, female   DOB: 1950/06/20, 63 y.o.   MRN: [id number] Advanced Heart Failure Rounding Note   Subjective:    63 yo with history of CAD and systolic HF due to ischemic cardiomyopathy with EF 30-35% and pulmonary venous hypertension, myelodysplastic syndrome, GI bleed, and CKD . Admitted with volume overload and acute on chronic renal failure.   Was scheduled for d/c 11/28 but became febrile when PICC was pulled so kept in house. BCx drawn 1/2 GNR.  Afebrile overnight.  Denies dyspnea.   Demadex on hold. Cr slightly improved. Cr climbing again. 2.8 -> 3.0 -> 3..28 -> 3.14 Weight near baseline.    Objective:   Weight Range:  Vital Signs:   Temp:  [97.2 F (36.2 C)-98.9 F (37.2 C)] 98.2 F (36.8 C) (11/30 0750) Pulse Rate:  [66-83] 72 (11/30 0750) Resp:  [11-40] 12 (11/30 0750) BP: (108-128)/(36-78) 123/60 mmHg (11/30 0750) SpO2:  [99 %-100 %] 100 % (11/30 0750) Weight:  [50.1 kg (110 lb 7.2 oz)] 50.1 kg (110 lb 7.2 oz) (11/30 0407) Last BM Date: 01/04/14  Weight change: Filed Weights   01/04/14 0421 01/05/14 0405 01/06/14 0407  Weight: 54.3 kg (119 lb 11.4 oz) 52.9 kg (116 lb 10 oz) 50.1 kg (110 lb 7.2 oz)    Intake/Output:   Intake/Output Summary (Last 24 hours) at 01/06/14 0806 Last data filed at 01/06/14 0653  Gross per 24 hour  Intake    240 ml  Output   1400 ml  Net  -1160 ml     Physical Exam: General:  Chronically ill appearing. No resp difficulty HEENT: normal diffuse exfoliative rash Neck: supple. JVP jaw Carotids 2+ bilat; no bruits. No lymphadenopathy or thryomegaly appreciated. Cor: PMI nondisplaced. Regular rate & rhythm. No rubs,  or murmurs. + S3  R chest power PICC Lungs: clear Abdomen: soft, nontender, +distended. No hepatosplenomegaly. No bruits or masses. Good bowel sounds. Extremities: no cyanosis, clubbing, rash trace tr leg edema  RUE infiltrate Neuro: alert & orientedx3, cranial nerves grossly intact. moves all 4 extremities  w/o difficulty. Affect pleasant Skin: Dry scales all over her body.   Telemetry: SR   Labs: Basic Metabolic Panel:  Recent Labs Lab 01/02/14 0454 01/03/14 0500 01/04/14 0510 01/05/14 0220 01/06/14 0232  NA 137 137 136* 135* 135*  K 3.5* 3.5* 3.2* 4.1 4.2  CL 93* 92* 91* 92* 92*  CO2 31 30 29 28 28   GLUCOSE 64* 92 84 172* 151*  BUN 90* 89* 88* 95* 93*  CREATININE 2.99* 2.84* 3.04* 3.28* 3.14*  CALCIUM 8.3* 8.3* 8.4 8.2* 8.2*  MG  --   --   --  2.2  --   PHOS  --   --   --  4.1  --     Liver Function Tests:  Recent Labs Lab 01/05/14 0220  ALBUMIN 2.2*   No results for input(s): LIPASE, AMYLASE in the last 168 hours. No results for input(s): AMMONIA in the last 168 hours.  CBC:  Recent Labs Lab 01/02/14 0454 01/03/14 0500 01/04/14 0510 01/05/14 0220 01/06/14 0232  WBC 6.0 6.3 6.1 4.2 4.2  HGB 8.1* 8.4* 9.2* 8.5* 8.6*  HCT 25.4* 26.3* 28.0* 26.7* 26.6*  MCV 96.2 96.7 99.3 96.7 96.0  PLT 72* 77* 82* 85* 87*    Cardiac Enzymes: No results for input(s): CKTOTAL, CKMB, CKMBINDEX, TROPONINI in the last 168 hours.  BNP: BNP (last 3 results)  Recent Labs  10/27/13 1606 10/30/13 0531 12/21/13 2041  PROBNP 16497.0* 12300.0* 15092.0*     Other results:    Imaging: Ir Removal Tun Cv Cath W/o Fl  01/04/2014   CLINICAL DATA:  Tunneled PICC catheter, fever, chills  EXAM: TUNNELED RIGHT IJ CATHETER REMOVAL:  TECHNIQUE: Overlying skin prepped with chlorhexidine, draped in usual sterile fashion . The previously placed tunneled right IJ catheter was dissected free from the underlying soft tissues and removed intact. Hemostasis was achieved. Site covered with a sterile dressing. The patient tolerated the procedure well, without any complication.  IMPRESSION: 1. Technically successful tunneled right IJ catheter removal.   Electronically Signed   By: Arne Cleveland M.D.   On: 01/04/2014 11:44     Medications:     Scheduled Medications: . antiseptic oral  rinse  7 mL Mouth Rinse BID  . aspirin EC  81 mg Oral Daily  . cefTRIAXone (ROCEPHIN)  IV  1 g Intravenous Q24H  . feeding supplement (GLUCERNA SHAKE)  237 mL Oral Daily  . hydrALAZINE  12.5 mg Oral 3 times per day  . hydrocerin   Topical BID  . insulin aspart  0-15 Units Subcutaneous TID WC  . insulin aspart  0-5 Units Subcutaneous QHS  . isosorbide mononitrate  30 mg Oral Daily  . lubriderm seriously sensitive   Topical BID  . mirtazapine  15 mg Oral QHS  . neomycin-bacitracin-polymyxin   Topical BID    Infusions:    PRN Medications: acetaminophen **OR** acetaminophen, camphor-menthol, hydrOXYzine, ondansetron **OR** ondansetron (ZOFRAN) IV, oxyCODONE, zolpidem   Assessment:   1. A/C Systolic Heart Failure. Ischemic cardiomyopathy, EF 30-35% with cardiorenal syndrome 2. Acute on chronic renal failure Stage IV: Not a good HD candidate.  3. DM2 4. H/O Gout 5. H/O GI bleed 10/2013  6. Diffuse scaly rash 7. MDS with pancytopenia 8. Fever - possible line infection. BCx 1/2 GNR   Plan/Discussion:    Renal function slightly improved. CVP still up. Weight at baseline. Will resume torsemide at 40mg  daily and follow creatinine.   GNR bacteremia per [facility]. On Ceftriaxone.   Prognosis overall very poor.   Kamara Allan,MD 8:06 AM

## 2014-01-06 NOTE — Progress Notes (Signed)
Physical Therapy Treatment Patient Details Name: Laurianne Floresca MRN: [id number] DOB: 26-May-1950 Today's Date: 09-Jan-2014    History of Present Illness 63 yo with history of CAD and systolic HF and pulmonary venous hypertension, myelodysplastic syndrome, GI bleed, and CKD . Admitted with volume overload and acute on chronic renal failure.     PT Comments    Progressing with ambulation distance, endurance and safety with walker use.  Needs continued skilled PT in this setting to maximize functional level and safety prior to d/c.  Follow Up Recommendations  No PT follow up;Supervision for mobility/OOB     Equipment Recommendations  Rolling walker with 5" wheels    Recommendations for Other Services       Precautions / Restrictions Precautions Precautions: Fall    Mobility  Bed Mobility Overal bed mobility: Needs Assistance Bed Mobility: Supine to Sit           General bed mobility comments: supervision for safety due to lines  Transfers Overall transfer level: Needs assistance Equipment used: Rolling walker (2 wheeled) Transfers: Sit to/from Stand Sit to Stand: Min guard         General transfer comment: minimal UE support needed, used hands to pull up mesh briefs, legs braced against edge of bed  Ambulation/Gait Ambulation/Gait assistance: Min guard;Supervision Ambulation Distance (Feet): 250 Feet   Gait Pattern/deviations: Step-through pattern;Decreased stride length     General Gait Details: confident and desiring to walk far today.  Denies incontinence, but wearing brief and pad.  VSS   Stairs            Wheelchair Mobility    Modified Rankin (Stroke Patients Only)       Balance Overall balance assessment: Needs assistance         Standing balance support: No upper extremity supported;During functional activity Standing balance-Leahy Scale: Poor Standing balance comment: backs of legs braced against bed while pulling up mesh briefs                     Cognition Arousal/Alertness: Awake/alert Behavior During Therapy: WFL for tasks assessed/performed Overall Cognitive Status: Difficult to assess                      Exercises      General Comments        Pertinent Vitals/Pain Pain Assessment: No/denies pain    Home Living                      Prior Function            PT Goals (current goals can now be found in the care plan section) Progress towards PT goals: Progressing toward goals    Frequency  Min 3X/week    PT Plan Current plan remains appropriate    Co-evaluation             End of Session Equipment Utilized During Treatment: Gait belt Activity Tolerance: Patient tolerated treatment well Patient left: in chair;with call bell/phone within reach     Time: 1343-1413 PT Time Calculation (min) (ACUTE ONLY): 30 min  Charges:  $Gait Training: 8-22 mins $Therapeutic Activity: 8-22 mins                    G Codes:      Cassidee Deats,CYNDI 01/09/14, 2:38 PM Magda Kiel, [place] 09-Jan-2014

## 2014-01-06 NOTE — Progress Notes (Signed)
NUTRITION FOLLOW UP  INTERVENTION: Continue Glucerna Shake po daily, each supplement provides 220 kcal and 10 grams of protein RD to follow for nutrition care plan  NUTRITION DIAGNOSIS: Inadequate oral intake, improving  Goal: Pt to meet >/= 90% of their estimated nutrition needs, progressing  Monitor:  PO & supplemental intake, weight, labs, I/O's  ASSESSMENT: 63 year old [name] with PMH of DM, ischemic cardiomyopathy-medically managed, systolic/diastolic heart failure, stage IV CKD; admitted for worsening shortness of breath times several days.   Pt continues on a Heart Healthy diet.  PO intake remains variable at 25-100% per flowsheet records.  On Remeron.  Drinking her Glucerna Shake supplement daily.  Palliative Care Team notes reviewed.  Prognosis overall very poor.  Height: Ht Readings from Last 1 Encounters:  01/02/14 5\' 4"  (1.626 m)    Weight: Wt Readings from Last 1 Encounters:  01/06/14 110 lb 7.2 oz (50.1 kg)    BMI:  Body mass index is 18.95 kg/(m^2).   Estimated Nutritional Needs: Kcal: 1300-1500 Protein: 65-75 gm Fluid: >/= 1.5 L  Skin: Intact  Diet Order: Diet - low sodium heart healthy Diet heart healthy/carb modified   Intake/Output Summary (Last 24 hours) at 01/06/14 1126 Last data filed at 01/06/14 0853  Gross per 24 hour  Intake    480 ml  Output   1200 ml  Net   -720 ml    Labs:   Recent Labs Lab 01/04/14 0510 01/05/14 0220 01/06/14 0232  NA 136* 135* 135*  K 3.2* 4.1 4.2  CL 91* 92* 92*  CO2 29 28 28   BUN 88* 95* 93*  CREATININE 3.04* 3.28* 3.14*  CALCIUM 8.4 8.2* 8.2*  MG  --  2.2  --   PHOS  --  4.1  --   GLUCOSE 84 172* 151*    CBG (last 3)   Recent Labs  01/05/14 1643 01/05/14 2241 01/06/14 0755  GLUCAP 158* 201* 125*    Scheduled Meds: . antiseptic oral rinse  7 mL Mouth Rinse BID  . aspirin EC  81 mg Oral Daily  . feeding supplement (GLUCERNA SHAKE)  237 mL Oral Daily  . hydrALAZINE  12.5 mg  Oral 3 times per day  . hydrocerin   Topical BID  . imipenem-cilastatin  250 mg Intravenous Q12H  . insulin aspart  0-15 Units Subcutaneous TID WC  . insulin aspart  0-5 Units Subcutaneous QHS  . isosorbide mononitrate  30 mg Oral Daily  . lubriderm seriously sensitive   Topical BID  . mirtazapine  15 mg Oral QHS  . neomycin-bacitracin-polymyxin   Topical BID  . torsemide  40 mg Oral Daily    Continuous Infusions:    Past Medical History  Diagnosis Date  . Hypertension   . Pneumonia   . CHF (congestive heart failure) 08/2012  . Anemia 08/2012  . UTI (urinary tract infection) 08/22/2012  . Ischemic cardiomyopathy     EF 30-35%  . Coronary artery disease   . Pulmonary hypertension   . NSVT (nonsustained ventricular tachycardia) 02/2013  . Diabetes mellitus     Reportedly diagnosed 2011 but no medication initiated until 04/2011 (Metformin)  . Diabetic peripheral neuropathy     Since 2013  . Thrombocytopenia   . CKD (chronic kidney disease)   . Chronic systolic CHF (congestive heart failure)     Echo (10/16/13):  EF 30% to 35%. Akinesis of inf-lat and inferior, anterior, anterolateral, and apical myocardium. Restrictive physiology.  Mild AI, MAC, mod MR, mild  LAE, mild RVE, mild reduced RVSF, mild RAE, mod TR, Mod PI, PASP 79 mmHg, L pleural effusion.  Marland Kitchen Uncontrolled diabetes mellitus   . HTN (hypertension)   . Pulmonary HTN     a. Cath 02/2013: severe pulm HTN.  . CAD (coronary artery disease)     a. Cath 02/2013: severe 2V CAD - 100% mRCA with L-R collaterals; diffuse 80-95% subtotal OM2 (too diffusely diseased to attempt PCA), overall small caliber diffusely diseased coronaries c/w poorly controlled DM; treated medically.  . CKD (chronic kidney disease), stage IV   . Ischemic cardiomyopathy   . Anemia     a. Prior normal EGD 03/2013. b. Progressive anemia 06/2013 in setting of transient hematemesis that had resolved.   . Thrombocytopenia     a. Liver imaging normal 03/2013.  . Edema      a. RUE edema with neg duplex for DVT 06/2013.  Marland Kitchen Protein calorie malnutrition   . Poor social situation   . Ascites     a. s/p paracentesis 03/2013.  . Valvular heart disease     a. Echo 06/2013: mild AI, mod MR, mod TR, mod PR.    Past Surgical History  Procedure Laterality Date  . Cesarean section    . Tubal ligation    . Esophagogastroduodenoscopy N/A 03/13/2013    Procedure: ESOPHAGOGASTRODUODENOSCOPY (EGD);  Surgeon: Winfield Cunas., MD;  Location: [facility] ENDOSCOPY;  Service: Endoscopy;  Laterality: N/A;  . Cardiac catheterization  02/2013    severe 2 vessel CAD    Arthur Holms, RD, LDN Pager #: [phone] After-Hours Pager #: [phone]

## 2014-01-07 DIAGNOSIS — G990 Autonomic neuropathy in diseases classified elsewhere: Secondary | ICD-10-CM

## 2014-01-07 DIAGNOSIS — R7881 Bacteremia: Secondary | ICD-10-CM | POA: Insufficient documentation

## 2014-01-07 DIAGNOSIS — E1143 Type 2 diabetes mellitus with diabetic autonomic (poly)neuropathy: Secondary | ICD-10-CM | POA: Insufficient documentation

## 2014-01-07 DIAGNOSIS — B965 Pseudomonas (aeruginosa) (mallei) (pseudomallei) as the cause of diseases classified elsewhere: Secondary | ICD-10-CM | POA: Insufficient documentation

## 2014-01-07 DIAGNOSIS — N390 Urinary tract infection, site not specified: Secondary | ICD-10-CM

## 2014-01-07 DIAGNOSIS — I5043 Acute on chronic combined systolic (congestive) and diastolic (congestive) heart failure: Secondary | ICD-10-CM | POA: Insufficient documentation

## 2014-01-07 LAB — URINE CULTURE: Colony Count: >=100000

## 2014-01-07 LAB — BASIC METABOLIC PANEL
ANION GAP: 17 — AB (ref 5–15)
BUN: 96 mg/dL — ABNORMAL HIGH (ref 6–23)
CHLORIDE: 94 meq/L — AB (ref 96–112)
CO2: 26 meq/L (ref 19–32)
Calcium: 8.2 mg/dL — ABNORMAL LOW (ref 8.4–10.5)
Creatinine, Ser: 2.94 mg/dL — ABNORMAL HIGH (ref 0.50–1.10)
GFR calc Af Amer: 18 mL/min — ABNORMAL LOW (ref >90)
GFR calc non Af Amer: 16 mL/min — ABNORMAL LOW (ref >90)
Glucose, Bld: 116 mg/dL — ABNORMAL HIGH (ref 70–99)
POTASSIUM: 3.7 meq/L (ref 3.7–5.3)
SODIUM: 137 meq/L (ref 137–147)

## 2014-01-07 LAB — CBC
HCT: 28.8 % — ABNORMAL LOW (ref 36.0–46.0)
Hemoglobin: 9.2 g/dL — ABNORMAL LOW (ref 12.0–15.0)
MCH: 30.8 pg (ref 26.0–34.0)
MCHC: 31.9 g/dL (ref 30.0–36.0)
MCV: 96.3 fL (ref 78.0–100.0)
PLATELETS: 79 10*3/uL — AB (ref 150–400)
RBC: 2.99 MIL/uL — AB (ref 3.87–5.11)
RDW: 19.2 % — ABNORMAL HIGH (ref 11.5–15.5)
WBC: 3.7 10*3/uL — AB (ref 4.0–10.5)

## 2014-01-07 LAB — GLUCOSE, CAPILLARY
GLUCOSE-CAPILLARY: 140 mg/dL — AB (ref 70–99)
GLUCOSE-CAPILLARY: 120 mg/dL — AB (ref 70–99)
GLUCOSE-CAPILLARY: 240 mg/dL — AB (ref 70–99)
GLUCOSE-CAPILLARY: 83 mg/dL (ref 70–99)
Glucose-Capillary: 124 mg/dL — ABNORMAL HIGH (ref 70–99)

## 2014-01-07 MED ORDER — HYDRALAZINE HCL 25 MG PO TABS
25.0000 mg | ORAL_TABLET | Freq: Three times a day (TID) | ORAL | Status: DC
  Administered 2014-01-07 – 2014-01-10 (×10): 25 mg via ORAL
  Filled 2014-01-07 (×12): qty 1

## 2014-01-07 NOTE — Progress Notes (Signed)
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 On admission, patient was found to be in acute heart failure with ascites-similar to past admissions. She was also noted to have a creatinine of 3.45 and was hypothermic requiring Customer service manager. Patient placed in stepdown unit and started on IV Lasix. Initially there was concern about sepsis and antibiotics started, discontinued the following morning as no immediate signs of infection noted  11/16 she had not been responding very well to Lasix alone. Seen by nephrology and cardiology. Creatinine progressively worsening. Through son, patient declined central line needed for IV milrinone but Cardiology was able to have PICC placed instead since she is NOT a candidate for HD. She has continued to have issues with hypothermia requiring Retail banker.  11/17 outpt notes from oncology reviewed. Prognosis documented as poor and NOT candidate for systemic treatment of high-grade myelodysplastic syndrome. Dr. Alvy Bimler recommended the option of Hospice but also felt the family at that time was not ready. She also recommended supportive care and to transfuse if Hgb </= 8.0.  Patient has had excellent diuretic response after milrinone initiated in combination with Lasix and metolazone. By 11/24 her creatinine began to rise so the Lasix and metolazone were discontinued. Discontinuation of milrinone accomplished on 11/26.   D/C was planned for 11/27, but had to be delayed as pt had tunneled L chest PICC and IR not available to  remove it.  D/C was rescheduled for 11/28, but pt then developed a fever after removal of her PICC. Blood and urine cultures were obtained at that time. Blood cultures have returned 2/2 positive for gram-negative rods and urine culture has returned with greater than 100,000 colonies of pseudomonas aeruginosa with sensitivities pending. Patient had been placed on Rocephin. By 11/30 Rocephin was discontinued in favor of Primaxin.  HPI/Subjective: Awake and more interactive today, eating better-no pain  Assessment/Plan:  Pseudomonas UTI  -Pan sensitive including Cipro-likely transition to Cipro once blood cultures results returned -cont Primaxin -Avoid Foley catheter-Foley catheter was discontinued on 11/25 and had been inserted on 11/15  2/2 Gram-negative rod bacteremia -2/2 positive and with documented urinary tract infection suspect source organism will be Pseudomonas -Antibiotics as outlined above -Remains hemodynamically stable and no signs of sepsis  Acute on chronic combined systolic and diastolic congestive heart failure / ICM / Ascites -Medication adjustments per Cardiology: Lasix and metolazone discontinued 11/25; milrinone continued through 11/26; transitioned to oral Demadex 11/26-held briefly due to worsening renal function but resumed again 11/30 -net neg ~24,000+ cc since admission -ascites from passive hepatic congestion; Interventional radiology performed therapeutic paracentesis with 1.1 L bloody fluid removed  Acute renal failure on CKD, stage IV with cardiorenal syndrome -Nephrology has seen - they signed off 11/18 -Cr has waxed and waned w/ adjustments in diuretic dosing  -crt had been climbing leading [place] to hold her demadex; current trend is down so cardiology has resumed at 40 mg daily as of 11/30 -NOT a candidate for HD -Palliative has evaluated this admission-CODE STATUS needs to be re-addressed after discharge  Essential hypertension -BP stable presently    Hyperlipidemia -statin was dc'd in setting of elevated LFTs  -  repeat 12/2-if improved consider resume statin  Type 2 diabetes mellitus with peripheral neuropathy -cont to follow CBGs w/o change today  -HgbA1c was 6.2 Sept 2015   Protein calorie malnutrition -Nutrition evaluated - intake improved after w/ food brought in from home   Anemia 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) -MDS driving sx anemia -per Onco OP notes rec is to transfuse for Hgb </= 8.0 -11/17 Hgb 7.0 so was transfused 2 units PRBCs each over 4 hours -current hgb stable and greater than 8.0 -severe MDS alone offers dire prognosis-Palliative Care has seen, but the family is not interested in hospice care, and desires to continue aggressive medical tx -NEED TO CONTINUE DISCUSSION IN OUTPATIENT SETTING  Exfoliative dermatitis -Not consistent with drug rash -Utilized Neosporin for excoriated wounds; lotions to intact skin with improvement in dermatitis symptoms -Added IV Ancef as prophylaxis to prevent diffuse cellulitis - completed 7 day course   DVT prophylaxis: SCDs Code Status: Full Family Communication: No family at bedside Disposition Plan/Expected LOS: Stepdown  Consultants: Cardiology/ Dr. Haroldine Laws Nephrology/Dr. Posey Pronto Palliative Care/Dr. Deitra Mayo  Procedures: Therapeutic paracentesis 11/16 IR R chest tunneled PICC 11/16 > 11/28  Antibiotics: Zosyn 11/14  Vancomycin 11/14  Ancef 11/19 > 11/25 Rocephin 11/28 > 11/29 Primaxin 11/30 >  Objective: Blood pressure 123/57, pulse 71, temperature 97.6 F (36.4 C), temperature source Oral, resp. rate 13, height 5\' 4"  (1.626 m), weight 111 lb 12.4 oz (50.7 kg), SpO2 100 %.  Intake/Output Summary (Last 24 hours) at 01/07/14 1112 Last data filed at 01/07/14 0945  Gross per 24 hour  Intake    900 ml  Output      0 ml  Net    900 ml   Exam: Gen: No acute respiratory distress -alert and no complaints Chest: Clear to auscultation bilaterally  w/o wheeze  Cardiac: Regular rate and rhythm, no peripheral edema Abdomen: Soft nontender nondistended without obvious hepatosplenomegaly, no ascites Extremities: without cyanosis or clubbing, previous exfoliative dermatitis skin changes improving   Scheduled Meds:  Scheduled Meds: . antiseptic oral rinse  7 mL Mouth Rinse BID  . aspirin EC  81 mg Oral Daily  . feeding supplement (GLUCERNA SHAKE)  237 mL Oral Daily  . hydrALAZINE  25 mg Oral 3 times per day  . hydrocerin   Topical BID  . imipenem-cilastatin  250 mg Intravenous Q12H  . insulin aspart  0-15 Units Subcutaneous TID WC  . insulin aspart  0-5 Units Subcutaneous QHS  . isosorbide mononitrate  30 mg Oral Daily  . lubriderm seriously sensitive   Topical BID  . mirtazapine  15 mg Oral QHS  . neomycin-bacitracin-polymyxin   Topical BID  . torsemide  40 mg Oral Daily    Data Reviewed: Basic Metabolic Panel:  Recent Labs Lab 01/03/14 0500 01/04/14 0510 01/05/14 0220 01/06/14 0232 01/07/14 0316  NA 137 136* 135* 135* 137  K 3.5* 3.2* 4.1 4.2 3.7  CL 92* 91* 92* 92* 94*  CO2 30 29 28 28 26   GLUCOSE 92 84 172* 151* 116*  BUN 89* 88* 95* 93* 96*  CREATININE 2.84* 3.04* 3.28* 3.14* 2.94*  CALCIUM 8.3* 8.4 8.2* 8.2* 8.2*  MG  --   --  2.2  --   --   PHOS  --   --  4.1  --   --    Liver Function Tests:  Recent Labs Lab 01/05/14 0220  ALBUMIN 2.2*   CBC:  Recent Labs Lab 01/03/14 0500 01/04/14 0510  01/05/14 0220 01/06/14 0232 01/07/14 0316  WBC 6.3 6.1 4.2 4.2 3.7*  HGB 8.4* 9.2* 8.5* 8.6* 9.2*  HCT 26.3* 28.0* 26.7* 26.6* 28.8*  MCV 96.7 99.3 96.7 96.0 96.3  PLT 77* 82* 85* 87* 79*   BNP (last 3 results)  Recent Labs  10/27/13 1606 10/30/13 0531 12/21/13 2041  PROBNP 16497.0* 12300.0* 15092.0*   CBG:  Recent Labs Lab 01/06/14 1225 01/06/14 1644 01/06/14 1930 01/06/14 2205 01/07/14 0800  GLUCAP 111* 155* 156* 140* 120*    Recent Results (from the past 240 hour(s))  Culture, blood  (routine x 2)     Status: None (Preliminary result)   Collection Time: 01/04/14 12:10 PM  Result Value Ref Range Status   Specimen Description BLOOD LEFT HAND  Final   Special Requests BOTTLES DRAWN AEROBIC AND ANAEROBIC 10CC EACH  Final   Culture  Setup Time   Final    01/04/2014 21:18 Performed at [place]   Final    [place] Note: Gram Stain Report Called to,Read Back By and Verified With: CHRISTY JOHNSON 01/06/14 1110 BY SMITHERSJ Performed at [facility]    Report Status PENDING  Incomplete  Culture, blood (routine x 2)     Status: None (Preliminary result)   Collection Time: 01/04/14 12:20 PM  Result Value Ref Range Status   Specimen Description BLOOD RIGHT HAND  Final   Special Requests BOTTLES DRAWN AEROBIC AND ANAEROBIC 10CC EACH  Final   Culture  Setup Time   Final    01/04/2014 21:18 Performed at [facility]    Culture   Final    GRAM NEGATIVE RODS Note: Culture results may be compromised due to an excessive volume of blood received in culture bottles. Gram Stain Report Called to,Read Back By and Verified With: Vedia Coffer RN on 01/06/14 at 06:15 by Rise Mu Performed at [facility]    Report Status PENDING  Incomplete  Culture, Urine     Status: None (Preliminary result)   Collection Time: 01/04/14 10:44 PM  Result Value Ref Range Status   Specimen Description URINE, CLEAN CATCH  Final   Special Requests NONE  Final   Culture  Setup Time   Final    01/05/2014 15:25 Performed at [place]   Final    >=100,000 COLONIES/ML Performed at [facility]    Culture   Final    PSEUDOMONAS AERUGINOSA Performed at [facility]    Report Status PENDING  Incomplete     Studies:  Recent x-ray studies have been reviewed in detail by the Attending Physician  Time spent :  35 mins  Erin Hearing, ANP [facility]  - [phone] Office  [phone] Pager [phone]  On-Call/Text Page:      [email or web address]      password [facility]  01/07/2014, 11:12 AM   LOS: 17 days   examined patient and discussed assessment and plan with ANP Ebony Hail and agree with above. Patient with multiple complex medical problems> 35 minutes spent in direct patient care

## 2014-01-07 NOTE — Progress Notes (Signed)
Patient ID: [name] [name], female   DOB: [birth year], 62 y.o.   MRN: [id number] Advanced Heart Failure Rounding Note   Subjective:    63 yo with history of CAD and systolic HF due to ischemic cardiomyopathy with EF 30-35% and pulmonary venous hypertension, myelodysplastic syndrome, GI bleed, and CKD . Admitted with volume overload and acute on chronic renal failure.   Was scheduled for d/c 11/28 but became febrile when PICC was pulled so kept in house. BCx drawn 2/2 GNR.  Ucx = pseudomonas.    Afebrile overnight.  Denies dyspnea. Wants to go home.   Low-dose demadex restarted yesterday. Cr improving. 2.8 -> 3.0 -> 3..28 -> 3.14-> 2.94  Weight near baseline.   Marland Kitchen antiseptic oral rinse  7 mL Mouth Rinse BID  . aspirin EC  81 mg Oral Daily  . feeding supplement (GLUCERNA SHAKE)  237 mL Oral Daily  . hydrALAZINE  12.5 mg Oral 3 times per day  . hydrocerin   Topical BID  . imipenem-cilastatin  250 mg Intravenous Q12H  . insulin aspart  0-15 Units Subcutaneous TID WC  . insulin aspart  0-5 Units Subcutaneous QHS  . isosorbide mononitrate  30 mg Oral Daily  . lubriderm seriously sensitive   Topical BID  . mirtazapine  15 mg Oral QHS  . neomycin-bacitracin-polymyxin   Topical BID  . torsemide  40 mg Oral Daily    Objective:   Weight Range:  Vital Signs:   Temp:  [97.6 F (36.4 C)-98.2 F (36.8 C)] 97.6 F (36.4 C) (12/01 0801) Pulse Rate:  [69-75] 71 (12/01 0801) Resp:  [12-18] 13 (12/01 0801) BP: (112-142)/(56-71) 123/57 mmHg (12/01 0801) SpO2:  [100 %] 100 % (12/01 0801) Weight:  [50.7 kg (111 lb 12.4 oz)] 50.7 kg (111 lb 12.4 oz) (12/01 0400) Last BM Date: 01/06/14  Weight change: Filed Weights   01/05/14 0405 01/06/14 0407 01/07/14 0400  Weight: 52.9 kg (116 lb 10 oz) 50.1 kg (110 lb 7.2 oz) 50.7 kg (111 lb 12.4 oz)    Intake/Output:   Intake/Output Summary (Last 24 hours) at 01/07/14 7846 Last data filed at 01/06/14 2322  Gross per 24 hour  Intake    800 ml  Output      0  ml  Net    800 ml     Physical Exam: General:  Chronically ill appearing. No resp difficulty HEENT: normal diffuse exfoliative rash Neck: supple. JVP jaw Carotids 2+ bilat; no bruits. No lymphadenopathy or thryomegaly appreciated. Cor: PMI nondisplaced. Regular rate & rhythm. No rubs,  or murmurs. + S3  R chest power PICC Lungs: clear Abdomen: soft, nontender, +distended. No hepatosplenomegaly. No bruits or masses. Good bowel sounds. Extremities: no cyanosis, clubbing, rash trace tr leg edema  RUE infiltrate Neuro: alert & orientedx3, cranial nerves grossly intact. moves all 4 extremities w/o difficulty. Affect pleasant Skin: Dry scales all over her body.   Telemetry: SR   Labs: Basic Metabolic Panel:  Recent Labs Lab 01/03/14 0500 01/04/14 0510 01/05/14 0220 01/06/14 0232 01/07/14 0316  NA 137 136* 135* 135* 137  K 3.5* 3.2* 4.1 4.2 3.7  CL 92* 91* 92* 92* 94*  CO2 30 29 28 28 26   GLUCOSE 92 84 172* 151* 116*  BUN 89* 88* 95* 93* 96*  CREATININE 2.84* 3.04* 3.28* 3.14* 2.94*  CALCIUM 8.3* 8.4 8.2* 8.2* 8.2*  MG  --   --  2.2  --   --   PHOS  --   --  4.1  --   --     Liver Function Tests:  Recent Labs Lab 01/05/14 0220  ALBUMIN 2.2*   No results for input(s): LIPASE, AMYLASE in the last 168 hours. No results for input(s): AMMONIA in the last 168 hours.  CBC:  Recent Labs Lab 01/03/14 0500 01/04/14 0510 01/05/14 0220 01/06/14 0232 01/07/14 0316  WBC 6.3 6.1 4.2 4.2 3.7*  HGB 8.4* 9.2* 8.5* 8.6* 9.2*  HCT 26.3* 28.0* 26.7* 26.6* 28.8*  MCV 96.7 99.3 96.7 96.0 96.3  PLT 77* 82* 85* 87* 79*    Cardiac Enzymes: No results for input(s): CKTOTAL, CKMB, CKMBINDEX, TROPONINI in the last 168 hours.  BNP: BNP (last 3 results)  Recent Labs  10/27/13 1606 10/30/13 0531 12/21/13 2041  PROBNP 16497.0* 12300.0* 15092.0*     Other results:    Imaging: No results found.   Medications:     Scheduled Medications: . antiseptic oral rinse  7 mL  Mouth Rinse BID  . aspirin EC  81 mg Oral Daily  . feeding supplement (GLUCERNA SHAKE)  237 mL Oral Daily  . hydrALAZINE  12.5 mg Oral 3 times per day  . hydrocerin   Topical BID  . imipenem-cilastatin  250 mg Intravenous Q12H  . insulin aspart  0-15 Units Subcutaneous TID WC  . insulin aspart  0-5 Units Subcutaneous QHS  . isosorbide mononitrate  30 mg Oral Daily  . lubriderm seriously sensitive   Topical BID  . mirtazapine  15 mg Oral QHS  . neomycin-bacitracin-polymyxin   Topical BID  . torsemide  40 mg Oral Daily    Infusions:    PRN Medications: acetaminophen **OR** acetaminophen, camphor-menthol, hydrOXYzine, ondansetron **OR** ondansetron (ZOFRAN) IV, oxyCODONE, zolpidem   Assessment:   1. A/C Systolic Heart Failure. Ischemic cardiomyopathy, EF 30-35% with cardiorenal syndrome 2. Acute on chronic renal failure Stage IV: Not a good HD candidate.  3. DM2 4. H/O Gout 5. H/O GI bleed 10/2013  6. Diffuse scaly rash 7. MDS with pancytopenia 8. Fever - possible line infection. BCx 2/2 GNR 9. Pseudomonal UTI   Plan/Discussion:    Tolerating low-dose demadex. Renal function stable. Volume status looks stable. Will increase hydralazine to 25 q8. Not candidate for ACE or b-blocker.  Psuedomonas UTI and bacteremia per [facility].   Not much more to offer. Prognosis overall very poor.   Daniel Bensimhon,MD 9:18 AM

## 2014-01-08 ENCOUNTER — Other Ambulatory Visit: Payer: Self-pay

## 2014-01-08 ENCOUNTER — Ambulatory Visit: Payer: Self-pay | Admitting: Hematology and Oncology

## 2014-01-08 DIAGNOSIS — A4150 Gram-negative sepsis, unspecified: Secondary | ICD-10-CM

## 2014-01-08 DIAGNOSIS — A499 Bacterial infection, unspecified: Secondary | ICD-10-CM

## 2014-01-08 DIAGNOSIS — T68XXXA Hypothermia, initial encounter: Secondary | ICD-10-CM | POA: Insufficient documentation

## 2014-01-08 LAB — COMPREHENSIVE METABOLIC PANEL
ALT: 6 U/L (ref 0–35)
AST: 30 U/L (ref 0–37)
Albumin: 2.3 g/dL — ABNORMAL LOW (ref 3.5–5.2)
Alkaline Phosphatase: 149 U/L — ABNORMAL HIGH (ref 39–117)
Anion gap: 12 (ref 5–15)
BUN: 89 mg/dL — ABNORMAL HIGH (ref 6–23)
CO2: 28 meq/L (ref 19–32)
Calcium: 8.4 mg/dL (ref 8.4–10.5)
Chloride: 98 mEq/L (ref 96–112)
Creatinine, Ser: 2.72 mg/dL — ABNORMAL HIGH (ref 0.50–1.10)
GFR calc Af Amer: 20 mL/min — ABNORMAL LOW (ref >90)
GFR, EST NON AFRICAN AMERICAN: 17 mL/min — AB (ref >90)
Glucose, Bld: 82 mg/dL (ref 70–99)
Potassium: 3.8 mEq/L (ref 3.7–5.3)
Sodium: 138 mEq/L (ref 137–147)
Total Bilirubin: 0.8 mg/dL (ref 0.3–1.2)
Total Protein: 7.1 g/dL (ref 6.0–8.3)

## 2014-01-08 LAB — CULTURE, BLOOD (ROUTINE X 2)

## 2014-01-08 LAB — CBC
HCT: 28.8 % — ABNORMAL LOW (ref 36.0–46.0)
Hemoglobin: 8.9 g/dL — ABNORMAL LOW (ref 12.0–15.0)
MCH: 30 pg (ref 26.0–34.0)
MCHC: 30.9 g/dL (ref 30.0–36.0)
MCV: 97 fL (ref 78.0–100.0)
Platelets: 90 10*3/uL — ABNORMAL LOW (ref 150–400)
RBC: 2.97 MIL/uL — ABNORMAL LOW (ref 3.87–5.11)
RDW: 19.4 % — ABNORMAL HIGH (ref 11.5–15.5)
WBC: 3.4 10*3/uL — ABNORMAL LOW (ref 4.0–10.5)

## 2014-01-08 LAB — GLUCOSE, CAPILLARY
GLUCOSE-CAPILLARY: 77 mg/dL (ref 70–99)
GLUCOSE-CAPILLARY: 140 mg/dL — AB (ref 70–99)
GLUCOSE-CAPILLARY: 207 mg/dL — AB (ref 70–99)
Glucose-Capillary: 192 mg/dL — ABNORMAL HIGH (ref 70–99)

## 2014-01-08 MED ORDER — DEXTROSE 5 % IV SOLN
1.0000 g | Freq: Every day | INTRAVENOUS | Status: DC
  Filled 2014-01-08: qty 1

## 2014-01-08 MED ORDER — DEXTROSE 5 % IV SOLN
1.0000 g | INTRAVENOUS | Status: DC
  Administered 2014-01-08 – 2014-01-09 (×2): 1 g via INTRAVENOUS
  Filled 2014-01-08 (×3): qty 1

## 2014-01-08 MED ORDER — ATORVASTATIN CALCIUM 10 MG PO TABS
10.0000 mg | ORAL_TABLET | Freq: Every day | ORAL | Status: DC
  Administered 2014-01-08 – 2014-01-09 (×2): 10 mg via ORAL
  Filled 2014-01-08 (×3): qty 1

## 2014-01-08 NOTE — Progress Notes (Signed)
Patient transferred to [place] from 2 central.  Patient had no complaints of pain and vital signs were stable.  Will continue to monitor.

## 2014-01-08 NOTE — Progress Notes (Signed)
Physical Therapy Treatment Patient Details Name: [name] [name] MRN: [id number] DOB: Jun 26, 1950 Today's Date: 01/08/2014    History of Present Illness      PT Comments    Pt progressing well with mobility.  She ambulated 350 feet with RW and supervision.  Continue with POC. Anticipate d/c home soon.  Follow Up Recommendations  No PT follow up;Supervision for mobility/OOB     Equipment Recommendations  Rolling walker with 5" wheels    Recommendations for Other Services       Precautions / Restrictions Precautions Precautions: Fall    Mobility  Bed Mobility     Rolling: Modified independent (Device/Increase time) Sidelying to sit: Modified independent (Device/Increase time) Supine to sit: Modified independent (Device/Increase time)        Transfers   Equipment used: Rolling walker (2 wheeled)   Sit to Stand: Supervision Stand pivot transfers: Supervision          Ambulation/Gait Ambulation/Gait assistance: Supervision Ambulation Distance (Feet): 350 Feet Assistive device: Rolling walker (2 wheeled) Gait Pattern/deviations: Step-through pattern;Decreased stride length Gait velocity: WFL Gait velocity interpretation: Below normal speed for age/gender     Stairs            Wheelchair Mobility    Modified Rankin (Stroke Patients Only)       Balance                                    Cognition Arousal/Alertness: Awake/alert Behavior During Therapy: WFL for tasks assessed/performed Overall Cognitive Status: Difficult to assess                      Exercises      General Comments        Pertinent Vitals/Pain Pain Assessment: No/denies pain    Home Living                      Prior Function            PT Goals (current goals can now be found in the care plan section) Progress towards PT goals: Progressing toward goals    Frequency  Min 3X/week    PT Plan Current plan remains appropriate     Co-evaluation             End of Session Equipment Utilized During Treatment: Gait belt Activity Tolerance: Patient tolerated treatment well Patient left: in chair;with call bell/phone within reach     Time: 0910-0934 PT Time Calculation (min) (ACUTE ONLY): 24 min  Charges:  $Gait Training: 23-37 mins                    G Codes:      Lorriane Shire 01/08/2014, 9:46 AM

## 2014-01-08 NOTE — Progress Notes (Signed)
[facility]                                                                              Patient Demographics  [name] [name], is a 63 y.o. female, DOB - 1951/02/01, GYF:[id number]  Admit date - 12/21/2013   Admitting Physician Merton Border, MD  Outpatient Primary MD for the patient is Angelica Chessman, MD  LOS - 18   Chief Complaint  Patient presents with  . Shortness of Breath      HPI on 12/21/2013 by Dr. Boyce Medici [name] [name] is a 63 y.o. female, With the past medical history significant for coronary artery disease, congestive heart failure with echo done on 10/16/2013 showing ejection fraction of 30-35% and ascites status post paracentesis on 03/2013 presenting with increasing shortness of breath for the last few days with decreased urinary output. Patient is on torsemide twice daily and reports being compliant with her medications. Most of the history was taken from the son who reports that his mother complains of increaseditching as well theophylline patient was recently diagnosed with rapidly progressing MDS and is being followed at the oncology clinicwith frequent blood transfusions. Her physician at that time considered placing her on hospice however the family and the patient were not ready. No history of chest pains, palpitations, nausea or vomiting. No history of diarrhea. The son reports that she has a chronic rash and always complained of itching which has been worse in the last few days.patient also has history of chronic kidney disease stage IV but due to her comorbidities, dialysis has not been discussed.the son also reports the onset of chills today but no feverishness. In the emergency room the patient was found to be hypothermic and the bear hugger was placed.  Interim history Patient was started on Lasix however did not tolerate Lasix alone. Nephrology and cardiology both consulted. Creatinine was progressively worsening. Patient declined central line placement for IV  milrinone cardiology was able to have PICC line placed however patient is not a candidate for HD. Patient did have some hypothermia are clear. Oncology was consulted, prognosis was documented as poor as patient was not a candidate for systemic treatment of high-grade myelodysplastic syndrome. Dr. Alvy Bimler recommended hospice however family was not ready. Cardiology started patient on milrinone as well as Lasix and metolazone, patient had excellent diuretic response. Follow 24 2015, her creatinine began to rise so Lasix and metolazone were discontinued. No neuronal was also discontinued on 1126. Patient unfortunately developed 2 out of 2 blood cultures for gram-negative rods on 01/04/2014, therefore her PICC line was discontinued. Patient also was found to have 100,000 colonies of Pseudomonas aeruginosa in her urine culture. Patient was placed on Primaxin.  Assessment & Plan   Pseudomonas urinary tract infection -Pansensitive, currently on Primaxin but will discontinue as patient's blood culture sensitivities have returned -Avoiding reinsertion of Foley catheterization was discontinued on 12/12/2013  Entertobacter Cloacae/ Gram-negative rod bacteremia -Sensitive to ceftazidime, will start 1g daily (due to renal insufficiency) -Spoke with Dr. 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  Haroldine Laws -Continue Demadex 40 mg daily  Acute renal failure with EKG, stage IV, cardiorenal syndrome -Nephrology was consulted and appreciated, signed off on 11/18 -Creatinine has waxed and waned with adjustments in diuretic dosing -Patient is not a candidate for hemodialysis -Palliative care was consulted however patient's CODE STATUS was not changed and will need to be very addressed -Will continue to monitor creatinine, appears to be trending downward  Essential hypertension -Currently stable, continue hydralazine and diuretics  Hyperlipidemia -Statin was held due to elevated LFTs -AST and ALT trending downward and normalized -Will restart statin  Diabetes mellitus, type II with peripheral neuropathy -Continue to monitor CBGs and insulin sliding scale -Hemoglobin A1c 6.2 in September 2015  Protein calorie malnutrition -Nutrition consulted -Patient's nutritional status improved after she began intaking food from home  Anemia of chronic disease/thrombocytopenia and MDS (high-grade) -Per oncology outpatient notes, transfuse if hemoglobin less than 8 -On 12/24/2013, hemoglobin of 7, she was given 2 units packed red blood cells -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 and has poor prognosis, palliative care was seen however patient's family was not interested in hospice care -Hb 8.9 -Platelets improving -Continue to monitor CBC  Exfoliative dermatitis -Not consistent with drug rash -Continue Neosporin for excoriated wounds, lotions to intact skin -Patient completed a seven-day course of IV Ancef for prophylaxis of diffuse cellulitis  Code Status: Full  Family Communication: None at bedside  Disposition Plan: Admitted, anticipate discharge within the next 24-48 hours  Time Spent in minutes   30 minutes  Procedures  Therapeutic paracentesis on 12/23/2013 by interventional radiology Right chest tunneled PICC line placed 11/16 and discontinued 01/04/2014  Consults   Cardiology,  [facility], Dr. Posey Pronto Palliative care, Dr. Deitra Mayo Infectious disease, Dr. Johnnye Sima, via phone  DVT Prophylaxis  SCDs  Lab Results  Component Value Date   PLT 90* 01/08/2014    Medications  Scheduled Meds: . antiseptic oral rinse  7 mL Mouth Rinse BID  . aspirin EC  81 mg Oral Daily  . feeding supplement (GLUCERNA SHAKE)  237 mL Oral Daily  . hydrALAZINE  25 mg Oral 3 times per day  . hydrocerin   Topical BID  . imipenem-cilastatin  250 mg Intravenous Q12H  . insulin aspart  0-15 Units Subcutaneous TID WC  . insulin aspart  0-5 Units Subcutaneous QHS  . isosorbide mononitrate  30 mg Oral Daily  . lubriderm seriously sensitive   Topical BID  . mirtazapine  15 mg Oral QHS  . neomycin-bacitracin-polymyxin   Topical BID  . torsemide  40 mg Oral Daily   Continuous Infusions:  PRN Meds:.acetaminophen **OR** acetaminophen, camphor-menthol, hydrOXYzine, ondansetron **OR** ondansetron (ZOFRAN) IV, oxyCODONE, zolpidem  Antibiotics    Anti-infectives    Start     Dose/Rate Route Frequency Ordered Stop   01/06/14 1200  imipenem-cilastatin (PRIMAXIN) 250 mg in sodium chloride 0.9 % 100 mL IVPB     250 mg200 mL/hr over 30 Minutes Intravenous Every 12 hours 01/06/14 1121     01/04/14 2330  cefTRIAXone (ROCEPHIN) 1 g in dextrose 5 % 50 mL IVPB - Premix  Status:  Discontinued     1  g100 mL/hr over 30 Minutes Intravenous Every 24 hours 01/04/14 2319 01/06/14 1117   12/26/13 2200  ceFAZolin (ANCEF) IVPB 1 g/50 mL premix  Status:  Discontinued     1 g100 mL/hr over 30 Minutes Intravenous Every 12 hours 12/26/13 1046 01/01/14 1334   12/26/13 0800  ceFAZolin (ANCEF) IVPB 1 g/50 mL premix  Status:  Discontinued     1 g100 mL/hr over 30 Minutes Intravenous 3 times per day 12/26/13 0713 12/26/13 1046   12/23/13 2200  vancomycin (VANCOCIN) 500 mg in sodium chloride 0.9 % 100 mL IVPB  Status:  Discontinued     500 mg100 mL/hr over 60 Minutes Intravenous Every 48 hours 12/21/13 2146  12/22/13 1412   12/22/13 0600  piperacillin-tazobactam (ZOSYN) IVPB 2.25 g  Status:  Discontinued     2.25 g100 mL/hr over 30 Minutes Intravenous 3 times per day 12/21/13 2146 12/22/13 1412   12/21/13 2115  piperacillin-tazobactam (ZOSYN) IVPB 3.375 g     3.375 g100 mL/hr over 30 Minutes Intravenous  Once 12/21/13 2114 12/21/13 2205   12/21/13 2115  vancomycin (VANCOCIN) IVPB 1000 mg/200 mL premix     1,000 mg200 mL/hr over 60 Minutes Intravenous  Once 12/21/13 2114 12/21/13 2301        Subjective:   [name] [name] seen and examined today. Patient states she feels bad all over. Cannot elaborate on this. Patient denies any chest pain, shortness of breath or any specific type of pain at this time.  Objective:   Filed Vitals:   01/07/14 2121 01/08/14 0047 01/08/14 0500 01/08/14 0553  BP: 157/61 125/55  141/61  Pulse:  92  71  Temp:  97.9 F (36.6 C)  97.9 F (36.6 C)  TempSrc:  Oral  Oral  Resp:  16  17  Height:  5\' 1"  (1.549 m)    Weight:  48.807 kg (107 lb 9.6 oz) 48.807 kg (107 lb 9.6 oz)   SpO2:  99%  100%    Wt Readings from Last 3 Encounters:  01/08/14 48.807 kg (107 lb 9.6 oz)  11/22/13 49.125 kg (108 lb 4.8 oz)  11/12/13 48.807 kg (107 lb 9.6 oz)     Intake/Output Summary (Last 24 hours) at 01/08/14 9381 Last data filed at 01/08/14 0827  Gross per 24 hour  Intake    580 ml  Output    575 ml  Net      5 ml    Exam  General: Well developed, well nourished, NAD, appears stated age  HEENT: NCAT, mucous membranes moist.   Cardiovascular: S1 S2 auscultated, no rubs, murmurs or gallops. Regular rate and rhythm.  Respiratory: Clear to auscultation bilaterally with equal chest rise  Abdomen: Soft, nontender, nondistended, + bowel sounds  Extremities: warm dry without cyanosis clubbing or edema  Neuro: Awake and alert, no focal deficits  Skin: Dry, scaly, flaky  Data Review   Micro Results Recent Results (from the past 240 hour(s))  Culture, blood (routine x  2)     Status: None   Collection Time: 01/04/14 12:10 PM  Result Value Ref Range Status   Specimen Description BLOOD LEFT HAND  Final   Special Requests BOTTLES DRAWN AEROBIC AND ANAEROBIC 10CC EACH  Final   Culture  Setup Time   Final    01/04/2014 21:18 Performed at [facility]    Culture   Final    ENTEROBACTER CLOACAE Note: Gram Stain Report Called to,Read Back By and Verified With: CHRISTY JOHNSON 01/06/14 1110  BY SMITHERSJ Performed at [facility]    Report Status 01/08/2014 FINAL  Final   Organism ID, Bacteria ENTEROBACTER CLOACAE  Final      Susceptibility   Enterobacter cloacae - MIC*    CEFAZOLIN >=64 RESISTANT Resistant     CEFEPIME <=1 SENSITIVE Sensitive     CEFTAZIDIME <=1 SENSITIVE Sensitive     CEFTRIAXONE <=1 SENSITIVE Sensitive     CIPROFLOXACIN <=0.25 SENSITIVE Sensitive     GENTAMICIN <=1 SENSITIVE Sensitive     IMIPENEM 0.5 SENSITIVE Sensitive     PIP/TAZO 8 SENSITIVE Sensitive     TOBRAMYCIN <=1 SENSITIVE Sensitive     TRIMETH/SULFA >=320 RESISTANT Resistant     * ENTEROBACTER CLOACAE  Culture, blood (routine x 2)     Status: None   Collection Time: 01/04/14 12:20 PM  Result Value Ref Range Status   Specimen Description BLOOD RIGHT HAND  Final   Special Requests BOTTLES DRAWN AEROBIC AND ANAEROBIC 10CC EACH  Final   Culture  Setup Time   Final    01/04/2014 21:18 Performed at [facility]    Culture   Final    ENTEROBACTER CLOACAE Note: SUSCEPTIBILITIES PERFORMED ON PREVIOUS CULTURE WITHIN THE LAST 5 DAYS. Note: Culture results may be compromised due to an excessive volume of blood received in culture bottles. Gram Stain Report Called to,Read Back By and Verified With: Vedia Coffer RN on 01/06/14 at 06:15 by Rise Mu Performed at [facility]    Report Status 01/08/2014 FINAL  Final  Culture, Urine     Status: None   Collection Time: 01/04/14 10:44 PM  Result Value Ref Range Status   Specimen Description  URINE, CLEAN CATCH  Final   Special Requests NONE  Final   Culture  Setup Time   Final    01/05/2014 15:25 Performed at [facility]   Final    >=100,000 COLONIES/ML Performed at [facility]    Culture   Final    PSEUDOMONAS AERUGINOSA Performed at [facility]    Report Status 01/07/2014 FINAL  Final   Organism ID, Bacteria PSEUDOMONAS AERUGINOSA  Final      Susceptibility   Pseudomonas aeruginosa - MIC*    CEFEPIME <=1 SENSITIVE Sensitive     CEFTAZIDIME 2 SENSITIVE Sensitive     CIPROFLOXACIN <=0.25 SENSITIVE Sensitive     GENTAMICIN <=1 SENSITIVE Sensitive     IMIPENEM 2 SENSITIVE Sensitive     PIP/TAZO <=4 SENSITIVE Sensitive     TOBRAMYCIN <=1 SENSITIVE Sensitive     * PSEUDOMONAS AERUGINOSA  Culture, blood (routine x 2)     Status: None (Preliminary result)   Collection Time: 01/07/14  2:28 PM  Result Value Ref Range Status   Specimen Description BLOOD RIGHT HAND  Final   Special Requests BOTTLES DRAWN AEROBIC AND ANAEROBIC 5CC  Final   Culture  Setup Time   Final    01/07/2014 21:21 Performed at [facility]    Culture   Final           BLOOD CULTURE RECEIVED NO GROWTH TO DATE CULTURE WILL BE HELD FOR 5 DAYS BEFORE ISSUING A FINAL NEGATIVE REPORT Performed at [facility]    Report Status PENDING  Incomplete  Culture, blood (routine x 2)     Status: None (Preliminary result)   Collection Time: 01/07/14  2:37 PM  Result Value Ref Range Status   Specimen Description BLOOD LEFT  HAND  Final   Special Requests BOTTLES DRAWN AEROBIC AND ANAEROBIC 5CC  Final   Culture  Setup Time   Final    01/07/2014 21:21 Performed at [facility]    Culture   Final           BLOOD CULTURE RECEIVED NO GROWTH TO DATE CULTURE WILL BE HELD FOR 5 DAYS BEFORE ISSUING A FINAL NEGATIVE REPORT Performed at [facility]    Report Status PENDING  Incomplete    Radiology Reports US Renal  12/23/2013   CLINICAL  DATA:  63 year old female with acute renal injury. Initial encounter.  EXAM: RENAL/URINARY TRACT ULTRASOUND COMPLETE  COMPARISON:  Abdomen MRI 03/12/2013 and earlier.  FINDINGS: Right Kidney:  Length: 7.9 cm.  Echogenic cortex.  No hydronephrosis or renal mass.  Left Kidney:  Length: 10.6 cm. No hydronephrosis. Cortical echogenicity not as pronounced as that on the right. Small simple appearing cortical cysts measure up to 12 mm and are stable.  Bladder:  Not visualized, reportedly a Foley catheter is in place.  Other findings: Small volume ascites is new. Right pleural effusion, which was on prior studies.  IMPRESSION: 1. Chronic medical renal disease perhaps greater on the right. No acute renal findings. 2. New small volume ascites.  Chronic right pleural effusion.   Electronically Signed   By: Lars Pinks M.D.   On: 12/23/2013 08:55   US Paracentesis  12/23/2013   CLINICAL DATA:  Abdominal distention, congestive heart failure, renal insufficiency. Request therapeutic paracentesis.  EXAM: ULTRASOUND GUIDED PARACENTESIS  COMPARISON:  None.  PROCEDURE: An ultrasound guided paracentesis was thoroughly discussed with the patient and questions answered. The benefits, risks, alternatives and complications were also discussed. The patient understands and wishes to proceed with the procedure. Written consent was obtained.  Ultrasound was performed to localize and mark an adequate pocket of fluid in the right lower quadrant of the abdomen. The area was then prepped and draped in the normal sterile fashion. 1% Lidocaine was used for local anesthesia. Under ultrasound guidance a 19 gauge Yueh catheter was introduced. Paracentesis was performed. The catheter was removed and a dressing applied.  COMPLICATIONS: None immediate  FINDINGS: A total of approximately 1.1 L of bloody ascitic fluid was removed. A fluid sample was not sent for laboratory analysis.  IMPRESSION: Successful ultrasound guided paracentesis yielding 1.1 L  of ascites.  Read by: Ascencion Dike PA-C   Electronically Signed   By: Markus Daft M.D.   On: 12/23/2013 15:59   Ir Fluoro Guide Cv Line Right  12/27/2013   CLINICAL DATA:  History of CHF, cardiomyopathy and stage 4 chronic kidney disease. The patient requires central venous access. Request has been made to place a tunneled jugular line due to need to preserve arm veins for potential future dialysis access.  EXAM: TUNNELED CENTRAL VENOUS CATHETER PLACEMENT WITH ULTRASOUND AND FLUOROSCOPIC GUIDANCE  ANESTHESIA/SEDATION: None  MEDICATIONS: None  FLUOROSCOPY TIME:  6 seconds.  PROCEDURE: The procedure, risks, benefits, and alternatives were explained to the patient. Questions regarding the procedure were encouraged and answered. The patient understands and consents to the procedure.  The right neck and chest were prepped with chlorhexidine in a sterile fashion, and a sterile drape was applied covering the operative field. Maximum barrier sterile technique with sterile gowns and gloves were used for the procedure. Local anesthesia was provided with 1% lidocaine.  After creating a small venotomy incision, a 21 gauge needle was advanced into the right internal jugular vein  under direct, real-time ultrasound guidance. Ultrasound image documentation was performed. After securing guidewire access, an 8 Fr dilator was placed. A wire was kinked to measure appropriate catheter length.  A 6 French, dual-lumen power line tunneled central venous catheter was tunneled in a retrograde fashion from the chest wall to the venotomy incision.  At the venotomy, serial dilatation was performed and a 6 Fr peel-away sheath was placed over a guidewire. The catheter was cut to 20 cm. The catheter was then placed through the sheath and the sheath removed. Final catheter positioning was confirmed and documented with a fluoroscopic spot image. The catheter was aspirated and flushed with saline.  The venotomy incision was closed with  subcutaneous 4-0 Vicryl. Dermabond was applied to the incision. The catheter exit site was secured with 0-Prolene retention sutures.  COMPLICATIONS: None.  No pneumothorax.  FINDINGS: After catheter placement, the tip lies at the cavoatrial junction. The catheter aspirates normally and is ready for immediate use.  IMPRESSION: Placement of tunneled hemodialysis catheter via the right internal jugular vein. The catheter tip lies at the cavoatrial junction. The catheter is ready for immediate use.   Electronically Signed   By: Aletta Edouard M.D.   On: 12/27/2013 16:52   Ir Removal Tun Cv Cath W/o Fl  01/04/2014   CLINICAL DATA:  Tunneled PICC catheter, fever, chills  EXAM: TUNNELED RIGHT IJ CATHETER REMOVAL:  TECHNIQUE: Overlying skin prepped with chlorhexidine, draped in usual sterile fashion . The previously placed tunneled right IJ catheter was dissected free from the underlying soft tissues and removed intact. Hemostasis was achieved. Site covered with a sterile dressing. The patient tolerated the procedure well, without any complication.  IMPRESSION: 1. Technically successful tunneled right IJ catheter removal.   Electronically Signed   By: Arne Cleveland M.D.   On: 01/04/2014 11:44   Ir US Guide Vasc Access Right  12/27/2013   CLINICAL DATA:  History of CHF, cardiomyopathy and stage 4 chronic kidney disease. The patient requires central venous access. Request has been made to place a tunneled jugular line due to need to preserve arm veins for potential future dialysis access.  EXAM: TUNNELED CENTRAL VENOUS CATHETER PLACEMENT WITH ULTRASOUND AND FLUOROSCOPIC GUIDANCE  ANESTHESIA/SEDATION: None  MEDICATIONS: None  FLUOROSCOPY TIME:  6 seconds.  PROCEDURE: The procedure, risks, benefits, and alternatives were explained to the patient. Questions regarding the procedure were encouraged and answered. The patient understands and consents to the procedure.  The right neck and chest were prepped with  chlorhexidine in a sterile fashion, and a sterile drape was applied covering the operative field. Maximum barrier sterile technique with sterile gowns and gloves were used for the procedure. Local anesthesia was provided with 1% lidocaine.  After creating a small venotomy incision, a 21 gauge needle was advanced into the right internal jugular vein under direct, real-time ultrasound guidance. Ultrasound image documentation was performed. After securing guidewire access, an 8 Fr dilator was placed. A wire was kinked to measure appropriate catheter length.  A 6 French, dual-lumen power line tunneled central venous catheter was tunneled in a retrograde fashion from the chest wall to the venotomy incision.  At the venotomy, serial dilatation was performed and a 6 Fr peel-away sheath was placed over a guidewire. The catheter was cut to 20 cm. The catheter was then placed through the sheath and the sheath removed. Final catheter positioning was confirmed and documented with a fluoroscopic spot image. The catheter was aspirated and flushed with saline.  The venotomy  incision was closed with subcutaneous 4-0 Vicryl. Dermabond was applied to the incision. The catheter exit site was secured with 0-Prolene retention sutures.  COMPLICATIONS: None.  No pneumothorax.  FINDINGS: After catheter placement, the tip lies at the cavoatrial junction. The catheter aspirates normally and is ready for immediate use.  IMPRESSION: Placement of tunneled hemodialysis catheter via the right internal jugular vein. The catheter tip lies at the cavoatrial junction. The catheter is ready for immediate use.   Electronically Signed   By: Aletta Edouard M.D.   On: 12/27/2013 16:52   Dg Chest Port 1 View  12/26/2013   CLINICAL DATA:  Bleeding from around PICC line  EXAM: PORTABLE CHEST - 1 VIEW  COMPARISON:  12/26/2013  FINDINGS: Cardiac shadow remains enlarged. The left-sided PICC line has been withdrawn slightly in the interval and now lies at  the junction of the innominate veins. If the PICC line has a tapered back and this may be a the etiology of the recent hemorrhage surrounding the catheter. The lungs are clear. No bony abnormality is noted.  IMPRESSION: Slight withdrawal of the PICC line as described above.   Electronically Signed   By: Inez Catalina M.D.   On: 12/26/2013 19:20   Dg Chest Port 1 View  12/26/2013   CLINICAL DATA:  PICC line was pulled out of position.  EXAM: PORTABLE CHEST - 1 VIEW  COMPARISON:  12/1913  FINDINGS: Left PICC line tip overlies the mid SVC region without significant change in position since the previous study. No pneumothorax. Mild cardiac enlargement without pulmonary vascular congestion. No focal airspace disease in the lungs. No blunting of costophrenic angles.  IMPRESSION: No significant change in position of PICC line, which remains projected over the mid SVC region. Cardiac enlargement.   Electronically Signed   By: Lucienne Capers M.D.   On: 12/26/2013 06:55   Dg Chest Port 1 View  12/26/2013   CLINICAL DATA:  PICC line placement.  EXAM: PORTABLE CHEST - 1 VIEW  COMPARISON:  12/24/2013  FINDINGS: Left PICC line has been replaced or advanced. Tip projects over the mid SVC region. No pneumothorax. Mild cardiac enlargement without vascular congestion. No focal airspace disease or consolidation in the lungs. No blunting of costophrenic angles. No pneumothorax. Calcified and tortuous aorta.  IMPRESSION: Left PICC line tip overlies the mid SVC region. No pneumothorax. No evidence of active pulmonary disease. Mild cardiac enlargement.   Electronically Signed   By: Lucienne Capers M.D.   On: 12/26/2013 01:59   Dg Chest Port 1 View  12/24/2013   CLINICAL DATA:  Evaluate PICC line placement  EXAM: PORTABLE CHEST - 1 VIEW  COMPARISON:  12/14/2013  FINDINGS: The left arm PICC line tip is identified at the junction between the innominate vein and the SVC. The heart size is enlarged. There is atherosclerotic  plaque within the aortic arch. There is no pleural effusion or edema. No airspace consolidation.  IMPRESSION: 1. Tip of the PICC line is at the junction between the innominate vein and SVC.   Electronically Signed   By: Kerby Moors M.D.   On: 12/24/2013 20:00   Dg Chest Port 1 View  12/24/2013   CLINICAL DATA:  Central line placement  EXAM: PORTABLE CHEST - 1 VIEW  COMPARISON:  12/21/2013  FINDINGS: Cardiomegaly is noted. There is left arm PICC line with tip in SVC. Tiny left apical pneumothorax. No acute infiltrate or pulmonary edema.  IMPRESSION: Left PICC line in place. No acute  infiltrate or pulmonary edema. Tiny left apical pneumothorax.   Electronically Signed   By: Lahoma Crocker M.D.   On: 12/24/2013 16:43   Dg Chest Port 1 View  12/21/2013   CLINICAL DATA:  Shortness of breath  EXAM: PORTABLE CHEST - 1 VIEW  COMPARISON:  10/30/2013  FINDINGS: There is tenting of the diaphragm. There is bilateral mild interstitial thickening. There is no focal parenchymal opacity, pleural effusion, or pneumothorax. There is stable cardiomegaly. There is thoracic aortic atherosclerosis.  The osseous structures are unremarkable.  IMPRESSION: Cardiomegaly with pulmonary vascular congestion.   Electronically Signed   By: Kathreen Devoid   On: 12/21/2013 21:23    CBC  Recent Labs Lab 01/04/14 0510 01/05/14 0220 01/06/14 0232 01/07/14 0316 01/08/14 0615  WBC 6.1 4.2 4.2 3.7* 3.4*  HGB 9.2* 8.5* 8.6* 9.2* 8.9*  HCT 28.0* 26.7* 26.6* 28.8* 28.8*  PLT 82* 85* 87* 79* 90*  MCV 99.3 96.7 96.0 96.3 97.0  MCH 32.6 30.8 31.0 30.8 30.0  MCHC 32.9 31.8 32.3 31.9 30.9  RDW 19.6* 19.4* 19.1* 19.2* 19.4*    Chemistries   Recent Labs Lab 01/04/14 0510 01/05/14 0220 01/06/14 0232 01/07/14 0316 01/08/14 0615  NA 136* 135* 135* 137 138  K 3.2* 4.1 4.2 3.7 3.8  CL 91* 92* 92* 94* 98  CO2 29 28 28 26 28   GLUCOSE 84 172* 151* 116* 82  BUN 88* 95* 93* 96* 89*  CREATININE 3.04* 3.28* 3.14* 2.94* 2.72*  CALCIUM  8.4 8.2* 8.2* 8.2* 8.4  MG  --  2.2  --   --   --   AST  --   --   --   --  30  ALT  --   --   --   --  6  ALKPHOS  --   --   --   --  149*  BILITOT  --   --   --   --  0.8   ------------------------------------------------------------------------------------------------------------------ estimated creatinine clearance is 15.8 mL/min (by C-G formula based on Cr of 2.72). ------------------------------------------------------------------------------------------------------------------ No results for input(s): HGBA1C in the last 72 hours. ------------------------------------------------------------------------------------------------------------------ No results for input(s): CHOL, HDL, LDLCALC, TRIG, CHOLHDL, LDLDIRECT in the last 72 hours. ------------------------------------------------------------------------------------------------------------------ No results for input(s): TSH, T4TOTAL, T3FREE, THYROIDAB in the last 72 hours.  Invalid input(s): FREET3 ------------------------------------------------------------------------------------------------------------------ No results for input(s): VITAMINB12, FOLATE, FERRITIN, TIBC, IRON, RETICCTPCT in the last 72 hours.  Coagulation profile No results for input(s): INR, PROTIME in the last 168 hours.  No results for input(s): DDIMER in the last 72 hours.  Cardiac Enzymes No results for input(s): CKMB, TROPONINI, MYOGLOBIN in the last 168 hours.  Invalid input(s): CK ------------------------------------------------------------------------------------------------------------------ Invalid input(s): POCBNP    Zyron Deeley D.O. on 01/08/2014 at 9:58 AM  Between 7am to 7pm - Pager - [phone]  After 7pm go to [email or web address] - password [email or web address]  And look for the night coverage person covering for me after hours  [facility] Office  [phone]

## 2014-01-08 NOTE — Progress Notes (Signed)
Patient ID: [name] [name], female   DOB: 1950/08/04, 63 y.o.   MRN: [id number] Advanced Heart Failure Rounding Note   Subjective:    63 yo with history of CAD and systolic HF due to ischemic cardiomyopathy with EF 30-35% and pulmonary venous hypertension, myelodysplastic syndrome, GI bleed, and CKD . Admitted with volume overload and acute on chronic renal failure.   Was scheduled for d/c 11/28 but became febrile when PICC was pulled so kept in house. BCx drawn 2/2 GNR.  Ucx = pseudomonas.    Afebrile overnight.  Denies dyspnea. Wants to go home. Weight stable. Tolerating hydralazine. BMET pending.     Marland Kitchen antiseptic oral rinse  7 mL Mouth Rinse BID  . aspirin EC  81 mg Oral Daily  . feeding supplement (GLUCERNA SHAKE)  237 mL Oral Daily  . hydrALAZINE  25 mg Oral 3 times per day  . hydrocerin   Topical BID  . imipenem-cilastatin  250 mg Intravenous Q12H  . insulin aspart  0-15 Units Subcutaneous TID WC  . insulin aspart  0-5 Units Subcutaneous QHS  . isosorbide mononitrate  30 mg Oral Daily  . lubriderm seriously sensitive   Topical BID  . mirtazapine  15 mg Oral QHS  . neomycin-bacitracin-polymyxin   Topical BID  . torsemide  40 mg Oral Daily    Objective:   Weight Range:  Vital Signs:   Temp:  [97.6 F (36.4 C)-98.6 F (37 C)] 97.9 F (36.6 C) (12/02 0553) Pulse Rate:  [71-92] 71 (12/02 0553) Resp:  [13-28] 17 (12/02 0553) BP: (105-157)/(45-62) 141/61 mmHg (12/02 0553) SpO2:  [99 %-100 %] 100 % (12/02 0553) Weight:  [48.807 kg (107 lb 9.6 oz)] 48.807 kg (107 lb 9.6 oz) (12/02 0500) Last BM Date: 01/07/14  Weight change: Filed Weights   01/07/14 0400 01/08/14 0047 01/08/14 0500  Weight: 50.7 kg (111 lb 12.4 oz) 48.807 kg (107 lb 9.6 oz) 48.807 kg (107 lb 9.6 oz)    Intake/Output:   Intake/Output Summary (Last 24 hours) at 01/08/14 0650 Last data filed at 01/08/14 0100  Gross per 24 hour  Intake    440 ml  Output    575 ml  Net   -135 ml     Physical  Exam: General:  Chronically ill appearing. No resp difficulty HEENT: normal diffuse exfoliative rash Neck: supple. JVP jaw Carotids 2+ bilat; no bruits. No lymphadenopathy or thryomegaly appreciated. Cor: PMI nondisplaced. Regular rate & rhythm. No rubs,  or murmurs. + S3  R chest power PICC Lungs: clear Abdomen: soft, nontender, +distended. No hepatosplenomegaly. No bruits or masses. Good bowel sounds. Extremities: no cyanosis, clubbing, rash trace tr leg edema  RUE infiltrate Neuro: alert & orientedx3, cranial nerves grossly intact. moves all 4 extremities w/o difficulty. Affect pleasant Skin: Dry scales all over her body.   Telemetry: SR   Labs: Basic Metabolic Panel:  Recent Labs Lab 01/03/14 0500 01/04/14 0510 01/05/14 0220 01/06/14 0232 01/07/14 0316  NA 137 136* 135* 135* 137  K 3.5* 3.2* 4.1 4.2 3.7  CL 92* 91* 92* 92* 94*  CO2 30 29 28 28 26   GLUCOSE 92 84 172* 151* 116*  BUN 89* 88* 95* 93* 96*  CREATININE 2.84* 3.04* 3.28* 3.14* 2.94*  CALCIUM 8.3* 8.4 8.2* 8.2* 8.2*  MG  --   --  2.2  --   --   PHOS  --   --  4.1  --   --     Liver Function Tests:  Recent Labs Lab 01/05/14 0220  ALBUMIN 2.2*   No results for input(s): LIPASE, AMYLASE in the last 168 hours. No results for input(s): AMMONIA in the last 168 hours.  CBC:  Recent Labs Lab 01/03/14 0500 01/04/14 0510 01/05/14 0220 01/06/14 0232 01/07/14 0316  WBC 6.3 6.1 4.2 4.2 3.7*  HGB 8.4* 9.2* 8.5* 8.6* 9.2*  HCT 26.3* 28.0* 26.7* 26.6* 28.8*  MCV 96.7 99.3 96.7 96.0 96.3  PLT 77* 82* 85* 87* 79*    Cardiac Enzymes: No results for input(s): CKTOTAL, CKMB, CKMBINDEX, TROPONINI in the last 168 hours.  BNP: BNP (last 3 results)  Recent Labs  10/27/13 1606 10/30/13 0531 12/21/13 2041  PROBNP 16497.0* 12300.0* 15092.0*     Other results:    Imaging: No results found.   Medications:     Scheduled Medications: . antiseptic oral rinse  7 mL Mouth Rinse BID  . aspirin EC  81  mg Oral Daily  . feeding supplement (GLUCERNA SHAKE)  237 mL Oral Daily  . hydrALAZINE  25 mg Oral 3 times per day  . hydrocerin   Topical BID  . imipenem-cilastatin  250 mg Intravenous Q12H  . insulin aspart  0-15 Units Subcutaneous TID WC  . insulin aspart  0-5 Units Subcutaneous QHS  . isosorbide mononitrate  30 mg Oral Daily  . lubriderm seriously sensitive   Topical BID  . mirtazapine  15 mg Oral QHS  . neomycin-bacitracin-polymyxin   Topical BID  . torsemide  40 mg Oral Daily    Infusions:    PRN Medications: acetaminophen **OR** acetaminophen, camphor-menthol, hydrOXYzine, ondansetron **OR** ondansetron (ZOFRAN) IV, oxyCODONE, zolpidem   Assessment:   1. A/C Systolic Heart Failure. Ischemic cardiomyopathy, EF 30-35% with cardiorenal syndrome 2. Acute on chronic renal failure Stage IV: Not a good HD candidate.  3. DM2 4. H/O Gout 5. H/O GI bleed 10/2013  6. Diffuse scaly rash 7. MDS with pancytopenia 8. Fever - possible line infection. BCx 2/2 GNR 9. Pseudomonal UTI   Plan/Discussion:    Tolerating low-dose demadex and hydralazine at 25 q8 (lower than home dose). Volume status looks stable.  Not candidate for ACE or b-blocker.  Psuedomonas UTI and bacteremia per [facility].   Not much more to offer. Continue current meds. If she goes home today please continue current regimen and not regimen she was on at home. Will follow in HF Clinic.   Rasul Decola,MD 6:50 AM

## 2014-01-09 ENCOUNTER — Inpatient Hospital Stay (HOSPITAL_COMMUNITY): Payer: Medicaid Other

## 2014-01-09 LAB — CBC
HEMATOCRIT: 29 % — AB (ref 36.0–46.0)
Hemoglobin: 9.5 g/dL — ABNORMAL LOW (ref 12.0–15.0)
MCH: 31.6 pg (ref 26.0–34.0)
MCHC: 32.8 g/dL (ref 30.0–36.0)
MCV: 96.3 fL (ref 78.0–100.0)
Platelets: 89 10*3/uL — ABNORMAL LOW (ref 150–400)
RBC: 3.01 MIL/uL — ABNORMAL LOW (ref 3.87–5.11)
RDW: 19.2 % — ABNORMAL HIGH (ref 11.5–15.5)
WBC: 4.1 10*3/uL (ref 4.0–10.5)

## 2014-01-09 LAB — BASIC METABOLIC PANEL
Anion gap: 17 — ABNORMAL HIGH (ref 5–15)
BUN: 83 mg/dL — AB (ref 6–23)
CHLORIDE: 96 meq/L (ref 96–112)
CO2: 22 mEq/L (ref 19–32)
Calcium: 8.7 mg/dL (ref 8.4–10.5)
Creatinine, Ser: 2.55 mg/dL — ABNORMAL HIGH (ref 0.50–1.10)
GFR calc Af Amer: 22 mL/min — ABNORMAL LOW (ref >90)
GFR, EST NON AFRICAN AMERICAN: 19 mL/min — AB (ref >90)
Glucose, Bld: 78 mg/dL (ref 70–99)
POTASSIUM: 4.4 meq/L (ref 3.7–5.3)
SODIUM: 135 meq/L — AB (ref 137–147)

## 2014-01-09 LAB — GLUCOSE, CAPILLARY
Glucose-Capillary: 84 mg/dL (ref 70–99)
Glucose-Capillary: 151 mg/dL — ABNORMAL HIGH (ref 70–99)
Glucose-Capillary: 101 mg/dL — ABNORMAL HIGH (ref 70–99)
Glucose-Capillary: 200 mg/dL — ABNORMAL HIGH (ref 70–99)
Glucose-Capillary: 150 mg/dL — ABNORMAL HIGH (ref 70–99)

## 2014-01-09 MED ORDER — CHLORHEXIDINE GLUCONATE 4 % EX LIQD
CUTANEOUS | Status: AC
  Filled 2014-01-09: qty 15

## 2014-01-09 MED ORDER — TORSEMIDE 20 MG PO TABS: 40.0000 mg | ORAL_TABLET | Freq: Every day | ORAL | Status: DC

## 2014-01-09 MED ORDER — SODIUM CHLORIDE 0.9 % IJ SOLN
10.0000 mL | INTRAMUSCULAR | Status: DC | PRN
  Administered 2014-01-10: 10 mL
  Filled 2014-01-09: qty 40

## 2014-01-09 MED ORDER — DEXTROSE 5 % IV SOLN: 1.0000 g | INTRAVENOUS | Status: DC

## 2014-01-09 MED ORDER — CAMPHOR-MENTHOL 0.5-0.5 % EX LOTN: TOPICAL_LOTION | CUTANEOUS | Status: DC | PRN

## 2014-01-09 MED ORDER — ATORVASTATIN CALCIUM 10 MG PO TABS: 10.0000 mg | ORAL_TABLET | Freq: Every day | ORAL | Status: DC

## 2014-01-09 MED ORDER — HEPARIN SOD (PORK) LOCK FLUSH 100 UNIT/ML IV SOLN
INTRAVENOUS | Status: AC
  Filled 2014-01-09: qty 5

## 2014-01-09 MED ORDER — LIDOCAINE HCL 1 % IJ SOLN
INTRAMUSCULAR | Status: AC
  Filled 2014-01-09: qty 20

## 2014-01-09 MED ORDER — GLUCERNA SHAKE PO LIQD: 237.0000 mL | Freq: Every day | ORAL | Status: AC

## 2014-01-09 NOTE — Progress Notes (Signed)
  Stable from HF perspective. Continue current meds. If she goes home today please continue current regimen and not regimen she was on at home. Will follow in HF Clinic. (Will put appt on chart for next week).   Shalene Gallen,MD 11:49 AM

## 2014-01-09 NOTE — Discharge Summary (Signed)
Physician Discharge Summary  [name] [name] IPJ:[id number] DOB: 1950/12/21 DOA: 12/21/2013  PCP: Angelica Chessman, MD  Admit date: 12/21/2013 Discharge date: 01/09/2014  Time spent: 45 minutes  Recommendations for Outpatient Follow-up:  Patient will be discharged home with home health. Patient will need continue her IV and a biotics for total of 2 weeks. Patient will follow-up with her primary care physician within one week of discharge as well as infectious disease if she wishes to do so. Patient will also need follow-up with Dr. Haroldine Laws within 1-2 weeks of discharge.  Patient may also followup with Dr. Alvy Bimler if she and her family to do so. Patient to continue her medications as prescribed. Patient saw a heart healthy/modified diet.  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 Boyce Medici [name] [name] is a 63 y.o. female, With the past medical history significant for coronary artery disease, congestive heart failure with echo done on 10/16/2013 showing ejection fraction of 30-35% and ascites status post paracentesis on 03/2013 presenting with increasing shortness of breath for the last few days with decreased urinary output. Patient is on torsemide twice daily and reports being compliant with her medications. Most of the history was  taken from the son who reports that his mother complains of increaseditching as well theophylline patient was recently diagnosed with rapidly progressing MDS and is being followed at the oncology clinicwith frequent blood transfusions. Her physician at that time considered placing her on hospice however the family and the patient were not ready. No history of chest pains, palpitations, nausea or vomiting. No history of diarrhea. The son reports that she has a chronic rash and always complained of itching which has been worse in the last few days.patient also has history of chronic kidney disease stage IV but due to her comorbidities, dialysis has not been discussed.the son also reports the onset of chills today but no feverishness. In the emergency room the patient was found to be hypothermic and the bear hugger was placed.  Hospital Course:  Interim history Patient was started on Lasix however did not tolerate Lasix alone. Nephrology and cardiology both consulted. Creatinine was progressively worsening. Patient declined central line placement for IV milrinone cardiology was able to have PICC line placed however patient is not a candidate for HD. Patient did have some hypothermia are clear. Oncology was consulted, prognosis was documented as poor as patient was not a candidate for systemic treatment of high-grade myelodysplastic syndrome. Dr. Alvy Bimler recommended hospice however family was not ready. Cardiology started patient on milrinone as well as Lasix and metolazone, patient had excellent diuretic response. Follow 24 2015, her creatinine began to rise so Lasix and metolazone were discontinued. No neuronal was also discontinued on 1126. Patient unfortunately developed 2 out of 2 blood cultures for gram-negative rods on 01/04/2014, therefore her PICC line was discontinued. Patient also was found to have 100,000 colonies of Pseudomonas aeruginosa in her urine culture. Patient was placed on  Primaxin.  Pseudomonas urinary tract infection -Pansensitive, currently on Primaxin but will discontinue as patient's blood culture sensitivities have returned -Avoiding reinsertion of Foley catheterization was discontinued on 12/12/2013  Entertobacter Cloacae/ Gram-negative rod bacteremia -Sensitive to ceftazidime, will start 1g daily (due to renal insufficiency) -Spoke with Dr. 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 Haroldine Laws -Continue Demadex 40 mg daily  Acute on CKD, stage IV, cardiorenal syndrome -Patient did have EKG changes on admission -Nephrology was consulted and appreciated, signed off on 11/18 -Creatinine has waxed and waned with adjustments in diuretic dosing -Patient is not a candidate for hemodialysis -Palliative care was consulted however patient's CODE STATUS was not changed and will need to be very addressed -Creatinine improving and should monitored by her PCP or nephrology  Essential hypertension -Currently stable, continue hydralazine and diuretics  Hyperlipidemia -Statin was held due to elevated LFTs -AST and ALT trending downward and normalized -Continue statin  Diabetes mellitus, type II with peripheral neuropathy -Continue to monitor CBGs and insulin sliding scale -Hemoglobin A1c 6.2 in September 2015  Protein calorie  malnutrition -Nutrition consulted -Patient's nutritional status improved after she began intaking food from home  Anemia of chronic disease/thrombocytopenia and MDS (high-grade) -Per oncology outpatient notes, transfuse if hemoglobin less than 8 -On 12/24/2013, hemoglobin of 7, she was given 2 units packed red blood cells -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 and has poor prognosis, palliative care was seen however patient's family was not interested in hospice care -Hb 9.5 -Platelets improving -Continue to monitor CBC, followup with oncology if patient wishes to do so  Exfoliative dermatitis -Not consistent with drug rash -Continue Neosporin for excoriated wounds, lotions to intact skin -Patient completed a seven-day course of IV Ancef for prophylaxis of diffuse cellulitis  Procedures: Therapeutic paracentesis on 12/23/2013 by interventional radiology Right chest tunneled PICC line placed on 1116 and discontinued 12/27/2013 Placement of Right IJ PICC line by interventional radiology on 01/09/2014   Consultations:  Cardiology, [facility], Dr. Posey Pronto Palliative care, Dr. Deitra Mayo Infectious disease, Dr. Johnnye Sima, via phone  Discharge Exam: Filed Vitals:   01/09/14 1347  BP: 125/65  Pulse: 68  Temp: 97.5 F (36.4 C)  Resp: 18   Exam  General: Well developed, well nourished, NAD, appears stated age  HEENT: NCAT, mucous membranes moist.   Cardiovascular: S1 S2 auscultated, no rubs, murmurs or gallops. Regular rate and rhythm.  Respiratory: Clear to auscultation bilaterally with equal chest rise  Abdomen: Soft, nontender, nondistended, + bowel sounds  Extremities: warm dry without cyanosis clubbing or edema  Neuro: Awake and alert, no focal deficits  Skin: Dry, scaly, flaky  Discharge Instructions      Discharge Instructions    Diet - low sodium heart healthy    Complete by:  As directed      Discharge instructions    Complete by:  As directed   Patient  will be discharged home with home health. Patient will need continue her IV and a biotics for total of 2  weeks. Patient will follow-up with her primary care physician within one week of discharge as well as infectious disease if she wishes to do so. Patient will also need follow-up with Dr. Haroldine Laws within 1-2 weeks of discharge.  Patient may also followup with Dr. Alvy Bimler if she and her family to do so. Patient to continue her medications as prescribed. Patient saw a heart healthy/modified diet.     Increase activity slowly    Complete by:  As directed             Medication List    STOP taking these medications        carvedilol 3.125 MG tablet  Commonly known as:  COREG      TAKE these medications        aspirin 81 MG EC tablet  Take 1 tablet (81 mg total) by mouth daily.     atorvastatin 10 MG tablet  Commonly known as:  LIPITOR  Take 1 tablet (10 mg total) by mouth daily at 6 PM.     camphor-menthol lotion  Commonly known as:  SARNA  Apply topically as needed for itching.     cefTAZidime 1 g in dextrose 5 % 50 mL  Inject 1 g into the vein daily.     feeding supplement (GLUCERNA SHAKE) Liqd  Take 237 mLs by mouth daily.     hydrALAZINE 25 MG tablet  Commonly known as:  APRESOLINE  Take 1 tablet (25 mg total) by mouth 3 (three) times daily.     hydrOXYzine 25 MG tablet  Commonly known as:  ATARAX/VISTARIL  Take 1 tablet (25 mg total) by mouth every 6 (six) hours as needed for itching.     isosorbide mononitrate 30 MG 24 hr tablet  Commonly known as:  IMDUR  Take 1 tablet (30 mg total) by mouth daily.     mirtazapine 15 MG tablet  Commonly known as:  REMERON  Take 1 tablet (15 mg total) by mouth at bedtime.     torsemide 20 MG tablet  Commonly known as:  DEMADEX  Take 2 tablets (40 mg total) by mouth daily.       No Known Allergies Follow-up Information    Follow up with JEGEDE, OLUGBEMIGA, MD. Schedule an appointment as soon as possible for a visit in 5  days.   Specialty:  Internal Medicine   Contact information:   [place] [address] [phone]       Follow up with Glori Bickers, MD On 01/15/2014.   Specialty:  Cardiology   Why:  at [place] information:   [place] [address] [phone]       Follow up with Angelica Chessman, MD. Schedule an appointment as soon as possible for a visit in 1 week.   Specialty:  Internal Medicine   Why:  Hospital followup   Contact information:   [place] [address] [phone]       Follow up with [facility].   Why:  Registered Nurse Services to start within 24 hours of discharge home   Contact information:   [address] [place] [address] [phone]       Follow up with [facility].   Why:  Rolling Walker to be delivered to room prior to discharge home   Contact information:   [place]  [phone]        The results of significant diagnostics from this hospitalization (including imaging, microbiology, ancillary and laboratory) are listed below for reference.    Significant Diagnostic Studies: US Renal  12/23/2013   CLINICAL DATA:  63 year old female with acute renal injury. Initial encounter.  EXAM: RENAL/URINARY TRACT ULTRASOUND COMPLETE  COMPARISON:  Abdomen MRI 03/12/2013 and earlier.  FINDINGS: Right Kidney:  Length: 7.9 cm.  Echogenic cortex.  No hydronephrosis or renal mass.  Left Kidney:  Length: 10.6 cm. No hydronephrosis. Cortical echogenicity not as pronounced as that on the right. Small simple appearing cortical cysts measure up to 12 mm and are stable.  Bladder:  Not visualized, reportedly a Foley catheter is in place.  Other findings: Small volume ascites is new. Right pleural effusion, which was on prior studies.  IMPRESSION: 1. Chronic medical renal disease perhaps greater on the  right. No acute renal findings. 2. New small volume ascites.  Chronic right pleural effusion.   Electronically Signed   By: Lars Pinks M.D.   On: 12/23/2013 08:55   US Paracentesis  12/23/2013   CLINICAL DATA:  Abdominal distention, congestive heart failure, renal insufficiency. Request therapeutic paracentesis.  EXAM: ULTRASOUND GUIDED PARACENTESIS  COMPARISON:  None.  PROCEDURE: An ultrasound guided paracentesis was thoroughly discussed with the patient and questions answered. The benefits, risks, alternatives and complications were also discussed. The patient understands and wishes to proceed with the procedure. Written consent was obtained.  Ultrasound was performed to localize and mark an adequate pocket of fluid in the right lower quadrant of the abdomen. The area was then prepped and draped in the normal sterile fashion. 1% Lidocaine was used for local anesthesia. Under ultrasound guidance a 19 gauge Yueh catheter was introduced. Paracentesis was performed. The catheter was removed and a dressing applied.  COMPLICATIONS: None immediate  FINDINGS: A total of approximately 1.1 L of bloody ascitic fluid was removed. A fluid sample was not sent for laboratory analysis.  IMPRESSION: Successful ultrasound guided paracentesis yielding 1.1 L of ascites.  Read by: Ascencion Dike PA-C   Electronically Signed   By: Markus Daft M.D.   On: 12/23/2013 15:59   Ir Fluoro Guide Cv Line Right  01/09/2014   INDICATION: History of CHF, cardiomyopathy, stage 4 chronic kidney disease, admitted with bacteremia. Patient in need of central venous access for continuation of the antibiotics. Request made to place tunneled jugular line due to need to preserve arm veins for potential dialysis access.  EXAM: TUNNELED PICC PLACEMENT WITH ULTRASOUND AND FLUOROSCOPIC GUIDANCE  MEDICATIONS: The patient is currently admitted to the hospital and receiving intravenous antibiotics. The IV antibiotic was given in an appropriate time interval  prior to skin puncture.  CONTRAST:  None  ANESTHESIA/SEDATION: None  FLUOROSCOPY TIME:  12 seconds (3 mGy)  COMPLICATIONS: None immediate  PROCEDURE: Informed written consent was obtained from the patient after a discussion of the risks, benefits, and alternatives to treatment. Questions regarding the procedure were encouraged and answered. The right neck and chest were prepped with chlorhexidine in a sterile fashion, and a sterile drape was applied covering the operative field. Maximum barrier sterile technique with sterile gowns and gloves were used for the procedure. A timeout was performed prior to the initiation of the procedure.  After creating a small venotomy incision, a micropuncture kit was utilized to access the right internal jugular vein under direct, real-time ultrasound guidance after the overlying soft tissues were anesthetized with 1% lidocaine with epinephrine.  Ultrasound image documentation was performed. The microwire was kinked to measure appropriate catheter length. The micropuncture sheath was exchanged for a peel-away sheath. A single-lumen power injectable PICC measuring 21 cm from tip to cuff was tunneled in a retrograde fashion from the anterior chest wall to the venotomy incision.  The catheter was then placed through the peel-away sheath with tips ultimately positioned within the superior aspect of the right atrium. Final catheter positioning was confirmed and documented with a spot radiographic image. The catheter aspirates and flushes normally  The catheter exit site was secured with a 0-Prolene retention suture. The venotomy incision was closed with Dermabond and Steri-strips. Dressings were applied. The patient tolerated the procedure well without immediate post procedural complication.  IMPRESSION: Successful placement of 21 cm tip to cuff tunneled PICC via the right internal jugular vein with tips terminating within the superior aspect of the right atrium. The catheter is ready for  immediate use.   Electronically Signed   By: Sandi Mariscal M.D.   On: 01/09/2014 14:12   Ir Fluoro Guide Cv Line Right  12/27/2013   CLINICAL DATA:  History of CHF, cardiomyopathy and stage 4 chronic kidney disease. The patient requires central venous access. Request has been made to place a tunneled jugular line due to need to preserve arm veins for potential future dialysis access.  EXAM: TUNNELED CENTRAL VENOUS CATHETER PLACEMENT WITH ULTRASOUND AND FLUOROSCOPIC GUIDANCE  ANESTHESIA/SEDATION: None  MEDICATIONS: None  FLUOROSCOPY TIME:  6 seconds.  PROCEDURE: The procedure, risks, benefits, and alternatives were explained to the patient. Questions regarding the procedure were encouraged and answered. The patient understands and consents to the procedure.  The right neck and chest were prepped with chlorhexidine in a sterile fashion, and a sterile drape was applied covering the operative field. Maximum barrier sterile technique with sterile gowns and gloves were used for the procedure. Local anesthesia was provided with 1% lidocaine.  After creating a small venotomy incision, a 21 gauge needle was advanced into the right internal jugular vein under direct, real-time ultrasound guidance. Ultrasound image documentation was performed. After securing guidewire access, an 8 Fr dilator was placed. A wire was kinked to measure appropriate catheter length.  A 6 French, dual-lumen power line tunneled central venous catheter was tunneled in a retrograde fashion from the chest wall to the venotomy incision.  At the venotomy, serial dilatation was performed and a 6 Fr peel-away sheath was placed over a guidewire. The catheter was cut to 20 cm. The catheter was then placed through the sheath and the sheath removed. Final catheter positioning was confirmed and documented with a fluoroscopic spot image. The catheter was aspirated and flushed with saline.  The venotomy incision was closed with subcutaneous 4-0 Vicryl. Dermabond  was applied to the incision. The catheter exit site was secured with 0-Prolene retention sutures.  COMPLICATIONS: None.  No pneumothorax.  FINDINGS: After catheter placement, the tip lies at the cavoatrial junction. The catheter aspirates normally and is ready for immediate use.  IMPRESSION: Placement of tunneled hemodialysis catheter via the right internal jugular vein. The catheter tip lies at the cavoatrial junction. The catheter is ready for immediate use.   Electronically Signed   By: Aletta Edouard M.D.   On: 12/27/2013 16:52   Ir Removal Tun Cv Cath W/o Fl  01/04/2014   CLINICAL DATA:  Tunneled PICC catheter, fever, chills  EXAM: TUNNELED RIGHT IJ CATHETER REMOVAL:  TECHNIQUE: Overlying skin prepped with chlorhexidine, draped in usual sterile fashion .  The previously placed tunneled right IJ catheter was dissected free from the underlying soft tissues and removed intact. Hemostasis was achieved. Site covered with a sterile dressing. The patient tolerated the procedure well, without any complication.  IMPRESSION: 1. Technically successful tunneled right IJ catheter removal.   Electronically Signed   By: Arne Cleveland M.D.   On: 01/04/2014 11:44   Ir US Guide Vasc Access Right  01/09/2014   INDICATION: History of CHF, cardiomyopathy, stage 4 chronic kidney disease, admitted with bacteremia. Patient in need of central venous access for continuation of the antibiotics. Request made to place tunneled jugular line due to need to preserve arm veins for potential dialysis access.  EXAM: TUNNELED PICC PLACEMENT WITH ULTRASOUND AND FLUOROSCOPIC GUIDANCE  MEDICATIONS: The patient is currently admitted to the hospital and receiving intravenous antibiotics. The IV antibiotic was given in an appropriate time interval prior to skin puncture.  CONTRAST:  None  ANESTHESIA/SEDATION: None  FLUOROSCOPY TIME:  12 seconds (3 mGy)  COMPLICATIONS: None immediate  PROCEDURE: Informed written consent was obtained from the  patient after a discussion of the risks, benefits, and alternatives to treatment. Questions regarding the procedure were encouraged and answered. The right neck and chest were prepped with chlorhexidine in a sterile fashion, and a sterile drape was applied covering the operative field. Maximum barrier sterile technique with sterile gowns and gloves were used for the procedure. A timeout was performed prior to the initiation of the procedure.  After creating a small venotomy incision, a micropuncture kit was utilized to access the right internal jugular vein under direct, real-time ultrasound guidance after the overlying soft tissues were anesthetized with 1% lidocaine with epinephrine. Ultrasound image documentation was performed. The microwire was kinked to measure appropriate catheter length. The micropuncture sheath was exchanged for a peel-away sheath. A single-lumen power injectable PICC measuring 21 cm from tip to cuff was tunneled in a retrograde fashion from the anterior chest wall to the venotomy incision.  The catheter was then placed through the peel-away sheath with tips ultimately positioned within the superior aspect of the right atrium. Final catheter positioning was confirmed and documented with a spot radiographic image. The catheter aspirates and flushes normally  The catheter exit site was secured with a 0-Prolene retention suture. The venotomy incision was closed with Dermabond and Steri-strips. Dressings were applied. The patient tolerated the procedure well without immediate post procedural complication.  IMPRESSION: Successful placement of 21 cm tip to cuff tunneled PICC via the right internal jugular vein with tips terminating within the superior aspect of the right atrium. The catheter is ready for immediate use.   Electronically Signed   By: Sandi Mariscal M.D.   On: 01/09/2014 14:12   Ir US Guide Vasc Access Right  12/27/2013   CLINICAL DATA:  History of CHF, cardiomyopathy and stage 4  chronic kidney disease. The patient requires central venous access. Request has been made to place a tunneled jugular line due to need to preserve arm veins for potential future dialysis access.  EXAM: TUNNELED CENTRAL VENOUS CATHETER PLACEMENT WITH ULTRASOUND AND FLUOROSCOPIC GUIDANCE  ANESTHESIA/SEDATION: None  MEDICATIONS: None  FLUOROSCOPY TIME:  6 seconds.  PROCEDURE: The procedure, risks, benefits, and alternatives were explained to the patient. Questions regarding the procedure were encouraged and answered. The patient understands and consents to the procedure.  The right neck and chest were prepped with chlorhexidine in a sterile fashion, and a sterile drape was applied covering the operative field. Maximum barrier sterile technique  with sterile gowns and gloves were used for the procedure. Local anesthesia was provided with 1% lidocaine.  After creating a small venotomy incision, a 21 gauge needle was advanced into the right internal jugular vein under direct, real-time ultrasound guidance. Ultrasound image documentation was performed. After securing guidewire access, an 8 Fr dilator was placed. A wire was kinked to measure appropriate catheter length.  A 6 French, dual-lumen power line tunneled central venous catheter was tunneled in a retrograde fashion from the chest wall to the venotomy incision.  At the venotomy, serial dilatation was performed and a 6 Fr peel-away sheath was placed over a guidewire. The catheter was cut to 20 cm. The catheter was then placed through the sheath and the sheath removed. Final catheter positioning was confirmed and documented with a fluoroscopic spot image. The catheter was aspirated and flushed with saline.  The venotomy incision was closed with subcutaneous 4-0 Vicryl. Dermabond was applied to the incision. The catheter exit site was secured with 0-Prolene retention sutures.  COMPLICATIONS: None.  No pneumothorax.  FINDINGS: After catheter placement, the tip lies at the  cavoatrial junction. The catheter aspirates normally and is ready for immediate use.  IMPRESSION: Placement of tunneled hemodialysis catheter via the right internal jugular vein. The catheter tip lies at the cavoatrial junction. The catheter is ready for immediate use.   Electronically Signed   By: Aletta Edouard M.D.   On: 12/27/2013 16:52   Dg Chest Port 1 View  12/26/2013   CLINICAL DATA:  Bleeding from around PICC line  EXAM: PORTABLE CHEST - 1 VIEW  COMPARISON:  12/26/2013  FINDINGS: Cardiac shadow remains enlarged. The left-sided PICC line has been withdrawn slightly in the interval and now lies at the junction of the innominate veins. If the PICC line has a tapered back and this may be a the etiology of the recent hemorrhage surrounding the catheter. The lungs are clear. No bony abnormality is noted.  IMPRESSION: Slight withdrawal of the PICC line as described above.   Electronically Signed   By: Inez Catalina M.D.   On: 12/26/2013 19:20   Dg Chest Port 1 View  12/26/2013   CLINICAL DATA:  PICC line was pulled out of position.  EXAM: PORTABLE CHEST - 1 VIEW  COMPARISON:  12/1913  FINDINGS: Left PICC line tip overlies the mid SVC region without significant change in position since the previous study. No pneumothorax. Mild cardiac enlargement without pulmonary vascular congestion. No focal airspace disease in the lungs. No blunting of costophrenic angles.  IMPRESSION: No significant change in position of PICC line, which remains projected over the mid SVC region. Cardiac enlargement.   Electronically Signed   By: Lucienne Capers M.D.   On: 12/26/2013 06:55   Dg Chest Port 1 View  12/26/2013   CLINICAL DATA:  PICC line placement.  EXAM: PORTABLE CHEST - 1 VIEW  COMPARISON:  12/24/2013  FINDINGS: Left PICC line has been replaced or advanced. Tip projects over the mid SVC region. No pneumothorax. Mild cardiac enlargement without vascular congestion. No focal airspace disease or consolidation in the  lungs. No blunting of costophrenic angles. No pneumothorax. Calcified and tortuous aorta.  IMPRESSION: Left PICC line tip overlies the mid SVC region. No pneumothorax. No evidence of active pulmonary disease. Mild cardiac enlargement.   Electronically Signed   By: Lucienne Capers M.D.   On: 12/26/2013 01:59   Dg Chest Port 1 View  12/24/2013   CLINICAL DATA:  Evaluate PICC line  placement  EXAM: PORTABLE CHEST - 1 VIEW  COMPARISON:  12/14/2013  FINDINGS: The left arm PICC line tip is identified at the junction between the innominate vein and the SVC. The heart size is enlarged. There is atherosclerotic plaque within the aortic arch. There is no pleural effusion or edema. No airspace consolidation.  IMPRESSION: 1. Tip of the PICC line is at the junction between the innominate vein and SVC.   Electronically Signed   By: Kerby Moors M.D.   On: 12/24/2013 20:00   Dg Chest Port 1 View  12/24/2013   CLINICAL DATA:  Central line placement  EXAM: PORTABLE CHEST - 1 VIEW  COMPARISON:  12/21/2013  FINDINGS: Cardiomegaly is noted. There is left arm PICC line with tip in SVC. Tiny left apical pneumothorax. No acute infiltrate or pulmonary edema.  IMPRESSION: Left PICC line in place. No acute infiltrate or pulmonary edema. Tiny left apical pneumothorax.   Electronically Signed   By: Lahoma Crocker M.D.   On: 12/24/2013 16:43   Dg Chest Port 1 View  12/21/2013   CLINICAL DATA:  Shortness of breath  EXAM: PORTABLE CHEST - 1 VIEW  COMPARISON:  10/30/2013  FINDINGS: There is tenting of the diaphragm. There is bilateral mild interstitial thickening. There is no focal parenchymal opacity, pleural effusion, or pneumothorax. There is stable cardiomegaly. There is thoracic aortic atherosclerosis.  The osseous structures are unremarkable.  IMPRESSION: Cardiomegaly with pulmonary vascular congestion.   Electronically Signed   By: Kathreen Devoid   On: 12/21/2013 21:23    Microbiology: Recent Results (from the past 240 hour(s))   Culture, blood (routine x 2)     Status: None   Collection Time: 01/04/14 12:10 PM  Result Value Ref Range Status   Specimen Description BLOOD LEFT HAND  Final   Special Requests BOTTLES DRAWN AEROBIC AND ANAEROBIC 10CC EACH  Final   Culture  Setup Time   Final    01/04/2014 21:18 Performed at [facility]    Culture   Final    ENTEROBACTER CLOACAE Note: Gram Stain Report Called to,Read Back By and Verified With: CHRISTY JOHNSON 01/06/14 1110 BY SMITHERSJ Performed at [facility]    Report Status 01/08/2014 FINAL  Final   Organism ID, Bacteria ENTEROBACTER CLOACAE  Final      Susceptibility   Enterobacter cloacae - MIC*    CEFAZOLIN >=64 RESISTANT Resistant     CEFEPIME <=1 SENSITIVE Sensitive     CEFTAZIDIME <=1 SENSITIVE Sensitive     CEFTRIAXONE <=1 SENSITIVE Sensitive     CIPROFLOXACIN <=0.25 SENSITIVE Sensitive     GENTAMICIN <=1 SENSITIVE Sensitive     IMIPENEM 0.5 SENSITIVE Sensitive     PIP/TAZO 8 SENSITIVE Sensitive     TOBRAMYCIN <=1 SENSITIVE Sensitive     TRIMETH/SULFA >=320 RESISTANT Resistant     * ENTEROBACTER CLOACAE  Culture, blood (routine x 2)     Status: None   Collection Time: 01/04/14 12:20 PM  Result Value Ref Range Status   Specimen Description BLOOD RIGHT HAND  Final   Special Requests BOTTLES DRAWN AEROBIC AND ANAEROBIC 10CC EACH  Final   Culture  Setup Time   Final    01/04/2014 21:18 Performed at [facility]    Culture   Final    ENTEROBACTER CLOACAE Note: SUSCEPTIBILITIES PERFORMED ON PREVIOUS CULTURE WITHIN THE LAST 5 DAYS. Note: Culture results may be compromised due to an excessive volume of blood received in culture bottles. Gram Stain  Report Called to,Read Back By and Verified With: Vedia Coffer RN on 01/06/14 at 06:15 by Rise Mu Performed at [facility]    Report Status 01/08/2014 FINAL  Final  Culture, Urine     Status: None   Collection Time: 01/04/14 10:44 PM  Result Value Ref Range Status    Specimen Description URINE, CLEAN CATCH  Final   Special Requests NONE  Final   Culture  Setup Time   Final    01/05/2014 15:25 Performed at [place]   Final    >=100,000 COLONIES/ML Performed at [facility]    Culture   Final    PSEUDOMONAS AERUGINOSA Performed at [facility]    Report Status 01/07/2014 FINAL  Final   Organism ID, Bacteria PSEUDOMONAS AERUGINOSA  Final      Susceptibility   Pseudomonas aeruginosa - MIC*    CEFEPIME <=1 SENSITIVE Sensitive     CEFTAZIDIME 2 SENSITIVE Sensitive     CIPROFLOXACIN <=0.25 SENSITIVE Sensitive     GENTAMICIN <=1 SENSITIVE Sensitive     IMIPENEM 2 SENSITIVE Sensitive     PIP/TAZO <=4 SENSITIVE Sensitive     TOBRAMYCIN <=1 SENSITIVE Sensitive     * PSEUDOMONAS AERUGINOSA  Culture, blood (routine x 2)     Status: None (Preliminary result)   Collection Time: 01/07/14  2:28 PM  Result Value Ref Range Status   Specimen Description BLOOD RIGHT HAND  Final   Special Requests BOTTLES DRAWN AEROBIC AND ANAEROBIC 5CC  Final   Culture  Setup Time   Final    01/07/2014 21:21 Performed at [facility]    Culture   Final           BLOOD CULTURE RECEIVED NO GROWTH TO DATE CULTURE WILL BE HELD FOR 5 DAYS BEFORE ISSUING A FINAL NEGATIVE REPORT Performed at [facility]    Report Status PENDING  Incomplete  Culture, blood (routine x 2)     Status: None (Preliminary result)   Collection Time: 01/07/14  2:37 PM  Result Value Ref Range Status   Specimen Description BLOOD LEFT HAND  Final   Special Requests BOTTLES DRAWN AEROBIC AND ANAEROBIC 5CC  Final   Culture  Setup Time   Final    01/07/2014 21:21 Performed at [facility]    Culture   Final           BLOOD CULTURE RECEIVED NO GROWTH TO DATE CULTURE WILL BE HELD FOR 5 DAYS BEFORE ISSUING A FINAL NEGATIVE REPORT Performed at [facility]    Report Status PENDING  Incomplete     Labs: Basic Metabolic  Panel:  Recent Labs Lab 01/05/14 0220 01/06/14 0232 01/07/14 0316 01/08/14 0615 01/09/14 0555  NA 135* 135* 137 138 135*  K 4.1 4.2 3.7 3.8 4.4  CL 92* 92* 94* 98 96  CO2 28 28 26 28 22   GLUCOSE 172* 151* 116* 82 78  BUN 95* 93* 96* 89* 83*  CREATININE 3.28* 3.14* 2.94* 2.72* 2.55*  CALCIUM 8.2* 8.2* 8.2* 8.4 8.7  MG 2.2  --   --   --   --   PHOS 4.1  --   --   --   --    Liver Function Tests:  Recent Labs Lab 01/05/14 0220 01/08/14 0615  AST  --  30  ALT  --  6  ALKPHOS  --  149*  BILITOT  --  0.8  PROT  --  7.1  ALBUMIN 2.2* 2.3*   No results for input(s): LIPASE, AMYLASE in the last 168 hours. No results for input(s): AMMONIA in the last 168 hours. CBC:  Recent Labs Lab 01/05/14 0220 01/06/14 0232 01/07/14 0316 01/08/14 0615 01/09/14 0555  WBC 4.2 4.2 3.7* 3.4* 4.1  HGB 8.5* 8.6* 9.2* 8.9* 9.5*  HCT 26.7* 26.6* 28.8* 28.8* 29.0*  MCV 96.7 96.0 96.3 97.0 96.3  PLT 85* 87* 79* 90* 89*   Cardiac Enzymes: No results for input(s): CKTOTAL, CKMB, CKMBINDEX, TROPONINI in the last 168 hours. BNP: BNP (last 3 results)  Recent Labs  10/27/13 1606 10/30/13 0531 12/21/13 2041  PROBNP 16497.0* 12300.0* 15092.0*   CBG:  Recent Labs Lab 01/08/14 1624 01/08/14 2132 01/09/14 0521 01/09/14 1109 01/09/14 1353  GLUCAP 140* 207* 84 151* 101*     Signed:  Kyndahl Jablon  [facility] 01/09/2014, 2:19 PM

## 2014-01-09 NOTE — Progress Notes (Signed)
Spoke with Nira Conn in radiology regarding an order to place picc line for home iv ABX.  Instructed that they have other cases they are working on at this time and will send for pt later.  Also informed her that plan is for pt to d/c home today as informed by Dr. Ree Kida.  Will continue to monitor.  Karie Kirks, Therapist, sports.

## 2014-01-09 NOTE — Progress Notes (Signed)
Pt son aware of pt's d/c today and will pick her up bet7/8pm today after work as informed by [facility] nurse who was on the phone.  Karie Kirks, Therapist, sports.

## 2014-01-09 NOTE — Procedures (Signed)
Successful placement of tunelled right IJ approach 21 cm single lumen PICC line with tip at the superior caval-atrial junction.  The PICC line is ready for immediate use.

## 2014-01-09 NOTE — Plan of Care (Signed)
Problem: Phase I Progression Outcomes Goal: EF % per last Echo/documented,Core Reminder form on chart Outcome: Completed/Met Date Met:  01/09/14 EF = 30-35% per ECHO performed on 10/16/13. Goal: Up in chair, BRP Outcome: Completed/Met Date Met:  01/09/14 Goal: Initial discharge plan identified Outcome: Completed/Met Date Met:  01/09/14

## 2014-01-09 NOTE — Progress Notes (Signed)
Pt had bleeding to Rt CW picc line place by IR today for home ABX.  Bleeding to site x2.  Notified ID PA and came up and re-enforced dsg to [place].  Additional bleeding noted around 1800 by CN Lauren who notified Dr. Francena Hanly and instructed to apply pressure to CW, which was done by CN.  No further bleeding noted at site.  Also Dr. Ree Kida informed and instructed to hold d/c today and also Dr watt instructed to hold d/c until tomorrow.  Will continue to monitor.  Karie Kirks, Therapist, sports.

## 2014-01-09 NOTE — Progress Notes (Signed)
Pt's son made aware that pt d/c held until tomorrow due to bleeding on R CW post picc placement.  Verbalized understanding. Karie Kirks, Therapist, sports.

## 2014-01-10 LAB — GLUCOSE, CAPILLARY
GLUCOSE-CAPILLARY: 113 mg/dL — AB (ref 70–99)
GLUCOSE-CAPILLARY: 148 mg/dL — AB (ref 70–99)

## 2014-01-10 MED ORDER — TORSEMIDE 20 MG PO TABS: 40.0000 mg | ORAL_TABLET | Freq: Every day | ORAL | Status: DC

## 2014-01-10 MED ORDER — DEXTROSE 5 % IV SOLN: 1.0000 g | INTRAVENOUS | Status: DC

## 2014-01-10 MED ORDER — MIRTAZAPINE 15 MG PO TABS: 15.0000 mg | ORAL_TABLET | Freq: Every day | ORAL | Status: AC

## 2014-01-10 MED ORDER — HEPARIN SOD (PORK) LOCK FLUSH 100 UNIT/ML IV SOLN
250.0000 [IU] | INTRAVENOUS | Status: AC | PRN
  Administered 2014-01-10: 250 [IU]

## 2014-01-10 MED ORDER — ISOSORBIDE MONONITRATE ER 30 MG PO TB24: 30.0000 mg | ORAL_TABLET | Freq: Every day | ORAL | Status: DC

## 2014-01-10 MED ORDER — HYDROXYZINE HCL 25 MG PO TABS: 25.0000 mg | ORAL_TABLET | Freq: Four times a day (QID) | ORAL | Status: DC | PRN

## 2014-01-10 MED ORDER — ATORVASTATIN CALCIUM 10 MG PO TABS: 10.0000 mg | ORAL_TABLET | Freq: Every day | ORAL | Status: DC

## 2014-01-10 MED ORDER — HYDRALAZINE HCL 25 MG PO TABS: 25.0000 mg | ORAL_TABLET | Freq: Three times a day (TID) | ORAL | Status: DC

## 2014-01-10 NOTE — Progress Notes (Signed)
Pt forgot to take home txs.  [place], pt's son c  phone number called and no answer.  Message left that he left tx to pick up.  Also Called pt's daughter on [phone] for  Son to call to pick up txs. Karie Kirks, Therapist, sports.

## 2014-01-10 NOTE — Progress Notes (Addendum)
Unable to get pt's son on the phone.  Called pt's daughter again and asked which pharmacy MD can call pt's meds in she stated pharmacy on [address].  Informed Dr. Ree Kida  And instructed she will call.  Pt's daughter [place]. Made aware to inform pt's nurse to pick up med.  Verbalized understanding.  Karie Kirks, Therapist, sports.

## 2014-01-10 NOTE — Progress Notes (Signed)
She was to be discharged on 01/09/2014 after receiving a right IJ PICC line. However discharge was held due to continuous bleeding. Pressure was applied. Overnight bleeding did stop. Patient is stable for discharge today, 01/10/2014. No changes made. Please see full discharge dictation done on 01/09/2014.  Time spent: 10 minutes  Kailea Dannemiller D.O. [facility] Pager [phone]  If 7PM-7AM, please contact night-coverage [email or web address] Password [email or web address] 01/10/2014, 7:27 AM

## 2014-01-10 NOTE — Progress Notes (Signed)
Patient d/c and RN spoke patient's son. The son told RN that he will be here @1600  or 1700 to pick up patient.

## 2014-01-10 NOTE — Progress Notes (Signed)
At 1742 pt given d/c orders via [name] interpreter on telephone. PT  verbalized understanding of all d/c instructions as interpreted via phone with son and nurse listening.  D/c off floor via w/c to awaiting transportation to home.  Karie Kirks, Therapist, sports.

## 2014-01-13 LAB — CULTURE, BLOOD (ROUTINE X 2)
CULTURE: NO GROWTH
Culture: NO GROWTH

## 2014-01-14 ENCOUNTER — Other Ambulatory Visit: Payer: Self-pay | Admitting: *Deleted

## 2014-01-14 ENCOUNTER — Telehealth: Payer: Self-pay | Admitting: *Deleted

## 2014-01-14 ENCOUNTER — Inpatient Hospital Stay (HOSPITAL_COMMUNITY): Admit: 2014-01-14 | Payer: Self-pay

## 2014-01-14 MED ORDER — GLUCOSE BLOOD VI STRP: ORAL_STRIP | Status: AC

## 2014-01-14 MED ORDER — ONETOUCH ULTRASOFT LANCETS MISC: Status: AC

## 2014-01-14 MED ORDER — FREESTYLE SYSTEM KIT: 1.0000 | PACK | Status: AC | PRN

## 2014-01-14 NOTE — Telephone Encounter (Signed)
Pt called needing a glucometer. I sent the RX to the pharmacy so the pt could have a glucometer.

## 2014-01-15 ENCOUNTER — Ambulatory Visit (HOSPITAL_COMMUNITY)
Admission: RE | Admit: 2014-01-15 | Discharge: 2014-01-15 | Disposition: A | Discharge status: Home / Self Care | Payer: Medicaid Other | Source: Ambulatory Visit | Attending: Cardiology | Admitting: Cardiology

## 2014-01-15 ENCOUNTER — Other Ambulatory Visit (HOSPITAL_COMMUNITY): Payer: Self-pay

## 2014-01-15 VITALS — BP 130/82 | HR 76 | Resp 18 | Wt 115.0 lb

## 2014-01-15 DIAGNOSIS — N390 Urinary tract infection, site not specified: Secondary | ICD-10-CM | POA: Diagnosis not present

## 2014-01-15 DIAGNOSIS — R609 Edema, unspecified: Secondary | ICD-10-CM | POA: Insufficient documentation

## 2014-01-15 DIAGNOSIS — N184 Chronic kidney disease, stage 4 (severe): Secondary | ICD-10-CM | POA: Diagnosis not present

## 2014-01-15 DIAGNOSIS — R21 Rash and other nonspecific skin eruption: Secondary | ICD-10-CM | POA: Diagnosis not present

## 2014-01-15 DIAGNOSIS — Z79899 Other long term (current) drug therapy: Secondary | ICD-10-CM | POA: Insufficient documentation

## 2014-01-15 DIAGNOSIS — I255 Ischemic cardiomyopathy: Secondary | ICD-10-CM | POA: Diagnosis not present

## 2014-01-15 DIAGNOSIS — B9689 Other specified bacterial agents as the cause of diseases classified elsewhere: Secondary | ICD-10-CM | POA: Diagnosis not present

## 2014-01-15 DIAGNOSIS — I2510 Atherosclerotic heart disease of native coronary artery without angina pectoris: Secondary | ICD-10-CM | POA: Insufficient documentation

## 2014-01-15 DIAGNOSIS — I5022 Chronic systolic (congestive) heart failure: Secondary | ICD-10-CM | POA: Diagnosis not present

## 2014-01-15 DIAGNOSIS — L26 Exfoliative dermatitis: Secondary | ICD-10-CM

## 2014-01-15 DIAGNOSIS — I129 Hypertensive chronic kidney disease with stage 1 through stage 4 chronic kidney disease, or unspecified chronic kidney disease: Secondary | ICD-10-CM | POA: Insufficient documentation

## 2014-01-15 DIAGNOSIS — E1140 Type 2 diabetes mellitus with diabetic neuropathy, unspecified: Secondary | ICD-10-CM | POA: Diagnosis not present

## 2014-01-15 DIAGNOSIS — B965 Pseudomonas (aeruginosa) (mallei) (pseudomallei) as the cause of diseases classified elsewhere: Secondary | ICD-10-CM | POA: Insufficient documentation

## 2014-01-15 MED ORDER — HYDRALAZINE HCL 25 MG PO TABS: 50.0000 mg | ORAL_TABLET | Freq: Three times a day (TID) | ORAL | Status: DC

## 2014-01-15 MED ORDER — METOLAZONE 2.5 MG PO TABS: 2.5000 mg | ORAL_TABLET | Freq: Every day | ORAL | Status: DC | PRN

## 2014-01-15 NOTE — Progress Notes (Signed)
Patient ID: [name] [name], female   DOB: [birth year], 63 y.o.   MRN: [id number] PCP: Primary Cardiologist:  HPI: 63 yo [name] with history of CAD and systolic HF due to ischemic cardiomyopathy with EF 30-35% and pulmonary venous hypertension, myelodysplastic syndrome, GI bleed, and CKD . LHC in 02/2013 demonstrated severe 2v CAD with 100% mRCA with L-R collats, diffuse 80-95% subtotal OM2 (small caliber diffusely diseased vessels - diabetic vessels) >> tx medically.  Ad[facility] 12/21/13 with hypothermia and volume overload and progressive renal failure. Urine culture revealed pseudomonas. Blood culture showed enterobacter Colacae.She will continue ceftazidime for 64 weeks. She required short term milrinone and IV lasix to assist with volume removal. Palliative care consulted and she requested aggressive care. [facility] was set up for home antibiotics. She also had diffuse exfoliative rash felt to be possible related to MDS. D/c weight 109 pounds  She returns for follow up with an interpreter, Braulio Bosch,  and her son. Complaining of facial edema that developed over the last few days. Swelling getting worse. Denies SOB or difficulty swallowing. No significant LE edema. Denies fevers/CP. Appetite ok.  Receiving IV [facility] at home from [facility]. Weight at home 114-116 pounds.    ROS: All systems negative except as listed in HPI, PMH and Problem List.  SH:  History   Social History  . Marital Status: Married    Spouse Name: N/A    Number of Children: N/A  . Years of Education: N/A   Occupational History  . Not on file.   Social History Main Topics  . Smoking status: Never Smoker   . Smokeless tobacco: Never Used  . Alcohol Use: No  . Drug Use: No  . Sexual Activity: Not on file   Other Topics Concern  . Not on file   Social History Narrative   ** Merged History Encounter **       Patient lives with husband and son. Currently without insurance and therefore tries not seek medical  care dt financial concern.     FH:  Family History  Problem Relation Age of Onset  . Diabetes Brother     deceased in 01V dt DM complications    Past Medical History  Diagnosis Date  . Hypertension   . Pneumonia   . CHF (congestive heart failure) 08/2012  . Anemia 08/2012  . UTI (urinary tract infection) 08/22/2012  . Ischemic cardiomyopathy     EF 30-35%  . Coronary artery disease   . Pulmonary hypertension   . NSVT (nonsustained ventricular tachycardia) 02/2013  . Diabetes mellitus     Reportedly diagnosed 2011 but no medication initiated until 04/2011 (Metformin)  . Diabetic peripheral neuropathy     Since 2013  . Thrombocytopenia   . CKD (chronic kidney disease)   . Chronic systolic CHF (congestive heart failure)     Echo (10/16/13):  EF 30% to 35%. Akinesis of inf-lat and inferior, anterior, anterolateral, and apical myocardium. Restrictive physiology.  Mild AI, MAC, mod MR, mild LAE, mild RVE, mild reduced RVSF, mild RAE, mod TR, Mod PI, PASP 79 mmHg, L pleural effusion.  Marland Kitchen Uncontrolled diabetes mellitus   . HTN (hypertension)   . Pulmonary HTN     a. Cath 02/2013: severe pulm HTN.  . CAD (coronary artery disease)     a. Cath 02/2013: severe 2V CAD - 100% mRCA with L-R collaterals; diffuse 80-95% subtotal OM2 (too diffusely diseased to attempt PCA), overall small caliber diffusely diseased coronaries c/w poorly controlled  DM; treated medically.  . CKD (chronic kidney disease), stage IV   . Ischemic cardiomyopathy   . Anemia     a. Prior normal EGD 03/2013. b. Progressive anemia 06/2013 in setting of transient hematemesis that had resolved.   . Thrombocytopenia     a. Liver imaging normal 03/2013.  . Edema     a. RUE edema with neg duplex for DVT 06/2013.  Marland Kitchen Protein calorie malnutrition   . Poor social situation   . Ascites     a. s/p paracentesis 03/2013.  . Valvular heart disease     a. Echo 06/2013: mild AI, mod MR, mod TR, mod PR.    Current Outpatient Prescriptions   Medication Sig Dispense Refill  . aspirin 81 MG EC tablet Take 1 tablet (81 mg total) by mouth daily. 90 tablet 3  . atorvastatin (LIPITOR) 10 MG tablet Take 1 tablet (10 mg total) by mouth daily at 6 PM. 30 tablet 0  . camphor-menthol (SARNA) lotion Apply topically as needed for itching. 222 mL 0  . cefTAZidime 1 g in dextrose 5 % 50 mL Inject 1 g into the vein daily. 14 g 0  . feeding supplement, GLUCERNA SHAKE, (GLUCERNA SHAKE) LIQD Take 237 mLs by mouth daily.  0  . glucose blood test strip Use as instructed 100 each 12  . glucose monitoring kit (FREESTYLE) monitoring kit 1 each by Does not apply route as needed for other. 1 each 0  . hydrALAZINE (APRESOLINE) 25 MG tablet Take 1 tablet (25 mg total) by mouth 3 (three) times daily. 90 tablet 0  . hydrOXYzine (ATARAX/VISTARIL) 25 MG tablet Take 1 tablet (25 mg total) by mouth every 6 (six) hours as needed for itching. 30 tablet 0  . isosorbide mononitrate (IMDUR) 30 MG 24 hr tablet Take 1 tablet (30 mg total) by mouth daily. 30 tablet 0  . Lancets (ONETOUCH ULTRASOFT) lancets Use as instructed 100 each 12  . mirtazapine (REMERON) 15 MG tablet Take 1 tablet (15 mg total) by mouth at bedtime. 30 tablet 0  . torsemide (DEMADEX) 20 MG tablet Take 2 tablets (40 mg total) by mouth daily. 60 tablet 0   No current facility-administered medications for this encounter.    Filed Vitals:   01/15/14 0826  BP: 130/82  Pulse: 76  Resp: 18  Weight: 115 lb (52.164 kg)  SpO2: 98%    PHYSICAL EXAM: General: Chronically ill appearing. No resp difficulty Son present. Arrived in a wheelchair.  HEENT: diffuse exfoliative rash Neck: supple. JVP ~9 Carotids 2+ bilat; no bruits. No lymphadenopathy or thryomegaly appreciated. Cor: PMI nondisplaced. Regular rate & rhythm. No rubs, or murmurs. + S3 R chest power PICC Lungs: clear Abdomen: soft, nontender, non distended. No hepatosplenomegaly. No bruits or masses. Good bowel sounds. Extremities: no  cyanosis, clubbing, tr-1+ edema  Neuro: alert & orientedx3, cranial nerves grossly intact. moves all 4 extremities w/o difficulty. Affect pleasant Skin: Dry scales all over her body.       ASSESSMENT & PLAN: 1. Chronic Systolic Heart Failure. ICM. ECHO 30-35% with cardiorenal syndrome.  NYHA IIIb. Volume status mildly elevated. Continue torsemide 40 mg daily and she will take 2.5 mg metolazone for weight 118 pounds or greater.  Increase hydralazine 50 mg three times a day. Continue Imdur 30 daily No ace due to CKD. No b-blocker due to low output Ask [facility] to check BMET.  Reinforced daily weights, low salt diet, and limiting fluid intake to < 2 liters per  day.   2. Pseudmonas UTI 3/ Enterorbacter Coloace Bactermia- On IV antibiotics  4. CKD Stage IV- not a candidate for hemodialysis 5. Edema- Face. Unsure of the cause. 6. Rash and facial swelling - unsure cause. ? Myelodysplastic syndrome vs meds. Encouraged her to see dermatologist for bx.   Follow up in 4 weeks with MD.   Darrick Grinder 8:56 AM  Patient seen and examined with Darrick Grinder, NP. We discussed all aspects of the encounter. I agree with the assessment and plan as stated above.   Volume status is relatively stable on current regimen. Agree with increasing hydralazine. Instructed to take metolazone for weight 118 or greater. Will check labs with [facility]. Will need skin biopsy as above. Overall prognosis concenring.   Benay Spice 6:17 PM

## 2014-01-15 NOTE — Patient Instructions (Signed)
INCREASE Hydralazine to 50mg  (2 tablets) 3 times daily.  Take Metolazone 2.5mg  (1 tabelt) once daily ONLY IF weight 118 pounds or greater.  Follow up in 4 weeks with Dr. Haroldine Laws.  Do the following things EVERYDAY: 1) Weigh yourself in the morning before breakfast. Write it down and keep it in a log. 2) Take your medicines as prescribed 3) Eat low salt foods-Limit salt (sodium) to 2000 mg per day.  4) Stay as active as you can everyday 5) Limit all fluids for the day to less than 2 liters

## 2014-01-16 ENCOUNTER — Encounter (HOSPITAL_COMMUNITY): Payer: Self-pay | Admitting: Cardiology

## 2014-01-22 ENCOUNTER — Encounter: Payer: Self-pay | Admitting: Internal Medicine

## 2014-01-22 ENCOUNTER — Ambulatory Visit: Payer: Medicaid Other | Attending: Internal Medicine | Admitting: Internal Medicine

## 2014-01-22 VITALS — BP 125/74 | HR 94 | Temp 97.9°F | Resp 16 | Ht 60.0 in | Wt 119.0 lb

## 2014-01-22 DIAGNOSIS — L26 Exfoliative dermatitis: Secondary | ICD-10-CM

## 2014-01-22 DIAGNOSIS — E119 Type 2 diabetes mellitus without complications: Secondary | ICD-10-CM

## 2014-01-22 DIAGNOSIS — B965 Pseudomonas (aeruginosa) (mallei) (pseudomallei) as the cause of diseases classified elsewhere: Secondary | ICD-10-CM

## 2014-01-22 DIAGNOSIS — N184 Chronic kidney disease, stage 4 (severe): Secondary | ICD-10-CM

## 2014-01-22 DIAGNOSIS — D61818 Other pancytopenia: Secondary | ICD-10-CM

## 2014-01-22 DIAGNOSIS — R7881 Bacteremia: Secondary | ICD-10-CM

## 2014-01-22 DIAGNOSIS — I5022 Chronic systolic (congestive) heart failure: Secondary | ICD-10-CM

## 2014-01-22 DIAGNOSIS — L299 Pruritus, unspecified: Secondary | ICD-10-CM

## 2014-01-22 LAB — POCT GLYCOSYLATED HEMOGLOBIN (HGB A1C): HEMOGLOBIN A1C: 5.4

## 2014-01-22 LAB — GLUCOSE, POCT (MANUAL RESULT ENTRY): POC GLUCOSE: 139 mg/dL — AB (ref 70–99)

## 2014-01-22 MED ORDER — CETIRIZINE HCL 10 MG PO TABS: 10.0000 mg | ORAL_TABLET | Freq: Every day | ORAL | Status: AC

## 2014-01-22 NOTE — Progress Notes (Signed)
Complaining SOB with walking, coughing No strength on Lt arm, unable to hold anything Knees feeling shaking and no strength Been going on for two years Taking antibiotic from IV,. ([place] chest )

## 2014-01-22 NOTE — Progress Notes (Signed)
MRN: [id number] Name: [name] [name]  Sex: female Age: 63 y.o. DOB: 29-Oct-1950  Allergies: Review of patient's allergies indicates no known allergies.  Chief Complaint  Patient presents with  . Follow-up    HPI: Patient is 63 y.o. female who has history of diabetes,CAD, pancytopenia, CHF with EF of 30-35%, ascites, last month she was hospitalized with worsening shortness of breath and decreased urinary output, she was started on Lasix but could not tolerate, nephrology and cardiology was on board, also oncology on board since she has recently been diagnosed with high-grade myelodysplastic syndrome and is not a candidate for systemic treatment, cardiology started patient on milrinone, Lasix and metolazone patient symptomatically improved  but her creatinine got worse and Lasix and metolazone were discontinued patient also found to have UTI and was treated with antibiotic, after the discharge she already followed up with her cardiology one week ago, , her medications were optimized her hydralazine was increased, patient has   Past Medical History  Diagnosis Date  . Hypertension   . Pneumonia   . CHF (congestive heart failure) 08/2012  . Anemia 08/2012  . UTI (urinary tract infection) 08/22/2012  . Ischemic cardiomyopathy     EF 30-35%  . Coronary artery disease   . Pulmonary hypertension   . NSVT (nonsustained ventricular tachycardia) 02/2013  . Diabetes mellitus     Reportedly diagnosed 2011 but no medication initiated until 04/2011 (Metformin)  . Diabetic peripheral neuropathy     Since 2013  . Thrombocytopenia   . CKD (chronic kidney disease)   . Chronic systolic CHF (congestive heart failure)     Echo (10/16/13):  EF 30% to 35%. Akinesis of inf-lat and inferior, anterior, anterolateral, and apical myocardium. Restrictive physiology.  Mild AI, MAC, mod MR, mild LAE, mild RVE, mild reduced RVSF, mild RAE, mod TR, Mod PI, PASP 79 mmHg, L pleural effusion.  Marland Kitchen Uncontrolled diabetes mellitus     . HTN (hypertension)   . Pulmonary HTN     a. Cath 02/2013: severe pulm HTN.  . CAD (coronary artery disease)     a. Cath 02/2013: severe 2V CAD - 100% mRCA with L-R collaterals; diffuse 80-95% subtotal OM2 (too diffusely diseased to attempt PCA), overall small caliber diffusely diseased coronaries c/w poorly controlled DM; treated medically.  . CKD (chronic kidney disease), stage IV   . Ischemic cardiomyopathy   . Anemia     a. Prior normal EGD 03/2013. b. Progressive anemia 06/2013 in setting of transient hematemesis that had resolved.   . Thrombocytopenia     a. Liver imaging normal 03/2013.  . Edema     a. RUE edema with neg duplex for DVT 06/2013.  Marland Kitchen Protein calorie malnutrition   . Poor social situation   . Ascites     a. s/p paracentesis 03/2013.  . Valvular heart disease     a. Echo 06/2013: mild AI, mod MR, mod TR, mod PR.    Past Surgical History  Procedure Laterality Date  . Cesarean section    . Tubal ligation    . Esophagogastroduodenoscopy N/A 03/13/2013    Procedure: ESOPHAGOGASTRODUODENOSCOPY (EGD);  Surgeon: Winfield Cunas., MD;  Location[facility] ENDOSCOPY;  Service: Endoscopy;  Laterality: N/A;  . Cardiac catheterization  02/2013    severe 2 vessel CAD  . Left and right heart catheterization with coronary angiogram N/A 03/08/2013    Procedure: LEFT AND RIGHT HEART CATHETERIZATION WITH CORONARY ANGIOGRAM;  Surgeon: Leonie Man, MD;  Location: [facility]  CATH LAB;  Service: Cardiovascular;  Laterality: N/A;      Medication List       This list is accurate as of: 01/22/14  5:08 PM.  Always use your most recent med list.               aspirin 81 MG EC tablet  Take 1 tablet (81 mg total) by mouth daily.     atorvastatin 10 MG tablet  Commonly known as:  LIPITOR  Take 1 tablet (10 mg total) by mouth daily at 6 PM.     camphor-menthol lotion  Commonly known as:  SARNA  Apply topically as needed for itching.     cefTAZidime 1 g in dextrose 5 % 50 mL  Inject 1 g into  the vein daily.     cetirizine 10 MG tablet  Commonly known as:  ZYRTEC  Take 1 tablet (10 mg total) by mouth daily.     feeding supplement (GLUCERNA SHAKE) Liqd  Take 237 mLs by mouth daily.     glucose blood test strip  Use as instructed     glucose monitoring kit monitoring kit  1 each by Does not apply route as needed for other.     hydrALAZINE 25 MG tablet  Commonly known as:  APRESOLINE  Take 2 tablets (50 mg total) by mouth 3 (three) times daily.     hydrOXYzine 25 MG tablet  Commonly known as:  ATARAX/VISTARIL  Take 1 tablet (25 mg total) by mouth every 6 (six) hours as needed for itching.     isosorbide mononitrate 30 MG 24 hr tablet  Commonly known as:  IMDUR  Take 1 tablet (30 mg total) by mouth daily.     metolazone 2.5 MG tablet  Commonly known as:  ZAROXOLYN  Take 1 tablet (2.5 mg total) by mouth daily as needed (for weight 118 pounds or greater.).     mirtazapine 15 MG tablet  Commonly known as:  REMERON  Take 1 tablet (15 mg total) by mouth at bedtime.     onetouch ultrasoft lancets  Use as instructed     torsemide 20 MG tablet  Commonly known as:  DEMADEX  Take 2 tablets (40 mg total) by mouth daily.        Meds ordered this encounter  Medications  . cetirizine (ZYRTEC) 10 MG tablet    Sig: Take 1 tablet (10 mg total) by mouth daily.    Dispense:  30 tablet    Refill:  3    Immunization History  Administered Date(s) Administered  . Influenza,inj,Quad PF,36+ Mos 03/07/2013, 10/17/2013, 10/31/2013  . Pneumococcal Polysaccharide-23 10/17/2013    Family History  Problem Relation Age of Onset  . Diabetes Brother     deceased in 44B dt DM complications    History  Substance Use Topics  . Smoking status: Never Smoker   . Smokeless tobacco: Never Used  . Alcohol Use: No    Review of Systems   As noted in HPI  Filed Vitals:   01/22/14 1632  BP: 125/74  Pulse: 94  Temp: 97.9 F (36.6 C)  Resp: 16    Physical Exam  Physical  Exam  Constitutional: No distress.  Neck: Neck supple.  Cardiovascular: Normal rate and regular rhythm.   Pulmonary/Chest: Breath sounds normal.  Abdominal: She exhibits no distension. There is no tenderness. There is no rebound.  Musculoskeletal: She exhibits no edema.  Skin:  Dry skin on the face  CBC    Component Value Date/Time   WBC 4.1 01/09/2014 0555   WBC 4.8 12/11/2013 0940   WBC 5.2 03/05/2013 1916   RBC 3.01* 01/09/2014 0555   RBC 2.62* 12/11/2013 0940   RBC 3.95* 03/05/2013 1916   RBC 2.90* 08/22/2012 0907   HGB 9.5* 01/09/2014 0555   HGB 8.5* 12/11/2013 0940   HGB 11.3* 03/05/2013 1916   HCT 29.0* 01/09/2014 0555   HCT 26.2* 12/11/2013 0940   HCT 37.5* 03/05/2013 1916   PLT 89* 01/09/2014 0555   PLT 51* 12/11/2013 0940   MCV 96.3 01/09/2014 0555   MCV 99.9 12/11/2013 0940   MCV 95.0 03/05/2013 1916   LYMPHSABS 0.4* 12/21/2013 2041   LYMPHSABS 0.4* 12/11/2013 0940   MONOABS 0.4 12/21/2013 2041   MONOABS 0.3 12/11/2013 0940   EOSABS 1.0* 12/21/2013 2041   EOSABS 2.5* 12/11/2013 0940   BASOSABS 0.0 12/21/2013 2041   BASOSABS 0.0 12/11/2013 0940    CMP     Component Value Date/Time   NA 135* 01/09/2014 0555   NA 129* 11/12/2013 1205   K 4.4 01/09/2014 0555   K 4.2 11/12/2013 1205   CL 96 01/09/2014 0555   CO2 22 01/09/2014 0555   CO2 25 11/12/2013 1205   GLUCOSE 78 01/09/2014 0555   GLUCOSE 169* 11/12/2013 1205   BUN 83* 01/09/2014 0555   BUN 95.4* 11/12/2013 1205   CREATININE 2.55* 01/09/2014 0555   CREATININE 2.2* 11/12/2013 1205   CREATININE 1.57* 01/27/2013 1315   CALCIUM 8.7 01/09/2014 0555   CALCIUM 8.8 11/12/2013 1205   PROT 7.1 01/08/2014 0615   PROT 7.7 11/12/2013 1205   ALBUMIN 2.3* 01/08/2014 0615   ALBUMIN 2.9* 11/12/2013 1205   AST 30 01/08/2014 0615   AST 24 11/12/2013 1205   ALT 6 01/08/2014 0615   ALT 22 11/12/2013 1205   ALKPHOS 149* 01/08/2014 0615   ALKPHOS 174* 11/12/2013 1205   BILITOT 0.8 01/08/2014 0615    BILITOT 1.14 11/12/2013 1205   GFRNONAA 19* 01/09/2014 0555   GFRAA 22* 01/09/2014 0555    No results found for: CHOL  No components found for: HGA1C  Lab Results  Component Value Date/Time   AST 30 01/08/2014 06:15 AM   AST 24 11/12/2013 12:05 PM    Assessment and Plan  Type 2 diabetes mellitus without complication - Plan: Results for orders placed or performed in visit on 01/22/14  POCT glycosylated hemoglobin (Hb A1C)  Result Value Ref Range   Hemoglobin A1C 5.4   POCT glucose (manual entry)  Result Value Ref Range   POC Glucose 139 (A) 70 - 99 mg/dl    A1c has improved,  she's not on any medications, diet controlled  POCT glucose (manual entry), COMPLETE METABOLIC PANEL WITH GFR  Chronic systolic congestive heart failure - Plan:currently patient is following up with the cardiologist, will repeat COMPLETE METABOLIC PANEL WITH GFR  Chronic kidney disease (CKD), stage IV (severe) - Plan: will repeatCOMPLETE METABOLIC PANEL WITH GFR  Pancytopenia Patient is advised to have follow up with oncology.  Itching - Plan: cetirizine (ZYRTEC) 10 MG tablet  Exfoliative dermatitis - Plan: Ambulatory referral to Dermatology  Bacteremia due to Pseudomonas Patient is about to finish the 2 week course of antibiotic, she is afebrile her vitals are stable.  Health Maintenance  -Vaccinations:  uptodate with flu shot and penumovax  Return in about 3 months (around 04/23/2014) for diabetes, hypertension.  Lorayne Marek, MD

## 2014-01-23 LAB — COMPLETE METABOLIC PANEL WITH GFR
ALK PHOS: 153 U/L — AB (ref 39–117)
ALT: 10 U/L (ref 0–35)
AST: 25 U/L (ref 0–37)
Albumin: 2.9 g/dL — ABNORMAL LOW (ref 3.5–5.2)
BUN: 76 mg/dL — ABNORMAL HIGH (ref 6–23)
CO2: 25 mEq/L (ref 19–32)
Calcium: 8.3 mg/dL — ABNORMAL LOW (ref 8.4–10.5)
Chloride: 98 mEq/L (ref 96–112)
Creat: 4.06 mg/dL — ABNORMAL HIGH (ref 0.50–1.10)
GFR, Est African American: 13 mL/min — ABNORMAL LOW
GFR, Est Non African American: 11 mL/min — ABNORMAL LOW
Glucose, Bld: 123 mg/dL — ABNORMAL HIGH (ref 70–99)
POTASSIUM: 4.2 meq/L (ref 3.5–5.3)
SODIUM: 137 meq/L (ref 135–145)
TOTAL PROTEIN: 7.1 g/dL (ref 6.0–8.3)
Total Bilirubin: 1 mg/dL (ref 0.2–1.2)

## 2014-01-27 ENCOUNTER — Telehealth: Payer: Self-pay | Admitting: Internal Medicine

## 2014-01-27 ENCOUNTER — Telehealth (HOSPITAL_COMMUNITY): Payer: Self-pay | Admitting: Vascular Surgery

## 2014-01-27 NOTE — Telephone Encounter (Signed)
Spoke w/[name] [name] gave ok to re-cert pt

## 2014-01-27 NOTE — Telephone Encounter (Signed)
[facility] called to get recert orders to continue to see pt for CHF.Marland Kitchen PLEASE ASDVISE

## 2014-01-27 NOTE — Telephone Encounter (Signed)
Nurse from [place] [facility] called to request an order to pull the picc line since antibiotics are finished for the patient, nurse states that they will still see the patient but need an order to remove picc line. Please f/u with nurse.

## 2014-01-28 ENCOUNTER — Telehealth: Payer: Self-pay | Admitting: Emergency Medicine

## 2014-01-28 ENCOUNTER — Telehealth: Payer: Self-pay | Admitting: Internal Medicine

## 2014-01-28 NOTE — Telephone Encounter (Signed)
If the Patient has completed the course of antibiotic, the PICC line can be removed.

## 2014-01-28 NOTE — Telephone Encounter (Signed)
Nurse from [place] called to request an order to pull the picc line since antibiotics are finished for the patient, nurse states that they will still see the patient but need an order to remove picc line, nurse stated that they are going to see the patient this after noon and they would like to hear from the doctor before then, please f/u.

## 2014-01-28 NOTE — Telephone Encounter (Signed)
Picc line clarified by [place], Pt has a central line and needs to be taken out by IR. I will call to schedule appointment with Radiology

## 2014-01-28 NOTE — Telephone Encounter (Signed)
Nurse from [place] called to request an order to pull the picc line since antibiotics are finished for the patient, nurse states that they will still see the patient but need an order to remove picc line, nurse stated that they are going to see the patient this after noon and they would like to hear from the doctor before then, please f/u.             Encounter MyChart Messages   Verbal order given to d/c PICC line

## 2014-01-28 NOTE — Telephone Encounter (Signed)
Left message for Interventional Radiology scheduler, Anderson Malta to schedule appointment to d/c [facility]

## 2014-01-30 ENCOUNTER — Telehealth: Payer: Self-pay | Admitting: Internal Medicine

## 2014-01-30 NOTE — Telephone Encounter (Signed)
Left 2nd message with Anderson Malta, [place] scheduler for [employer] removal

## 2014-01-30 NOTE — Telephone Encounter (Signed)
Nurse from [place] called to inquire on the appointment for the central picc line to be pulled on the patient, facility gave the appointment to the nurse that was scheduled for 02/18/2013 but she stated that the appointment was too far out and the patient needed to be seen before then. Nurse would like to speak to the patients PCP nurse, please f/u with nurse.

## 2014-02-13 ENCOUNTER — Telehealth (HOSPITAL_COMMUNITY): Payer: Self-pay | Admitting: Vascular Surgery

## 2014-02-13 ENCOUNTER — Encounter (HOSPITAL_COMMUNITY): Payer: Self-pay | Admitting: Internal Medicine

## 2014-02-13 ENCOUNTER — Ambulatory Visit (HOSPITAL_COMMUNITY)
Admission: RE | Admit: 2014-02-13 | Discharge: 2014-02-13 | Disposition: A | Discharge status: Home / Self Care | Payer: Medicaid Other | Source: Ambulatory Visit | Attending: Internal Medicine | Admitting: Internal Medicine

## 2014-02-13 ENCOUNTER — Other Ambulatory Visit (HOSPITAL_COMMUNITY): Payer: Self-pay | Admitting: Internal Medicine

## 2014-02-13 ENCOUNTER — Encounter (HOSPITAL_COMMUNITY): Payer: Self-pay | Admitting: Emergency Medicine

## 2014-02-13 ENCOUNTER — Emergency Department (HOSPITAL_COMMUNITY): Admission: EM | Admit: 2014-02-13 | Payer: Medicaid Other | Source: Home / Self Care

## 2014-02-13 DIAGNOSIS — Z4689 Encounter for fitting and adjustment of other specified devices: Secondary | ICD-10-CM | POA: Diagnosis not present

## 2014-02-13 DIAGNOSIS — B999 Unspecified infectious disease: Secondary | ICD-10-CM

## 2014-02-13 MED ORDER — LIDOCAINE HCL 1 % IJ SOLN
INTRAMUSCULAR | Status: AC
  Filled 2014-02-13: qty 20

## 2014-02-13 MED ORDER — CHLORHEXIDINE GLUCONATE 4 % EX LIQD
CUTANEOUS | Status: AC
  Filled 2014-02-13: qty 15

## 2014-02-13 NOTE — ED Notes (Addendum)
IV team called. Pt needs to be in bed for 30 mins after removal. RN to call radiology for it to be removed. Tunneled cathter. Radiology to removed. Radiology called.

## 2014-02-13 NOTE — ED Notes (Signed)
Interventional Radiology called and pt to go to them; no need to be seen in ER.

## 2014-02-13 NOTE — ED Notes (Signed)
Nurse from [facility]rs. [facility] instructed pt to come to ER for central line removal. Dr. Missy Sabins pt and was receiving antibiotics.

## 2014-02-13 NOTE — Telephone Encounter (Signed)
Refill Isosorbide, hydralazine, atorvastatin,metolazone, torsemide.. [facility] [address]

## 2014-02-17 MED ORDER — TORSEMIDE 20 MG PO TABS: 40.0000 mg | ORAL_TABLET | Freq: Every day | ORAL | Status: DC

## 2014-02-17 MED ORDER — METOLAZONE 2.5 MG PO TABS: 2.5000 mg | ORAL_TABLET | Freq: Every day | ORAL | Status: AC | PRN

## 2014-02-17 MED ORDER — HYDRALAZINE HCL 25 MG PO TABS: 50.0000 mg | ORAL_TABLET | Freq: Three times a day (TID) | ORAL | Status: AC

## 2014-02-17 MED ORDER — ISOSORBIDE MONONITRATE ER 30 MG PO TB24: 30.0000 mg | ORAL_TABLET | Freq: Every day | ORAL | Status: AC

## 2014-02-17 MED ORDER — ATORVASTATIN CALCIUM 10 MG PO TABS: 10.0000 mg | ORAL_TABLET | Freq: Every day | ORAL | Status: AC

## 2014-02-17 NOTE — Telephone Encounter (Signed)
Refills returned to pharmacy

## 2014-02-18 ENCOUNTER — Encounter (HOSPITAL_COMMUNITY): Payer: Self-pay

## 2014-02-18 ENCOUNTER — Ambulatory Visit (HOSPITAL_COMMUNITY)
Admission: RE | Admit: 2014-02-18 | Discharge: 2014-02-18 | Disposition: A | Discharge status: Home / Self Care | Payer: Medicaid Other | Source: Ambulatory Visit | Attending: Internal Medicine | Admitting: Internal Medicine

## 2014-02-18 VITALS — BP 118/72 | HR 87 | Wt 120.1 lb

## 2014-02-18 DIAGNOSIS — E119 Type 2 diabetes mellitus without complications: Secondary | ICD-10-CM | POA: Diagnosis not present

## 2014-02-18 DIAGNOSIS — I272 Other secondary pulmonary hypertension: Secondary | ICD-10-CM | POA: Diagnosis not present

## 2014-02-18 DIAGNOSIS — N184 Chronic kidney disease, stage 4 (severe): Secondary | ICD-10-CM | POA: Diagnosis not present

## 2014-02-18 DIAGNOSIS — I255 Ischemic cardiomyopathy: Secondary | ICD-10-CM | POA: Diagnosis not present

## 2014-02-18 DIAGNOSIS — I129 Hypertensive chronic kidney disease with stage 1 through stage 4 chronic kidney disease, or unspecified chronic kidney disease: Secondary | ICD-10-CM | POA: Insufficient documentation

## 2014-02-18 DIAGNOSIS — M109 Gout, unspecified: Secondary | ICD-10-CM | POA: Insufficient documentation

## 2014-02-18 DIAGNOSIS — I5022 Chronic systolic (congestive) heart failure: Secondary | ICD-10-CM | POA: Diagnosis present

## 2014-02-18 DIAGNOSIS — E8770 Fluid overload, unspecified: Secondary | ICD-10-CM | POA: Insufficient documentation

## 2014-02-18 DIAGNOSIS — Z79899 Other long term (current) drug therapy: Secondary | ICD-10-CM | POA: Diagnosis not present

## 2014-02-18 DIAGNOSIS — Z7982 Long term (current) use of aspirin: Secondary | ICD-10-CM | POA: Diagnosis not present

## 2014-02-18 DIAGNOSIS — R21 Rash and other nonspecific skin eruption: Secondary | ICD-10-CM | POA: Insufficient documentation

## 2014-02-18 DIAGNOSIS — I2510 Atherosclerotic heart disease of native coronary artery without angina pectoris: Secondary | ICD-10-CM | POA: Diagnosis not present

## 2014-02-18 LAB — BASIC METABOLIC PANEL
Anion gap: 12 (ref 5–15)
BUN: 86 mg/dL — AB (ref 6–23)
CO2: 23 mmol/L (ref 19–32)
CREATININE: 3.27 mg/dL — AB (ref 0.50–1.10)
Calcium: 8.2 mg/dL — ABNORMAL LOW (ref 8.4–10.5)
Chloride: 100 mEq/L (ref 96–112)
GFR calc Af Amer: 16 mL/min — ABNORMAL LOW (ref >90)
GFR calc non Af Amer: 14 mL/min — ABNORMAL LOW (ref >90)
GLUCOSE: 158 mg/dL — AB (ref 70–99)
POTASSIUM: 4 mmol/L (ref 3.5–5.1)
SODIUM: 135 mmol/L (ref 135–145)

## 2014-02-18 MED ORDER — TORSEMIDE 20 MG PO TABS: 60.0000 mg | ORAL_TABLET | Freq: Every day | ORAL | Status: DC

## 2014-02-18 MED ORDER — COLCHICINE 0.6 MG PO TABS: 0.3000 mg | ORAL_TABLET | Freq: Every day | ORAL | Status: AC

## 2014-02-18 NOTE — Progress Notes (Signed)
Patient ID: [name] [name], female   DOB: 1951/01/21, 64 y.o.   MRN: [id number] PCP:  Dr. Annitta Needs  HPI: 64 yo [name] with history of CAD and systolic HF due to ischemic cardiomyopathy with EF 30-35% and pulmonary venous hypertension, myelodysplastic syndrome, GI bleed, and CKD . LHC in 02/2013 demonstrated severe 2v CAD with 100% mRCA with L-R collats, diffuse 80-95% subtotal OM2 (small caliber diffusely diseased vessels - diabetic vessels) >> tx medically.  Admitted to [facility] 12/21/13 with hypothermia and volume overload and progressive renal failure. Urine culture revealed pseudomonas. Blood culture showed enterobacter Colacae.She will continue ceftazidime for 2 weeks. She required short term milrinone and IV lasix to assist with volume removal. Palliative care consulted and she requested aggressive care. [facility] was set up for home antibiotics. She also had diffuse exfoliative rash felt to be possible related to MDS. D/c weight 109 pounds  She returns for follow up with an interpreter and her son. Weight is higher on our scale but she does not want to take off her hat, coat, and gloves.  Weight has also been up by a few pounds at home.  She has not seen dermatology since last appointment (rash) but rash is resolving on its own.  After last appointment, creatinine was noted to have increased to 4.06 from 2.55 prior.  She was out of torsemide for 3-4 days until yesterday.  She took torsemide and metolazone this morning (uses metolazone prn weight > 118).  She walks with a walker and can get about 100 feet slowly before she is too winded to continue.  No chest pain, no syncope, no orthopnea/PND.  She has had a dry cough for a few days.  She also reports gout pain in her left had.  She is no longer taking allopurinol and wants to know if she can restart it.   Labs (12/15): K 4.2, creatinine 2.55 => 4.06  ROS: All systems negative except as listed in HPI, PMH and Problem List.  SH:  History   Social History   . Marital Status: Married    Spouse Name: N/A    Number of Children: N/A  . Years of Education: N/A   Occupational History  . Not on file.   Social History Main Topics  . Smoking status: Never Smoker   . Smokeless tobacco: Never Used  . Alcohol Use: No  . Drug Use: No  . Sexual Activity: Not on file   Other Topics Concern  . Not on file   Social History Narrative   ** Merged History Encounter **       Patient lives with husband and son. Currently without insurance and therefore tries not seek medical care dt financial concern.     FH:  Family History  Problem Relation Age of Onset  . Diabetes Brother     deceased in 06T dt DM complications    Past Medical History  Diagnosis Date  . Hypertension   . Pneumonia   . CHF (congestive heart failure) 08/2012  . Anemia 08/2012  . UTI (urinary tract infection) 08/22/2012  . Ischemic cardiomyopathy     EF 30-35%  . Coronary artery disease   . Pulmonary hypertension   . NSVT (nonsustained ventricular tachycardia) 02/2013  . Diabetes mellitus     Reportedly diagnosed 2011 but no medication initiated until 04/2011 (Metformin)  . Diabetic peripheral neuropathy     Since 2013  . Thrombocytopenia   . CKD (chronic kidney disease)   . Chronic  systolic CHF (congestive heart failure)     Echo (10/16/13):  EF 30% to 35%. Akinesis of inf-lat and inferior, anterior, anterolateral, and apical myocardium. Restrictive physiology.  Mild AI, MAC, mod MR, mild LAE, mild RVE, mild reduced RVSF, mild RAE, mod TR, Mod PI, PASP 79 mmHg, L pleural effusion.  Marland Kitchen Uncontrolled diabetes mellitus   . HTN (hypertension)   . Pulmonary HTN     a. Cath 02/2013: severe pulm HTN.  . CAD (coronary artery disease)     a. Cath 02/2013: severe 2V CAD - 100% mRCA with L-R collaterals; diffuse 80-95% subtotal OM2 (too diffusely diseased to attempt PCA), overall small caliber diffusely diseased coronaries c/w poorly controlled DM; treated medically.  . CKD (chronic  kidney disease), stage IV   . Ischemic cardiomyopathy   . Anemia     a. Prior normal EGD 03/2013. b. Progressive anemia 06/2013 in setting of transient hematemesis that had resolved.   . Thrombocytopenia     a. Liver imaging normal 03/2013.  . Edema     a. RUE edema with neg duplex for DVT 06/2013.  Marland Kitchen Protein calorie malnutrition   . Poor social situation   . Ascites     a. s/p paracentesis 03/2013.  . Valvular heart disease     a. Echo 06/2013: mild AI, mod MR, mod TR, mod PR.    Current Outpatient Prescriptions  Medication Sig Dispense Refill  . aspirin 81 MG EC tablet Take 1 tablet (81 mg total) by mouth daily. 90 tablet 3  . atorvastatin (LIPITOR) 10 MG tablet Take 1 tablet (10 mg total) by mouth daily at 6 PM. 30 tablet 2  . camphor-menthol (SARNA) lotion Apply topically as needed for itching. 222 mL 0  . cefTAZidime 1 g in dextrose 5 % 50 mL Inject 1 g into the vein daily. 14 g 0  . cetirizine (ZYRTEC) 10 MG tablet Take 1 tablet (10 mg total) by mouth daily. 30 tablet 3  . colchicine 0.6 MG tablet Take 0.5 tablets (0.3 mg total) by mouth daily. 15 tablet 3  . feeding supplement, GLUCERNA SHAKE, (GLUCERNA SHAKE) LIQD Take 237 mLs by mouth daily.  0  . glucose blood test strip Use as instructed 100 each 12  . glucose monitoring kit (FREESTYLE) monitoring kit 1 each by Does not apply route as needed for other. 1 each 0  . hydrALAZINE (APRESOLINE) 25 MG tablet Take 2 tablets (50 mg total) by mouth 3 (three) times daily. 180 tablet 2  . hydrOXYzine (ATARAX/VISTARIL) 25 MG tablet Take 1 tablet (25 mg total) by mouth every 6 (six) hours as needed for itching. 30 tablet 0  . isosorbide mononitrate (IMDUR) 30 MG 24 hr tablet Take 1 tablet (30 mg total) by mouth daily. 30 tablet 2  . Lancets (ONETOUCH ULTRASOFT) lancets Use as instructed 100 each 12  . metolazone (ZAROXOLYN) 2.5 MG tablet Take 1 tablet (2.5 mg total) by mouth daily as needed (for weight 118 pounds or greater.). 15 tablet 3  .  mirtazapine (REMERON) 15 MG tablet Take 1 tablet (15 mg total) by mouth at bedtime. 30 tablet 0  . torsemide (DEMADEX) 20 MG tablet Take 3 tablets (60 mg total) by mouth daily. 90 tablet 2   No current facility-administered medications for this encounter.    Filed Vitals:   02/18/14 1119  BP: 118/72  Pulse: 87  Weight: 120 lb 1.9 oz (54.486 kg)  SpO2: 96%    PHYSICAL EXAM: General: Chronically ill  appearing. No resp difficulty.  Son present. Arrived in a wheelchair.  Neck: supple. JVP 12, Carotids 2+ bilat; no bruits. No lymphadenopathy or thryomegaly appreciated. Cor: PMI nondisplaced. Regular rate & rhythm. No rubs,3/6 HSM LLSB. + S3.   Lungs: clear Abdomen: soft, nontender, non distended. No hepatosplenomegaly. No bruits or masses. Good bowel sounds. Extremities: no cyanosis, clubbing. 1+ ankle edema bilaterally.   Neuro: alert & orientedx3, cranial nerves grossly intact. moves all 4 extremities w/o difficulty. Affect pleasant Skin: Rash improved.    ASSESSMENT & PLAN: 1. Chronic Systolic Heart Failure: Ischemic cardiomyopathy. ECHO 30-35% with cardiorenal syndrome. NYHA IIIb symptoms. She is volume overloaded on exam, weight is up.  She was out of torsemide which likely triggered volume accumulation.  - Increase torsemide to 60 mg daily.  Will need to follow creatinine closely, check today and repeat BMET in 1 week.  - Continue current hydralazine/Imdur.  - She took metolazone today, would not take again until Saturday (if weight remains > 118 lbs at home).  - No ACEI or spironolactone due to CKD. No b-blocker due to low output 2. CKD Stage IV: Last creatinine was up to 4.  There is a component of cardiorenal syndrome.  As above, she is volume overloaded and I will need to increase torsemide.  - BMET today and in 1 week.  - I will refer to nephrology.  3. Rash: Was supposed to see dermatology but never did.  However, rash is improving.  4. CAD: Stable, no chest pain.   Continue ASA and statin.  5. Gout: Pain in left hand has been attributed to gout in the past.  Would not restart allopurinol during acute flare. I will have her take colchicine 0.3 mg daily until the pain resolves (renally dosed).   Followup in 1 week.   Dalton McLean,MD 02/18/2014

## 2014-02-18 NOTE — Patient Instructions (Signed)
INCREASE Torsemide to 60 mg daily HOLD Metolazone until Saturday 02/22/14, if your weight is above 118 lbs you may take Metolazone on Saturday START Colchicine 0.3mg  daily until pain is resolved  Labs today  You have been referred to [facility], they will contact you regarding a appointment  Your physician recommends that you schedule a follow-up appointment in: 1 week  Do the following things EVERYDAY: 1) Weigh yourself in the morning before breakfast. Write it down and keep it in a log. 2) Take your medicines as prescribed 3) Eat low salt foods-Limit salt (sodium) to 2000 mg per day.  4) Stay as active as you can everyday 5) Limit all fluids for the day to less than 2 liters 6)

## 2014-02-24 ENCOUNTER — Telehealth (HOSPITAL_COMMUNITY): Payer: Self-pay | Admitting: *Deleted

## 2014-02-24 MED ORDER — POTASSIUM CHLORIDE CRYS ER 20 MEQ PO TBCR: 20.0000 meq | EXTENDED_RELEASE_TABLET | ORAL | Status: AC | PRN

## 2014-02-24 NOTE — Telephone Encounter (Signed)
Maudie Mercury, RN with [facility] called concerned about pt's weight gain, she states last week pt weighed 118 lb, she did increase Torsemide to 60 mg daily and took Met on Sat as directed at [place] 1/12, however wt has continued to increase, pt does have slight edema and reports SOB with activity, discussed with Dr Haroldine Laws he would like pt to take Met for 3 days along with k-dur 20, Maudie Mercury is aware and will notify pt

## 2014-02-27 ENCOUNTER — Telehealth (HOSPITAL_COMMUNITY): Payer: Self-pay | Admitting: Vascular Surgery

## 2014-02-27 NOTE — Telephone Encounter (Signed)
Pt headed in right direction, she has f/u appt 1/26

## 2014-02-27 NOTE — Telephone Encounter (Signed)
[facility] update weight today 123.2 lbs edema siun ankles are down girth is down.Juluis Rainier

## 2014-02-28 ENCOUNTER — Encounter (HOSPITAL_COMMUNITY): Payer: Self-pay

## 2014-03-04 ENCOUNTER — Ambulatory Visit (HOSPITAL_COMMUNITY)
Admission: RE | Admit: 2014-03-04 | Discharge: 2014-03-04 | Disposition: A | Discharge status: Home / Self Care | Payer: Medicaid Other | Source: Ambulatory Visit | Attending: Cardiology | Admitting: Cardiology

## 2014-03-04 VITALS — BP 130/62 | HR 67 | Wt 117.4 lb

## 2014-03-04 DIAGNOSIS — I2510 Atherosclerotic heart disease of native coronary artery without angina pectoris: Secondary | ICD-10-CM

## 2014-03-04 DIAGNOSIS — E1342 Other specified diabetes mellitus with diabetic polyneuropathy: Secondary | ICD-10-CM | POA: Insufficient documentation

## 2014-03-04 DIAGNOSIS — N184 Chronic kidney disease, stage 4 (severe): Secondary | ICD-10-CM | POA: Diagnosis not present

## 2014-03-04 DIAGNOSIS — D469 Myelodysplastic syndrome, unspecified: Secondary | ICD-10-CM | POA: Insufficient documentation

## 2014-03-04 DIAGNOSIS — E1365 Other specified diabetes mellitus with hyperglycemia: Secondary | ICD-10-CM | POA: Diagnosis not present

## 2014-03-04 DIAGNOSIS — M109 Gout, unspecified: Secondary | ICD-10-CM | POA: Insufficient documentation

## 2014-03-04 DIAGNOSIS — I272 Other secondary pulmonary hypertension: Secondary | ICD-10-CM | POA: Diagnosis not present

## 2014-03-04 DIAGNOSIS — R21 Rash and other nonspecific skin eruption: Secondary | ICD-10-CM | POA: Diagnosis not present

## 2014-03-04 DIAGNOSIS — I5022 Chronic systolic (congestive) heart failure: Secondary | ICD-10-CM | POA: Diagnosis present

## 2014-03-04 DIAGNOSIS — I255 Ischemic cardiomyopathy: Secondary | ICD-10-CM | POA: Diagnosis not present

## 2014-03-04 DIAGNOSIS — I1310 Hypertensive heart and chronic kidney disease without heart failure, with stage 1 through stage 4 chronic kidney disease, or unspecified chronic kidney disease: Secondary | ICD-10-CM | POA: Insufficient documentation

## 2014-03-04 DIAGNOSIS — Z79899 Other long term (current) drug therapy: Secondary | ICD-10-CM | POA: Insufficient documentation

## 2014-03-04 DIAGNOSIS — Z7982 Long term (current) use of aspirin: Secondary | ICD-10-CM | POA: Insufficient documentation

## 2014-03-04 LAB — BASIC METABOLIC PANEL
Anion gap: 14 (ref 5–15)
BUN: 115 mg/dL — ABNORMAL HIGH (ref 6–23)
CHLORIDE: 96 mmol/L (ref 96–112)
CO2: 26 mmol/L (ref 19–32)
CREATININE: 2.95 mg/dL — AB (ref 0.50–1.10)
Calcium: 8.6 mg/dL (ref 8.4–10.5)
GFR calc Af Amer: 18 mL/min — ABNORMAL LOW (ref >90)
GFR calc non Af Amer: 16 mL/min — ABNORMAL LOW (ref >90)
Glucose, Bld: 145 mg/dL — ABNORMAL HIGH (ref 70–99)
Potassium: 3.8 mmol/L (ref 3.5–5.1)
Sodium: 136 mmol/L (ref 135–145)

## 2014-03-04 MED ORDER — TORSEMIDE 20 MG PO TABS: 80.0000 mg | ORAL_TABLET | Freq: Every day | ORAL | Status: AC

## 2014-03-04 MED ORDER — HYDROXYZINE HCL 25 MG PO TABS: 25.0000 mg | ORAL_TABLET | Freq: Four times a day (QID) | ORAL | Status: DC | PRN

## 2014-03-04 MED ORDER — ALLOPURINOL 100 MG PO TABS: 100.0000 mg | ORAL_TABLET | Freq: Every day | ORAL | Status: AC

## 2014-03-04 NOTE — Patient Instructions (Signed)
START Allopurinol 100mg  (1 tablet) once daily.  INCREASE Torsemide to 80mg  (4 tablets) once daily.  Will have [name] call you to schedule appointment.  Return next week for lab work.  Follow up 2 weeks.  Do the following things EVERYDAY: 1) Weigh yourself in the morning before breakfast. Write it down and keep it in a log. 2) Take your medicines as prescribed 3) Eat low salt foods-Limit salt (sodium) to 2000 mg per day.  4) Stay as active as you can everyday 5) Limit all fluids for the day to less than 2 liters

## 2014-03-05 NOTE — Progress Notes (Signed)
Patient ID: [name] [name], female   DOB: 10-26-1950, 64 y.o.   MRN: [id number] PCP:  Dr. Annitta Needs  HPI: 64 yo [name] with history of CAD and systolic HF due to ischemic cardiomyopathy with EF 30-35% and pulmonary venous hypertension, myelodysplastic syndrome, GI bleed, and CKD . LHC in 02/2013 demonstrated severe 2v CAD with 100% mRCA with L-R collats, diffuse 80-95% subtotal OM2 (small caliber diffusely diseased vessels - diabetic vessels) >> tx medically.  Admitted to [facility] 12/21/13 with hypothermia and volume overload and progressive renal failure. Urine culture revealed pseudomonas. Blood culture showed enterobacter Colacae.She will continue ceftazidime for 2 weeks. She required short term milrinone and IV lasix to assist with volume removal. Palliative care consulted and she requested aggressive care. [facility] was set up for home antibiotics. She also had diffuse exfoliative rash felt to be possible related to MDS. D/c weight 109 pounds  She returns for follow up with an interpreter and her son.  She walks with a walker and can get about 100 feet slowly before she is too winded to continue.  No chest pain, no syncope, no orthopnea/PND.  Weight is down 3 lbs on higher dose torsemide.  She still has an erythematous rash across her body with pruritis.  She is trying to get in to see a dermatologist in [place]. Gout pain in her left hand is improved on colchicine.   Labs (12/15): K 4.2, creatinine 2.55 => 4.06 Labs (1/16): K 4, creatinine 3.27  ROS: All systems negative except as listed in HPI, PMH and Problem List.  SH:  History   Social History  . Marital Status: Married    Spouse Name: N/A    Number of Children: N/A  . Years of Education: N/A   Occupational History  . Not on file.   Social History Main Topics  . Smoking status: Never Smoker   . Smokeless tobacco: Never Used  . Alcohol Use: No  . Drug Use: No  . Sexual Activity: Not on file   Other Topics Concern  . Not on file    Social History Narrative   ** Merged History Encounter **       Patient lives with husband and son. Currently without insurance and therefore tries not seek medical care dt financial concern.     FH:  Family History  Problem Relation Age of Onset  . Diabetes Brother     deceased in 38S dt DM complications    Past Medical History  Diagnosis Date  . Hypertension   . Pneumonia   . CHF (congestive heart failure) 08/2012  . Anemia 08/2012  . UTI (urinary tract infection) 08/22/2012  . Ischemic cardiomyopathy     EF 30-35%  . Coronary artery disease   . Pulmonary hypertension   . NSVT (nonsustained ventricular tachycardia) 02/2013  . Diabetes mellitus     Reportedly diagnosed 2011 but no medication initiated until 04/2011 (Metformin)  . Diabetic peripheral neuropathy     Since 2013  . Thrombocytopenia   . CKD (chronic kidney disease)   . Chronic systolic CHF (congestive heart failure)     Echo (10/16/13):  EF 30% to 35%. Akinesis of inf-lat and inferior, anterior, anterolateral, and apical myocardium. Restrictive physiology.  Mild AI, MAC, mod MR, mild LAE, mild RVE, mild reduced RVSF, mild RAE, mod TR, Mod PI, PASP 79 mmHg, L pleural effusion.  Marland Kitchen Uncontrolled diabetes mellitus   . HTN (hypertension)   . Pulmonary HTN     a. Cath  02/2013: severe pulm HTN.  . CAD (coronary artery disease)     a. Cath 02/2013: severe 2V CAD - 100% mRCA with L-R collaterals; diffuse 80-95% subtotal OM2 (too diffusely diseased to attempt PCA), overall small caliber diffusely diseased coronaries c/w poorly controlled DM; treated medically.  . CKD (chronic kidney disease), stage IV   . Ischemic cardiomyopathy   . Anemia     a. Prior normal EGD 03/2013. b. Progressive anemia 06/2013 in setting of transient hematemesis that had resolved.   . Thrombocytopenia     a. Liver imaging normal 03/2013.  . Edema     a. RUE edema with neg duplex for DVT 06/2013.  Marland Kitchen Protein calorie malnutrition   . Poor social  situation   . Ascites     a. s/p paracentesis 03/2013.  . Valvular heart disease     a. Echo 06/2013: mild AI, mod MR, mod TR, mod PR.    Current Outpatient Prescriptions  Medication Sig Dispense Refill  . aspirin 81 MG EC tablet Take 1 tablet (81 mg total) by mouth daily. 90 tablet 3  . atorvastatin (LIPITOR) 10 MG tablet Take 1 tablet (10 mg total) by mouth daily at 6 PM. 30 tablet 2  . camphor-menthol (SARNA) lotion Apply topically as needed for itching. 222 mL 0  . cetirizine (ZYRTEC) 10 MG tablet Take 1 tablet (10 mg total) by mouth daily. 30 tablet 3  . colchicine 0.6 MG tablet Take 0.5 tablets (0.3 mg total) by mouth daily. 15 tablet 3  . feeding supplement, GLUCERNA SHAKE, (GLUCERNA SHAKE) LIQD Take 237 mLs by mouth daily.  0  . glucose blood test strip Use as instructed 100 each 12  . glucose monitoring kit (FREESTYLE) monitoring kit 1 each by Does not apply route as needed for other. 1 each 0  . hydrALAZINE (APRESOLINE) 25 MG tablet Take 2 tablets (50 mg total) by mouth 3 (three) times daily. 180 tablet 2  . hydrOXYzine (ATARAX/VISTARIL) 25 MG tablet Take 1 tablet (25 mg total) by mouth every 6 (six) hours as needed for itching. 30 tablet 0  . isosorbide mononitrate (IMDUR) 30 MG 24 hr tablet Take 1 tablet (30 mg total) by mouth daily. 30 tablet 2  . Lancets (ONETOUCH ULTRASOFT) lancets Use as instructed 100 each 12  . metolazone (ZAROXOLYN) 2.5 MG tablet Take 1 tablet (2.5 mg total) by mouth daily as needed (for weight 118 pounds or greater.). 15 tablet 3  . potassium chloride SA (K-DUR,KLOR-CON) 20 MEQ tablet Take 1 tablet (20 mEq total) by mouth as needed. ONLY TAKE WHEN YOU TAKE METOLAZONE 30 tablet 3  . torsemide (DEMADEX) 20 MG tablet Take 4 tablets (80 mg total) by mouth daily. 120 tablet 3  . allopurinol (ZYLOPRIM) 100 MG tablet Take 1 tablet (100 mg total) by mouth daily. 90 tablet 3  . mirtazapine (REMERON) 15 MG tablet Take 1 tablet (15 mg total) by mouth at bedtime.  (Patient not taking: Reported on 03/04/2014) 30 tablet 0   No current facility-administered medications for this encounter.    Filed Vitals:   03/04/14 1534  BP: 130/62  Pulse: 67  Weight: 117 lb 6.4 oz (53.252 kg)  SpO2: 100%    PHYSICAL EXAM: General: Chronically ill appearing. No resp difficulty.  Son present. Arrived in a wheelchair.  Neck: supple. JVP 12-14, Carotids 2+ bilat; no bruits. No lymphadenopathy or thryomegaly appreciated. Cor: PMI nondisplaced. Regular rate & rhythm. No rubs,2/6 HSM LLSB. + S3.   Lungs:  clear Abdomen: soft, nontender, non distended. No hepatosplenomegaly. No bruits or masses. Good bowel sounds. Extremities: no cyanosis, clubbing. 1+ ankle edema bilaterally.   Neuro: alert & orientedx3, cranial nerves grossly intact. moves all 4 extremities w/o difficulty. Affect pleasant Skin: Erythematous scaly rash    ASSESSMENT & PLAN: 1. Chronic Systolic Heart Failure: Ischemic cardiomyopathy. ECHO 30-35% with cardiorenal syndrome. NYHA IIIb symptoms. She remains volume overloaded on exam though weight is down 3 lbs with higher torsemide dose.    - Increase torsemide to 80 mg daily.  Will need to follow creatinine closely, check today and repeat BMET in 1 week.  - Continue current hydralazine/Imdur.  - She has metolazone to use prn. - No ACEI or spironolactone due to CKD. No b-blocker due to low output 2. CKD Stage IV: There is a component of cardiorenal syndrome.  As above, she is volume overloaded and I will need to increase torsemide.  - BMET today and in 1 week.  - I will refer to nephrology.  3. Rash: She is working on getting an appointment to see dermatology in [place].  4. CAD: Stable, no chest pain.  Continue ASA and statin.  5. Gout: Improved with colchicine.  She can start allopurinol 100 mg daily today.   Followup in 2 wks  Theador Hawthorne 03/05/2014

## 2014-03-19 ENCOUNTER — Encounter (HOSPITAL_COMMUNITY): Payer: Self-pay

## 2014-03-19 ENCOUNTER — Ambulatory Visit (HOSPITAL_COMMUNITY)
Admission: RE | Admit: 2014-03-19 | Discharge: 2014-03-19 | Disposition: A | Discharge status: Home / Self Care | Payer: Medicaid Other | Source: Ambulatory Visit | Attending: Adult Health | Admitting: Adult Health

## 2014-03-19 VITALS — BP 113/61 | HR 86 | Resp 18 | Wt 118.2 lb

## 2014-03-19 DIAGNOSIS — M109 Gout, unspecified: Secondary | ICD-10-CM | POA: Insufficient documentation

## 2014-03-19 DIAGNOSIS — R21 Rash and other nonspecific skin eruption: Secondary | ICD-10-CM | POA: Insufficient documentation

## 2014-03-19 DIAGNOSIS — D469 Myelodysplastic syndrome, unspecified: Secondary | ICD-10-CM | POA: Diagnosis not present

## 2014-03-19 DIAGNOSIS — I5022 Chronic systolic (congestive) heart failure: Secondary | ICD-10-CM | POA: Diagnosis not present

## 2014-03-19 DIAGNOSIS — N184 Chronic kidney disease, stage 4 (severe): Secondary | ICD-10-CM | POA: Diagnosis not present

## 2014-03-19 DIAGNOSIS — I2510 Atherosclerotic heart disease of native coronary artery without angina pectoris: Secondary | ICD-10-CM | POA: Diagnosis not present

## 2014-03-19 DIAGNOSIS — D46Z Other myelodysplastic syndromes: Secondary | ICD-10-CM

## 2014-03-19 DIAGNOSIS — I5043 Acute on chronic combined systolic (congestive) and diastolic (congestive) heart failure: Secondary | ICD-10-CM

## 2014-03-19 LAB — BASIC METABOLIC PANEL
Anion gap: 12 (ref 5–15)
BUN: 114 mg/dL — ABNORMAL HIGH (ref 6–23)
CO2: 24 mmol/L (ref 19–32)
Calcium: 8.3 mg/dL — ABNORMAL LOW (ref 8.4–10.5)
Chloride: 101 mmol/L (ref 96–112)
Creatinine, Ser: 3.49 mg/dL — ABNORMAL HIGH (ref 0.50–1.10)
GFR calc non Af Amer: 13 mL/min — ABNORMAL LOW (ref >90)
GFR, EST AFRICAN AMERICAN: 15 mL/min — AB (ref >90)
GLUCOSE: 91 mg/dL (ref 70–99)
POTASSIUM: 3.7 mmol/L (ref 3.5–5.1)
SODIUM: 137 mmol/L (ref 135–145)

## 2014-03-19 LAB — CBC
HEMATOCRIT: 26.3 % — AB (ref 36.0–46.0)
HEMOGLOBIN: 8.7 g/dL — AB (ref 12.0–15.0)
MCH: 33.3 pg (ref 26.0–34.0)
MCHC: 33.1 g/dL (ref 30.0–36.0)
MCV: 100.8 fL — AB (ref 78.0–100.0)
Platelets: 62 10*3/uL — ABNORMAL LOW (ref 150–400)
RBC: 2.61 MIL/uL — AB (ref 3.87–5.11)
RDW: 16.7 % — AB (ref 11.5–15.5)
WBC: 3.4 10*3/uL — AB (ref 4.0–10.5)

## 2014-03-19 LAB — BRAIN NATRIURETIC PEPTIDE: B Natriuretic Peptide: 2335.9 pg/mL — ABNORMAL HIGH (ref 0.0–100.0)

## 2014-03-19 NOTE — Patient Instructions (Signed)
Labs today  Please follow back up with Dr Alen Blew  Your physician recommends that you schedule a follow-up appointment in: 2 weeks

## 2014-03-19 NOTE — Progress Notes (Signed)
Patient ID: [name] [name], female   DOB: 02-17-1950, 64 y.o.   MRN: [id number]  PCP:  Dr. Annitta Needs  HPI: 64 yo [name] with history of CAD and systolic HF due to ischemic cardiomyopathy with EF 30-35% and pulmonary venous hypertension, myelodysplastic syndrome, GI bleed, and CKD . LHC in 02/2013 demonstrated severe 2v CAD with 100% mRCA with L-R collats, diffuse 80-95% subtotal OM2 (small caliber diffusely diseased vessels - diabetic vessels) >> tx medically.  Admitted to [facility] 12/21/13 with hypothermia and volume overload and progressive renal failure. Urine culture revealed pseudomonas. Blood culture showed enterobacter Colacae.She will continue ceftazidime for 2 weeks. She required short term milrinone and IV lasix to assist with volume removal. Palliative care consulted and she requested aggressive care. [facility] was set up for home antibiotics. She also had diffuse exfoliative rash felt to be possible related to MDS. D/c weight 109 pounds  She returns for follow up with an interpreter and her son.  Overall feeling ok. Denies PND/Orthopnea. Dyspneic when she completes ADLs. Feeling weak. Weight at home 114-115 pounds. Appetite poor. Drinking> 2 liters. She still has an erythematous rash across her body with pruritis.  She is trying to get in to see a dermatologist in [place]. Son prepares medication.   Labs (12/15): K 4.2, creatinine 2.55 => 4.06 Labs (1/16): K 4, creatinine 3.27 Labs (03/04/14): K 3.8 Creatinine 2.95   ROS: All systems negative except as listed in HPI, PMH and Problem List.  SH:  History   Social History  . Marital Status: Married    Spouse Name: N/A  . Number of Children: N/A  . Years of Education: N/A   Occupational History  . Not on file.   Social History Main Topics  . Smoking status: Never Smoker   . Smokeless tobacco: Never Used  . Alcohol Use: No  . Drug Use: No  . Sexual Activity: Not on file   Other Topics Concern  . Not on file   Social History  Narrative   ** Merged History Encounter **       Patient lives with husband and son. Currently without insurance and therefore tries not seek medical care dt financial concern.     FH:  Family History  Problem Relation Age of Onset  . Diabetes Brother     deceased in 67Y dt DM complications    Past Medical History  Diagnosis Date  . Hypertension   . Pneumonia   . CHF (congestive heart failure) 08/2012  . Anemia 08/2012  . UTI (urinary tract infection) 08/22/2012  . Ischemic cardiomyopathy     EF 30-35%  . Coronary artery disease   . Pulmonary hypertension   . NSVT (nonsustained ventricular tachycardia) 02/2013  . Diabetes mellitus     Reportedly diagnosed 2011 but no medication initiated until 04/2011 (Metformin)  . Diabetic peripheral neuropathy     Since 2013  . Thrombocytopenia   . CKD (chronic kidney disease)   . Chronic systolic CHF (congestive heart failure)     Echo (10/16/13):  EF 30% to 35%. Akinesis of inf-lat and inferior, anterior, anterolateral, and apical myocardium. Restrictive physiology.  Mild AI, MAC, mod MR, mild LAE, mild RVE, mild reduced RVSF, mild RAE, mod TR, Mod PI, PASP 79 mmHg, L pleural effusion.  Marland Kitchen Uncontrolled diabetes mellitus   . HTN (hypertension)   . Pulmonary HTN     a. Cath 02/2013: severe pulm HTN.  . CAD (coronary artery disease)     a. Cath  02/2013: severe 2V CAD - 100% mRCA with L-R collaterals; diffuse 80-95% subtotal OM2 (too diffusely diseased to attempt PCA), overall small caliber diffusely diseased coronaries c/w poorly controlled DM; treated medically.  . CKD (chronic kidney disease), stage IV   . Ischemic cardiomyopathy   . Anemia     a. Prior normal EGD 03/2013. b. Progressive anemia 06/2013 in setting of transient hematemesis that had resolved.   . Thrombocytopenia     a. Liver imaging normal 03/2013.  . Edema     a. RUE edema with neg duplex for DVT 06/2013.  Marland Kitchen Protein calorie malnutrition   . Poor social situation   . Ascites      a. s/p paracentesis 03/2013.  . Valvular heart disease     a. Echo 06/2013: mild AI, mod MR, mod TR, mod PR.    Current Outpatient Prescriptions  Medication Sig Dispense Refill  . allopurinol (ZYLOPRIM) 100 MG tablet Take 1 tablet (100 mg total) by mouth daily. 90 tablet 3  . aspirin 81 MG EC tablet Take 1 tablet (81 mg total) by mouth daily. 90 tablet 3  . atorvastatin (LIPITOR) 10 MG tablet Take 1 tablet (10 mg total) by mouth daily at 6 PM. 30 tablet 2  . camphor-menthol (SARNA) lotion Apply topically as needed for itching. 222 mL 0  . cetirizine (ZYRTEC) 10 MG tablet Take 1 tablet (10 mg total) by mouth daily. 30 tablet 3  . colchicine 0.6 MG tablet Take 0.5 tablets (0.3 mg total) by mouth daily. 15 tablet 3  . feeding supplement, GLUCERNA SHAKE, (GLUCERNA SHAKE) LIQD Take 237 mLs by mouth daily.  0  . glucose blood test strip Use as instructed 100 each 12  . glucose monitoring kit (FREESTYLE) monitoring kit 1 each by Does not apply route as needed for other. 1 each 0  . hydrALAZINE (APRESOLINE) 25 MG tablet Take 2 tablets (50 mg total) by mouth 3 (three) times daily. 180 tablet 2  . hydrOXYzine (ATARAX/VISTARIL) 25 MG tablet Take 1 tablet (25 mg total) by mouth every 6 (six) hours as needed for itching. 30 tablet 0  . isosorbide mononitrate (IMDUR) 30 MG 24 hr tablet Take 1 tablet (30 mg total) by mouth daily. 30 tablet 2  . Lancets (ONETOUCH ULTRASOFT) lancets Use as instructed 100 each 12  . metolazone (ZAROXOLYN) 2.5 MG tablet Take 1 tablet (2.5 mg total) by mouth daily as needed (for weight 118 pounds or greater.). 15 tablet 3  . mirtazapine (REMERON) 15 MG tablet Take 1 tablet (15 mg total) by mouth at bedtime. 30 tablet 0  . potassium chloride SA (K-DUR,KLOR-CON) 20 MEQ tablet Take 1 tablet (20 mEq total) by mouth as needed. ONLY TAKE WHEN YOU TAKE METOLAZONE 30 tablet 3  . torsemide (DEMADEX) 20 MG tablet Take 4 tablets (80 mg total) by mouth daily. 120 tablet 3   No current  facility-administered medications for this encounter.    Filed Vitals:   03/19/14 0836  BP: 113/61  Pulse: 86  Resp: 18  Weight: 118 lb 4 oz (53.638 kg)  SpO2: 98%    PHYSICAL EXAM: General: Chronically ill appearing. No resp difficulty.  Son present. Arrived in a wheelchair.  Neck: supple. JVP 7-8 Carotids 2+ bilat; no bruits. No lymphadenopathy or thryomegaly appreciated. Cor: PMI nondisplaced. Regular rate & rhythm. No rubs,2/6 HSM LLSB. .   Lungs: clear Abdomen: soft, nontender, + distended. No hepatosplenomegaly. No bruits or masses. Good bowel sounds. Extremities: no cyanosis, clubbing. NO  edema bilaterally.   Neuro: alert & orientedx3, cranial nerves grossly intact. moves all 4 extremities w/o difficulty. Affect pleasant Skin: Erythematous scaly rash back and abdomen  ASSESSMENT & PLAN: 1. Chronic Systolic Heart Failure: Ischemic cardiomyopathy. ECHO 30-35% with cardiorenal syndrome. NYHA IIIb symptoms. Functional decline reported with suspected low output. May need RHC to see if she has low output. She is willing to consider.  - Continue torsemide to 80 mg daily.  Will need to follow creatinine closely,  - Continue current hydralazine/Imdur.  - She has metolazone to use prn. - No ACEI or spironolactone due to CKD. No b-blocker due to low output Check BMET, BNP, CBC.  2. CKD Stage IV: There is a component of cardiorenal syndrome.  As above, she is volume overloaded and I will need to increase torsemide.  Referred to  nephrology.  3. Rash: She is working on getting an appointment to see dermatology in [place].  4. CAD: Stable, no chest pain.  Continue ASA and statin.  5. Gout: Improved with colchicine.  Continue  allopurinol 100 mg daily today.  6. Myelodysplastic syndrome. Will need f/u at [facility]  Follow up in 2 weeks.   CLEGG,AMY, NP-C  03/19/2014   Patient seen and examined with Darrick Grinder, NP. We discussed all aspects of the encounter. I agree with the  assessment and plan as stated above.   Volume status ok but she has NYHA IIIB symptoms. Unclear if this is due to HF or her myelodysplastic syndrome. Long talk with her and her son through the interpreter. We discussed possibility of RHC and inotropic therapy. They want to think about it. Will check BMET and CBC. Refer back to [facility] for evaluation.   Total time personally spent 35 minutes. Over half that time spent discussing above.   Daniel Bensimhon,MD 1:12 PM

## 2014-04-03 ENCOUNTER — Ambulatory Visit (HOSPITAL_COMMUNITY)
Admission: RE | Admit: 2014-04-03 | Discharge: 2014-04-03 | Disposition: A | Discharge status: Home / Self Care | Payer: Medicaid Other | Source: Ambulatory Visit | Attending: Cardiology | Admitting: Cardiology

## 2014-04-03 VITALS — BP 110/58 | HR 86 | Wt 114.5 lb

## 2014-04-03 DIAGNOSIS — R21 Rash and other nonspecific skin eruption: Secondary | ICD-10-CM | POA: Diagnosis not present

## 2014-04-03 DIAGNOSIS — I2510 Atherosclerotic heart disease of native coronary artery without angina pectoris: Secondary | ICD-10-CM | POA: Diagnosis not present

## 2014-04-03 DIAGNOSIS — I5022 Chronic systolic (congestive) heart failure: Secondary | ICD-10-CM | POA: Diagnosis not present

## 2014-04-03 DIAGNOSIS — D469 Myelodysplastic syndrome, unspecified: Secondary | ICD-10-CM | POA: Insufficient documentation

## 2014-04-03 DIAGNOSIS — M109 Gout, unspecified: Secondary | ICD-10-CM | POA: Insufficient documentation

## 2014-04-03 DIAGNOSIS — N184 Chronic kidney disease, stage 4 (severe): Secondary | ICD-10-CM | POA: Diagnosis not present

## 2014-04-03 LAB — BASIC METABOLIC PANEL
Anion gap: 8 (ref 5–15)
BUN: 137 mg/dL — ABNORMAL HIGH (ref 6–23)
CALCIUM: 8.2 mg/dL — AB (ref 8.4–10.5)
CO2: 25 mmol/L (ref 19–32)
Chloride: 100 mmol/L (ref 96–112)
Creatinine, Ser: 3.95 mg/dL — ABNORMAL HIGH (ref 0.50–1.10)
GFR calc Af Amer: 13 mL/min — ABNORMAL LOW (ref >90)
GFR calc non Af Amer: 11 mL/min — ABNORMAL LOW (ref >90)
GLUCOSE: 95 mg/dL (ref 70–99)
Potassium: 3 mmol/L — ABNORMAL LOW (ref 3.5–5.1)
SODIUM: 133 mmol/L — AB (ref 135–145)

## 2014-04-03 NOTE — Patient Instructions (Signed)
Lab today  Your physician recommends that you schedule a follow-up appointment in: 6 weeks  

## 2014-04-03 NOTE — Addendum Note (Signed)
Encounter addended by: Scarlette Calico, RN on: 04/03/2014 10:04 AM<BR>     Documentation filed: [employer], Patient Instructions Section, Orders

## 2014-04-03 NOTE — Progress Notes (Signed)
Patient ID: [name] [name], female   DOB: 1951/01/03, 64 y.o.   MRN: [id number]  PCP:  Dr. Annitta Needs  HPI: 64 yo [name] with history of CAD and systolic HF due to ischemic cardiomyopathy with EF 30-35% and pulmonary venous hypertension, myelodysplastic syndrome, GI bleed, and CKD . LHC in 02/2013 demonstrated severe 2v CAD with 100% mRCA with L-R collats, diffuse 80-95% subtotal OM2 (small caliber diffusely diseased vessels - diabetic vessels) >> tx medically.  Admitted to [facility] 12/21/13 with hypothermia and volume overload and progressive renal failure. Urine culture revealed pseudomonas. Blood culture showed enterobacter Colacae.She will continue ceftazidime for 2 weeks. She required short term milrinone and IV lasix to assist with volume removal. Palliative care consulted and she requested aggressive care. [facility] was set up for home antibiotics. She also had diffuse exfoliative rash felt to be possible related to MDS. D/c weight 109 pounds  She returns for follow up with an interpreter and her son. We saw her several weeks. Volume status ok but she has NYHA IIIB symptoms. We discussed possibility of RHC and inotropic therapy. They wanted to think about it. She feels better. Breathing ok. Able to do ADLs. Weight down about 5 pounds. Son gives her metolazone if weight up to 118. No swelling. Having diarrhea. Feels it may be related to [place]. Son prepares medication. Due to see Dr. Alen Blew next week. Also scheduled to see dermatologist.    Labs (12/15): K 4.2, creatinine 2.55 => 4.06 Labs (1/16): K 4, creatinine 3.27 Labs (03/04/14): K 3.8 Creatinine 2.95  Labs 03/19/14: K 3.7 creatinine 3.49 BNP 2335  Hgb 8.7  ROS: All systems negative except as listed in HPI, PMH and Problem List.  SH:  History   Social History  . Marital Status: Married    Spouse Name: N/A  . Number of Children: N/A  . Years of Education: N/A   Occupational History  . Not on file.   Social History Main Topics  . Smoking  status: Never Smoker   . Smokeless tobacco: Never Used  . Alcohol Use: No  . Drug Use: No  . Sexual Activity: Not on file   Other Topics Concern  . Not on file   Social History Narrative   ** Merged History Encounter **       Patient lives with husband and son. Currently without insurance and therefore tries not seek medical care dt financial concern.     FH:  Family History  Problem Relation Age of Onset  . Diabetes Brother     deceased in 23N dt DM complications    Past Medical History  Diagnosis Date  . Hypertension   . Pneumonia   . CHF (congestive heart failure) 08/2012  . Anemia 08/2012  . UTI (urinary tract infection) 08/22/2012  . Ischemic cardiomyopathy     EF 30-35%  . Coronary artery disease   . Pulmonary hypertension   . NSVT (nonsustained ventricular tachycardia) 02/2013  . Diabetes mellitus     Reportedly diagnosed 2011 but no medication initiated until 04/2011 (Metformin)  . Diabetic peripheral neuropathy     Since 2013  . Thrombocytopenia   . CKD (chronic kidney disease)   . Chronic systolic CHF (congestive heart failure)     Echo (10/16/13):  EF 30% to 35%. Akinesis of inf-lat and inferior, anterior, anterolateral, and apical myocardium. Restrictive physiology.  Mild AI, MAC, mod MR, mild LAE, mild RVE, mild reduced RVSF, mild RAE, mod TR, Mod PI, PASP 79 mmHg, L  pleural effusion.  Marland Kitchen Uncontrolled diabetes mellitus   . HTN (hypertension)   . Pulmonary HTN     a. Cath 02/2013: severe pulm HTN.  . CAD (coronary artery disease)     a. Cath 02/2013: severe 2V CAD - 100% mRCA with L-R collaterals; diffuse 80-95% subtotal OM2 (too diffusely diseased to attempt PCA), overall small caliber diffusely diseased coronaries c/w poorly controlled DM; treated medically.  . CKD (chronic kidney disease), stage IV   . Ischemic cardiomyopathy   . Anemia     a. Prior normal EGD 03/2013. b. Progressive anemia 06/2013 in setting of transient hematemesis that had resolved.   .  Thrombocytopenia     a. Liver imaging normal 03/2013.  . Edema     a. RUE edema with neg duplex for DVT 06/2013.  Marland Kitchen Protein calorie malnutrition   . Poor social situation   . Ascites     a. s/p paracentesis 03/2013.  . Valvular heart disease     a. Echo 06/2013: mild AI, mod MR, mod TR, mod PR.    Current Outpatient Prescriptions  Medication Sig Dispense Refill  . allopurinol (ZYLOPRIM) 100 MG tablet Take 1 tablet (100 mg total) by mouth daily. 90 tablet 3  . aspirin 81 MG EC tablet Take 1 tablet (81 mg total) by mouth daily. 90 tablet 3  . atorvastatin (LIPITOR) 10 MG tablet Take 1 tablet (10 mg total) by mouth daily at 6 PM. 30 tablet 2  . camphor-menthol (SARNA) lotion Apply topically as needed for itching. 222 mL 0  . cetirizine (ZYRTEC) 10 MG tablet Take 1 tablet (10 mg total) by mouth daily. 30 tablet 3  . colchicine 0.6 MG tablet Take 0.5 tablets (0.3 mg total) by mouth daily. 15 tablet 3  . feeding supplement, GLUCERNA SHAKE, (GLUCERNA SHAKE) LIQD Take 237 mLs by mouth daily.  0  . glucose blood test strip Use as instructed 100 each 12  . glucose monitoring kit (FREESTYLE) monitoring kit 1 each by Does not apply route as needed for other. 1 each 0  . hydrALAZINE (APRESOLINE) 25 MG tablet Take 2 tablets (50 mg total) by mouth 3 (three) times daily. 180 tablet 2  . hydrOXYzine (ATARAX/VISTARIL) 25 MG tablet Take 1 tablet (25 mg total) by mouth every 6 (six) hours as needed for itching. 30 tablet 0  . isosorbide mononitrate (IMDUR) 30 MG 24 hr tablet Take 1 tablet (30 mg total) by mouth daily. 30 tablet 2  . Lancets (ONETOUCH ULTRASOFT) lancets Use as instructed 100 each 12  . metolazone (ZAROXOLYN) 2.5 MG tablet Take 1 tablet (2.5 mg total) by mouth daily as needed (for weight 118 pounds or greater.). 15 tablet 3  . mirtazapine (REMERON) 15 MG tablet Take 1 tablet (15 mg total) by mouth at bedtime. 30 tablet 0  . potassium chloride SA (K-DUR,KLOR-CON) 20 MEQ tablet Take 1 tablet (20  mEq total) by mouth as needed. ONLY TAKE WHEN YOU TAKE METOLAZONE 30 tablet 3  . torsemide (DEMADEX) 20 MG tablet Take 4 tablets (80 mg total) by mouth daily. 120 tablet 3   No current facility-administered medications for this encounter.    Filed Vitals:   04/03/14 0924  BP: 110/58  Pulse: 86  Weight: 114 lb 8 oz (51.937 kg)  SpO2: 96%    PHYSICAL EXAM: General: No resp difficulty.  Son present. Arrived in a wheelchair.  Neck: supple. JVP 9 + prominent CV waves  Carotids 2+ bilat; no bruits. No lymphadenopathy  or thryomegaly appreciated. Cor: PMI nondisplaced. Regular rate & rhythm. No rubs,2/6 HSM LLSB. .   Lungs: clear Abdomen: soft, nontender, + mildly distended. No hepatosplenomegaly. No bruits or masses. Good bowel sounds. Extremities: no cyanosis, clubbing. NO edema bilaterally.   Neuro: alert & orientedx3, cranial nerves grossly intact. moves all 4 extremities w/o difficulty. Affect pleasant Skin: Erythematous scaly rash back and abdomen and face  ASSESSMENT & PLAN: 1. Chronic Systolic Heart Failure: Ischemic cardiomyopathy. ECHO 30-35% with cardiorenal syndrome. NYHA III-IIIb symptoms.  - She is somewhat better today then last time. Mildly fluid overloaded.  - Continue torsemide to 80 mg daily.  With metolazone as needed for weight 117 or greater. Will need to follow creatinine closely. Hold off on intoropes for now.  - Continue current hydralazine/Imdur.  - No ACEI or spironolactone due to CKD.  Check BMET today. 2. CKD Stage IV: There is a component of cardiorenal syndrome.  Check BMET today.  Referred to  nephrology.  3. Rash: She is working on getting an appointment to see dermatology in [place].  4. CAD: Stable, no chest pain.  Continue ASA and statin.  5. Gout: Improved with colchicine.  Continue  allopurinol 100 mg daily today.  6. Myelodysplastic syndrome. Has f/u at [facility] Yoel Kaufhold,MD 9:46 AM

## 2014-04-04 ENCOUNTER — Encounter: Payer: Self-pay | Admitting: Internal Medicine

## 2014-04-07 ENCOUNTER — Other Ambulatory Visit: Payer: Self-pay | Admitting: Cardiology

## 2014-04-10 ENCOUNTER — Other Ambulatory Visit: Payer: Self-pay | Admitting: Hematology and Oncology

## 2014-04-10 ENCOUNTER — Other Ambulatory Visit (HOSPITAL_BASED_OUTPATIENT_CLINIC_OR_DEPARTMENT_OTHER): Payer: Medicaid Other

## 2014-04-10 ENCOUNTER — Telehealth: Payer: Self-pay | Admitting: Hematology and Oncology

## 2014-04-10 ENCOUNTER — Encounter: Payer: Self-pay | Admitting: Hematology and Oncology

## 2014-04-10 ENCOUNTER — Ambulatory Visit (HOSPITAL_BASED_OUTPATIENT_CLINIC_OR_DEPARTMENT_OTHER): Payer: Medicaid Other | Admitting: Hematology and Oncology

## 2014-04-10 VITALS — BP 125/60 | HR 62 | Ht 60.0 in | Wt 112.7 lb

## 2014-04-10 DIAGNOSIS — N184 Chronic kidney disease, stage 4 (severe): Secondary | ICD-10-CM

## 2014-04-10 DIAGNOSIS — D61818 Other pancytopenia: Secondary | ICD-10-CM

## 2014-04-10 DIAGNOSIS — I5022 Chronic systolic (congestive) heart failure: Secondary | ICD-10-CM

## 2014-04-10 LAB — CBC WITH DIFFERENTIAL/PLATELET
BASO%: 1.1 % (ref 0.0–2.0)
BASOS ABS: 0 10*3/uL (ref 0.0–0.1)
EOS%: 10.7 % — ABNORMAL HIGH (ref 0.0–7.0)
Eosinophils Absolute: 0.3 10*3/uL (ref 0.0–0.5)
HEMATOCRIT: 25.6 % — AB (ref 34.8–46.6)
HEMOGLOBIN: 8.5 g/dL — AB (ref 11.6–15.9)
LYMPH#: 0.5 10*3/uL — AB (ref 0.9–3.3)
LYMPH%: 19.3 % (ref 14.0–49.7)
MCH: 32.7 pg (ref 25.1–34.0)
MCHC: 33.2 g/dL (ref 31.5–36.0)
MCV: 98.5 fL (ref 79.5–101.0)
MONO#: 0.3 10*3/uL (ref 0.1–0.9)
MONO%: 9.3 % (ref 0.0–14.0)
NEUT%: 59.6 % (ref 38.4–76.8)
NEUTROS ABS: 1.6 10*3/uL (ref 1.5–6.5)
Platelets: 45 10*3/uL — ABNORMAL LOW (ref 145–400)
RBC: 2.6 10*6/uL — AB (ref 3.70–5.45)
RDW: 16 % — AB (ref 11.2–14.5)
WBC: 2.7 10*3/uL — ABNORMAL LOW (ref 3.9–10.3)

## 2014-04-10 LAB — HOLD TUBE, BLOOD BANK

## 2014-04-10 NOTE — Assessment & Plan Note (Signed)
I suspect she may have some high-grade myelodysplastic syndrome. The high percentage of eosinophils count is highly suspicious, along with her abnormal peripheral smear and multiple co-morbidities, her prognosis is not good. I discussed with her and her son the risk and benefit of performing bone marrow biopsy. Even though that might give [place] a conclusive diagnosis, due to her significant co-morbidities and poor baseline performance status, I would not recommend any form of systemic treatment. I recommend consideration for hospice but I do not believe the patient and her family members were ready. She is on maximum medical management to get her kidney function and heart function under control. I think this is reasonable to just observe and revisit the issue again in another 6 months. In the meantime, I recommend transfusion support only as needed. If her hemoglobin is less than 8 g, she will get 1 unit of blood transfusion. If her platelet count is less than 10,000 or if she has clinical signs of bleeding, she will get 1 unit of platelets. There is no contraindication for her to remain on antiplatelet agents. The patient will need irradiated blood products.

## 2014-04-10 NOTE — Progress Notes (Signed)
[name] [name] OFFICE PROGRESS NOTE  Patient Care Team: Lorayne Marek, MD as PCP - General (Internal Medicine) Lorayne Marek, MD as Referring Physician (Internal Medicine) Heath Lark, MD as Consulting Physician (Hematology and Oncology) Jolaine Artist, MD as Consulting Physician (Cardiology)  SUMMARY OF ONCOLOGIC HISTORY: The patient is a poor historian. History was obtained through and interpreter and information mainly was supplied by her son.  She was found to have abnormal CBC from blood test monitoring. I reviewed her CBC all the way to 2013 which were within normal limits. 08/22/2012, she has new onset of anemia.  Starting in 2015, she started to have thrombocytopenia.  Starting September 2015, she has new onset of leukopenia She denies recent chest pain on exertion, shortness of breath on minimal exertion, pre-syncopal episodes, or palpitations. She is almost wheelchair-bound due to recent congestive heart failure. She has recurrent hospitalization for infection and congestive heart failure She had not noticed any recent bleeding such as epistaxis, hematuria or hematochezia  INTERVAL HISTORY: Please see below for problem oriented charting. She complained of fatigue. She bruises easily.  REVIEW OF SYSTEMS:   Constitutional: Denies fevers, chills or abnormal weight loss Eyes: Denies blurriness of vision Ears, nose, mouth, throat, and face: Denies mucositis or sore throat Respiratory: Denies cough, dyspnea or wheezes Cardiovascular: Denies palpitation, chest discomfort or lower extremity swelling Gastrointestinal:  Denies nausea, heartburn or change in bowel habits Skin: Denies abnormal skin rashes Lymphatics: Denies new lymphadenopathy  Neurological:Denies numbness, tingling or new weaknesses Behavioral/Psych: Mood is stable, no new changes  All other systems were reviewed with the patient and are negative.  I have reviewed the past medical history, past  surgical history, social history and family history with the patient and they are unchanged from previous note.  ALLERGIES:  has No Known Allergies.  MEDICATIONS:  Current Outpatient Prescriptions  Medication Sig Dispense Refill  . allopurinol (ZYLOPRIM) 100 MG tablet Take 1 tablet (100 mg total) by mouth daily. 90 tablet 3  . aspirin 81 MG EC tablet Take 1 tablet (81 mg total) by mouth daily. 90 tablet 3  . atorvastatin (LIPITOR) 10 MG tablet Take 1 tablet (10 mg total) by mouth daily at 6 PM. 30 tablet 2  . cetirizine (ZYRTEC) 10 MG tablet Take 1 tablet (10 mg total) by mouth daily. 30 tablet 3  . colchicine 0.6 MG tablet Take 0.5 tablets (0.3 mg total) by mouth daily. 15 tablet 3  . feeding supplement, GLUCERNA SHAKE, (GLUCERNA SHAKE) LIQD Take 237 mLs by mouth daily.  0  . glucose blood test strip Use as instructed 100 each 12  . glucose monitoring kit (FREESTYLE) monitoring kit 1 each by Does not apply route as needed for other. 1 each 0  . hydrALAZINE (APRESOLINE) 25 MG tablet Take 2 tablets (50 mg total) by mouth 3 (three) times daily. 180 tablet 2  . hydrOXYzine (ATARAX/VISTARIL) 25 MG tablet TAKE 1 TABLET (25 MG TOTAL) BY MOUTH EVERY 6 (SIX) HOURS AS NEEDED FOR ITCHING. 30 tablet 0  . isosorbide mononitrate (IMDUR) 30 MG 24 hr tablet Take 1 tablet (30 mg total) by mouth daily. 30 tablet 2  . Lancets (ONETOUCH ULTRASOFT) lancets Use as instructed 100 each 12  . metolazone (ZAROXOLYN) 2.5 MG tablet Take 1 tablet (2.5 mg total) by mouth daily as needed (for weight 118 pounds or greater.). 15 tablet 3  . mirtazapine (REMERON) 15 MG tablet Take 1 tablet (15 mg total) by mouth at bedtime. 30 tablet  0  . potassium chloride SA (K-DUR,KLOR-CON) 20 MEQ tablet Take 1 tablet (20 mEq total) by mouth as needed. ONLY TAKE WHEN YOU TAKE METOLAZONE 30 tablet 3  . torsemide (DEMADEX) 20 MG tablet Take 4 tablets (80 mg total) by mouth daily. 120 tablet 3   No current facility-administered medications  for this visit.    PHYSICAL EXAMINATION: ECOG PERFORMANCE STATUS: 3 - Symptomatic, >50% confined to bed  Filed Vitals:   04/10/14 1215  BP: 125/60  Pulse: 62   Filed Weights   04/10/14 1215  Weight: 112 lb 11.2 oz (51.12 kg)    GENERAL:alert, no distress and comfortable. She is sitting on the wheelchair, appears frail SKIN: skin color, texture, turgor are normal, no rashes or significant lesions EYES: normal, Conjunctiva are pale and non-injected, sclera clear OROPHARYNX:no exudate, no erythema and lips, buccal mucosa, and tongue normal  NECK: supple, thyroid normal size, non-tender, without nodularity LYMPH:  no palpable lymphadenopathy in the cervical, axillary or inguinal LUNGS: clear to auscultation and percussion with normal breathing effort HEART: Irregular heart rate and rhythm with loud systolic murmur, and no lower extremity edema ABDOMEN:abdomen soft, non-tender and normal bowel sounds Musculoskeletal:no cyanosis of digits and no clubbing  NEURO: alert & oriented x 3 with fluent speech, no focal motor/sensory deficits  LABORATORY DATA:  I have reviewed the data as listed    Component Value Date/Time   NA 133* 04/03/2014 1000   NA 129* 11/12/2013 1205   K 3.0* 04/03/2014 1000   K 4.2 11/12/2013 1205   CL 100 04/03/2014 1000   CO2 25 04/03/2014 1000   CO2 25 11/12/2013 1205   GLUCOSE 95 04/03/2014 1000   GLUCOSE 169* 11/12/2013 1205   BUN 137* 04/03/2014 1000   BUN 95.4* 11/12/2013 1205   CREATININE 3.95* 04/03/2014 1000   CREATININE 4.06* 01/22/2014 1711   CREATININE 2.2* 11/12/2013 1205   CALCIUM 8.2* 04/03/2014 1000   CALCIUM 8.8 11/12/2013 1205   PROT 7.1 01/22/2014 1711   PROT 7.7 11/12/2013 1205   ALBUMIN 2.9* 01/22/2014 1711   ALBUMIN 2.9* 11/12/2013 1205   AST 25 01/22/2014 1711   AST 24 11/12/2013 1205   ALT 10 01/22/2014 1711   ALT 22 11/12/2013 1205   ALKPHOS 153* 01/22/2014 1711   ALKPHOS 174* 11/12/2013 1205   BILITOT 1.0 01/22/2014 1711    BILITOT 1.14 11/12/2013 1205   GFRNONAA 11* 04/03/2014 1000   GFRNONAA 11* 01/22/2014 1711   GFRAA 13* 04/03/2014 1000   GFRAA 13* 01/22/2014 1711    No results found for: SPEP, UPEP  Lab Results  Component Value Date   WBC 2.7* 04/10/2014   NEUTROABS 1.6 04/10/2014   HGB 8.5* 04/10/2014   HCT 25.6* 04/10/2014   MCV 98.5 04/10/2014   PLT 45* 04/10/2014      Chemistry      Component Value Date/Time   NA 133* 04/03/2014 1000   NA 129* 11/12/2013 1205   K 3.0* 04/03/2014 1000   K 4.2 11/12/2013 1205   CL 100 04/03/2014 1000   CO2 25 04/03/2014 1000   CO2 25 11/12/2013 1205   BUN 137* 04/03/2014 1000   BUN 95.4* 11/12/2013 1205   CREATININE 3.95* 04/03/2014 1000   CREATININE 4.06* 01/22/2014 1711   CREATININE 2.2* 11/12/2013 1205      Component Value Date/Time   CALCIUM 8.2* 04/03/2014 1000   CALCIUM 8.8 11/12/2013 1205   ALKPHOS 153* 01/22/2014 1711   ALKPHOS 174* 11/12/2013 1205   AST  25 01/22/2014 1711   AST 24 11/12/2013 1205   ALT 10 01/22/2014 1711   ALT 22 11/12/2013 1205   BILITOT 1.0 01/22/2014 1711   BILITOT 1.14 11/12/2013 1205       ASSESSMENT & PLAN:  Pancytopenia I suspect she may have some high-grade myelodysplastic syndrome. The high percentage of eosinophils count is highly suspicious, along with her abnormal peripheral smear and multiple co-morbidities, her prognosis is not good. I discussed with her and her son the risk and benefit of performing bone marrow biopsy. Even though that might give [place] a conclusive diagnosis, due to her significant co-morbidities and poor baseline performance status, I would not recommend any form of systemic treatment. I recommend consideration for hospice but I do not believe the patient and her family members were ready. She is on maximum medical management to get her kidney function and heart function under control. I think this is reasonable to just observe and revisit the issue again in another 6 months. In  the meantime, I recommend transfusion support only as needed. If her hemoglobin is less than 8 g, she will get 1 unit of blood transfusion. If her platelet count is less than 10,000 or if she has clinical signs of bleeding, she will get 1 unit of platelets. There is no contraindication for her to remain on antiplatelet agents. The patient will need irradiated blood products.   Chronic kidney disease (CKD), stage IV (severe) This is severe. Given her severe comorbidities, I doubt she is a dialysis candidate. I would defer to her nephrologist for management   Chronic systolic heart failure Clinically, she appears euvolemic    No orders of the defined types were placed in this encounter.   All questions were answered. The patient knows to call the clinic with any problems, questions or concerns. No barriers to learning was detected. I spent 15 minutes counseling the patient face to face. The total time spent in the appointment was 20 minutes and more than 50% was on counseling and review of test results     [facility], [place], MD 04/10/2014 2:25 PM

## 2014-04-10 NOTE — Assessment & Plan Note (Signed)
Clinically, she appears euvolemic

## 2014-04-10 NOTE — Assessment & Plan Note (Signed)
This is severe. Given her severe comorbidities, I doubt she is a dialysis candidate. I would defer to her nephrologist for management

## 2014-04-10 NOTE — Telephone Encounter (Signed)
gv and rpinted appt sched and avs for pt for Sept

## 2014-05-06 ENCOUNTER — Telehealth: Payer: Self-pay | Admitting: Internal Medicine

## 2014-05-06 NOTE — Telephone Encounter (Signed)
 Call received on  at 19:22.

## 2014-05-06 NOTE — Telephone Encounter (Signed)
Received call from On call nurse who then transferred me over to Paramedic Rozetta Nunnery. Paramedic alerted me that patient passed away while in the home. Family last saw patient well at 8 am and when they returned home around 5 pm she was found in the prone position. EMS called and performed CPR and gave patient dextrose 50 for a low blood sugar. They were ultimately unsuccessful in any attempts to regain a pulse on patient and pronounced death around 18:13. I explained to EMS that they may send death certificate to [place] tomorrow to be signed by patients PCP.

## 2014-05-08 ENCOUNTER — Telehealth: Payer: Self-pay | Admitting: Emergency Medicine

## 2014-05-08 NOTE — Telephone Encounter (Signed)
Death Certificate Hand Delivered to Dr Annitta Needs for follow-up

## 2014-05-09 DEATH — deceased

## 2014-07-18 ENCOUNTER — Encounter: Payer: Self-pay | Admitting: *Deleted

## 2014-07-23 ENCOUNTER — Encounter: Payer: Self-pay | Admitting: *Deleted

## 2014-09-23 ENCOUNTER — Encounter: Payer: Self-pay | Admitting: Pharmacist

## 2014-10-14 ENCOUNTER — Other Ambulatory Visit: Payer: Self-pay

## 2014-10-14 ENCOUNTER — Ambulatory Visit: Payer: Self-pay | Admitting: Hematology and Oncology

## 2015-04-11 IMAGING — CR DG CHEST 2V
2 series · 2 of 2 positions shown · non-contrast
Comparison: October 27, 2013

CLINICAL DATA: Difficulty breathing

EXAM:
CHEST  2 VIEW

[w chest pa]
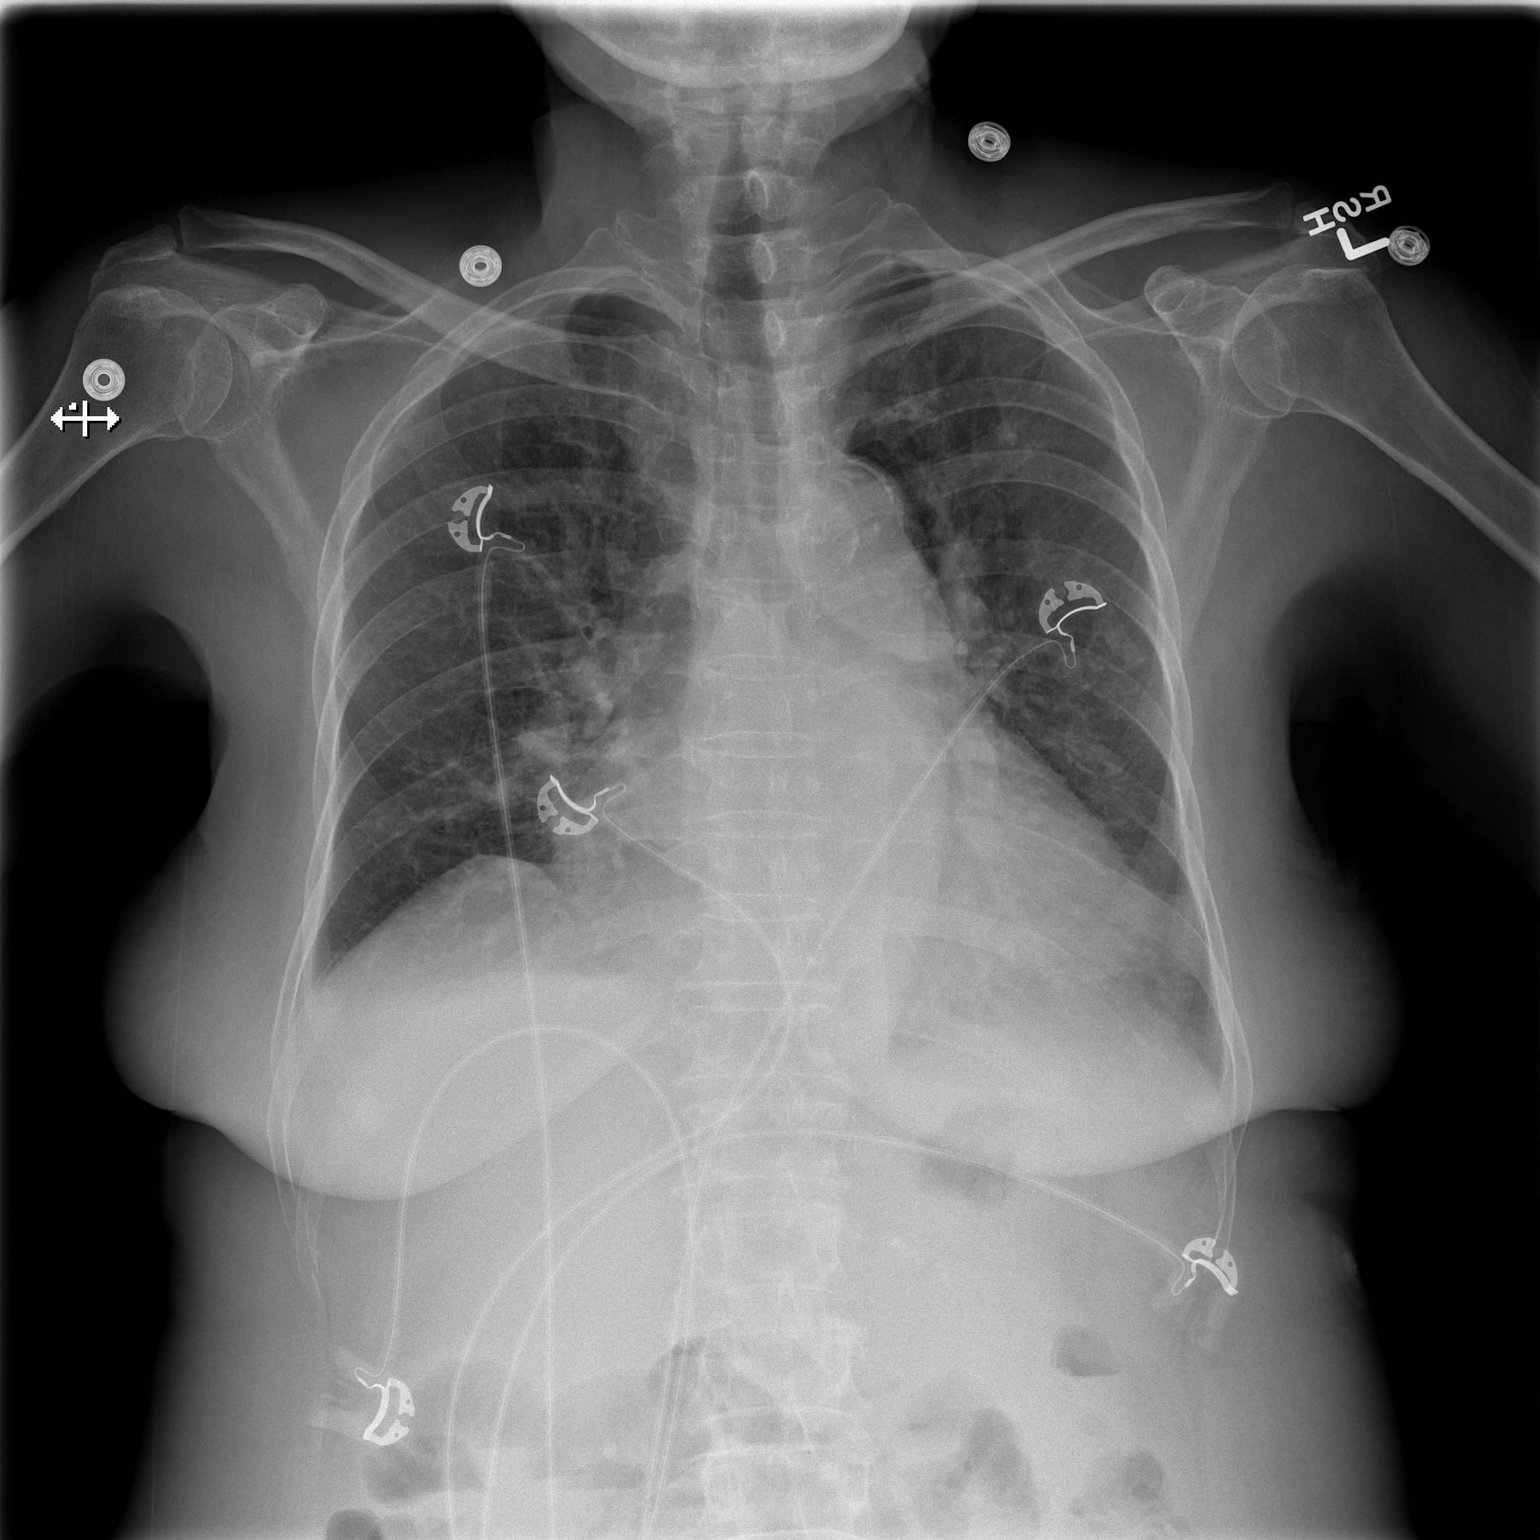

[w chest lat]
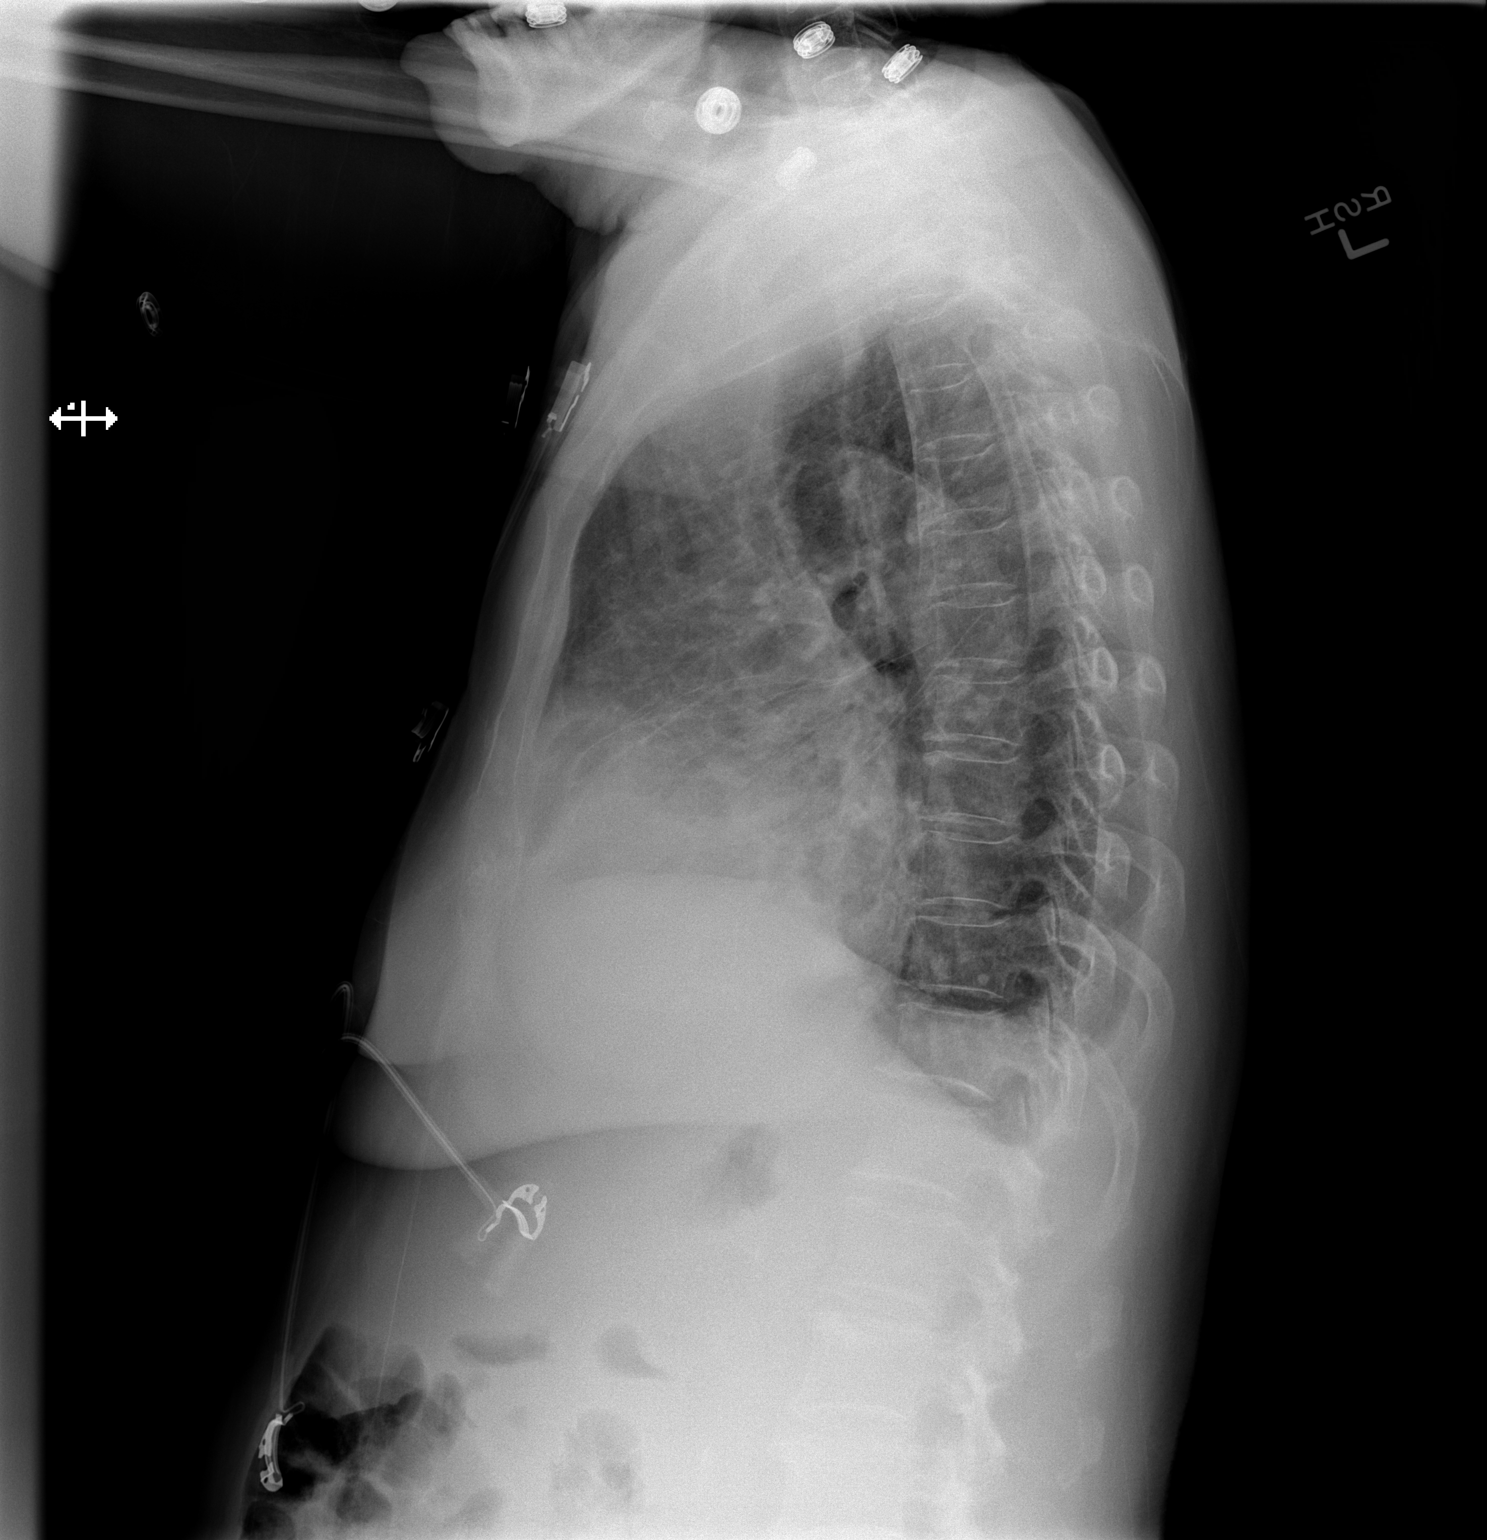

[2 of 2 positions shown; findings below may reference images not displayed]

FINDINGS: Currently there is no appreciable edema or consolidation. There is a
small granuloma in the left upper lobe. Heart is enlarged with mild
pulmonary venous hypertension. No adenopathy. There is
atherosclerotic change in aorta. No bone lesions.
IMPRESSION: Persistent volume overload without frank edema or consolidation.
Small granuloma left upper lobe.

## 2015-06-02 IMAGING — CR DG CHEST 1V PORT
1 series · 1 of 1 positions shown · non-contrast
Comparison: 10/30/2013

CLINICAL DATA: Shortness of breath

EXAM:
PORTABLE CHEST - 1 VIEW

[AP]
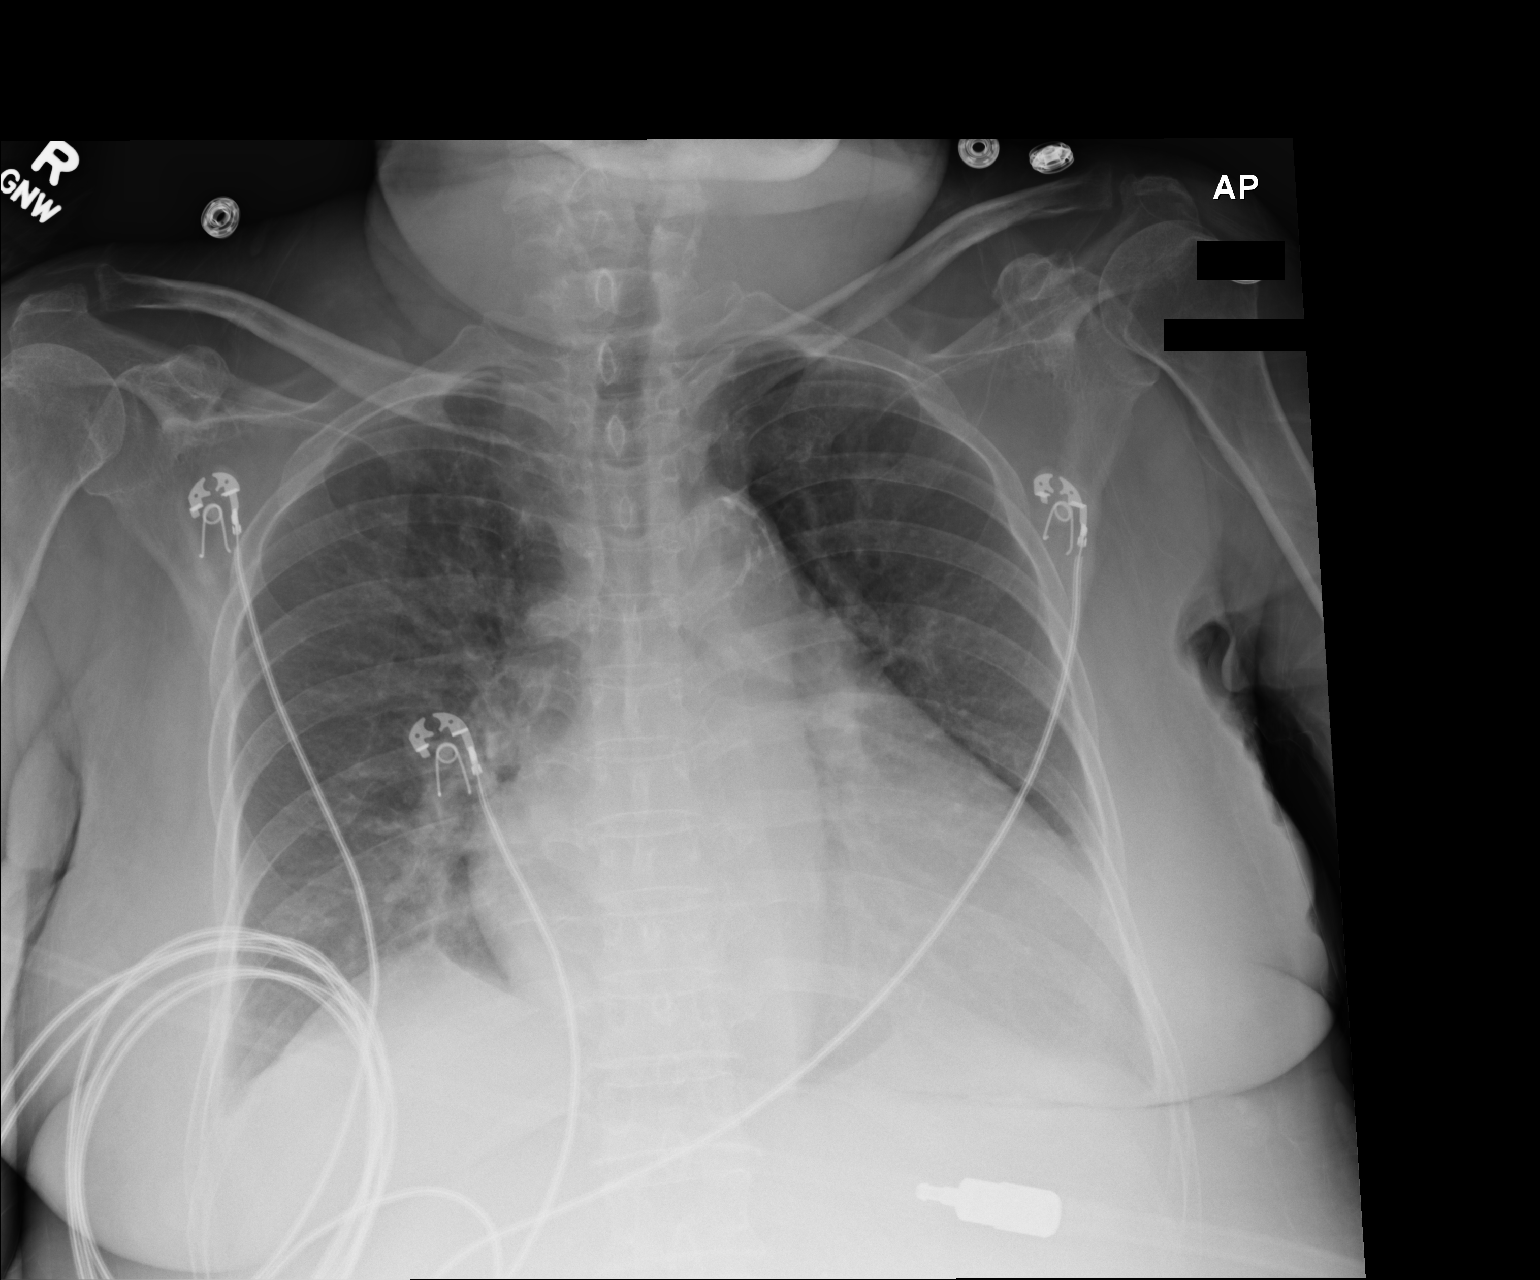

[1 of 1 positions shown; findings below may reference images not displayed]

FINDINGS: There is tenting of the diaphragm. There is bilateral mild
interstitial thickening. There is no focal parenchymal opacity,
pleural effusion, or pneumothorax. There is stable cardiomegaly.
There is thoracic aortic atherosclerosis.

The osseous structures are unremarkable.
IMPRESSION: Cardiomegaly with pulmonary vascular congestion.

## 2015-06-04 IMAGING — US US RENAL
1 series · 14 of 25 positions shown · non-contrast
Comparison: Abdomen MRI 03/12/2013 and earlier.

CLINICAL DATA: 63-year-old female with acute renal injury. Initial
encounter.

EXAM:
RENAL/URINARY TRACT ULTRASOUND COMPLETE

[Series 1: us renal · 0.29mm/px · 14 of 38 slices shown]
[im 1/38]
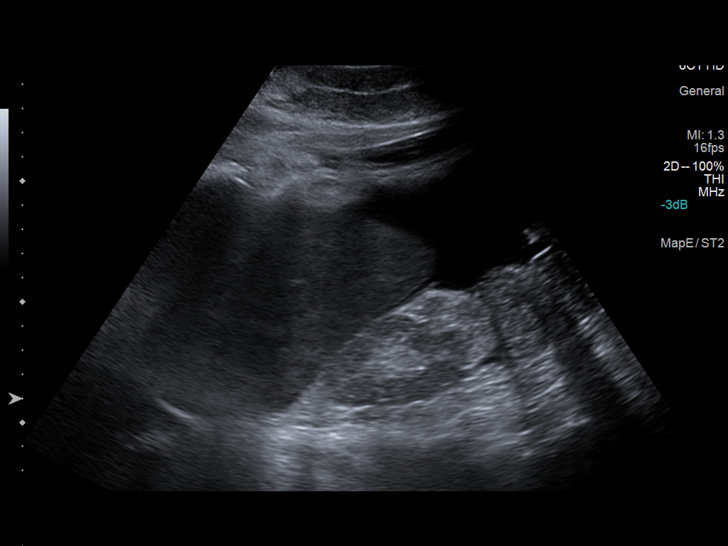
[im 4/38]
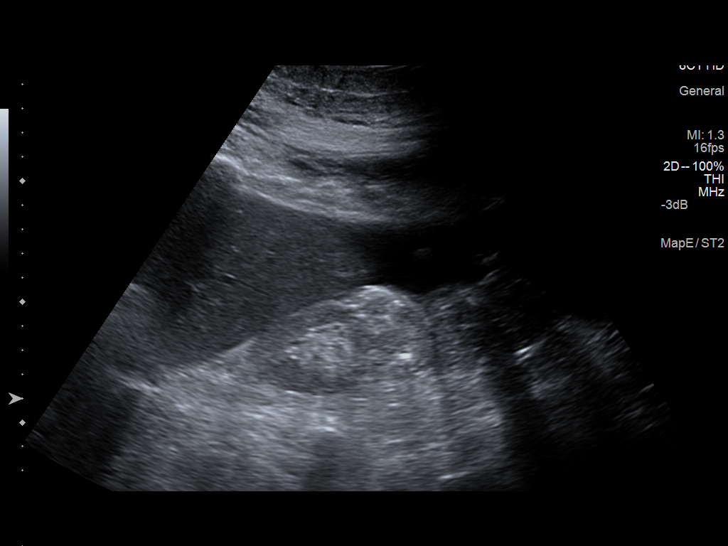
[im 7/38]
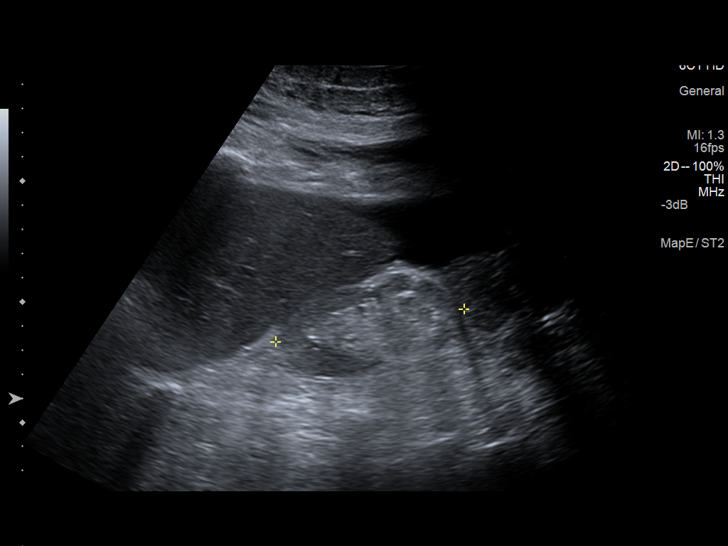
[im 10/38]
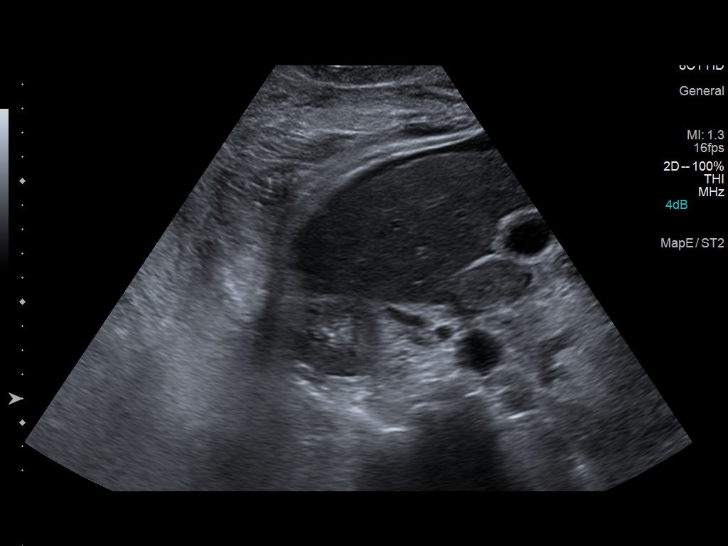
[im 13/38]
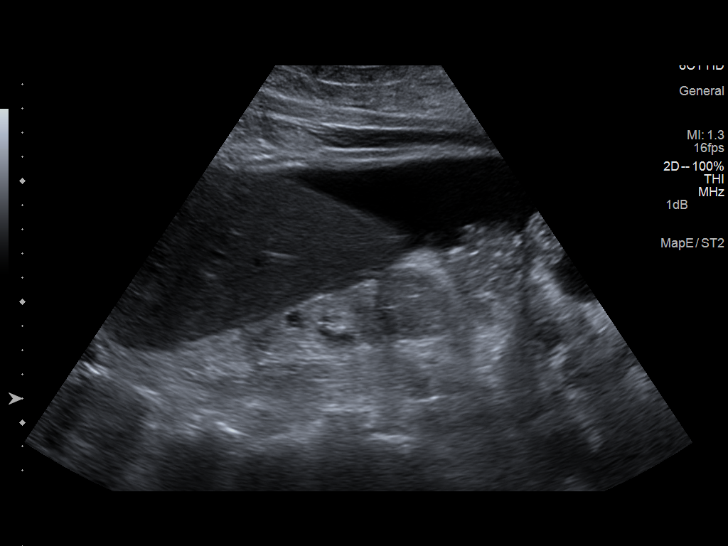
[im 14/38]
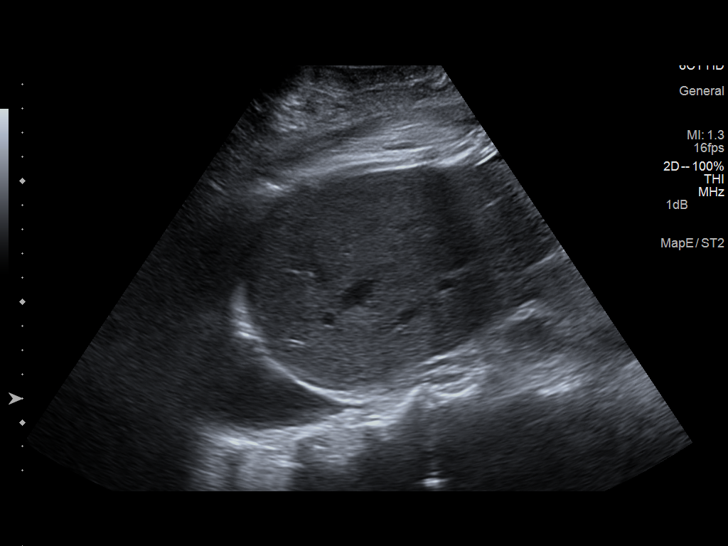
[im 17/38]
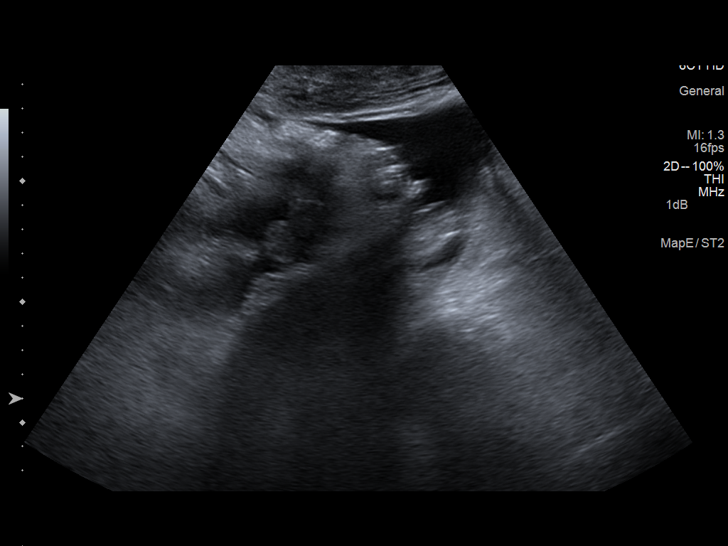
[im 21/38]
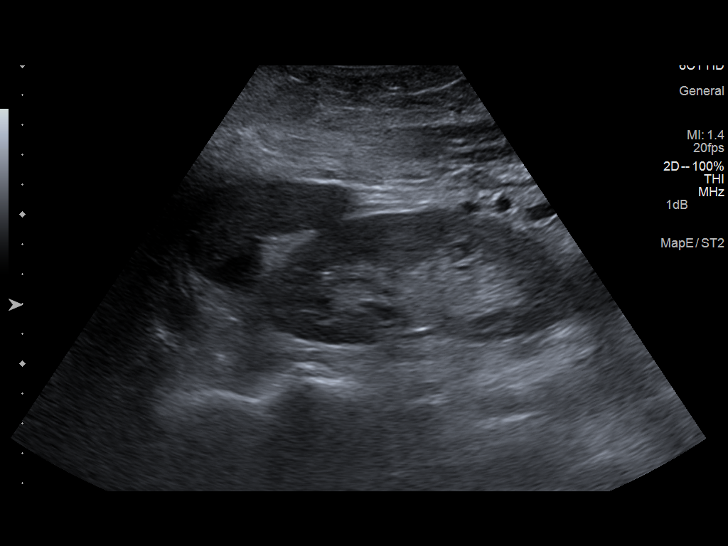
[im 24/38]
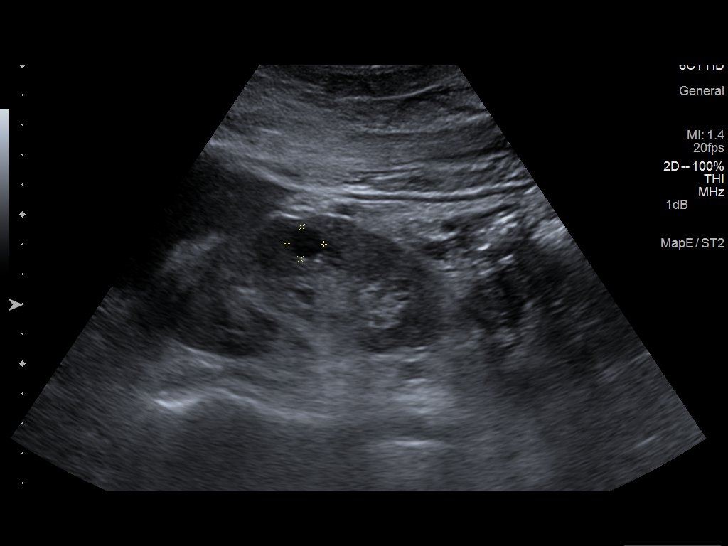
[im 25/38]
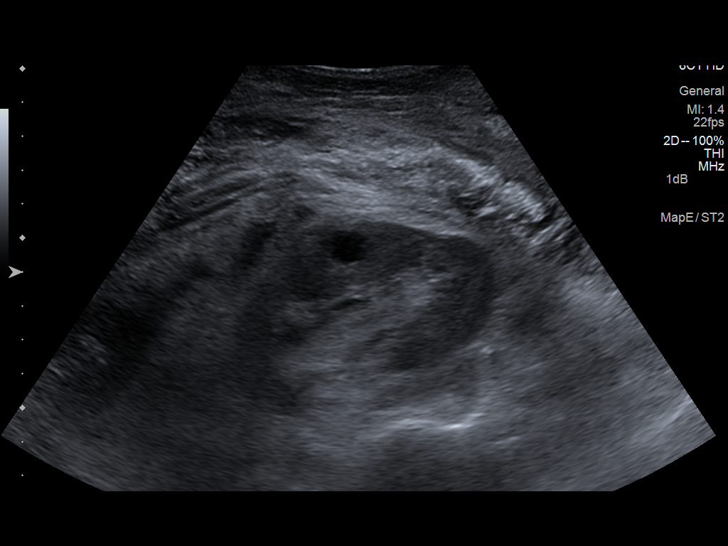
[im 28/38]
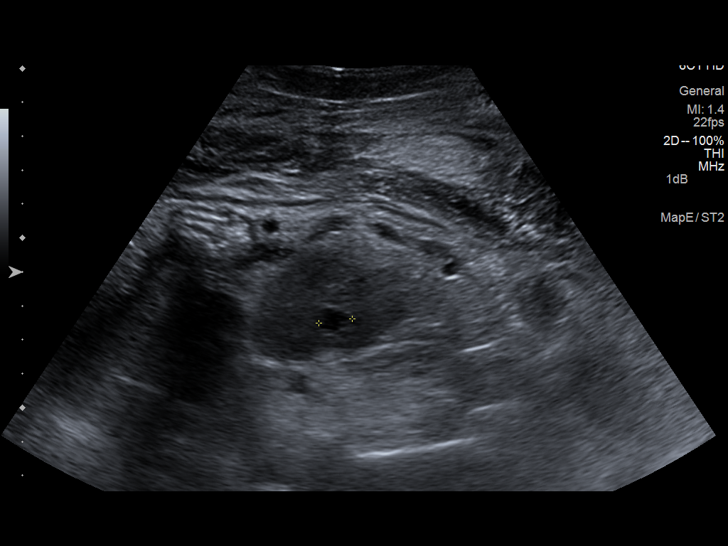
[im 31/38]
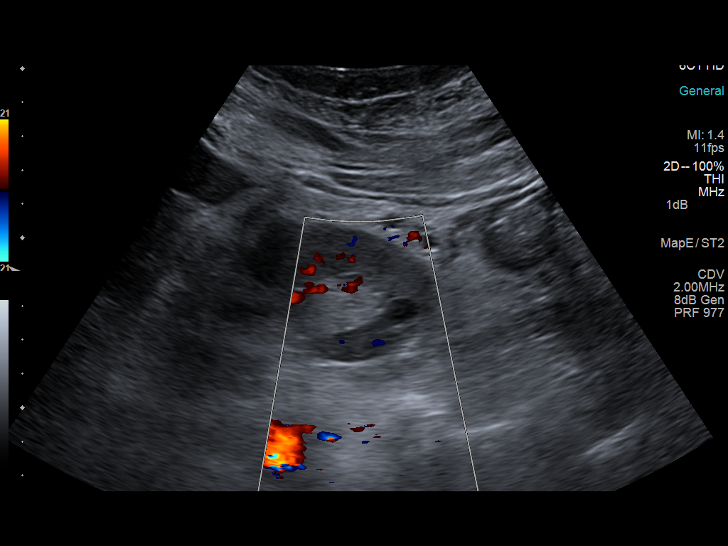
[im 34/38]
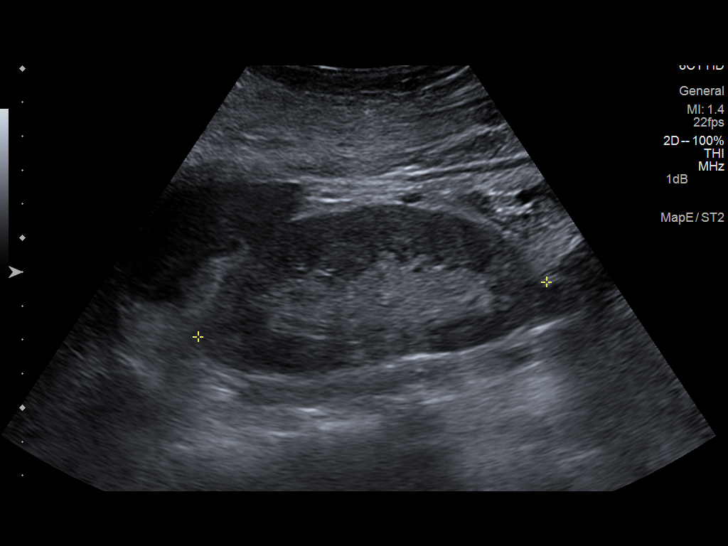
[im 38/38]
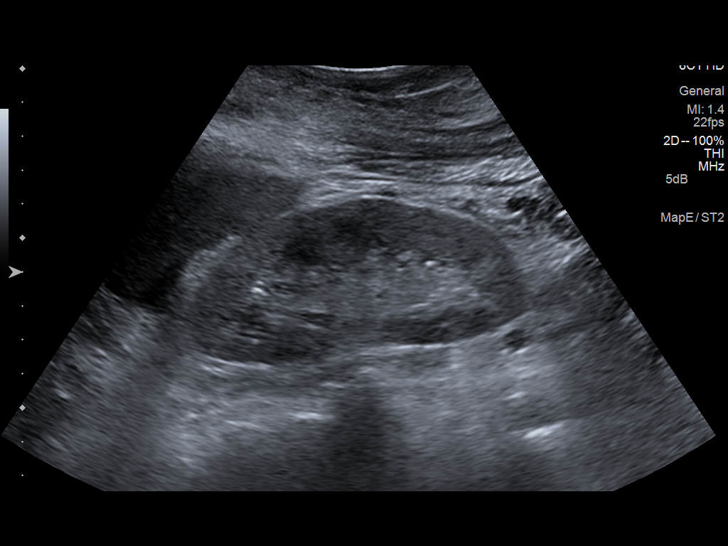

[14 of 25 positions shown; findings below may reference images not displayed]

FINDINGS: Right Kidney:

Length: 7.9 cm.  Echogenic cortex.  No hydronephrosis or renal mass.

Left Kidney:

Length: 10.6 cm. No hydronephrosis. Cortical echogenicity not as
pronounced as that on the right. Small simple appearing cortical
cysts measure up to 12 mm and are stable.

Bladder:

Not visualized, reportedly a Foley catheter is in place.

Other findings: Small volume ascites is new. Right pleural effusion,
which was on prior studies.
IMPRESSION: 1. Chronic medical renal disease perhaps greater on the right. No
acute renal findings.
2. New small volume ascites.  Chronic right pleural effusion.

## 2015-06-21 IMAGING — US IR FLUORO GUIDE CV LINE*R*
1 series · 1 of 1 positions shown · non-contrast
Comparison: none

INDICATION: History of CHF, cardiomyopathy, stage 4 chronic kidney disease,
admitted with bacteremia. Patient in need of central venous access
for continuation of the antibiotics. Request made to place tunneled
jugular line due to need to preserve arm veins for potential
dialysis access.

[Series 1: ir fluoro guide cv line*right* · 1 of 1 slices shown]
[im 1/1]
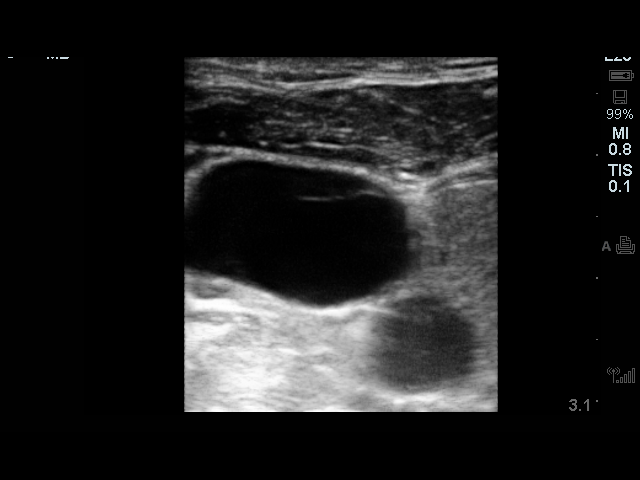

[1 of 1 positions shown; findings below may reference images not displayed]

EXAM:
TUNNELED PICC PLACEMENT WITH ULTRASOUND AND FLUOROSCOPIC GUIDANCE

MEDICATIONS:
The patient is currently admitted to the hospital and receiving
intravenous antibiotics. The IV antibiotic was given in an
appropriate time interval prior to skin puncture.

CONTRAST:  None

ANESTHESIA/SEDATION:
None

FLUOROSCOPY TIME:  12 seconds (3 mGy)

COMPLICATIONS:
None immediate



After creating a small venotomy incision, a micropuncture kit was
utilized to access the right internal jugular vein under direct,
real-time ultrasound guidance after the overlying soft tissues were
anesthetized with 1% lidocaine with epinephrine. Ultrasound image
documentation was performed. The microwire was kinked to measure
appropriate catheter length. The micropuncture sheath was exchanged
for a peel-away sheath. A single-lumen power injectable PICC
measuring 21 cm from tip to cuff was tunneled in a retrograde
fashion from the anterior chest wall to the venotomy incision.

The catheter was then placed through the peel-away sheath with tips
ultimately positioned within the superior aspect of the right
atrium. Final catheter positioning was confirmed and documented with
a spot radiographic image. The catheter aspirates and flushes
normally

The catheter exit site was secured with a 0-Prolene retention
suture. The venotomy incision was closed with Dermabond and
Seanbillyjay. Dressings were applied. The patient tolerated the
procedure well without immediate post procedural complication.
IMPRESSION: Successful placement of 21 cm tip to cuff tunneled PICC via the
right internal jugular vein with tips terminating within the
superior aspect of the right atrium. The catheter is ready for
immediate use.
